# Patient Record
Sex: Male | Born: 1965 | Race: White | Hispanic: No | Smoking: Current every day smoker
Health system: Southern US, Community
[De-identification: ages and names within clinical notes are randomized; demographics above are authoritative.]

## PROBLEM LIST (undated history)

## (undated) DIAGNOSIS — I214 Non-ST elevation (NSTEMI) myocardial infarction: Secondary | ICD-10-CM

## (undated) DIAGNOSIS — Z720 Tobacco use: Secondary | ICD-10-CM

## (undated) DIAGNOSIS — I2510 Atherosclerotic heart disease of native coronary artery without angina pectoris: Secondary | ICD-10-CM

## (undated) DIAGNOSIS — K219 Gastro-esophageal reflux disease without esophagitis: Secondary | ICD-10-CM

## (undated) DIAGNOSIS — K859 Acute pancreatitis without necrosis or infection, unspecified: Secondary | ICD-10-CM

## (undated) DIAGNOSIS — Z8719 Personal history of other diseases of the digestive system: Secondary | ICD-10-CM

## (undated) DIAGNOSIS — F4310 Post-traumatic stress disorder, unspecified: Secondary | ICD-10-CM

## (undated) DIAGNOSIS — I209 Angina pectoris, unspecified: Secondary | ICD-10-CM

---

## 2006-08-04 ENCOUNTER — Emergency Department (HOSPITAL_COMMUNITY): Admission: EM | Admit: 2006-08-04 | Discharge: 2006-08-04 | Payer: Self-pay | Admitting: Emergency Medicine

## 2009-06-21 ENCOUNTER — Emergency Department (HOSPITAL_COMMUNITY): Admission: EM | Admit: 2009-06-21 | Discharge: 2009-06-21 | Payer: Self-pay | Admitting: Emergency Medicine

## 2009-10-30 ENCOUNTER — Emergency Department (HOSPITAL_COMMUNITY): Admission: EM | Admit: 2009-10-30 | Discharge: 2009-10-30 | Payer: Self-pay | Admitting: Emergency Medicine

## 2010-05-26 LAB — CBC
HCT: 49.3 % (ref 39.0–52.0)
Hemoglobin: 17.2 g/dL — ABNORMAL HIGH (ref 13.0–17.0)
MCV: 100.1 fL — ABNORMAL HIGH (ref 78.0–100.0)
RBC: 4.92 MIL/uL (ref 4.22–5.81)
RDW: 13.2 % (ref 11.5–15.5)
WBC: 7.3 10*3/uL (ref 4.0–10.5)

## 2010-05-26 LAB — COMPREHENSIVE METABOLIC PANEL
ALT: 29 U/L (ref 0–53)
Alkaline Phosphatase: 78 U/L (ref 39–117)
BUN: 4 mg/dL — ABNORMAL LOW (ref 6–23)
CO2: 26 mEq/L (ref 19–32)
Chloride: 107 mEq/L (ref 96–112)
GFR calc non Af Amer: >60 mL/min (ref >60)
Glucose, Bld: 104 mg/dL — ABNORMAL HIGH (ref 70–99)
Potassium: 3.8 mEq/L (ref 3.5–5.1)
Sodium: 141 mEq/L (ref 135–145)
Total Bilirubin: 0.7 mg/dL (ref 0.3–1.2)
Total Protein: 6 g/dL (ref 6.0–8.3)

## 2010-05-26 LAB — PROTIME-INR
INR: 0.89 (ref 0.00–1.49)
Prothrombin Time: 12.2 seconds (ref 11.6–15.2)

## 2010-05-31 LAB — POCT I-STAT, CHEM 8
BUN: <3 mg/dL — ABNORMAL LOW (ref 6–23)
Calcium, Ion: 1 mmol/L — ABNORMAL LOW (ref 1.12–1.32)
Chloride: 106 mEq/L (ref 96–112)
HCT: 58 % — ABNORMAL HIGH (ref 39.0–52.0)
Sodium: 141 mEq/L (ref 135–145)

## 2010-05-31 LAB — RAPID URINE DRUG SCREEN, HOSP PERFORMED
Amphetamines: NOT DETECTED
Opiates: NOT DETECTED
Tetrahydrocannabinol: POSITIVE — AB

## 2010-05-31 LAB — ETHANOL: Alcohol, Ethyl (B): 120 mg/dL — ABNORMAL HIGH (ref 0–10)

## 2012-12-10 ENCOUNTER — Emergency Department (HOSPITAL_COMMUNITY): Payer: Self-pay

## 2012-12-10 ENCOUNTER — Encounter (HOSPITAL_COMMUNITY): Payer: Self-pay | Admitting: Emergency Medicine

## 2012-12-10 ENCOUNTER — Emergency Department (HOSPITAL_COMMUNITY)
Admission: EM | Admit: 2012-12-10 | Discharge: 2012-12-11 | Disposition: A | Discharge status: Home / Self Care | Payer: Self-pay | Attending: Emergency Medicine | Admitting: Emergency Medicine

## 2012-12-10 DIAGNOSIS — K089 Disorder of teeth and supporting structures, unspecified: Secondary | ICD-10-CM | POA: Insufficient documentation

## 2012-12-10 DIAGNOSIS — K0889 Other specified disorders of teeth and supporting structures: Secondary | ICD-10-CM

## 2012-12-10 DIAGNOSIS — F17200 Nicotine dependence, unspecified, uncomplicated: Secondary | ICD-10-CM | POA: Insufficient documentation

## 2012-12-10 DIAGNOSIS — R21 Rash and other nonspecific skin eruption: Secondary | ICD-10-CM | POA: Insufficient documentation

## 2012-12-10 DIAGNOSIS — L0201 Cutaneous abscess of face: Secondary | ICD-10-CM | POA: Insufficient documentation

## 2012-12-10 DIAGNOSIS — R000 Tachycardia, unspecified: Secondary | ICD-10-CM | POA: Insufficient documentation

## 2012-12-10 DIAGNOSIS — L03211 Cellulitis of face: Secondary | ICD-10-CM | POA: Insufficient documentation

## 2012-12-10 DIAGNOSIS — R220 Localized swelling, mass and lump, head: Secondary | ICD-10-CM | POA: Insufficient documentation

## 2012-12-10 LAB — POCT I-STAT, CHEM 8
Chloride: 100 mEq/L (ref 96–112)
HCT: 55 % — ABNORMAL HIGH (ref 39.0–52.0)
Hemoglobin: 18.7 g/dL — ABNORMAL HIGH (ref 13.0–17.0)
Potassium: 3.8 mEq/L (ref 3.5–5.1)
Sodium: 138 mEq/L (ref 135–145)

## 2012-12-10 LAB — CBC WITH DIFFERENTIAL/PLATELET
Basophils Relative: 0 % (ref 0–1)
Eosinophils Absolute: 0.2 10*3/uL (ref 0.0–0.7)
Lymphs Abs: 1.6 10*3/uL (ref 0.7–4.0)
MCH: 33.7 pg (ref 26.0–34.0)
MCHC: 35 g/dL (ref 30.0–36.0)
Neutro Abs: 8.2 10*3/uL — ABNORMAL HIGH (ref 1.7–7.7)
Neutrophils Relative %: 71 % (ref 43–77)
Platelets: 236 10*3/uL (ref 150–400)
RBC: 4.92 MIL/uL (ref 4.22–5.81)

## 2012-12-10 MED ORDER — CLINDAMYCIN PHOSPHATE 600 MG/50ML IV SOLN
600.0000 mg | Freq: Once | INTRAVENOUS | Status: AC
  Administered 2012-12-10: 600 mg via INTRAVENOUS
  Filled 2012-12-10: qty 50

## 2012-12-10 MED ORDER — MORPHINE SULFATE 4 MG/ML IJ SOLN
4.0000 mg | Freq: Once | INTRAMUSCULAR | Status: AC
  Administered 2012-12-10: 4 mg via INTRAVENOUS
  Filled 2012-12-10: qty 1

## 2012-12-10 MED ORDER — IOHEXOL 300 MG/ML  SOLN
100.0000 mL | Freq: Once | INTRAMUSCULAR | Status: AC | PRN
  Administered 2012-12-10: 100 mL via INTRAVENOUS

## 2012-12-10 NOTE — ED Notes (Signed)
Pt has tooth on L upper side that is abcessed. Has an appt with dentist in the morning but face is grossly swollen on entire L side.

## 2012-12-10 NOTE — ED Provider Notes (Signed)
CSN: [id number]     Arrival date & time 12/10/12  1855 History   First MD Initiated Contact with Patient 12/10/12 2136     Chief Complaint  Patient presents with  . Facial Swelling  . Dental Pain   (Consider location/radiation/quality/duration/timing/severity/associated sxs/prior Treatment) HPI  47 year old male presents complaining of dental pain and facial swelling. Patient reports dental pain to his left upper central incisor ongoing for about a week. Pain is a sharp throbbing sensation, non radiating, worsening with chewing. He has been trying to take Advil initially with some relief. He is scheduled to followup with a dentist tomorrow. However within the past 24 hours he has had significant swelling to his left side of face extending towards his left eye. Patient currently states his dental pain is minimal, rated as 5/10, however his face is tight and mildly itchy.   Pt report he has tried home remedy of applying liquid advil onto Qtip and rub against affected tooth.  Initially it helped alleviate the pain but now it doesn't.  Patient otherwise denies fever, eye pain, ear pain, sore throat, neck pain. No other complaints. Patient is a smoker.  History reviewed. No pertinent past medical history. History reviewed. No pertinent past surgical history. No family history on file. History  Substance Use Topics  . Smoking status: Current Every Day Smoker -- 0.50 packs/day    Types: Cigarettes  . Smokeless tobacco: Not on file  . Alcohol Use: Yes     Comment: 3 beers/day    Review of Systems  Constitutional: Negative for fever.  HENT: Positive for dental problem. Negative for ear pain, trouble swallowing and neck pain.   Eyes: Negative for pain.  Respiratory: Negative for chest tightness and shortness of breath.   Skin: Positive for rash.  Neurological: Negative for headaches.    Allergies  Review of patient's allergies indicates no known allergies.  Home Medications  No current  outpatient prescriptions on file. BP 140/99  Pulse 113  Temp(Src) 99.2 F (37.3 C)  SpO2 100% Physical Exam  Nursing note and vitals reviewed. Constitutional: He appears well-developed and well-nourished. No distress.  HENT:  Patient with moderate left-sided facial swelling with significant left infraorbital edema, mildly tender to palpation.  On oral exam, patient has moderate dental decay with significant decay to left upper central incisor, tenderness to palpation but no obvious Adjacent abscess. No trismus. No tongue swelling or airway compromise. No evidence of peritonsillar abscess.  Neck: Normal range of motion. Neck supple. No JVD present.  Cardiovascular:  Mild tachycardia without murmurs, rubs, or gallops noted    ED Course  Procedures (including critical care time)  12:19 AM Pt with dental pain and now L facial swelling.  Maxillofacial CT show evidence of cellulitis without abscess.  No eye involvement, airway is intact.  Pt is scheduled to f/u with dentist tomorrow.  IV clindamycin given here.  Will discharge with pain medication and abx.  Pt to return in 48 hrs for skin recheck, return sooner if sxs worsen.  Otherwise pt stable for discharge.  Pt agrees with plan.  Care discussed with attending.    Labs Review Labs Reviewed  CBC WITH DIFFERENTIAL - Abnormal; Notable for the following:    WBC 11.5 (*)    Neutro Abs 8.2 (*)    Monocytes Absolute 1.4 (*)    All other components within normal limits  POCT I-STAT, CHEM 8 - Abnormal; Notable for the following:    Hemoglobin 18.7 (*)  HCT 55.0 (*)    All other components within normal limits   Imaging Review Ct Maxillofacial W/cm  12/10/2012   *RADIOLOGY REPORT*  Clinical Data: Facial swelling on the left  CT MAXILLOFACIAL WITH CONTRAST  Technique:  Multidetector CT imaging of the maxillofacial structures was performed with intravenous contrast. Multiplanar CT image reconstructions were also generated.  Contrast:  OMNIPAQUE IOHEXOL 300 MG/ML  SOLN  Comparison:  None available.  Findings: Multiple dental caries are noted.  There is diffuse soft tissue swelling throughout the left face with inflammatory fat stranding within the left masticator space, suggestive of infection/inflammation. Swelling extends into the left periorbital region.  No loculated collection to suggest abscess identified. The oral cavity is within normal limits without loculated fluid collection or mass lesion.  Palatine tonsils are within normal limits.  Parapharyngeal fat is preserved.  Airway is midline and patent.  No pathologically enlarged lymph nodes are identified within the neck.  No maxillofacial fracture identified.  Orbits are within normal limits.  Mild circumferential polypoid opacity is present within the left maxillary sinus.  Otherwise, paranasal sinuses are clear.  There is a small left mastoid effusion.  IMPRESSION: 1.  Dental caries with left facial swelling, suggestive of cellulitis.  No loculated rim enhancing fluid collections identified to suggest abscess formation. 2.  Mild left maxillary sinus disease with small left mastoid effusion.  This is likely reactive to the left facial inflammatory process.   Original Report Authenticated By: Rise Mu, M.D.    MDM   1. Diffuse cellulitis of face   2. Pain, dental    BP 150/105  Pulse 90  Temp(Src) 98 F (36.7 C) (Oral)  Resp 20  SpO2 98%  I have reviewed nursing notes and vital signs. I personally reviewed the imaging tests through PACS system  I reviewed available ER/hospitalization records thought the EMR     Fayrene Helper, PA-C 12/11/12 0025

## 2012-12-11 MED ORDER — CLINDAMYCIN HCL 150 MG PO CAPS: 150.0000 mg | ORAL_CAPSULE | Freq: Four times a day (QID) | ORAL | Status: DC

## 2012-12-11 MED ORDER — HYDROCODONE-ACETAMINOPHEN 5-325 MG PO TABS: 2.0000 | ORAL_TABLET | ORAL | Status: DC | PRN

## 2012-12-11 NOTE — ED Provider Notes (Signed)
Medical screening examination/treatment/procedure(s) were performed by non-physician practitioner and as supervising physician I was immediately available for consultation/collaboration.  Gerhard Munch, MD 12/11/12 [phone]

## 2013-01-15 ENCOUNTER — Emergency Department (HOSPITAL_COMMUNITY)
Admission: EM | Admit: 2013-01-15 | Discharge: 2013-01-15 | Disposition: A | Discharge status: Home / Self Care | Payer: Self-pay | Attending: Emergency Medicine | Admitting: Emergency Medicine

## 2013-01-15 ENCOUNTER — Encounter (HOSPITAL_COMMUNITY): Payer: Self-pay | Admitting: Emergency Medicine

## 2013-01-15 DIAGNOSIS — R197 Diarrhea, unspecified: Secondary | ICD-10-CM | POA: Insufficient documentation

## 2013-01-15 DIAGNOSIS — K644 Residual hemorrhoidal skin tags: Secondary | ICD-10-CM | POA: Insufficient documentation

## 2013-01-15 DIAGNOSIS — Z79899 Other long term (current) drug therapy: Secondary | ICD-10-CM | POA: Insufficient documentation

## 2013-01-15 DIAGNOSIS — F17200 Nicotine dependence, unspecified, uncomplicated: Secondary | ICD-10-CM | POA: Insufficient documentation

## 2013-01-15 DIAGNOSIS — R109 Unspecified abdominal pain: Secondary | ICD-10-CM | POA: Insufficient documentation

## 2013-01-15 DIAGNOSIS — R112 Nausea with vomiting, unspecified: Secondary | ICD-10-CM | POA: Insufficient documentation

## 2013-01-15 LAB — COMPREHENSIVE METABOLIC PANEL
Albumin: 4.5 g/dL (ref 3.5–5.2)
Alkaline Phosphatase: 120 U/L — ABNORMAL HIGH (ref 39–117)
BUN: 11 mg/dL (ref 6–23)
CO2: 20 mEq/L (ref 19–32)
Chloride: 99 mEq/L (ref 96–112)
Glucose, Bld: 121 mg/dL — ABNORMAL HIGH (ref 70–99)
Potassium: 4.8 mEq/L (ref 3.5–5.1)
Total Bilirubin: 0.4 mg/dL (ref 0.3–1.2)

## 2013-01-15 LAB — URINALYSIS, ROUTINE W REFLEX MICROSCOPIC
Ketones, ur: 15 mg/dL — AB
Nitrite: NEGATIVE
pH: 5.5 (ref 5.0–8.0)

## 2013-01-15 LAB — LIPASE, BLOOD: Lipase: 27 U/L (ref 11–59)

## 2013-01-15 LAB — CBC WITH DIFFERENTIAL/PLATELET
Basophils Relative: 1 % (ref 0–1)
Hemoglobin: 19.9 g/dL — ABNORMAL HIGH (ref 13.0–17.0)
Lymphocytes Relative: 11 % — ABNORMAL LOW (ref 12–46)
Lymphs Abs: 1.4 10*3/uL (ref 0.7–4.0)
Monocytes Relative: 7 % (ref 3–12)
Neutro Abs: 10.1 10*3/uL — ABNORMAL HIGH (ref 1.7–7.7)
Neutrophils Relative %: 81 % — ABNORMAL HIGH (ref 43–77)
RBC: 5.77 MIL/uL (ref 4.22–5.81)
WBC: 12.5 10*3/uL — ABNORMAL HIGH (ref 4.0–10.5)

## 2013-01-15 LAB — URINE MICROSCOPIC-ADD ON

## 2013-01-15 MED ORDER — ONDANSETRON HCL 4 MG/2ML IJ SOLN
4.0000 mg | INTRAMUSCULAR | Status: AC
  Administered 2013-01-15: 4 mg via INTRAVENOUS
  Filled 2013-01-15: qty 2

## 2013-01-15 MED ORDER — ONDANSETRON HCL 4 MG/2ML IJ SOLN
4.0000 mg | Freq: Once | INTRAMUSCULAR | Status: AC
  Administered 2013-01-15: 4 mg via INTRAVENOUS
  Filled 2013-01-15: qty 2

## 2013-01-15 MED ORDER — SODIUM CHLORIDE 0.9 % IV BOLUS (SEPSIS)
1000.0000 mL | Freq: Once | INTRAVENOUS | Status: AC
  Administered 2013-01-15: 1000 mL via INTRAVENOUS

## 2013-01-15 MED ORDER — ONDANSETRON 4 MG PO TBDP: 4.0000 mg | ORAL_TABLET | Freq: Three times a day (TID) | ORAL | Status: DC | PRN

## 2013-01-15 MED ORDER — DICYCLOMINE HCL 20 MG PO TABS: 20.0000 mg | ORAL_TABLET | Freq: Two times a day (BID) | ORAL | Status: DC

## 2013-01-15 MED ORDER — MORPHINE SULFATE 4 MG/ML IJ SOLN
4.0000 mg | Freq: Once | INTRAMUSCULAR | Status: AC
  Administered 2013-01-15: 4 mg via INTRAVENOUS
  Filled 2013-01-15: qty 1

## 2013-01-15 NOTE — ED Notes (Signed)
Pt reports woke up this morning, vomiting phlem, had a bowel movement which he thinks had blood in it, very watery. Abdominal pain 7/10. Reports drinks 2-3 beers ETOH daily x20 years. Smokes 1 pack/day.

## 2013-01-15 NOTE — ED Provider Notes (Signed)
Medical screening examination/treatment/procedure(s) were performed by non-physician practitioner and as supervising physician I was immediately available for consultation/collaboration.  EKG Interpretation   None         Junius Argyle, MD 01/15/13 1525

## 2013-01-15 NOTE — ED Notes (Signed)
Pt tolerated fluids without difficulty 

## 2013-01-15 NOTE — ED Notes (Signed)
Pt given urinal and made aware of need for UA instructed to call when able to void

## 2013-01-15 NOTE — Progress Notes (Signed)
[facility] provided pt with a list of primary care resources.  °

## 2013-01-15 NOTE — ED Provider Notes (Signed)
CSN: [id number]     Arrival date & time 01/15/13  1012 History   First MD Initiated Contact with Patient 01/15/13 1029     Chief Complaint  Patient presents with  . GI Bleeding  . Emesis   (Consider location/radiation/quality/duration/timing/severity/associated sxs/prior Treatment) The history is provided by the patient and medical records.   This is a 47 year old male with no significant past history, presenting to the ED for vomiting and possible GI bleed. Patient states he woke up this morning had one episode of nonbloody, nonbilious emesis. He then had a watery bowel movement which he thinks had some blood in it. He denies any gross blood.  Has had intermittent loose stools over the past 2 weeks but today was the first watery stool.  Pt was recently on clindamycin for dental abscess-- states loose stools started around time he started taking abx.  Has been off meds for approx 1 week but continues to have loose stools. No prior hx of GI bleed.  Not currently on any anticoagulants.  No recent travel.  States he still feels like he needs to have a bowel movement and had some lower abdominal cramping. Pt does admit to daily EtOH use for the past 20 years, approx 2-3 beers daily.  Pt is a daily smoker, approx 1 PPD.    History reviewed. No pertinent past medical history. History reviewed. No pertinent past surgical history. History reviewed. No pertinent family history. History  Substance Use Topics  . Smoking status: Current Every Day Smoker -- 1.00 packs/day for 25 years    Types: Cigarettes  . Smokeless tobacco: Not on file  . Alcohol Use: Yes     Comment: 3 beers/day    Review of Systems  Gastrointestinal: Positive for vomiting and diarrhea.  All other systems reviewed and are negative.    Allergies  Review of patient's allergies indicates no known allergies.  Home Medications   Current Outpatient Rx  Name  Route  Sig  Dispense  Refill  . Aspirin-Acetaminophen-Caffeine (GOODY  HEADACHE PO)   Oral   Take 1 application by mouth every 6 (six) hours as needed (pain).         . clindamycin (CLEOCIN) 150 MG capsule   Oral   Take 1 capsule (150 mg total) by mouth every 6 (six) hours.   28 capsule   0   . HYDROcodone-acetaminophen (NORCO/VICODIN) 5-325 MG per tablet   Oral   Take 2 tablets by mouth every 4 (four) hours as needed for pain.   16 tablet   0   . naproxen sodium (ANAPROX) 220 MG tablet   Oral   Take 220 mg by mouth 2 (two) times daily with a meal.          BP 145/113  Pulse 134  Temp(Src) 98.1 F (36.7 C) (Oral)  Resp 16  SpO2 95%  Physical Exam  Nursing note and vitals reviewed. Constitutional: He is oriented to person, place, and time. He appears well-developed and well-nourished. No distress.  HENT:  Head: Normocephalic and atraumatic.  Mouth/Throat: Uvula is midline, oropharynx is clear and moist and mucous membranes are normal. No oropharyngeal exudate, posterior oropharyngeal edema, posterior oropharyngeal erythema or tonsillar abscesses.  Mildly dry mucus membranes  Eyes: Conjunctivae and EOM are normal. Pupils are equal, round, and reactive to light.  Neck: Normal range of motion. Neck supple.  Cardiovascular: Normal rate, regular rhythm and normal heart sounds.   Pulmonary/Chest: Effort normal and breath sounds normal. No respiratory  distress. He has no wheezes.  Abdominal: Soft. Bowel sounds are normal. There is no tenderness. There is no guarding and no CVA tenderness.  Abdomen soft, non-distended, cramping sensation but no focal TTP  Genitourinary: Rectal exam shows external hemorrhoid. Rectal exam shows no internal hemorrhoid, no fissure, no mass and no tenderness. Guaiac negative stool.  2 non-bleeding, non-thrombosed external hemorrhoids; no gross blood on DRE; FOBT negative  Musculoskeletal: Normal range of motion.  Neurological: He is alert and oriented to person, place, and time.  Skin: Skin is warm and dry. He is not  diaphoretic.  Psychiatric: He has a normal mood and affect.    ED Course  Procedures (including critical care time)  Labs Review Labs Reviewed  CBC WITH DIFFERENTIAL - Abnormal; Notable for the following:    WBC 12.5 (*)    Hemoglobin 19.9 (*)    HCT 55.5 (*)    MCH 34.5 (*)    Neutrophils Relative % 81 (*)    Neutro Abs 10.1 (*)    Lymphocytes Relative 11 (*)    All other components within normal limits  COMPREHENSIVE METABOLIC PANEL - Abnormal; Notable for the following:    Sodium 133 (*)    Glucose, Bld 121 (*)    Alkaline Phosphatase 120 (*)    GFR calc non Af Amer 72 (*)    GFR calc Af Amer 84 (*)    All other components within normal limits  URINALYSIS, ROUTINE W REFLEX MICROSCOPIC - Abnormal; Notable for the following:    Color, Urine AMBER (*)    Specific Gravity, Urine 1.035 (*)    Bilirubin Urine SMALL (*)    Ketones, ur 15 (*)    Protein, ur 30 (*)    All other components within normal limits  URINE MICROSCOPIC-ADD ON - Abnormal; Notable for the following:    Casts HYALINE CASTS (*)    All other components within normal limits  LIPASE, BLOOD  OCCULT BLOOD X 1 CARD TO LAB, STOOL  OCCULT BLOOD, POC DEVICE   Imaging Review No results found.  EKG Interpretation   None       MDM   1. Diarrhea   2. Nausea and vomiting   3. Abdominal cramping    Pt had additional BM on arrival which nursing staff observed-- no gross blood.  On initial evaluation pt is overall well appearing, abdomen soft, non-distended, no focal TTP.  FOBT negative.  Other labs pending at this time.  Will give IVFB, pain and nausea meds and reassess.  12:58 PM Labs as above.  Pts abdominal cramping has resolved but still feels somewhat nauseated.  Will give additional meds and initiate PO challenge.  1:31 PM Nausea resolved.  Pt tolerating PO without recurrent vomiting.  Pt afebrile, non-toxic appearing, NAD, VS stable- ok for discharge.  Rx bentyl and zofran.  Has been unable to  provide stool sample for C. Diff screen and culture -- will send supplies for home completion if symptoms persist.  Discussed plan with pt, he agreed.  Strict return precautions advised should sx continue or worsen.  Discussed with Dr. Romeo Apple who agrees with assessment and plan of care.  Garlon Hatchet, PA-C 01/15/13 1439

## 2014-01-10 DIAGNOSIS — I214 Non-ST elevation (NSTEMI) myocardial infarction: Secondary | ICD-10-CM

## 2014-01-10 HISTORY — DX: Non-ST elevation (NSTEMI) myocardial infarction: I21.4

## 2014-01-17 ENCOUNTER — Inpatient Hospital Stay (HOSPITAL_COMMUNITY)
Admission: EM | Admit: 2014-01-17 | Discharge: 2014-01-19 | DRG: 247 | Disposition: A | Discharge status: Home / Self Care | Payer: Self-pay | Attending: Cardiology | Admitting: Cardiology

## 2014-01-17 DIAGNOSIS — I200 Unstable angina: Secondary | ICD-10-CM | POA: Diagnosis present

## 2014-01-17 DIAGNOSIS — Z79891 Long term (current) use of opiate analgesic: Secondary | ICD-10-CM

## 2014-01-17 DIAGNOSIS — F17210 Nicotine dependence, cigarettes, uncomplicated: Secondary | ICD-10-CM | POA: Diagnosis present

## 2014-01-17 DIAGNOSIS — I214 Non-ST elevation (NSTEMI) myocardial infarction: Secondary | ICD-10-CM

## 2014-01-17 DIAGNOSIS — I209 Angina pectoris, unspecified: Secondary | ICD-10-CM

## 2014-01-17 DIAGNOSIS — R079 Chest pain, unspecified: Secondary | ICD-10-CM

## 2014-01-17 DIAGNOSIS — R030 Elevated blood-pressure reading, without diagnosis of hypertension: Secondary | ICD-10-CM | POA: Diagnosis present

## 2014-01-17 DIAGNOSIS — Z720 Tobacco use: Secondary | ICD-10-CM

## 2014-01-17 DIAGNOSIS — I25110 Atherosclerotic heart disease of native coronary artery with unstable angina pectoris: Principal | ICD-10-CM | POA: Diagnosis present

## 2014-01-17 DIAGNOSIS — Z79899 Other long term (current) drug therapy: Secondary | ICD-10-CM

## 2014-01-17 DIAGNOSIS — Z7982 Long term (current) use of aspirin: Secondary | ICD-10-CM

## 2014-01-17 HISTORY — DX: Gastro-esophageal reflux disease without esophagitis: K21.9

## 2014-01-17 HISTORY — DX: Tobacco use: Z72.0

## 2014-01-17 HISTORY — DX: Atherosclerotic heart disease of native coronary artery without angina pectoris: I25.10

## 2014-01-17 HISTORY — DX: Non-ST elevation (NSTEMI) myocardial infarction: I21.4

## 2014-01-17 HISTORY — DX: Angina pectoris, unspecified: I20.9

## 2014-01-18 ENCOUNTER — Emergency Department (HOSPITAL_COMMUNITY): Payer: Self-pay

## 2014-01-18 ENCOUNTER — Encounter (HOSPITAL_COMMUNITY)
Admission: EM | Disposition: A | Discharge status: Home / Self Care | Payer: MEDICAID | Source: Home / Self Care | Attending: Cardiology

## 2014-01-18 ENCOUNTER — Encounter (HOSPITAL_COMMUNITY): Payer: Self-pay | Admitting: Emergency Medicine

## 2014-01-18 DIAGNOSIS — I2510 Atherosclerotic heart disease of native coronary artery without angina pectoris: Secondary | ICD-10-CM

## 2014-01-18 DIAGNOSIS — I214 Non-ST elevation (NSTEMI) myocardial infarction: Secondary | ICD-10-CM

## 2014-01-18 DIAGNOSIS — Z720 Tobacco use: Secondary | ICD-10-CM | POA: Diagnosis present

## 2014-01-18 DIAGNOSIS — R7989 Other specified abnormal findings of blood chemistry: Secondary | ICD-10-CM

## 2014-01-18 DIAGNOSIS — I200 Unstable angina: Secondary | ICD-10-CM

## 2014-01-18 HISTORY — PX: CORONARY ANGIOPLASTY WITH STENT PLACEMENT: SHX49

## 2014-01-18 HISTORY — PX: LEFT HEART CATHETERIZATION WITH CORONARY ANGIOGRAM: SHX5451

## 2014-01-18 LAB — COMPREHENSIVE METABOLIC PANEL
ALK PHOS: 113 U/L (ref 39–117)
ALT: 16 U/L (ref 0–53)
ANION GAP: 13 (ref 5–15)
AST: 24 U/L (ref 0–37)
Albumin: 3.3 g/dL — ABNORMAL LOW (ref 3.5–5.2)
BILIRUBIN TOTAL: 0.4 mg/dL (ref 0.3–1.2)
BUN: 8 mg/dL (ref 6–23)
CHLORIDE: 102 meq/L (ref 96–112)
CO2: 24 mEq/L (ref 19–32)
Calcium: 8.8 mg/dL (ref 8.4–10.5)
Creatinine, Ser: 0.97 mg/dL (ref 0.50–1.35)
GFR calc non Af Amer: >90 mL/min (ref >90)
GLUCOSE: 105 mg/dL — AB (ref 70–99)
POTASSIUM: 4.4 meq/L (ref 3.7–5.3)
Sodium: 139 mEq/L (ref 137–147)
Total Protein: 6.3 g/dL (ref 6.0–8.3)

## 2014-01-18 LAB — BASIC METABOLIC PANEL
Anion gap: 10 (ref 5–15)
BUN: 8 mg/dL (ref 6–23)
CALCIUM: 9.1 mg/dL (ref 8.4–10.5)
CO2: 26 mEq/L (ref 19–32)
CREATININE: 1.05 mg/dL (ref 0.50–1.35)
Chloride: 103 mEq/L (ref 96–112)
GFR calc Af Amer: >90 mL/min (ref >90)
GFR, EST NON AFRICAN AMERICAN: 83 mL/min — AB (ref >90)
Glucose, Bld: 105 mg/dL — ABNORMAL HIGH (ref 70–99)
Potassium: 4.8 mEq/L (ref 3.7–5.3)
Sodium: 139 mEq/L (ref 137–147)

## 2014-01-18 LAB — I-STAT TROPONIN, ED: Troponin i, poc: 0.12 ng/mL (ref 0.00–0.08)

## 2014-01-18 LAB — LIPID PANEL
Cholesterol: 154 mg/dL (ref 0–200)
HDL: 45 mg/dL (ref >39)
LDL Cholesterol: 94 mg/dL (ref 0–99)
Total CHOL/HDL Ratio: 3.4 RATIO
Triglycerides: 73 mg/dL (ref <150)
VLDL: 15 mg/dL (ref 0–40)

## 2014-01-18 LAB — HEPARIN LEVEL (UNFRACTIONATED)
Heparin Unfractionated: 0.24 IU/mL — ABNORMAL LOW (ref 0.30–0.70)
Heparin Unfractionated: 0.54 IU/mL (ref 0.30–0.70)

## 2014-01-18 LAB — CBC
HEMATOCRIT: 50.9 % (ref 39.0–52.0)
Hemoglobin: 17.2 g/dL — ABNORMAL HIGH (ref 13.0–17.0)
MCH: 33.6 pg (ref 26.0–34.0)
MCHC: 33.8 g/dL (ref 30.0–36.0)
MCV: 99.4 fL (ref 78.0–100.0)
Platelets: 218 10*3/uL (ref 150–400)
RBC: 5.12 MIL/uL (ref 4.22–5.81)
RDW: 12.5 % (ref 11.5–15.5)
WBC: 8.3 10*3/uL (ref 4.0–10.5)

## 2014-01-18 LAB — PROTIME-INR
INR: 0.98 (ref 0.00–1.49)
Prothrombin Time: 13.1 seconds (ref 11.6–15.2)

## 2014-01-18 LAB — TROPONIN I
TROPONIN I: 0.34 ng/mL — AB (ref <0.30)
Troponin I: <0.3 ng/mL (ref <0.30)
Troponin I: <0.3 ng/mL (ref <0.30)

## 2014-01-18 LAB — TSH: TSH: 1.85 u[IU]/mL (ref 0.350–4.500)

## 2014-01-18 SURGERY — LEFT HEART CATHETERIZATION WITH CORONARY ANGIOGRAM
Anesthesia: LOCAL

## 2014-01-18 MED ORDER — ASPIRIN 81 MG PO CHEW
324.0000 mg | CHEWABLE_TABLET | ORAL | Status: AC
  Administered 2014-01-18: 324 mg via ORAL
  Filled 2014-01-18: qty 4

## 2014-01-18 MED ORDER — SODIUM CHLORIDE 0.9 % IJ SOLN: 3.0000 mL | Freq: Two times a day (BID) | INTRAMUSCULAR | Status: DC

## 2014-01-18 MED ORDER — HEPARIN BOLUS VIA INFUSION
1000.0000 [IU] | Freq: Once | INTRAVENOUS | Status: AC
  Administered 2014-01-18: 1000 [IU] via INTRAVENOUS
  Filled 2014-01-18: qty 1000

## 2014-01-18 MED ORDER — SODIUM CHLORIDE 0.9 % IJ SOLN: 3.0000 mL | INTRAMUSCULAR | Status: DC | PRN

## 2014-01-18 MED ORDER — VERAPAMIL HCL 2.5 MG/ML IV SOLN
INTRAVENOUS | Status: AC
  Filled 2014-01-18: qty 2

## 2014-01-18 MED ORDER — TICAGRELOR 90 MG PO TABS
ORAL_TABLET | ORAL | Status: AC
  Filled 2014-01-18: qty 2

## 2014-01-18 MED ORDER — HEPARIN SODIUM (PORCINE) 1000 UNIT/ML IJ SOLN
INTRAMUSCULAR | Status: AC
  Filled 2014-01-18: qty 1

## 2014-01-18 MED ORDER — METOPROLOL TARTRATE 25 MG PO TABS
25.0000 mg | ORAL_TABLET | Freq: Two times a day (BID) | ORAL | Status: DC
  Administered 2014-01-18 – 2014-01-19 (×3): 25 mg via ORAL
  Filled 2014-01-18 (×5): qty 1

## 2014-01-18 MED ORDER — LIDOCAINE HCL (PF) 1 % IJ SOLN
INTRAMUSCULAR | Status: AC
  Filled 2014-01-18: qty 30

## 2014-01-18 MED ORDER — SODIUM CHLORIDE 0.9 % IV SOLN: 250.0000 mL | INTRAVENOUS | Status: DC | PRN

## 2014-01-18 MED ORDER — BIVALIRUDIN 250 MG IV SOLR
INTRAVENOUS | Status: AC
  Filled 2014-01-18: qty 250

## 2014-01-18 MED ORDER — HEPARIN BOLUS VIA INFUSION
3000.0000 [IU] | Freq: Once | INTRAVENOUS | Status: AC
  Administered 2014-01-18: 3000 [IU] via INTRAVENOUS
  Filled 2014-01-18: qty 3000

## 2014-01-18 MED ORDER — SODIUM CHLORIDE 0.9 % IV SOLN
INTRAVENOUS | Status: AC
Start: 2014-01-18 — End: 2014-01-18
  Administered 2014-01-18: 17:00:00 via INTRAVENOUS

## 2014-01-18 MED ORDER — FENTANYL CITRATE 0.05 MG/ML IJ SOLN
INTRAMUSCULAR | Status: AC
  Filled 2014-01-18: qty 2

## 2014-01-18 MED ORDER — NITROGLYCERIN 0.4 MG SL SUBL: 0.4000 mg | SUBLINGUAL_TABLET | SUBLINGUAL | Status: DC | PRN

## 2014-01-18 MED ORDER — NITROGLYCERIN 1 MG/10 ML FOR IR/CATH LAB
INTRA_ARTERIAL | Status: AC
  Filled 2014-01-18: qty 10

## 2014-01-18 MED ORDER — MIDAZOLAM HCL 2 MG/2ML IJ SOLN
INTRAMUSCULAR | Status: AC
  Filled 2014-01-18: qty 2

## 2014-01-18 MED ORDER — ONDANSETRON HCL 4 MG/2ML IJ SOLN
4.0000 mg | Freq: Four times a day (QID) | INTRAMUSCULAR | Status: DC | PRN
Start: 2014-01-18 — End: 2014-01-18

## 2014-01-18 MED ORDER — ASPIRIN 81 MG PO CHEW
81.0000 mg | CHEWABLE_TABLET | Freq: Every day | ORAL | Status: DC
  Administered 2014-01-19: 11:00:00 81 mg via ORAL
  Filled 2014-01-18 (×2): qty 1

## 2014-01-18 MED ORDER — ATORVASTATIN CALCIUM 80 MG PO TABS
80.0000 mg | ORAL_TABLET | Freq: Every day | ORAL | Status: DC
Start: 2014-01-18 — End: 2014-01-18
  Filled 2014-01-18: qty 1

## 2014-01-18 MED ORDER — ASPIRIN 81 MG PO CHEW
81.0000 mg | CHEWABLE_TABLET | ORAL | Status: DC
Start: 2014-01-19 — End: 2014-01-18

## 2014-01-18 MED ORDER — ACETAMINOPHEN 325 MG PO TABS: 650.0000 mg | ORAL_TABLET | ORAL | Status: DC | PRN

## 2014-01-18 MED ORDER — ACETAMINOPHEN 325 MG PO TABS
650.0000 mg | ORAL_TABLET | ORAL | Status: DC | PRN
Start: 2014-01-18 — End: 2014-01-19
  Administered 2014-01-18 – 2014-01-19 (×3): 650 mg via ORAL
  Filled 2014-01-18 (×3): qty 2

## 2014-01-18 MED ORDER — ONDANSETRON HCL 4 MG/2ML IJ SOLN: 4.0000 mg | Freq: Four times a day (QID) | INTRAMUSCULAR | Status: DC | PRN

## 2014-01-18 MED ORDER — SODIUM CHLORIDE 0.9 % IV SOLN: 1.0000 mL/kg/h | INTRAVENOUS | Status: DC

## 2014-01-18 MED ORDER — TICAGRELOR 90 MG PO TABS
90.0000 mg | ORAL_TABLET | Freq: Two times a day (BID) | ORAL | Status: DC
  Administered 2014-01-19: 05:00:00 90 mg via ORAL
  Filled 2014-01-18 (×3): qty 1

## 2014-01-18 MED ORDER — ASPIRIN 300 MG RE SUPP: 300.0000 mg | RECTAL | Status: AC

## 2014-01-18 MED ORDER — MORPHINE SULFATE 2 MG/ML IJ SOLN: 1.0000 mg | INTRAMUSCULAR | Status: DC | PRN

## 2014-01-18 MED ORDER — ATORVASTATIN CALCIUM 80 MG PO TABS: 80.0000 mg | ORAL_TABLET | Freq: Every day | ORAL | Status: DC

## 2014-01-18 MED ORDER — HEPARIN (PORCINE) IN NACL 2-0.9 UNIT/ML-% IJ SOLN
INTRAMUSCULAR | Status: AC
  Filled 2014-01-18: qty 1000

## 2014-01-18 MED ORDER — HEPARIN (PORCINE) IN NACL 100-0.45 UNIT/ML-% IJ SOLN
950.0000 [IU]/h | INTRAMUSCULAR | Status: DC
  Administered 2014-01-18: 800 [IU]/h via INTRAVENOUS
  Filled 2014-01-18 (×2): qty 250

## 2014-01-18 MED ORDER — ASPIRIN EC 81 MG PO TBEC: 81.0000 mg | DELAYED_RELEASE_TABLET | Freq: Every day | ORAL | Status: DC

## 2014-01-18 NOTE — Interval H&P Note (Signed)
Cath Lab Visit (complete for each Cath Lab visit)  Clinical Evaluation Leading to the Procedure:   ACS: Yes.    Non-ACS:    Anginal Classification: CCS IV  Anti-ischemic medical therapy: No Therapy  Non-Invasive Test Results: No non-invasive testing performed  Prior CABG: No previous CABG      History and Physical Interval Note:  01/18/2014 3:16 PM  Douglas [name]  has presented today for surgery, with the diagnosis of cp  The various methods of treatment have been discussed with the patient and family. After consideration of risks, benefits and other options for treatment, the patient has consented to  Procedure(s): LEFT HEART CATHETERIZATION WITH CORONARY ANGIOGRAM (N/A) as a surgical intervention .  The patient's history has been reviewed, patient examined, no change in status, stable for surgery.  I have reviewed the patient's chart and labs.  Questions were answered to the patient's satisfaction.     [name],[name]

## 2014-01-18 NOTE — CV Procedure (Signed)
Douglas [name] is a 48 y.o. male    [id number] LOCATION:  FACILITY: [facility]  PHYSICIAN: Quay Burow, M.D. [birth year]   DATE OF PROCEDURE:  01/18/2014  DATE OF DISCHARGE:     CARDIAC CATHETERIZATION     History obtained from chart review.[name] is a 48 year old gentleman with positive risk factors who has experienced several weeks of symptoms compatible with accelerated angina. He was seen by Dr. Wynonia Lawman in the emergency room last night. His point-of-care markers were positive though his troponins ended up being negative. There were no acute EKG changes. He was placed on IV heparin and nitroglycerin and presents now for diagnostic coronary arteriography to define his anatomy and rule out an ischemic etiology.   PROCEDURE DESCRIPTION:   The patient was brought to the second floor [facility] cath lab in the postabsorptive state. He was premedicated with Valium 5 mg by mouth, IV Versed and fentanyl. His right wristwas prepped and shaved in usual sterile fashion. Xylocaine 1% was used for local anesthesia. A 6 French sheath was inserted into the right radial artery using standard Seldinger technique. The patient received 4000 units  of heparin  intravenously.  A 5 [place] TIG catheter along with pigtail catheters were used for selective coronary angiography and left ventriculography respectively. Visipaque dye was used for the entirety of the case. Retrograde aortic, left ventricular and pullback pressures were recorded.    HEMODYNAMICS:    AO SYSTOLIC/AO DIASTOLIC: 503/88   LV SYSTOLIC/LV DIASTOLIC: 828/0  ANGIOGRAPHIC RESULTS:   1. Left main; normal  2. LAD; 50% segmental proximal at the takeoff of a moderate-sized diagonal branch. The diagonal branch had a 60% ostial stenosis followed by a 60% segmental proximal stenosis. 3. Left circumflex; nondominant and free of significant disease.  4. Right coronary artery; dominant with a 90% stenosis at the genue 5. Left  ventriculography; RAO left ventriculogram was performed using  25 mL of Visipaque dye at 12 mL/second. 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 We'll proceed with PCI and stenting using drug-eluting stent and Brilenta along with Angiomax.  Procedure description: The patient received Angiomax bolus with an ACT of 518. Total contrast administered to the patient was 150 mL. Using a 6 [place] JR4 guide catheter along with an 014/190 cm long pro-water guidewire and a 2 mm x 12 mm Euphora angioplasty balloon predilatation was performed. Following this a 3.5 mm x 18 mm long Xience drug-eluting stent was then deployed at 16 atm and postdilated with a 3.75 mm x 15 mm long noncompliant balloon at 16 atm (3.8 mm) resulting in reduction of a 90% stenosis in the dominant RCA at the January to 0% residual. 200 g of intra-arterial nitroglycerin was then administered and completion angiography was performed. The guidewire and catheter were removed, the sheath was removed and a TR band was placed on the right wrist to achieve patent hemostasis. Angina continue our distress for 4 hours. The patient will be discharged home in the morning.  Lorretta Harp MD, [facility] 01/18/2014 4:47 PM

## 2014-01-18 NOTE — Progress Notes (Signed)
ANTICOAGULATION CONSULT NOTE - Initial Consult  Pharmacy Consult for heparin Indication: USAP  No Known Allergies  Patient Measurements: Height: 5\' 10"  (177.8 cm) Weight: 145 lb (65.772 kg) IBW/kg (Calculated) : 73  Vital Signs: Temp: 98 F (36.7 C) (11/09 0008) Temp Source: Oral (11/09 0008) BP: 131/81 mmHg (11/09 0115) Pulse Rate: 77 (11/09 0115)  Labs:  Recent Labs  01/18/14 0011 01/18/14 0106  HGB 17.2*  --   HCT 50.9  --   PLT 218  --   CREATININE 1.05  --   TROPONINI  --  <0.30    Estimated Creatinine Clearance: 80.9 mL/min (by C-G formula based on Cr of 1.05).   Medical History: History reviewed. No pertinent past medical history.   Assessment: 48yo male c/o 3wk h/o increasing frequency and severity of CP radiating across chest and down both arms, leaving hands numb, being admitted for further evaluation of USAP, to begin heparin.  Goal of Therapy:  Heparin level 0.3-0.7 units/ml Monitor platelets by anticoagulation protocol: Yes   Plan:  Will give heparin 3000 units IV bolus x1 followed by gtt at 800 units/hr and monitor heparin levels and CBC.  Vernard GamblesVeronda Cristalle Rohm, PharmD, BCPS  01/18/2014,3:28 AM

## 2014-01-18 NOTE — Progress Notes (Signed)
ANTICOAGULATION CONSULT NOTE - Follow Up Consult  Pharmacy Consult for heparin Indication: USAP  No Known Allergies  Patient Measurements: Height: 5\' 10"  (177.8 cm) Weight: 145 lb (65.772 kg) IBW/kg (Calculated) : 73  Vital Signs: BP: 128/75 mmHg (11/09 1048) Pulse Rate: 79 (11/09 1048)  Labs:  Recent Labs  01/18/14 0011 01/18/14 0106 01/18/14 0609 01/18/14 1040  HGB 17.2*  --   --   --   HCT 50.9  --   --   --   PLT 218  --   --   --   HEPARINUNFRC  --   --   --  0.24*  CREATININE 1.05  --  0.97  --   TROPONINI  --  <0.30 0.34*  --     Estimated Creatinine Clearance: 87.6 mL/min (by C-G formula based on Cr of 0.97).   Medical History: Past Medical History  Diagnosis Date  . Tobacco abuse      Assessment: 48yo male c/o 3wk h/o increasing frequency and severity of CP radiating across chest and down both arms, leaving hands numb, admitted for further evaluation of USAP. Currently on Heparin infusion at 800 units/hr. HL this AM was sub-therapeutic at 0.24. RN reports no s/s of bleeding   Goal of Therapy:  Heparin level 0.3-0.7 units/ml Monitor platelets by anticoagulation protocol: Yes   Plan:  -Will give heparin 1000 units IV bolus x1, then increase infusion to 950 units/hr -F/u repeat 6 hr HL  -Monitor heparin levels and CBC.  Vinnie LevelBenjamin Braxten Memmer, PharmD.  Clinical Pharmacist Pager [phone]

## 2014-01-18 NOTE — ED Notes (Signed)
Pt arrives via EMS from work, states he has had 3 weeks, pain is intermittent and sharp. Has recently occurred daily. States he talked to his ex-father in law last night regarding pain, who urged him to call ambulance.  Hx acid reflux. Pain initially 8/10, now 3/10. 325mg  aspirin PTA, 1 SL nitro with EMS. NSR on EMS EKG.

## 2014-01-18 NOTE — Plan of Care (Signed)
Problem: Consults Goal: PCI Patient Education (See Patient Education module for education specifics.) Outcome: Completed/Met Date Met:  01/18/14

## 2014-01-18 NOTE — H&P (Addendum)
History and Physical   Admit date: 01/17/2014 Name:  Douglas [name] Medical record number: [id number] DOB/Age:  [birth year]  48 y.o. male  [facility]  Primary Physician:   Doesn't have one  Chief complaint/reason for admission: increasing chest pain  HPI:  This 48 year old male presenting to the emergency room with a 3 week history of increasing frequency and severity of chest pain. He began to notice midsternal chest discomfort that would radiate across his chest and then go down both arms described as pressure and heaviness leaving his hands numb. The discomfort would last for 15-30 minutes and then began to become more frequent but maybe last less in duration. He tried proton pump inhibitors but they did not make any difference in the pain and he noted the pain did feel different than previous indigestion that he had. He would develop sweating or shortness of breath with the discomfort. It was not really related to exertion or to food and was not pleuritic.  He works third shift and had a significant episode on the way to work and EMS was called after he got to work and transported him here. He was given nitroglycerin with relief. An initial point-of-care troponin was mildly positive although the serum troponin was negative. He is currently pain free. He is admitted now for treatment of unstable angina. His only risk factor is tobacco abuse. There is no history of hypertension or diabetes although his blood pressure was elevated in the emergency room. He denies palpitations or syncope. He has no PND, orthopnea or edema.  Past medical history:  History reviewed. No pertinent past medical history.   Past surgical history:  History reviewed. No pertinent past surgical history..  Allergies: has No Known Allergies.   Medications: Prior to Admission medications   Medication Sig Start Date End Date Taking? Authorizing Provider  omeprazole (PRILOSEC OTC)  20 MG tablet Take 20 mg by mouth daily.   Yes Historical Provider, MD  Aspirin-Salicylamide-Caffeine (BC HEADACHE POWDER PO) Take 1 packet by mouth daily as needed (for pain).    Historical Provider, MD  clindamycin (CLEOCIN) 150 MG capsule Take 450 mg by mouth every 6 (six) hours. Take 3 capsules by mouth every 6 hours.    Historical Provider, MD  dicyclomine (BENTYL) 20 MG tablet Take 1 tablet (20 mg total) by mouth 2 (two) times daily. 01/15/13   Garlon HatchetLisa M Sanders, PA-C  HYDROcodone-acetaminophen (NORCO/VICODIN) 5-325 MG per tablet Take 1-2 tablets by mouth every 6 (six) hours as needed for moderate pain.    Historical Provider, MD  ondansetron (ZOFRAN ODT) 4 MG disintegrating tablet Take 1 tablet (4 mg total) by mouth every 8 (eight) hours as needed for nausea. 01/15/13   Garlon HatchetLisa M Sanders, PA-C  Ranitidine HCl (ZANTAC PO) Take 1 tablet by mouth daily as needed (for heartburn).    Historical Provider, MD    Family History:  Family Status  Relation Status Death Age  . Father      muscular dystrophy  . Mother Deceased     dementia/lung cancer  . Sister Alive     Social History:   reports that he has been smoking Cigarettes.  He has a 25 pack-year smoking history. He has never used smokeless tobacco. He reports that he drinks alcohol. He reports that he does not use illicit drugs.   History   Social History Narrative   Divorced.  2 sons.  Works at a [employer] in the [employer].  Formerly waited tables.     Review of Systems:  Occasional headaches, no real shortness of breath. Other than as noted above, the remainder of the review of systems is normal  Physical Exam: BP 131/81 mmHg  Pulse 77  Temp(Src) 98 F (36.7 C) (Oral)  Resp 14  Ht 5\' 10"  (1.778 m)  Wt 65.772 kg (145 lb)  BMI 20.81 kg/m2  SpO2 98% General appearance: pleasant white male in no acute distress Head: Normocephalic, without obvious abnormality, atraumatic Eyes: conjunctivae/corneas clear. PERRL, EOM's intact.  Fundi not examined Neck: no adenopathy, no carotid bruit, no JVD and supple, symmetrical, trachea midline Lungs: clear to auscultation bilaterally Heart: regular rate and rhythm, S1, S2 normal, no murmur, click, rub or gallop Abdomen: soft, non-tender; bowel sounds normal; no masses,  no organomegaly Rectal: deferred Extremities: extremities normal, atraumatic, no cyanosis or edema Pulses: 2+ and symmetric Skin: Skin color, texture, turgor normal. No rashes or lesions Neurologic: Grossly normal  Labs: CBC  Recent Labs  01/18/14 0011  WBC 8.3  RBC 5.12  HGB 17.2*  HCT 50.9  PLT 218  MCV 99.4  MCH 33.6  MCHC 33.8  RDW 12.5   CMP   Recent Labs  01/18/14 0011  NA 139  K 4.8  CL 103  CO2 26  GLUCOSE 105*  BUN 8  CREATININE 1.05  CALCIUM 9.1  GFRNONAA 83*  GFRAA >90   Cardiac Panel (last 3 results)  Recent Labs  01/18/14 0106  TROPONINI <0.30   Thyroid    EKG: Hyperacute T waves in the anterior leads, otherwise normal  Radiology: Scattered granuloma but otherwise normal   IMPRESSIONS: 1. Unstable angina pectoris 2. Tobacco abuse 3. Elevated blood pressure without prior diagnosis of hypertension  PLAN: Intravenous heparin, serial troponins, begin beta blockers, begin lipid-lowering therapy. The history is strongly suggestive of unstable angina occurring.  Keep nothing by mouth for catheterization in the morning.  Cardiac catheterization was discussed with the patient fully including risks of myocardial infarction, death, stroke, bleeding, arrhythmia, dye allergy, renal insufficiency or bleeding.  The patient understands and is willing to proceed. Possibility of PCI also discussed with patient including stenting. .  Signed: [name]. Spencer Amulya Quintin, [name] MD [facility]  01/18/2014, 2:39 AM

## 2014-01-18 NOTE — Care Management Note (Addendum)
    Page 1 of 2   01/19/2014     10:46:54 AM CARE MANAGEMENT NOTE 01/19/2014  Patient:  Douglas [name],Douglas [name]   Account Number:  [id number]  Date Initiated:  01/18/2014  Documentation initiated by:  Donato SchultzHUTCHINSON,Clariza Sickman  Subjective/Objective Assessment:   CP     Action/Plan:   CM to follow for disposition needs   Anticipated DC Date:  01/19/2014   Anticipated DC Plan:  HOME/SELF CARE  In-house referral  Clinical Social Worker      DC Associate Professorlanning Services  CM consult  [facility]  Medication Assistance  [facility]      Choice offered to / List presented to:             Status of service:  Completed, signed off Medicare Important Message given?  NO (If response is "NO", the following Medicare IM given date fields will be blank) Date Medicare IM given:   Medicare IM given by:   Date Additional Medicare IM given:   Additional Medicare IM given by:    Discharge Disposition:  HOME/SELF CARE  Per UR Regulation:  Reviewed for med. necessity/level of care/duration of stay  If discussed at Long Length of Stay Meetings, dates discussed:    Comments:  Varina Hulon RN, BSN, MSHL, CCM  Nurse - Case Manager,  (Unit [phone])  [phone]  01/19/2014 Social:  From home.  Working at [employer] and states will have health insurance option in two months. Specialty Medication Review: ticagrelor (BRILINTA) tablet 90 mg  2 times daily Hx/o Self pay CM will provide patient with Brilinta patient assistance application on 01/19/2014. Instructed to complete application and return to MD office for final Md signature and MDs office can fax for patient. CM advised to notify MD if ever any issues getting medication and office can assist with samples to cover the need in the interim. CM provided Brilinta med 30 day free card with instructions. CM provided hard copy of $4.00 med list with instructions on use CM provided [facility].  Advised of Walk-in appt options and SW  Services to asssit with any further medication assistance programs and / or MCD application instructions if needed. CM provided MATCH Letter for initial medication assistance. Dispo Plan:  Home / Self care.

## 2014-01-18 NOTE — ED Notes (Signed)
Cards into see pt

## 2014-01-18 NOTE — ED Notes (Signed)
Nada BoozerLaura Ingold w/ cards informed of trop. She states she will be down to see

## 2014-01-18 NOTE — H&P (View-Only) (Signed)
 Patient Name: Douglas [name] Date of Encounter: 01/18/2014     Principal Problem:   NSTEMI (non-ST elevated myocardial infarction) Active Problems:   Tobacco abuse    Primary Cardiologist : Spencer Tilley   SUBJECTIVE  Mr. Zehring is a 48 year old male who is being evaluated this morning by Dr Tilley  for unstable angina that has been present intermittently for three weeks occuring two to three times daily.  He presented to the ED last night due to worsening symptoms of chest pressure located across his chest with radiation down bilateral arms that have progressed over past three days. Since patient arrived to ED last night he's had no chest pain, shortness of breath, nausea, headaches, dizziness, blurred vision. He has been resting comfortably and believes his lack of pain is due to the medications given to him by EMS and ED (nitro, aspirin, heparin).  CURRENT MEDS . [START ON 01/19/2014] aspirin  81 mg Oral Pre-Cath  . [START ON 01/19/2014] aspirin EC  81 mg Oral Daily  . atorvastatin  80 mg Oral q1800  . metoprolol tartrate  25 mg Oral BID  . sodium chloride  3 mL Intravenous Q12H    OBJECTIVE  Filed Vitals:   01/18/14 0545 01/18/14 0600 01/18/14 0615 01/18/14 0700  BP: 102/60 127/91 117/77 121/69  Pulse: 74 70 68   Temp:      TempSrc:      Resp: 11 8 9 14  Height:      Weight:      SpO2: 98% 98% 98% 97%   No intake or output data in the 24 hours ending 01/18/14 0801 Filed Weights   01/18/14 0006  Weight: 145 lb (65.772 kg)    PHYSICAL EXAM  General: Pleasant, NAD. Neuro: Alert and oriented X 3. Moves all extremities spontaneously. Psych: Normal affect. HEENT:  Normal  Neck: Supple without bruits or JVD. Lungs:  Resp regular and unlabored, CTA. Heart: RRR no s3, s4, or murmurs. Abdomen: Soft, non-tender, non-distended, BS + x 4.  Extremities: No clubbing, cyanosis or edema. DP/PT/Radials 2+ and equal bilaterally.  Accessory Clinical  Findings  CBC  Recent Labs  01/18/14 0011  WBC 8.3  HGB 17.2*  HCT 50.9  MCV 99.4  PLT 218   Basic Metabolic Panel  Recent Labs  01/18/14 0011 01/18/14 0609  NA 139 139  K 4.8 4.4  CL 103 102  CO2 26 24  GLUCOSE 105* 105*  BUN 8 8  CREATININE 1.05 0.97  CALCIUM 9.1 8.8   Liver Function Tests  Recent Labs  01/18/14 0609  AST 24  ALT 16  ALKPHOS 113  BILITOT 0.4  PROT 6.3  ALBUMIN 3.3*   Cardiac Enzymes  Recent Labs  01/18/14 0106 01/18/14 0609  TROPONINI <0.30 0.34*   Fasting Lipid Panel  Recent Labs  01/18/14 0609  CHOL 154  HDL 45  LDLCALC 94  TRIG 73  CHOLHDL 3.4   Thyroid Function Tests  Recent Labs  01/18/14 0609  TSH 1.850    TELE  NSR  ECG  Normal sinus rhythm, rate regular at 80, ? LAE - hyperacute T waves - no acute ST changes.  Radiology/Studies  Dg Chest 2 View  01/18/2014   CLINICAL DATA:  Chest pain for 3 weeks.  EXAM: CHEST  2 VIEW  COMPARISON:  None.  FINDINGS: A punctate calcified granuloma is seen in the right upper lobe. The lungs are otherwise clear. Heart size is normal. No pneumothorax or pleural   effusion. No focal bony abnormality.  IMPRESSION: No acute disease.   Electronically Signed   By: Drusilla Kannerhomas  Dalessio M.D.   On: 01/18/2014 00:37   ASSESSMENT AND PLAN  1. NSTEMI: Presented last night to ED for evaluation of substernal chest pressure located across chest with radiation down bilateral arms that has been intermittently present for three weeks. ECG without evidence of ST elevation, however, troponin levels are elevated and have increased since last night. No chest pain, shortness of breath, headaches, blurred vision, weakness overnight. Continue IV heparin, beta blockers, and statin as ordered. Proceed with plans for cardiac catheterization scheduled this morning.  2. Tobacco abuse: Discussed importance for smoking cessation and verbalized readiness to quit.   3. Elevated blood pressure: Current BP 121/69, HR  range 93-66.  No dizziness, headaches, blurred vision. Continue plan for beta blocker initiation and lipid lowering therapy.  Signed, Nicolasa Duckinghristopher Dyllen Menning NP   Spoke with Dr Donnie Ahoilley this am he will assume care of patient  Dr harding to perform cath for him today  Charlton HawsPeter Nishan

## 2014-01-18 NOTE — Progress Notes (Addendum)
Patient Name: Douglas [name] Date of Encounter: 01/18/2014     Principal Problem:   NSTEMI (non-ST elevated myocardial infarction) Active Problems:   Tobacco abuse    Primary Cardiologist : Viann FishSpencer Tilley   SUBJECTIVE  Mr. Douglas [name] is a 48 year old male who is being evaluated this morning by Dr Donnie Ahoilley  for unstable angina that has been present intermittently for three weeks occuring two to three times daily.  He presented to the ED last night due to worsening symptoms of chest pressure located across his chest with radiation down bilateral arms that have progressed over past three days. Since patient arrived to ED last night he's had no chest pain, shortness of breath, nausea, headaches, dizziness, blurred vision. He has been resting comfortably and believes his lack of pain is due to the medications given to him by EMS and ED (nitro, aspirin, heparin).  CURRENT MEDS . [START ON 01/19/2014] aspirin  81 mg Oral Pre-Cath  . [START ON 01/19/2014] aspirin EC  81 mg Oral Daily  . atorvastatin  80 mg Oral q1800  . metoprolol tartrate  25 mg Oral BID  . sodium chloride  3 mL Intravenous Q12H    OBJECTIVE  Filed Vitals:   01/18/14 0545 01/18/14 0600 01/18/14 0615 01/18/14 0700  BP: 102/60 127/91 117/77 121/69  Pulse: 74 70 68   Temp:      TempSrc:      Resp: 11 8 9 14   Height:      Weight:      SpO2: 98% 98% 98% 97%   No intake or output data in the 24 hours ending 01/18/14 0801 Filed Weights   01/18/14 0006  Weight: 145 lb (65.772 kg)    PHYSICAL EXAM  General: Pleasant, NAD. Neuro: Alert and oriented X 3. Moves all extremities spontaneously. Psych: Normal affect. HEENT:  Normal  Neck: Supple without bruits or JVD. Lungs:  Resp regular and unlabored, CTA. Heart: RRR no s3, s4, or murmurs. Abdomen: Soft, non-tender, non-distended, BS + x 4.  Extremities: No clubbing, cyanosis or edema. DP/PT/Radials 2+ and equal bilaterally.  Accessory Clinical  Findings  CBC  Recent Labs  01/18/14 0011  WBC 8.3  HGB 17.2*  HCT 50.9  MCV 99.4  PLT 218   Basic Metabolic Panel  Recent Labs  01/18/14 0011 01/18/14 0609  NA 139 139  K 4.8 4.4  CL 103 102  CO2 26 24  GLUCOSE 105* 105*  BUN 8 8  CREATININE 1.05 0.97  CALCIUM 9.1 8.8   Liver Function Tests  Recent Labs  01/18/14 0609  AST 24  ALT 16  ALKPHOS 113  BILITOT 0.4  PROT 6.3  ALBUMIN 3.3*   Cardiac Enzymes  Recent Labs  01/18/14 0106 01/18/14 0609  TROPONINI <0.30 0.34*   Fasting Lipid Panel  Recent Labs  01/18/14 0609  CHOL 154  HDL 45  LDLCALC 94  TRIG 73  CHOLHDL 3.4   Thyroid Function Tests  Recent Labs  01/18/14 0609  TSH 1.850    TELE  NSR  ECG  Normal sinus rhythm, rate regular at 80, ? LAE - hyperacute T waves - no acute ST changes.  Radiology/Studies  Dg Chest 2 View  01/18/2014   CLINICAL DATA:  Chest pain for 3 weeks.  EXAM: CHEST  2 VIEW  COMPARISON:  None.  FINDINGS: A punctate calcified granuloma is seen in the right upper lobe. The lungs are otherwise clear. Heart size is normal. No pneumothorax or pleural  effusion. No focal bony abnormality.  IMPRESSION: No acute disease.   Electronically Signed   By: Drusilla Kannerhomas  Dalessio M.D.   On: 01/18/2014 00:37   ASSESSMENT AND PLAN  1. NSTEMI: Presented last night to ED for evaluation of substernal chest pressure located across chest with radiation down bilateral arms that has been intermittently present for three weeks. ECG without evidence of ST elevation, however, troponin levels are elevated and have increased since last night. No chest pain, shortness of breath, headaches, blurred vision, weakness overnight. Continue IV heparin, beta blockers, and statin as ordered. Proceed with plans for cardiac catheterization scheduled this morning.  2. Tobacco abuse: Discussed importance for smoking cessation and verbalized readiness to quit.   3. Elevated blood pressure: Current BP 121/69, HR  range 93-66.  No dizziness, headaches, blurred vision. Continue plan for beta blocker initiation and lipid lowering therapy.  Signed, Nicolasa Duckinghristopher Berge NP   Spoke with Dr Donnie Ahoilley this am he will assume care of patient  Dr harding to perform cath for him today  Charlton HawsPeter Adenike Shidler

## 2014-01-18 NOTE — ED Notes (Signed)
Patient transported to X-ray 

## 2014-01-18 NOTE — ED Provider Notes (Signed)
CSN: [id number]     Arrival date & time 01/17/14  2356 History   First MD Initiated Contact with Patient 01/17/14 2358     Chief Complaint  Patient presents with  . Chest Pain     (Consider location/radiation/quality/duration/timing/severity/associated sxs/prior Treatment) HPI 48 year old male presents to the emergency department via EMS from work with complaint of chest pain.  Reports he has been having ongoing chest pain for the last 3-1/2 weeks.  Pain starts in the center of his chest, and radiates down both arms.  Pain is associated with diaphoresis, nausea, shortness of breath.  Symptoms were infrequent at first, but over the last several days it is increased to 3-4 episodes a day.  Patient thought symptoms may be due to reflux and has been taking over-the-counter Nexium without improvement in symptoms.  Symptoms are brought on by mild exertion, but occasionally he will wake from sleep with the pain.  Patient has not had a physical exam in many years.  He smokes a pack a day and has been since he was 18.  He reports family history of congestive heart failure.  Patient reports that he is fairly sedentary, only goes to work and comes home.  His exercise recently.  He does not know his cholesterol level.patient reports tonight at work he had another episode of pain which was slightly worse than usual.  He was told last night by his ex-father-in-law that he should get it checked out.  He reports after nitroglycerin he had near resolution of pain.  He is pain-free at this time.  He received a full dose of aspirin from EMS. History reviewed. No pertinent past medical history. History reviewed. No pertinent past surgical history. No family history on file. History  Substance Use Topics  . Smoking status: Current Every Day Smoker -- 1.00 packs/day for 25 years    Types: Cigarettes  . Smokeless tobacco: Not on file  . Alcohol Use: Yes     Comment: 3 beers/day    Review of Systems  See History  of Present Illness; otherwise all other systems are reviewed and negative   Allergies  Review of patient's allergies indicates no known allergies.  Home Medications   Prior to Admission medications   Medication Sig Start Date End Date Taking? Authorizing Provider  omeprazole (PRILOSEC OTC) 20 MG tablet Take 20 mg by mouth daily.   Yes Historical Provider, MD  Aspirin-Salicylamide-Caffeine (BC HEADACHE POWDER PO) Take 1 packet by mouth daily as needed (for pain).    Historical Provider, MD  clindamycin (CLEOCIN) 150 MG capsule Take 450 mg by mouth every 6 (six) hours. Take 3 capsules by mouth every 6 hours.    Historical Provider, MD  dicyclomine (BENTYL) 20 MG tablet Take 1 tablet (20 mg total) by mouth 2 (two) times daily. 01/15/13   Garlon HatchetLisa M Sanders, PA-C  HYDROcodone-acetaminophen (NORCO/VICODIN) 5-325 MG per tablet Take 1-2 tablets by mouth every 6 (six) hours as needed for moderate pain.    Historical Provider, MD  ondansetron (ZOFRAN ODT) 4 MG disintegrating tablet Take 1 tablet (4 mg total) by mouth every 8 (eight) hours as needed for nausea. 01/15/13   Garlon HatchetLisa M Sanders, PA-C  Ranitidine HCl (ZANTAC PO) Take 1 tablet by mouth daily as needed (for heartburn).    Historical Provider, MD   BP 131/81 mmHg  Pulse 77  Temp(Src) 98 F (36.7 C) (Oral)  Resp 14  Ht 5\' 10"  (1.778 m)  Wt 145 lb (65.772 kg)  BMI  20.81 kg/m2  SpO2 98% Physical Exam  Constitutional: He is oriented to person, place, and time. He appears well-developed and well-nourished.  HENT:  Head: Normocephalic and atraumatic.  Nose: Nose normal.  Mouth/Throat: Oropharynx is clear and moist.  Eyes: Conjunctivae and EOM are normal. Pupils are equal, round, and reactive to light.  Neck: Normal range of motion. Neck supple. No JVD present. No tracheal deviation present. No thyromegaly present.  Cardiovascular: Normal rate, regular rhythm, normal heart sounds and intact distal pulses.  Exam reveals no gallop and no friction rub.    No murmur heard. Pulmonary/Chest: Effort normal and breath sounds normal. No stridor. No respiratory distress. He has no wheezes. He has no rales. He exhibits no tenderness.  Abdominal: Soft. Bowel sounds are normal. He exhibits no distension and no mass. There is no tenderness. There is no rebound and no guarding.  Musculoskeletal: Normal range of motion. He exhibits no edema or tenderness.  Lymphadenopathy:    He has no cervical adenopathy.  Neurological: He is alert and oriented to person, place, and time. He displays normal reflexes. He exhibits normal muscle tone. Coordination normal.  Skin: Skin is warm and dry. No rash noted. No erythema. No pallor.  Psychiatric: He has a normal mood and affect. His behavior is normal. Judgment and thought content normal.  Nursing note and vitals reviewed.   ED Course  Procedures (including critical care time) Labs Review Labs Reviewed  CBC - Abnormal; Notable for the following:    Hemoglobin 17.2 (*)    All other components within normal limits  BASIC METABOLIC PANEL - Abnormal; Notable for the following:    Glucose, Bld 105 (*)    GFR calc non Af Amer 83 (*)    All other components within normal limits  I-STAT TROPOININ, ED - Abnormal; Notable for the following:    Troponin i, poc 0.12 (*)    All other components within normal limits  TROPONIN I    Imaging Review Dg Chest 2 View  01/18/2014   CLINICAL DATA:  Chest pain for 3 weeks.  EXAM: CHEST  2 VIEW  COMPARISON:  None.  FINDINGS: A punctate calcified granuloma is seen in the right upper lobe. The lungs are otherwise clear. Heart size is normal. No pneumothorax or pleural effusion. No focal bony abnormality.  IMPRESSION: No acute disease.   Electronically Signed   By: Drusilla Kannerhomas  Dalessio M.D.   On: 01/18/2014 00:37     EKG Interpretation   Date/Time:  Monday January 18 2014 00:11:33 EST Ventricular Rate:  80 PR Interval:  162 QRS Duration: 75 QT Interval:  396 QTC Calculation:  457 R Axis:   58 Text Interpretation:  Sinus rhythm hyperacute T waves.  No prior ekgs for  comparison Confirmed by Kalika Smay  MD, Hannia Matchett ([id number]) on 01/18/2014 12:13:57 AM      MDM   Final diagnoses:  Chest pain    48 year old malewith history of tobacco abuse, family history of heart disease who has not had a evaluation by Dr. In some time who has had 3 weeks of chest pain with radiation.  The story is somewhat concerning for unstable angina.  Point of care troponin is elevated.  Heart score is 4.  Will discuss with cardiology for admission.  2:42 AM Case d/w Dr Donnie Ahoilley.  Troponin I was negative.  He will see the patient in the ED.  Olivia Mackielga M Jamy Whyte, MD 01/18/14 [phone]

## 2014-01-19 LAB — CBC
HCT: 48.9 % (ref 39.0–52.0)
Hemoglobin: 16.7 g/dL (ref 13.0–17.0)
MCH: 33.1 pg (ref 26.0–34.0)
MCHC: 34.2 g/dL (ref 30.0–36.0)
MCV: 97 fL (ref 78.0–100.0)
Platelets: 180 10*3/uL (ref 150–400)
RBC: 5.04 MIL/uL (ref 4.22–5.81)
RDW: 12.4 % (ref 11.5–15.5)
WBC: 7.3 10*3/uL (ref 4.0–10.5)

## 2014-01-19 LAB — BASIC METABOLIC PANEL
Anion gap: 14 (ref 5–15)
BUN: 11 mg/dL (ref 6–23)
CALCIUM: 8.5 mg/dL (ref 8.4–10.5)
CO2: 21 mEq/L (ref 19–32)
Chloride: 102 mEq/L (ref 96–112)
Creatinine, Ser: 0.92 mg/dL (ref 0.50–1.35)
Glucose, Bld: 103 mg/dL — ABNORMAL HIGH (ref 70–99)
Potassium: 4.4 mEq/L (ref 3.7–5.3)
SODIUM: 137 meq/L (ref 137–147)

## 2014-01-19 LAB — POCT ACTIVATED CLOTTING TIME: Activated Clotting Time: 518 seconds

## 2014-01-19 MED ORDER — NITROGLYCERIN 0.4 MG SL SUBL: 0.4000 mg | SUBLINGUAL_TABLET | SUBLINGUAL | Status: DC | PRN

## 2014-01-19 MED ORDER — TICAGRELOR 90 MG PO TABS: 90.0000 mg | ORAL_TABLET | Freq: Two times a day (BID) | ORAL | Status: DC

## 2014-01-19 MED ORDER — METOPROLOL TARTRATE 25 MG PO TABS: 25.0000 mg | ORAL_TABLET | Freq: Two times a day (BID) | ORAL | Status: DC

## 2014-01-19 MED ORDER — ASPIRIN 81 MG PO CHEW: 81.0000 mg | CHEWABLE_TABLET | Freq: Every day | ORAL | Status: DC

## 2014-01-19 MED ORDER — ROSUVASTATIN CALCIUM 10 MG PO TABS: 10.0000 mg | ORAL_TABLET | Freq: Every day | ORAL | Status: DC

## 2014-01-19 MED FILL — Sodium Chloride IV Soln 0.9%: INTRAVENOUS | Qty: 50 | Status: AC

## 2014-01-19 NOTE — Discharge Summary (Signed)
Physician Discharge Summary  Patient ID: Douglas [name] MRN: [id number] DOB/AGE: August 12, 1965 48 y.o.  Admit date: 01/17/2014 Discharge date: 01/19/2014  Primary Discharge Diagnosis:  1. Unstable angina pectoris  Secondary Discharge Diagnosis: 2. Coronary artery disease with stenting of the right coronary artery and residual moderate disease involving the LAD and diagonal branch 3. Tobacco abuse stopped 4. Elevation of blood pressure without prior diagnosis of hypertension  Procedures:  Cardiac catheterization and stenting of the right coronary artery by Dr. Crista CurbBerry   Hospital Course: This 48 year old male presents to emergency room with a 3 week history of progressive chest discomfort suggestive of unstable angina pectoris. The pain was increasing in frequency and severity and he was admitted for further evaluation. He has a history of smoking. The patient was admitted to the hospital. He was placed on intravenous heparin, beta blocker and given a dose of atorvastatin. Cardiac catheterization was done through the radial approach the next day by Dr. Gery PrayBarry. The patient had a normal left main, 50% proximal LAD at the takeoff of a moderate-sized diagonal branch, diagonal branch had a 60% ostial stenosis followed by 60% proximal stenosis. Circumflex was free of significant disease, right coronary artery was dominant with a 90% stenosis in the mid to distal portion. Left ventricular function was normal. The patient had a stent placed with a 3.5 x 18 mm Xince drug-eluting stent with stenosis going to 0%. He was postdilated with a 3.75 mm balloon.he remained free of angina following the procedure was seen by rehabilitation the next morning. The importance of stopping cigarette smoking was discussed with him. He was given cards to use to get samples of BRILINTA and will be placed on Crestor. He will be given some samples at the office. He was discharged home in improved condition.  Discharge  Exam: Blood pressure 166/67, pulse 81, temperature 98.5 F (36.9 C), temperature source Oral, resp. rate 18, height 5\' 10"  (1.778 m), weight 66 kg (145 lb 8.1 oz), SpO2 97 %. Weight: 66 kg (145 lb 8.1 oz)   Lungs clear, no S3, radial catheterization site is clean and dry  Labs: CBC:   Lab Results  Component Value Date   WBC 7.3 01/19/2014   HGB 16.7 01/19/2014   HCT 48.9 01/19/2014   MCV 97.0 01/19/2014   PLT 180 01/19/2014    CMP:  Recent Labs Lab 01/18/14 0609 01/19/14 0321  NA 139 137  K 4.4 4.4  CL 102 102  CO2 24 21  BUN 8 11  CREATININE 0.97 0.92  CALCIUM 8.8 8.5  PROT 6.3  --   BILITOT 0.4  --   ALKPHOS 113  --   ALT 16  --   AST 24  --   GLUCOSE 105* 103*    Lipid Panel     Component Value Date/Time   CHOL 154 01/18/2014 0609   TRIG 73 01/18/2014 0609   HDL 45 01/18/2014 0609   CHOLHDL 3.4 01/18/2014 0609   VLDL 15 01/18/2014 0609   LDLCALC 94 01/18/2014 0609    Cardiac Enzymes:  Recent Labs  01/18/14 0106 01/18/14 0609 01/18/14 1215  TROPONINI <0.30 0.34* <0.30   Thyroid: Lab Results  Component Value Date   TSH 1.850 01/18/2014    Radiology: Punctate granulomas seen in right upper lung, otherwise normal.  EKG: EKG was normal on admission  Discharge Medications:   Medication List    STOP taking these medications        BC HEADACHE POWDER PO  clindamycin 150 MG capsule  Commonly known as:  CLEOCIN     dicyclomine 20 MG tablet  Commonly known as:  BENTYL     omeprazole 20 MG tablet  Commonly known as:  PRILOSEC OTC     ondansetron 4 MG disintegrating tablet  Commonly known as:  ZOFRAN ODT     ZANTAC PO      TAKE these medications        aspirin 81 MG chewable tablet  Chew 1 tablet (81 mg total) by mouth daily.     HYDROcodone-acetaminophen 5-325 MG per tablet  Commonly known as:  NORCO/VICODIN  Take 1-2 tablets by mouth every 6 (six) hours as needed for moderate pain.     metoprolol tartrate 25 MG tablet   Commonly known as:  LOPRESSOR  Take 1 tablet (25 mg total) by mouth 2 (two) times daily.     nitroGLYCERIN 0.4 MG SL tablet  Commonly known as:  NITROSTAT  Place 1 tablet (0.4 mg total) under the tongue every 5 (five) minutes x 3 doses as needed for chest pain.     rosuvastatin 10 MG tablet  Commonly known as:  CRESTOR  Take 1 tablet (10 mg total) by mouth daily.     ticagrelor 90 MG Tabs tablet  Commonly known as:  BRILINTA  Take 1 tablet (90 mg total) by mouth 2 (two) times daily.       Followup plans and appointments:  Follow-up with Dr. Donnie Ahoilley in one week. May return to work on Thursday  Time spent with patient to include physician time:  30 minutes   Signed: W. Ashley RoyaltySpencer Caniyah Murley, [name] MD [facility] 01/19/2014, 8:40 AM

## 2014-01-19 NOTE — Progress Notes (Signed)
CARDIAC REHAB PHASE I   PRE:  Rate/Rhythm: 77 SR    BP: sitting 115/79    SaO2:   MODE:  Ambulation: 800 ft   POST:  Rate/Rhythm: 99 SR    BP: sitting 166/77     SaO2:   Tolerated very well. No c/o. Ed completed. Wants to quit smoking and discussed methods, resources given. Unable to do CRPII due to lack of transportation and work. [id number]  Douglas [name], [name] Kristan CES, ACSM 01/19/2014 8:29 AM

## 2014-01-19 NOTE — Clinical Social Work Note (Signed)
Per MD patient is ready for discharge. Patient needed assistance with transportation home . Patient was provided with bus pass for discharge.  Douglas [name]  CSW-Intern

## 2014-02-18 ENCOUNTER — Encounter (HOSPITAL_COMMUNITY): Payer: Self-pay | Admitting: Cardiovascular Disease

## 2014-07-11 ENCOUNTER — Encounter (HOSPITAL_COMMUNITY): Payer: Self-pay | Admitting: Emergency Medicine

## 2014-07-11 ENCOUNTER — Emergency Department (HOSPITAL_COMMUNITY)
Admission: EM | Admit: 2014-07-11 | Discharge: 2014-07-11 | Disposition: A | Discharge status: Home / Self Care | Payer: Self-pay | Attending: Emergency Medicine | Admitting: Emergency Medicine

## 2014-07-11 DIAGNOSIS — I25119 Atherosclerotic heart disease of native coronary artery with unspecified angina pectoris: Secondary | ICD-10-CM | POA: Insufficient documentation

## 2014-07-11 DIAGNOSIS — R110 Nausea: Secondary | ICD-10-CM | POA: Insufficient documentation

## 2014-07-11 DIAGNOSIS — Z8719 Personal history of other diseases of the digestive system: Secondary | ICD-10-CM | POA: Insufficient documentation

## 2014-07-11 DIAGNOSIS — I252 Old myocardial infarction: Secondary | ICD-10-CM | POA: Insufficient documentation

## 2014-07-11 DIAGNOSIS — K029 Dental caries, unspecified: Secondary | ICD-10-CM | POA: Insufficient documentation

## 2014-07-11 DIAGNOSIS — Z79899 Other long term (current) drug therapy: Secondary | ICD-10-CM | POA: Insufficient documentation

## 2014-07-11 DIAGNOSIS — Z7982 Long term (current) use of aspirin: Secondary | ICD-10-CM | POA: Insufficient documentation

## 2014-07-11 DIAGNOSIS — Z720 Tobacco use: Secondary | ICD-10-CM | POA: Insufficient documentation

## 2014-07-11 MED ORDER — IBUPROFEN 800 MG PO TABS: 800.0000 mg | ORAL_TABLET | Freq: Three times a day (TID) | ORAL | Status: DC

## 2014-07-11 MED ORDER — PENICILLIN V POTASSIUM 500 MG PO TABS: 500.0000 mg | ORAL_TABLET | Freq: Three times a day (TID) | ORAL | Status: DC

## 2014-07-11 MED ORDER — IBUPROFEN 800 MG PO TABS
800.0000 mg | ORAL_TABLET | Freq: Once | ORAL | Status: AC
  Administered 2014-07-11: 800 mg via ORAL
  Filled 2014-07-11: qty 1

## 2014-07-11 NOTE — ED Provider Notes (Signed)
CSN: [id number]     Arrival date & time 07/11/14  1530 History  This chart was scribed for non-physician practitioner Fayrene Helper, PA, working with Doug Sou, MD, by Tanda Rockers, ED Scribe. This patient was seen in room WTR6/WTR6 and the patient's care was started at 3:58 PM.    Chief Complaint  Patient presents with  . Dental Pain   The history is provided by the patient. No language interpreter was used.     HPI Comments: Douglas [name] is a 49 y.o. male who presents to the Emergency Department complaining of severe, constant, left upper dental pain that began 1 day ago. He describes the pain as a sharp, shooting sensation. He denies an abnormal taste in his mouth. The pain is mildy alleviated with a cold substance. Pt also complains of left sided facial swelling. He reports that he is nauseous due to the severity of the pain. Denies fever, chills, or any other symptoms. Pt plans to follow up with dentist this week.   Dentist - [facility]   Past Medical History  Diagnosis Date  . Tobacco abuse   . Coronary artery disease   . Anginal pain   . GERD (gastroesophageal reflux disease)   . NSTEMI (non-ST elevated myocardial infarction) 01/2014   Past Surgical History  Procedure Laterality Date  . Coronary angioplasty with stent placement  01/18/2014    "1"  . Left heart catheterization with coronary angiogram N/A 01/18/2014    Procedure: LEFT HEART CATHETERIZATION WITH CORONARY ANGIOGRAM;  Surgeon: Runell Gess, MD;  Location: [facility] CATH LAB;  Service: Cardiovascular;  Laterality: N/A;   No family history on file. History  Substance Use Topics  . Smoking status: Current Every Day Smoker -- 1.00 packs/day for 31 years    Types: Cigarettes  . Smokeless tobacco: Never Used  . Alcohol Use: 12.6 oz/week    0 Standard drinks or equivalent, 21 Cans of beer per week     Comment: 01/18/2014 " ~3 beers/day"    Review of Systems  Constitutional: Negative for fever and chills.  HENT:  Positive for dental problem.   Gastrointestinal: Positive for nausea.      Allergies  Review of patient's allergies indicates no known allergies.  Home Medications   Prior to Admission medications   Medication Sig Start Date End Date Taking? Authorizing Provider  aspirin 81 MG chewable tablet Chew 1 tablet (81 mg total) by mouth daily. 01/19/14   Othella Boyer, MD  HYDROcodone-acetaminophen (NORCO/VICODIN) 5-325 MG per tablet Take 1-2 tablets by mouth every 6 (six) hours as needed for moderate pain.    Historical Provider, MD  metoprolol tartrate (LOPRESSOR) 25 MG tablet Take 1 tablet (25 mg total) by mouth 2 (two) times daily. 01/19/14   Othella Boyer, MD  nitroGLYCERIN (NITROSTAT) 0.4 MG SL tablet Place 1 tablet (0.4 mg total) under the tongue every 5 (five) minutes x 3 doses as needed for chest pain. 01/19/14   Othella Boyer, MD  rosuvastatin (CRESTOR) 10 MG tablet Take 1 tablet (10 mg total) by mouth daily. 01/19/14   Othella Boyer, MD  ticagrelor (BRILINTA) 90 MG TABS tablet Take 1 tablet (90 mg total) by mouth 2 (two) times daily. 01/19/14   Othella Boyer, MD   Triage Vitals: BP 129/82 mmHg  Pulse 104  Temp(Src) 98.3 F (36.8 C) (Oral)  Resp 16  SpO2 99%   Physical Exam  Constitutional: He is oriented to person, place, and time. He appears  well-developed and well-nourished. No distress.  HENT:  Head: Normocephalic and atraumatic.  Right Ear: External ear normal.  Left Ear: External ear normal.  Tenderness to tooth #12 with dental decay.  Mouth gingival with no obvious deformity.  No trismus.  Left sided facial swelling as compared to right   Eyes: Conjunctivae and EOM are normal.  Neck: Neck supple. No tracheal deviation present.  Cardiovascular: Normal rate.   Pulmonary/Chest: Effort normal. No respiratory distress.  Musculoskeletal: Normal range of motion.  Lymphadenopathy:    He has no cervical adenopathy.  Neurological: He is alert and oriented to  person, place, and time.  Skin: Skin is warm and dry.  Psychiatric: He has a normal mood and affect. His behavior is normal.  Nursing note and vitals reviewed.   ED Course  Procedures (including critical care time)  DIAGNOSTIC STUDIES: Oxygen Saturation is 99% on RA, normal by my interpretation.    COORDINATION OF CARE: 4:02 PM-Discussed treatment plan which includes Ibuprofen and Penicillin prescription with pt at bedside and pt agreed to plan. Advised pt to follow up with dentist as soon as possible.   Labs Review Labs Reviewed - No data to display  Imaging Review No results found.   EKG Interpretation None      MDM   Final diagnoses:  Pain due to dental caries   BP 129/82 mmHg  Pulse 104  Temp(Src) 98.3 F (36.8 C) (Oral)  Resp 16  SpO2 99%  I personally performed the services described in this documentation, which was scribed in my presence. The recorded information has been reviewed and is accurate.       Fayrene HelperBowie Kalisha Keadle, PA-C 07/11/14 1604  Donnetta HutchingBrian Cook, MD 07/12/14 [phone]

## 2014-07-11 NOTE — ED Notes (Signed)
Pt c/o L upper dental pain and abscess since Friday. Pt c/o worsening pain today 10/10. Pt has hx of dental abscess in other teeth. Pt sts "Iknows I need to get it pulled and I know who to call but I need antibiotics or something to help menow." Pt A&Ox4. Pt c/o mild swelling in his L cheek.

## 2014-07-11 NOTE — Discharge Instructions (Signed)

## 2015-03-05 ENCOUNTER — Encounter (HOSPITAL_COMMUNITY): Payer: Self-pay

## 2015-03-05 ENCOUNTER — Emergency Department (HOSPITAL_COMMUNITY): Payer: Self-pay

## 2015-03-05 ENCOUNTER — Inpatient Hospital Stay (HOSPITAL_COMMUNITY)
Admission: EM | Admit: 2015-03-05 | Discharge: 2015-03-10 | DRG: 440 | Disposition: A | Discharge status: Home / Self Care | Payer: Self-pay | Attending: Internal Medicine | Admitting: Internal Medicine

## 2015-03-05 DIAGNOSIS — I10 Essential (primary) hypertension: Secondary | ICD-10-CM | POA: Diagnosis present

## 2015-03-05 DIAGNOSIS — I252 Old myocardial infarction: Secondary | ICD-10-CM

## 2015-03-05 DIAGNOSIS — Z7982 Long term (current) use of aspirin: Secondary | ICD-10-CM

## 2015-03-05 DIAGNOSIS — K700 Alcoholic fatty liver: Secondary | ICD-10-CM | POA: Diagnosis present

## 2015-03-05 DIAGNOSIS — K760 Fatty (change of) liver, not elsewhere classified: Secondary | ICD-10-CM | POA: Diagnosis present

## 2015-03-05 DIAGNOSIS — IMO0002 Reserved for concepts with insufficient information to code with codable children: Secondary | ICD-10-CM

## 2015-03-05 DIAGNOSIS — K859 Acute pancreatitis without necrosis or infection, unspecified: Secondary | ICD-10-CM | POA: Diagnosis present

## 2015-03-05 DIAGNOSIS — F17210 Nicotine dependence, cigarettes, uncomplicated: Secondary | ICD-10-CM | POA: Diagnosis present

## 2015-03-05 DIAGNOSIS — R109 Unspecified abdominal pain: Secondary | ICD-10-CM | POA: Diagnosis present

## 2015-03-05 DIAGNOSIS — R229 Localized swelling, mass and lump, unspecified: Secondary | ICD-10-CM

## 2015-03-05 DIAGNOSIS — R1084 Generalized abdominal pain: Secondary | ICD-10-CM | POA: Insufficient documentation

## 2015-03-05 DIAGNOSIS — K8689 Other specified diseases of pancreas: Secondary | ICD-10-CM | POA: Diagnosis present

## 2015-03-05 DIAGNOSIS — Z720 Tobacco use: Secondary | ICD-10-CM | POA: Diagnosis present

## 2015-03-05 DIAGNOSIS — Z955 Presence of coronary angioplasty implant and graft: Secondary | ICD-10-CM

## 2015-03-05 DIAGNOSIS — K852 Alcohol induced acute pancreatitis without necrosis or infection: Principal | ICD-10-CM | POA: Diagnosis present

## 2015-03-05 DIAGNOSIS — Z79899 Other long term (current) drug therapy: Secondary | ICD-10-CM

## 2015-03-05 DIAGNOSIS — K860 Alcohol-induced chronic pancreatitis: Secondary | ICD-10-CM | POA: Diagnosis present

## 2015-03-05 DIAGNOSIS — F1010 Alcohol abuse, uncomplicated: Secondary | ICD-10-CM | POA: Diagnosis present

## 2015-03-05 DIAGNOSIS — I25119 Atherosclerotic heart disease of native coronary artery with unspecified angina pectoris: Secondary | ICD-10-CM | POA: Diagnosis present

## 2015-03-05 LAB — COMPREHENSIVE METABOLIC PANEL
ALBUMIN: 3.8 g/dL (ref 3.5–5.0)
ALT: 47 U/L (ref 17–63)
AST: 79 U/L — AB (ref 15–41)
Alkaline Phosphatase: 107 U/L (ref 38–126)
Anion gap: 10 (ref 5–15)
BILIRUBIN TOTAL: 0.3 mg/dL (ref 0.3–1.2)
BUN: 14 mg/dL (ref 6–20)
CALCIUM: 8.5 mg/dL — AB (ref 8.9–10.3)
CO2: 22 mmol/L (ref 22–32)
Chloride: 107 mmol/L (ref 101–111)
Creatinine, Ser: 1 mg/dL (ref 0.61–1.24)
GFR calc Af Amer: >60 mL/min (ref >60)
GFR calc non Af Amer: >60 mL/min (ref >60)
Glucose, Bld: 93 mg/dL (ref 65–99)
POTASSIUM: 4.1 mmol/L (ref 3.5–5.1)
Sodium: 139 mmol/L (ref 135–145)
TOTAL PROTEIN: 6.4 g/dL — AB (ref 6.5–8.1)

## 2015-03-05 LAB — URINALYSIS, ROUTINE W REFLEX MICROSCOPIC
Bilirubin Urine: NEGATIVE
Glucose, UA: NEGATIVE mg/dL
Hgb urine dipstick: NEGATIVE
Ketones, ur: NEGATIVE mg/dL
Leukocytes, UA: NEGATIVE
NITRITE: NEGATIVE
PH: 5.5 (ref 5.0–8.0)
Protein, ur: NEGATIVE mg/dL
SPECIFIC GRAVITY, URINE: 1.018 (ref 1.005–1.030)

## 2015-03-05 LAB — CBC
HEMATOCRIT: 47.9 % (ref 39.0–52.0)
HEMOGLOBIN: 16.6 g/dL (ref 13.0–17.0)
MCH: 33.5 pg (ref 26.0–34.0)
MCHC: 34.7 g/dL (ref 30.0–36.0)
MCV: 96.6 fL (ref 78.0–100.0)
Platelets: 247 10*3/uL (ref 150–400)
RBC: 4.96 MIL/uL (ref 4.22–5.81)
RDW: 13 % (ref 11.5–15.5)
WBC: 7.5 10*3/uL (ref 4.0–10.5)

## 2015-03-05 LAB — LIPASE, BLOOD: LIPASE: 52 U/L — AB (ref 11–51)

## 2015-03-05 MED ORDER — KCL IN DEXTROSE-NACL 20-5-0.45 MEQ/L-%-% IV SOLN
INTRAVENOUS | Status: DC
  Administered 2015-03-05 – 2015-03-07 (×4): via INTRAVENOUS
  Filled 2015-03-05 (×8): qty 1000

## 2015-03-05 MED ORDER — ASPIRIN 81 MG PO CHEW
81.0000 mg | CHEWABLE_TABLET | Freq: Every day | ORAL | Status: DC
  Administered 2015-03-06 – 2015-03-10 (×5): 81 mg via ORAL
  Filled 2015-03-05 (×5): qty 1

## 2015-03-05 MED ORDER — MORPHINE SULFATE (PF) 4 MG/ML IV SOLN
4.0000 mg | Freq: Once | INTRAVENOUS | Status: AC
  Administered 2015-03-05: 4 mg via INTRAVENOUS
  Filled 2015-03-05: qty 1

## 2015-03-05 MED ORDER — NITROGLYCERIN 0.4 MG SL SUBL: 0.4000 mg | SUBLINGUAL_TABLET | SUBLINGUAL | Status: DC | PRN

## 2015-03-05 MED ORDER — ONDANSETRON HCL 4 MG PO TABS
4.0000 mg | ORAL_TABLET | Freq: Four times a day (QID) | ORAL | Status: DC | PRN
  Administered 2015-03-09: 4 mg via ORAL
  Filled 2015-03-05: qty 1

## 2015-03-05 MED ORDER — ACETAMINOPHEN 325 MG PO TABS
650.0000 mg | ORAL_TABLET | Freq: Four times a day (QID) | ORAL | Status: DC | PRN
  Administered 2015-03-06 – 2015-03-08 (×2): 650 mg via ORAL
  Filled 2015-03-05 (×2): qty 2

## 2015-03-05 MED ORDER — MORPHINE SULFATE (PF) 2 MG/ML IV SOLN
1.0000 mg | INTRAVENOUS | Status: DC | PRN
  Administered 2015-03-05 – 2015-03-06 (×4): 1 mg via INTRAVENOUS
  Filled 2015-03-05 (×4): qty 1

## 2015-03-05 MED ORDER — HEPARIN SODIUM (PORCINE) 5000 UNIT/ML IJ SOLN
5000.0000 [IU] | Freq: Three times a day (TID) | INTRAMUSCULAR | Status: DC
  Administered 2015-03-05 – 2015-03-09 (×9): 5000 [IU] via SUBCUTANEOUS
  Filled 2015-03-05 (×14): qty 1

## 2015-03-05 MED ORDER — METOPROLOL TARTRATE 25 MG PO TABS
25.0000 mg | ORAL_TABLET | Freq: Two times a day (BID) | ORAL | Status: DC
  Administered 2015-03-05 – 2015-03-10 (×10): 25 mg via ORAL
  Filled 2015-03-05 (×11): qty 1

## 2015-03-05 MED ORDER — SODIUM CHLORIDE 0.9 % IV BOLUS (SEPSIS)
1000.0000 mL | Freq: Once | INTRAVENOUS | Status: AC
  Administered 2015-03-05: 1000 mL via INTRAVENOUS

## 2015-03-05 MED ORDER — IOHEXOL 300 MG/ML  SOLN
100.0000 mL | Freq: Once | INTRAMUSCULAR | Status: AC | PRN
  Administered 2015-03-05: 100 mL via INTRAVENOUS

## 2015-03-05 MED ORDER — OXYCODONE HCL 5 MG PO TABS
5.0000 mg | ORAL_TABLET | ORAL | Status: DC | PRN
  Administered 2015-03-05 – 2015-03-06 (×2): 5 mg via ORAL
  Filled 2015-03-05 (×2): qty 1

## 2015-03-05 MED ORDER — NICOTINE 21 MG/24HR TD PT24
21.0000 mg | MEDICATED_PATCH | Freq: Every day | TRANSDERMAL | Status: DC
  Administered 2015-03-05 – 2015-03-10 (×6): 21 mg via TRANSDERMAL
  Filled 2015-03-05 (×6): qty 1

## 2015-03-05 MED ORDER — ONDANSETRON HCL 4 MG/2ML IJ SOLN
4.0000 mg | Freq: Four times a day (QID) | INTRAMUSCULAR | Status: DC | PRN
  Filled 2015-03-05 (×2): qty 2

## 2015-03-05 MED ORDER — ACETAMINOPHEN 650 MG RE SUPP: 650.0000 mg | Freq: Four times a day (QID) | RECTAL | Status: DC | PRN

## 2015-03-05 MED ORDER — HYDRALAZINE HCL 20 MG/ML IJ SOLN
10.0000 mg | Freq: Four times a day (QID) | INTRAMUSCULAR | Status: DC | PRN
  Administered 2015-03-05: 10 mg via INTRAVENOUS
  Filled 2015-03-05: qty 1

## 2015-03-05 MED ORDER — IOHEXOL 300 MG/ML  SOLN
25.0000 mL | Freq: Once | INTRAMUSCULAR | Status: AC | PRN
  Administered 2015-03-05: 25 mL via ORAL

## 2015-03-05 NOTE — ED Notes (Signed)
Patient transported to CT 

## 2015-03-05 NOTE — ED Notes (Signed)
He just had b.m. In triage toilet which he allowed me to view.  His b.m. Consisted of several sm. Brown particles with some bright red blood observed on paper (only).

## 2015-03-05 NOTE — ED Notes (Signed)
Pt in CT.

## 2015-03-05 NOTE — ED Notes (Signed)
Pt is aware we need a urine sample. Pt was provided Urinal and asked to notify when he is able to void.

## 2015-03-05 NOTE — H&P (Signed)
[facility] History and Physical  Ardith Lewman AVW:[id number] DOB: 21-Feb-1966 DOA: 03/05/2015  Referring physician: Ranae Palms PCP: No PCP Per Patient   Chief Complaint: abdominal discomfort  HPI: Douglas [name] is a 49 y.o. male  With history of hypertension who presented to the hospital with complaints of abdominal discomfort. The pain has been present for the last month. But he reports that within the last week the pain has been persistent. Since onset has been gradually getting worse. He states that the pain was enough for him to come to the hospital for further evaluation. Nothing he is aware of makes it better or worse. He does report chills at night but denies any fevers. He denies any sick contacts. He feels like he is losing weight without trying but is not aware of how much weight.   Review of Systems:  Constitutional:  No weight loss, night sweats, Fevers, chills, fatigue.  HEENT:  No headaches, Difficulty swallowing,Tooth/dental problems,Sore throat,  No sneezing, itching, ear ache, nasal congestion, post nasal drip,  Cardio-vascular:  No chest pain, Orthopnea, PND, swelling in lower extremities, anasarca, dizziness, palpitations  GI:  No heartburn, indigestion, + abdominal pain, nausea, vomiting, diarrhea, + change in bowel habits, loss of appetite  Resp:  No shortness of breath with exertion or at rest. No excess mucus, no productive cough, No non-productive cough, No coughing up of blood.No change in color of mucus.No wheezing.No chest wall deformity  Skin:  no rash or lesions.  GU:  no dysuria, change in color of urine, no urgency or frequency. No flank pain.  Musculoskeletal:  No joint pain or swelling. No decreased range of motion. No back pain.  Psych:  No change in mood or affect. No depression or anxiety. No memory loss.   Past Medical History  Diagnosis Date  . Tobacco abuse   . Coronary artery disease   . Anginal pain (HCC)   . GERD  (gastroesophageal reflux disease)   . NSTEMI (non-ST elevated myocardial infarction) (HCC) 01/2014   Past Surgical History  Procedure Laterality Date  . Coronary angioplasty with stent placement  01/18/2014    "1"  . Left heart catheterization with coronary angiogram N/A 01/18/2014    Procedure: LEFT HEART CATHETERIZATION WITH CORONARY ANGIOGRAM;  Surgeon: Runell Gess, MD;  Location: [facility] CATH LAB;  Service: Cardiovascular;  Laterality: N/A;   Social History:  reports that he has been smoking Cigarettes.  He has a 31 pack-year smoking history. He has never used smokeless tobacco. He reports that he drinks about 12.6 oz of alcohol per week. He reports that he uses illicit drugs (Marijuana).  No Known Allergies  Family history  - negative per my discussion with patient.  Prior to Admission medications   Medication Sig Start Date End Date Taking? Authorizing Provider  aspirin 81 MG chewable tablet Chew 1 tablet (81 mg total) by mouth daily. 01/19/14  Yes Othella Boyer, MD  ibuprofen (ADVIL,MOTRIN) 200 MG tablet Take 600 mg by mouth every 6 (six) hours as needed for moderate pain.   Yes Historical Provider, MD  ibuprofen (ADVIL,MOTRIN) 800 MG tablet Take 1 tablet (800 mg total) by mouth 3 (three) times daily. Patient not taking: Reported on 03/05/2015 07/11/14   Fayrene Helper, PA-C  metoprolol tartrate (LOPRESSOR) 25 MG tablet Take 1 tablet (25 mg total) by mouth 2 (two) times daily. Patient not taking: Reported on 03/05/2015 01/19/14   Othella Boyer, MD  nitroGLYCERIN (NITROSTAT) 0.4 MG SL tablet Place 1 tablet (  0.4 mg total) under the tongue every 5 (five) minutes x 3 doses as needed for chest pain. Patient not taking: Reported on 03/05/2015 01/19/14   Othella Boyer, MD  penicillin v potassium (VEETID) 500 MG tablet Take 1 tablet (500 mg total) by mouth 3 (three) times daily. Patient not taking: Reported on 03/05/2015 07/11/14   Fayrene Helper, PA-C  rosuvastatin (CRESTOR) 10 MG tablet Take  1 tablet (10 mg total) by mouth daily. Patient not taking: Reported on 03/05/2015 01/19/14   Othella Boyer, MD  ticagrelor (BRILINTA) 90 MG TABS tablet Take 1 tablet (90 mg total) by mouth 2 (two) times daily. Patient not taking: Reported on 03/05/2015 01/19/14   Othella Boyer, MD   Physical Exam: Filed Vitals:   03/05/15 1045 03/05/15 1331  BP: 156/104 155/81  Pulse: 114 93  Temp: 98 F (36.7 C) 98.2 F (36.8 C)  TempSrc: Oral Oral  Resp: 20 19  SpO2: 100% 98%    Wt Readings from Last 3 Encounters:  01/19/14 66 kg (145 lb 8.1 oz)    General:  Appears calm and comfortable Eyes: PERRL, normal lids, irises & conjunctiva ENT: grossly normal hearing, lips & tongue Neck: no LAD, masses or thyromegaly Cardiovascular: RRR, no m/r/g. No LE edema. Telemetry: SR, no arrhythmias  Respiratory: CTA bilaterally, no w/r/r. Normal respiratory effort. Abdomen: discomfort with palpation over epigastric area, nd, no guarding, no rebound tenderness Skin: no rash or induration seen on limited exam Musculoskeletal: grossly normal tone BUE/BLE Psychiatric: grossly normal mood and affect, speech fluent and appropriate Neurologic: grossly non-focal.          Labs on Admission:  Basic Metabolic Panel:  Recent Labs Lab 03/05/15 1057  NA 139  K 4.1  CL 107  CO2 22  GLUCOSE 93  BUN 14  CREATININE 1.00  CALCIUM 8.5*   Liver Function Tests:  Recent Labs Lab 03/05/15 1057  AST 79*  ALT 47  ALKPHOS 107  BILITOT 0.3  PROT 6.4*  ALBUMIN 3.8    Recent Labs Lab 03/05/15 1057  LIPASE 52*   No results for input(s): AMMONIA in the last 168 hours. CBC:  Recent Labs Lab 03/05/15 1057  WBC 7.5  HGB 16.6  HCT 47.9  MCV 96.6  PLT 247   Cardiac Enzymes: No results for input(s): CKTOTAL, CKMB, CKMBINDEX, TROPONINI in the last 168 hours.  BNP (last 3 results) No results for input(s): BNP in the last 8760 hours.  ProBNP (last 3 results) No results for input(s): PROBNP in  the last 8760 hours.  CBG: No results for input(s): GLUCAP in the last 168 hours.  Radiological Exams on Admission: Ct Abdomen Pelvis W Contrast  03/05/2015  CLINICAL DATA:  History of right lower quadrant abdominal pain. EXAM: CT ABDOMEN AND PELVIS WITH CONTRAST TECHNIQUE: Multidetector CT imaging of the abdomen and pelvis was performed using the standard protocol following bolus administration of intravenous contrast. CONTRAST:  OMNIPAQUE IOHEXOL 300 MG/ML SOLN, 25mL OMNIPAQUE IOHEXOL 300 MG/ML SOLN COMPARISON:  None. FINDINGS: Lower chest:  No acute findings. Hepatobiliary: No masses or other significant abnormality. Pancreas: There is an ill-defined area of hypoattenuation within the head of the pancreas extending to the ampulla. No evidence of biliary or pancreatic ductal dilation. Mild adjacent fat stranding and tiny retroperitoneal lymph nodes are seen. The body and tail of the pancreas have normal appearance. The adjacent vascularity has normal appearance. Spleen: Within normal limits in size and appearance. Adrenals/Urinary Tract: No masses identified.  No evidence of hydronephrosis. Stomach/Bowel: No evidence of obstruction, inflammatory process, or abnormal fluid collections. The appendix is retrocecal and normal in appearance. Vascular/Lymphatic: No pathologically enlarged lymph nodes. No evidence of abdominal aortic aneurysm. Reproductive: No mass or other significant abnormality. Other: None. Musculoskeletal:  No suspicious bone lesions identified. IMPRESSION: Ill-defined area of hypoattenuation associated with the head of the pancreas. Differential diagnosis includes pancreatic malignancy, acute focal pancreatitis or duodenitis. Further evaluation with MRCP or endoscopic evaluation should be considered. Electronically Signed   By: Ted Mcalpineobrinka  Dimitrova M.D.   On: 03/05/2015 13:12     Assessment/Plan Active Problems:   Abdominal pain/Pancreatic mass - supportive therapy - Consulted  GI and will defer further imaging studies to them - npo if no procedure planned will plan on advancing diet - MIVF while npo  Code Status: full DVT Prophylaxis: heparin Disposition Plan: Med surg for evaluation by GI specialist  Time spent: > 40 minutes  Penny PiaVEGA, Ashir Kunz [facility] Pager [phone]

## 2015-03-05 NOTE — ED Notes (Signed)
He c/o rlq area abd. Pain (not in back or flank) for some months now which has been worse for past 1 1/2 weeks.  He also states he has recently been passing only small pellet-type stools with occasional blood in them.  He is in no distress.

## 2015-03-05 NOTE — ED Provider Notes (Signed)
CSN: [id number]     Arrival date & time 03/05/15  1024 History   First MD Initiated Contact with Patient 03/05/15 1119     Chief Complaint  Patient presents with  . Abdominal Pain     (Consider location/radiation/quality/duration/timing/severity/associated sxs/prior Treatment) HPI Patient presents with one month of episodic abdominal pain has become more consistent over the last week. Has had several episodes of vomiting since onset of symptoms. Currently denies any nausea. No fever or chills. Pain is now located mostly in the right lower quadrant. States pain is sharp and has the urge to defecate. Difficulty having bowel movements passing only small pellets. No previous similar abdominal pain. No previous abdominal surgeries. Denies flank pain, hematuria, dysuria or hesitancy. Past Medical History  Diagnosis Date  . Tobacco abuse   . Coronary artery disease   . Anginal pain (HCC)   . GERD (gastroesophageal reflux disease)   . NSTEMI (non-ST elevated myocardial infarction) (HCC) 01/2014   Past Surgical History  Procedure Laterality Date  . Coronary angioplasty with stent placement  01/18/2014    "1"  . Left heart catheterization with coronary angiogram N/A 01/18/2014    Procedure: LEFT HEART CATHETERIZATION WITH CORONARY ANGIOGRAM;  Surgeon: Runell GessJonathan J Berry, MD;  [facility];  Service: Cardiovascular;  Laterality: N/A;   No family history on file. Social History  Substance Use Topics  . Smoking status: Current Every Day Smoker -- 1.00 packs/day for 31 years    Types: Cigarettes  . Smokeless tobacco: Never Used  . Alcohol Use: 12.6 oz/week    0 Standard drinks or equivalent, 21 Cans of beer per week     Comment: 01/18/2014 " ~3 beers/day"    Review of Systems  Constitutional: Positive for appetite change. Negative for fever and chills.  Respiratory: Negative for shortness of breath.   Cardiovascular: Negative for chest pain.  Gastrointestinal: Positive for vomiting,  abdominal pain and constipation. Negative for nausea, diarrhea and abdominal distention.  Genitourinary: Negative for dysuria, frequency, hematuria, flank pain and difficulty urinating.  Musculoskeletal: Negative for back pain, neck pain and neck stiffness.  Skin: Negative for rash.  Neurological: Negative for dizziness, weakness, light-headedness, numbness and headaches.  All other systems reviewed and are negative.     Allergies  Review of patient's allergies indicates no known allergies.  Home Medications   Prior to Admission medications   Medication Sig Start Date End Date Taking? Authorizing Provider  aspirin 81 MG chewable tablet Chew 1 tablet (81 mg total) by mouth daily. 01/19/14  Yes Othella BoyerWilliam S Tilley, MD  ibuprofen (ADVIL,MOTRIN) 200 MG tablet Take 600 mg by mouth every 6 (six) hours as needed for moderate pain.   Yes Historical Provider, MD  ibuprofen (ADVIL,MOTRIN) 800 MG tablet Take 1 tablet (800 mg total) by mouth 3 (three) times daily. Patient not taking: Reported on 03/05/2015 07/11/14   Fayrene HelperBowie Tran, PA-C  metoprolol tartrate (LOPRESSOR) 25 MG tablet Take 1 tablet (25 mg total) by mouth 2 (two) times daily. Patient not taking: Reported on 03/05/2015 01/19/14   Othella BoyerWilliam S Tilley, MD  nitroGLYCERIN (NITROSTAT) 0.4 MG SL tablet Place 1 tablet (0.4 mg total) under the tongue every 5 (five) minutes x 3 doses as needed for chest pain. Patient not taking: Reported on 03/05/2015 01/19/14   Othella BoyerWilliam S Tilley, MD  penicillin v potassium (VEETID) 500 MG tablet Take 1 tablet (500 mg total) by mouth 3 (three) times daily. Patient not taking: Reported on 03/05/2015 07/11/14   Greta DoomBowie  Laveda Norman, PA-C  rosuvastatin (CRESTOR) 10 MG tablet Take 1 tablet (10 mg total) by mouth daily. Patient not taking: Reported on 03/05/2015 01/19/14   Othella Boyer, MD  ticagrelor (BRILINTA) 90 MG TABS tablet Take 1 tablet (90 mg total) by mouth 2 (two) times daily. Patient not taking: Reported on 03/05/2015  01/19/14   Othella Boyer, MD   BP 155/81 mmHg  Pulse 93  Temp(Src) 98.2 F (36.8 C) (Oral)  Resp 19  SpO2 98% Physical Exam  Constitutional: He is oriented to person, place, and time. He appears well-developed and well-nourished. No distress.  HENT:  Head: Normocephalic and atraumatic.  Mouth/Throat: Oropharynx is clear and moist. No oropharyngeal exudate.  Eyes: EOM are normal. Pupils are equal, round, and reactive to light.  Neck: Normal range of motion. Neck supple.  Cardiovascular: Normal rate and regular rhythm.   Pulmonary/Chest: Effort normal and breath sounds normal. No respiratory distress. He has no wheezes. He has no rales. He exhibits no tenderness.  Abdominal: Soft. Bowel sounds are normal. He exhibits no distension and no mass. There is tenderness (mild diffuse tenderness to palpation. Appears to be worse in the right lower quadrant.). There is no rebound and no guarding.  No rebound or guarding.  Musculoskeletal: Normal range of motion. He exhibits no edema or tenderness.  No CVA tenderness bilaterally. Distal pulses intact. No calf swelling or tenderness.  Neurological: He is alert and oriented to person, place, and time.  Skin: Skin is warm and dry. No rash noted. No erythema.  Psychiatric: He has a normal mood and affect. His behavior is normal.  Nursing note and vitals reviewed.   ED Course  Procedures (including critical care time) Labs Review Labs Reviewed  LIPASE, BLOOD - Abnormal; Notable for the following:    Lipase 52 (*)    All other components within normal limits  COMPREHENSIVE METABOLIC PANEL - Abnormal; Notable for the following:    Calcium 8.5 (*)    Total Protein 6.4 (*)    AST 79 (*)    All other components within normal limits  CBC  URINALYSIS, ROUTINE W REFLEX MICROSCOPIC (NOT AT [facility])    Imaging Review Ct Abdomen Pelvis W Contrast  03/05/2015  CLINICAL DATA:  History of right lower quadrant abdominal pain. EXAM: CT ABDOMEN AND  PELVIS WITH CONTRAST TECHNIQUE: Multidetector CT imaging of the abdomen and pelvis was performed using the standard protocol following bolus administration of intravenous contrast. CONTRAST:  OMNIPAQUE IOHEXOL 300 MG/ML SOLN, 25mL OMNIPAQUE IOHEXOL 300 MG/ML SOLN COMPARISON:  None. FINDINGS: Lower chest:  No acute findings. Hepatobiliary: No masses or other significant abnormality. Pancreas: There is an ill-defined area of hypoattenuation within the head of the pancreas extending to the ampulla. No evidence of biliary or pancreatic ductal dilation. Mild adjacent fat stranding and tiny retroperitoneal lymph nodes are seen. The body and tail of the pancreas have normal appearance. The adjacent vascularity has normal appearance. Spleen: Within normal limits in size and appearance. Adrenals/Urinary Tract: No masses identified. No evidence of hydronephrosis. Stomach/Bowel: No evidence of obstruction, inflammatory process, or abnormal fluid collections. The appendix is retrocecal and normal in appearance. Vascular/Lymphatic: No pathologically enlarged lymph nodes. No evidence of abdominal aortic aneurysm. Reproductive: No mass or other significant abnormality. Other: None. Musculoskeletal:  No suspicious bone lesions identified. IMPRESSION: Ill-defined area of hypoattenuation associated with the head of the pancreas. Differential diagnosis includes pancreatic malignancy, acute focal pancreatitis or duodenitis. Further evaluation with MRCP or endoscopic evaluation should  be considered. Electronically Signed   By: Ted Mcalpine M.D.   On: 03/05/2015 13:12   I have personally reviewed and evaluated these images and lab results as part of my medical decision-making.   EKG Interpretation None      MDM   Final diagnoses:  Generalized abdominal pain    Patient with ongoing abdominal pain for one month. Low suspicion for appendicitis or surgical source of the pain. Will get CT  abdomen/pelvis  Discussed with patient the results of the CT scan. [facility] will admit to a MedSurg bed for further workup. Patient states the morphine improved the pain but is a sense return. Will re-dose.  Loren Racer, MD 03/05/15 [phone]

## 2015-03-06 DIAGNOSIS — F17200 Nicotine dependence, unspecified, uncomplicated: Secondary | ICD-10-CM

## 2015-03-06 DIAGNOSIS — K859 Acute pancreatitis without necrosis or infection, unspecified: Secondary | ICD-10-CM

## 2015-03-06 DIAGNOSIS — R933 Abnormal findings on diagnostic imaging of other parts of digestive tract: Secondary | ICD-10-CM

## 2015-03-06 DIAGNOSIS — R1033 Periumbilical pain: Secondary | ICD-10-CM

## 2015-03-06 LAB — CBC
HEMATOCRIT: 48 % (ref 39.0–52.0)
HEMOGLOBIN: 15.9 g/dL (ref 13.0–17.0)
MCH: 32.4 pg (ref 26.0–34.0)
MCHC: 33.1 g/dL (ref 30.0–36.0)
MCV: 97.8 fL (ref 78.0–100.0)
Platelets: 241 10*3/uL (ref 150–400)
RBC: 4.91 MIL/uL (ref 4.22–5.81)
RDW: 13 % (ref 11.5–15.5)
WBC: 6.8 10*3/uL (ref 4.0–10.5)

## 2015-03-06 LAB — BASIC METABOLIC PANEL
ANION GAP: 7 (ref 5–15)
BUN: 6 mg/dL (ref 6–20)
CALCIUM: 8.6 mg/dL — AB (ref 8.9–10.3)
CHLORIDE: 104 mmol/L (ref 101–111)
CO2: 29 mmol/L (ref 22–32)
Creatinine, Ser: 0.91 mg/dL (ref 0.61–1.24)
GFR calc non Af Amer: >60 mL/min (ref >60)
GLUCOSE: 106 mg/dL — AB (ref 65–99)
POTASSIUM: 4.2 mmol/L (ref 3.5–5.1)
Sodium: 140 mmol/L (ref 135–145)

## 2015-03-06 MED ORDER — MORPHINE SULFATE (PF) 2 MG/ML IV SOLN
2.0000 mg | INTRAVENOUS | Status: DC | PRN
  Administered 2015-03-06 – 2015-03-08 (×11): 2 mg via INTRAVENOUS
  Filled 2015-03-06 (×11): qty 1

## 2015-03-06 NOTE — Consult Note (Signed)
Consultation  Referring Provider:   [facility]  Hospitalist Primary Care Physician:  No PCP Per Patient Primary Gastroenterologist:         unassigned  Reason for Consultation:     Abdominal pain, abnormal imaging findings on CT abdomen and pelvis            HPI:   Douglas [name] is a 49 y.o. male  admitted to hospital with complaints of abdominal pain progressively worse in the past 3-4 months, he was initially having intermittent pain that started about 3 or 4 months ago back in the past 2 weeks his continuous and increased in intensity. Denies any nausea vomiting or diarrhea . He drinks 3x40 ounce beer per day  and has increased his beer consumption in the past few weeks. He was also taking BC powder, Aleve or Motrin for pain control . Denies any melena or blood in stool. No family history of gallstones, pancreatic cancer or any GI cancer. He thinks he may have lost some weight in the past few months but unsure how much , he also feels his appetite is not as good as before .  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 Tobacco abuse   . Coronary artery disease   . Anginal pain (HCC)   . GERD (gastroesophageal reflux disease)   . NSTEMI (non-ST elevated myocardial infarction) (HCC) 01/2014    Past Surgical History  Procedure Laterality Date  . Coronary angioplasty with stent placement  01/18/2014    "1"  . Left heart catheterization with coronary angiogram N/A 01/18/2014    Procedure: LEFT HEART CATHETERIZATION WITH CORONARY ANGIOGRAM;  Surgeon: Runell GessJonathan J Berry, MD;  [facility];  Service: Cardiovascular;  Laterality: N/A;    No family history on file.   Social History  Substance Use Topics  . Smoking status: Current Every Day Smoker -- 1.00 packs/day for 31 years    Types: Cigarettes  . Smokeless tobacco: Never Used  . Alcohol Use: 12.6 oz/week    0 Standard drinks or equivalent, 21 Cans  of beer per week     Comment: 01/18/2014 " ~3 beers/day"    Prior to Admission medications   Medication Sig Start Date End Date Taking? Authorizing Provider  aspirin 81 MG chewable tablet Chew 1 tablet (81 mg total) by mouth daily. 01/19/14  Yes Othella BoyerWilliam S Tilley, MD  ibuprofen (ADVIL,MOTRIN) 200 MG tablet Take 600 mg by mouth every 6 (six) hours as needed for moderate pain.   Yes Historical Provider, MD  ibuprofen (ADVIL,MOTRIN) 800 MG tablet Take 1 tablet (800 mg total) by mouth 3 (three) times daily. Patient not taking: Reported on 03/05/2015 07/11/14   Fayrene HelperBowie Tran, PA-C  metoprolol tartrate (LOPRESSOR) 25 MG tablet Take 1 tablet (25 mg total) by mouth 2 (two) times daily. Patient not taking: Reported on 03/05/2015 01/19/14   Othella BoyerWilliam S Tilley, MD  nitroGLYCERIN (NITROSTAT) 0.4 MG SL tablet Place 1 tablet (0.4 mg total) under the tongue every 5 (five) minutes x 3 doses as needed for chest pain. Patient not taking: Reported on 03/05/2015 01/19/14   Othella BoyerWilliam S Tilley, MD  penicillin v potassium (VEETID) 500 MG tablet Take 1 tablet (500 mg total) by mouth 3 (three) times daily. Patient not taking: Reported on 03/05/2015 07/11/14   Fayrene HelperBowie Tran, PA-C  rosuvastatin (CRESTOR) 10 MG tablet Take 1 tablet (10 mg total) by  mouth daily. Patient not taking: Reported on 03/05/2015 01/19/14   Othella Boyer, MD  ticagrelor (BRILINTA) 90 MG TABS tablet Take 1 tablet (90 mg total) by mouth 2 (two) times daily. Patient not taking: Reported on 03/05/2015 01/19/14   Othella Boyer, MD    Current Facility-Administered Medications  Medication Dose Route Frequency Provider Last Rate Last Dose  . acetaminophen (TYLENOL) tablet 650 mg  650 mg Oral Q6H PRN Penny Pia, MD       Or  . acetaminophen (TYLENOL) suppository 650 mg  650 mg Rectal Q6H PRN Penny Pia, MD      . aspirin chewable tablet 81 mg  81 mg Oral Daily Penny Pia, MD   81 mg at 03/06/15 9562  . dextrose 5 % and 0.45 % NaCl with KCl 20 mEq/L  infusion   Intravenous Continuous Penny Pia, MD 100 mL/hr at 03/06/15 0510    . heparin injection 5,000 Units  5,000 Units Subcutaneous 3 times per day Penny Pia, MD   5,000 Units at 03/06/15 0510  . hydrALAZINE (APRESOLINE) injection 10 mg  10 mg Intravenous Q6H PRN Rolan Lipa, NP   10 mg at 03/05/15 2219  . metoprolol tartrate (LOPRESSOR) tablet 25 mg  25 mg Oral BID Penny Pia, MD   25 mg at 03/06/15 1308  . morphine 2 MG/ML injection 1 mg  1 mg Intravenous Q3H PRN Penny Pia, MD   1 mg at 03/06/15 0608  . nicotine (NICODERM CQ - dosed in mg/24 hours) patch 21 mg  21 mg Transdermal Daily Penny Pia, MD   21 mg at 03/06/15 0941  . nitroGLYCERIN (NITROSTAT) SL tablet 0.4 mg  0.4 mg Sublingual Q5 Min x 3 PRN Penny Pia, MD      . ondansetron (ZOFRAN) tablet 4 mg  4 mg Oral Q6H PRN Penny Pia, MD       Or  . ondansetron (ZOFRAN) injection 4 mg  4 mg Intravenous Q6H PRN Penny Pia, MD      . oxyCODONE (Oxy IR/ROXICODONE) immediate release tablet 5 mg  5 mg Oral Q4H PRN Penny Pia, MD   5 mg at 03/06/15 6578    Allergies as of 03/05/2015  . (No Known Allergies)     Review of Systems:    This is positive for those things mentioned in the HPI,  All other review of systems are negative.       Physical Exam:  Vital signs in last 24 hours: Temp:  [97.8 F (36.6 C)-98.3 F (36.8 C)] 97.8 F (36.6 C) (12/25 0535) Pulse Rate:  [81-93] 87 (12/25 0535) Resp:  [18-19] 18 (12/25 0535) BP: (147-185)/(81-99) 158/88 mmHg (12/25 0535) SpO2:  [98 %-100 %] 100 % (12/25 0535) Weight:  [151 lb 4.8 oz (68.629 kg)] 151 lb 4.8 oz (68.629 kg) (12/24 1308) Last BM Date: 03/05/15  General:  Well-developed, well-nourished and in no acute distress Eyes:  anicteric. ENT:   Mouth and posterior pharynx free of lesions.  Neck:   supple w/o thyromegaly or mass.  Lungs: Clear to auscultation bilaterally. Heart:  S1S2, no rubs, murmurs, gallops. Abdomen:  soft, mild diffuse  tenderness, no hepatosplenomegaly, hernia, or mass and BS+.  Lymph:  no cervical or supraclavicular adenopathy. Extremities:   no edema Skin   no rash. Neuro:  A&O x 3.  Psych:  appropriate mood and  Affect.   Data Reviewed:   LAB RESULTS:  Recent Labs  03/05/15 1057 03/06/15 0518  WBC 7.5  6.8  HGB 16.6 15.9  HCT 47.9 48.0  PLT 247 241   BMET  Recent Labs  03/05/15 1057 03/06/15 0518  NA 139 140  K 4.1 4.2  CL 107 104  CO2 22 29  GLUCOSE 93 106*  BUN 14 6  CREATININE 1.00 0.91  CALCIUM 8.5* 8.6*   LFT  Recent Labs  03/05/15 1057  PROT 6.4*  ALBUMIN 3.8  AST 79*  ALT 47  ALKPHOS 107  BILITOT 0.3   PT/INR No results for input(s): LABPROT, INR in the last 72 hours.  STUDIES: Ct Abdomen Pelvis W Contrast  03/05/2015  CLINICAL DATA:  History of right lower quadrant abdominal pain. EXAM: CT ABDOMEN AND PELVIS WITH CONTRAST TECHNIQUE: Multidetector CT imaging of the abdomen and pelvis was performed using the standard protocol following bolus administration of intravenous contrast. CONTRAST:  OMNIPAQUE IOHEXOL 300 MG/ML SOLN, 25mL OMNIPAQUE IOHEXOL 300 MG/ML SOLN COMPARISON:  None. FINDINGS: Lower chest:  No acute findings. Hepatobiliary: No masses or other significant abnormality. Pancreas: There is an ill-defined area of hypoattenuation within the head of the pancreas extending to the ampulla. No evidence of biliary or pancreatic ductal dilation. Mild adjacent fat stranding and tiny retroperitoneal lymph nodes are seen. The body and tail of the pancreas have normal appearance. The adjacent vascularity has normal appearance. Spleen: Within normal limits in size and appearance. Adrenals/Urinary Tract: No masses identified. No evidence of hydronephrosis. Stomach/Bowel: No evidence of obstruction, inflammatory process, or abnormal fluid collections. The appendix is retrocecal and normal in appearance. Vascular/Lymphatic: No pathologically enlarged lymph nodes. No  evidence of abdominal aortic aneurysm. Reproductive: No mass or other significant abnormality. Other: None. Musculoskeletal:  No suspicious bone lesions identified. IMPRESSION: Ill-defined area of hypoattenuation associated with the head of the pancreas. Differential diagnosis includes pancreatic malignancy, acute focal pancreatitis or duodenitis. Further evaluation with MRCP or endoscopic evaluation should be considered. Electronically Signed   By: Ted Mcalpine M.D.   On: 03/05/2015 13:12     PREVIOUS ENDOSCOPIES:            None    Impression / Plan:  49 year old male with chronic alcohol abuse admitted with periumbilical abdominal pain progressively worse in the past few weeks.  Reviewed labs and imaging, noted mild elevation in lipase and ill-defined area of hypoattenuation in the head of the pancreas. Likely etiology acute on chronic pancreatitis, but cannot exclude underlying pancreatic lesion/malignancy Follow-up MRCP to better define the pancreas Advised patient to abstain from alcohol Pain control when necessary  Continue IV fluids and clear liquids by mouth      Iona Beard , MD [phone] Mon-Fri 8a-5p [phone] after 5p, weekends, holidays  @  03/06/2015, 11:18 AM

## 2015-03-06 NOTE — Progress Notes (Signed)
[facility] PROGRESS NOTE  Minerva EndsJonathan Maisel ZOX:[id number] DOB: [birth year] DOA: 03/05/2015 PCP: No PCP Per Patient  Assessment/Plan: Active Problems:   Abdominal pain/Pancreatic mass - Will increase pain regimen as patient is currently complaining of discomfort. - Discussed case with gastroenterologist. If MRCP negative for mass we'll plan on discharging after patient tolerates by mouth intake. If MRCP positive for mass GI to be called back by nursing for further evaluation and recommendations.  Nicotine dependence - continue nicotine patch  Code Status: full Family Communication: No family at bedside. Discussed directly  Disposition Plan: pending results of work up   Consultants:  [facility]  Procedures:  none  Antibiotics:  None  HPI/Subjective: Pt has no new complaints  Objective: Filed Vitals:   03/06/15 0153 03/06/15 0535  BP: 160/90 158/88  Pulse:  87  Temp:  97.8 F (36.6 C)  Resp:  18    Intake/Output Summary (Last 24 hours) at 03/06/15 1128 Last data filed at 03/06/15 0510  Gross per 24 hour  Intake    720 ml  Output   1850 ml  Net  -1130 ml   Filed Weights   03/05/15 1308  Weight: 68.629 kg (151 lb 4.8 oz)    Exam:   General:  Pt in nad, alert and awake  Cardiovascular: rrr, no mrg  Respiratory: cta bl, no wheezes  Abdomen: soft, NT, ND  Musculoskeletal: no cyanosis or clubbing   Data Reviewed: Basic Metabolic Panel:  Recent Labs Lab 03/05/15 1057 03/06/15 0518  NA 139 140  K 4.1 4.2  CL 107 104  CO2 22 29  GLUCOSE 93 106*  BUN 14 6  CREATININE 1.00 0.91  CALCIUM 8.5* 8.6*   Liver Function Tests:  Recent Labs Lab 03/05/15 1057  AST 79*  ALT 47  ALKPHOS 107  BILITOT 0.3  PROT 6.4*  ALBUMIN 3.8    Recent Labs Lab 03/05/15 1057  LIPASE 52*   No results for input(s): AMMONIA in the last 168 hours. CBC:  Recent Labs Lab 03/05/15 1057 03/06/15 0518  WBC 7.5 6.8  HGB 16.6 15.9  HCT 47.9 48.0   MCV 96.6 97.8  PLT 247 241   Cardiac Enzymes: No results for input(s): CKTOTAL, CKMB, CKMBINDEX, TROPONINI in the last 168 hours. BNP (last 3 results) No results for input(s): BNP in the last 8760 hours.  ProBNP (last 3 results) No results for input(s): PROBNP in the last 8760 hours.  CBG: No results for input(s): GLUCAP in the last 168 hours.  No results found for this or any previous visit (from the past 240 hour(s)).   Studies: Ct Abdomen Pelvis W Contrast  03/05/2015  CLINICAL DATA:  History of right lower quadrant abdominal pain. EXAM: CT ABDOMEN AND PELVIS WITH CONTRAST TECHNIQUE: Multidetector CT imaging of the abdomen and pelvis was performed using the standard protocol following bolus administration of intravenous contrast. CONTRAST:  100mL OMNIPAQUE IOHEXOL 300 MG/ML SOLN, 25mL OMNIPAQUE IOHEXOL 300 MG/ML SOLN COMPARISON:  None. FINDINGS: Lower chest:  No acute findings. Hepatobiliary: No masses or other significant abnormality. Pancreas: There is an ill-defined area of hypoattenuation within the head of the pancreas extending to the ampulla. No evidence of biliary or pancreatic ductal dilation. Mild adjacent fat stranding and tiny retroperitoneal lymph nodes are seen. The body and tail of the pancreas have normal appearance. The adjacent vascularity has normal appearance. Spleen: Within normal limits in size and appearance. Adrenals/Urinary Tract: No masses identified. No evidence of hydronephrosis. Stomach/Bowel: No evidence of  obstruction, inflammatory process, or abnormal fluid collections. The appendix is retrocecal and normal in appearance. Vascular/Lymphatic: No pathologically enlarged lymph nodes. No evidence of abdominal aortic aneurysm. Reproductive: No mass or other significant abnormality. Other: None. Musculoskeletal:  No suspicious bone lesions identified. IMPRESSION: Ill-defined area of hypoattenuation associated with the head of the pancreas. Differential diagnosis  includes pancreatic malignancy, acute focal pancreatitis or duodenitis. Further evaluation with MRCP or endoscopic evaluation should be considered. Electronically Signed   By: Ted Mcalpine M.D.   On: 03/05/2015 13:12    Scheduled Meds: . aspirin  81 mg Oral Daily  . heparin  5,000 Units Subcutaneous 3 times per day  . metoprolol tartrate  25 mg Oral BID  . nicotine  21 mg Transdermal Daily   Continuous Infusions: . dextrose 5 % and 0.45 % NaCl with KCl 20 mEq/L 100 mL/hr at 03/06/15 0510     Time spent: > 35 minutes    Penny Pia  [facility] Pager [phone] If 7PM-7AM, please contact night-coverage at [email or web address], password [facility] 03/06/2015, 11:28 AM  LOS: 1 day

## 2015-03-07 ENCOUNTER — Inpatient Hospital Stay (HOSPITAL_COMMUNITY): Payer: Self-pay

## 2015-03-07 MED ORDER — GADOBENATE DIMEGLUMINE 529 MG/ML IV SOLN
15.0000 mL | Freq: Once | INTRAVENOUS | Status: AC | PRN
  Administered 2015-03-07: 13 mL via INTRAVENOUS

## 2015-03-07 NOTE — Care Management Note (Signed)
Case Management Note  Patient Details  Name: Douglas EndsJonathan [name] MRN: [id number] Date of Birth: Jan 05, 1966  Subjective/Objective: 49 y/o m admitted w/abd pain. ?Pancreatic mass. ERCP. From home.                  Action/Plan:d/c plan home.   Expected Discharge Date:                  Expected Discharge Plan:  Home/Self Care  In-House Referral:     Discharge planning Services  CM Consult  Post Acute Care Choice:    Choice offered to:     DME Arranged:    DME Agency:     HH Arranged:    HH Agency:     Status of Service:  In process, will continue to follow  Medicare Important Message Given:    Date Medicare IM Given:    Medicare IM give by:    Date Additional Medicare IM Given:    Additional Medicare Important Message give by:     If discussed at Long Length of Stay Meetings, dates discussed:    Additional Comments:  Lanier ClamMahabir, Basya Casavant, RN 03/07/2015, 3:37 PM

## 2015-03-07 NOTE — Progress Notes (Signed)
Gastroenterology Progress Note  Subjective:   Aggravated that MRI may not be done until tomorrow and asking about going home because he can't miss another day of work and doesn't want to lose his job, but yet is asking for IV pain mediation regularly even before dose is due.  Objective:  Vital signs in last 24 hours: Temp:  [97.1 F (36.2 C)-98.1 F (36.7 C)] 98.1 F (36.7 C) (12/26 0534) Pulse Rate:  [78-90] 90 (12/26 0534) Resp:  [18-19] 18 (12/26 0534) BP: (137-150)/(87-98) 137/87 mmHg (12/26 0534) SpO2:  [99 %-100 %] 100 % (12/26 0534) Last BM Date: 03/05/15 General:  Alert, Well-developed, in NAD Heart:  Regular rate and rhythm; no murmurs Pulm:  CTAB.  No W/R/R. Abdomen:  Soft, non-distended. Normal bowel sounds.  Mid and upper abdominal TTP. Extremities:  Without edema. Neurologic:  Alert and oriented x 4;  grossly normal neurologically. Psych:  Alert and cooperative. Normal mood and affect.  Intake/Output from previous day: 12/25 0701 - 12/26 0700 In: 1560 [P.O.:1560] Out: 2650 [Urine:2650]  Lab Results:  Recent Labs  03/05/15 1057 03/06/15 0518  WBC 7.5 6.8  HGB 16.6 15.9  HCT 47.9 48.0  PLT 247 241   BMET  Recent Labs  03/05/15 1057 03/06/15 0518  NA 139 140  K 4.1 4.2  CL 107 104  CO2 22 29  GLUCOSE 93 106*  BUN 14 6  CREATININE 1.00 0.91  CALCIUM 8.5* 8.6*   LFT  Recent Labs  03/05/15 1057  PROT 6.4*  ALBUMIN 3.8  AST 79*  ALT 47  ALKPHOS 107  BILITOT 0.3   Ct Abdomen Pelvis W Contrast  03/05/2015  CLINICAL DATA:  History of right lower quadrant abdominal pain. EXAM: CT ABDOMEN AND PELVIS WITH CONTRAST TECHNIQUE: Multidetector CT imaging of the abdomen and pelvis was performed using the standard protocol following bolus administration of intravenous contrast. CONTRAST:  100mL OMNIPAQUE IOHEXOL 300 MG/ML SOLN, 25mL OMNIPAQUE IOHEXOL 300 MG/ML SOLN COMPARISON:  None. FINDINGS: Lower chest:  No acute findings. Hepatobiliary:  No masses or other significant abnormality. Pancreas: There is an ill-defined area of hypoattenuation within the head of the pancreas extending to the ampulla. No evidence of biliary or pancreatic ductal dilation. Mild adjacent fat stranding and tiny retroperitoneal lymph nodes are seen. The body and tail of the pancreas have normal appearance. The adjacent vascularity has normal appearance. Spleen: Within normal limits in size and appearance. Adrenals/Urinary Tract: No masses identified. No evidence of hydronephrosis. Stomach/Bowel: No evidence of obstruction, inflammatory process, or abnormal fluid collections. The appendix is retrocecal and normal in appearance. Vascular/Lymphatic: No pathologically enlarged lymph nodes. No evidence of abdominal aortic aneurysm. Reproductive: No mass or other significant abnormality. Other: None. Musculoskeletal:  No suspicious bone lesions identified. IMPRESSION: Ill-defined area of hypoattenuation associated with the head of the pancreas. Differential diagnosis includes pancreatic malignancy, acute focal pancreatitis or duodenitis. Further evaluation with MRCP or endoscopic evaluation should be considered. Electronically Signed   By: Ted Mcalpineobrinka  Dimitrova M.D.   On: 03/05/2015 13:12   Assessment / Plan: *49 year old male with chronic alcohol abuse admitted with periumbilical abdominal pain progressively worse in the past few weeks.  Reviewed labs and imaging, noted mild elevation in lipase and ill-defined area of hypoattenuation in the head of the pancreas.  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  -Follow-up MRCP to better define the pancreas. -Advised patient to abstain from alcohol. -Pain control when necessary.  -Continue  IV fluids and clear liquids by mouth.   LOS: 2 days   ZEHR, JESSICA D.  03/07/2015, 9:13 AM  Pager number [phone]    Attending physician's note   I have taken an interval  history, reviewed the chart and examined the patient. I agree with the Advanced Practitioner's note, impression and recommendations.   Iona Beard, MD [phone] Mon-Fri 8a-5p [phone] after 5p, weekends, holidays

## 2015-03-07 NOTE — Progress Notes (Signed)
[facility] PROGRESS NOTE  Douglas EndsJonathan [name] ZOX:[id number] DOB: 05-Jul-1965 DOA: 03/05/2015 PCP: No PCP Per Patient  Assessment/Plan: Active Problems:   Abdominal pain/Pancreatic mass - Will increase pain regimen as patient is currently complaining of discomfort. - Discussed case with gastroenterologist. If MRCP negative for mass we'll plan on discharging after patient tolerates by mouth intake. If MRCP positive for mass GI to be called back by nursing for further evaluation and recommendations.   - Still awaiting MRCP results. Changed order to stat  Nicotine dependence - continue nicotine patch  Code Status: full Family Communication: No family at bedside. Discussed directly  Disposition Plan: pending results of work up   Consultants:  [employer] [facility]  Procedures:  none  Antibiotics:  None  HPI/Subjective: Pt has no new complaints. Still having abdominal discomfort but pain medication helping him.  Objective: Filed Vitals:   03/07/15 0534 03/07/15 1552  BP: 137/87 152/105  Pulse: 90 83  Temp: 98.1 F (36.7 C) 98.1 F (36.7 C)  Resp: 18 18    Intake/Output Summary (Last 24 hours) at 03/07/15 1646 Last data filed at 03/07/15 1241  Gross per 24 hour  Intake    960 ml  Output   3400 ml  Net  -2440 ml   Filed Weights   03/05/15 1308  Weight: 68.629 kg (151 lb 4.8 oz)    Exam:   General:  Pt in nad, alert and awake  Cardiovascular: rrr, no mrg  Respiratory: cta bl, no wheezes  Abdomen: soft, NT, ND  Musculoskeletal: no cyanosis or clubbing   Data Reviewed: Basic Metabolic Panel:  Recent Labs Lab 03/05/15 1057 03/06/15 0518  NA 139 140  K 4.1 4.2  CL 107 104  CO2 22 29  GLUCOSE 93 106*  BUN 14 6  CREATININE 1.00 0.91  CALCIUM 8.5* 8.6*   Liver Function Tests:  Recent Labs Lab 03/05/15 1057  AST 79*  ALT 47  ALKPHOS 107  BILITOT 0.3  PROT 6.4*  ALBUMIN 3.8    Recent Labs Lab 03/05/15 1057  LIPASE 52*   No results for  input(s): AMMONIA in the last 168 hours. CBC:  Recent Labs Lab 03/05/15 1057 03/06/15 0518  WBC 7.5 6.8  HGB 16.6 15.9  HCT 47.9 48.0  MCV 96.6 97.8  PLT 247 241   Cardiac Enzymes: No results for input(s): CKTOTAL, CKMB, CKMBINDEX, TROPONINI in the last 168 hours. BNP (last 3 results) No results for input(s): BNP in the last 8760 hours.  ProBNP (last 3 results) No results for input(s): PROBNP in the last 8760 hours.  CBG: No results for input(s): GLUCAP in the last 168 hours.  No results found for this or any previous visit (from the past 240 hour(s)).   Studies: No results found.  Scheduled Meds: . aspirin  81 mg Oral Daily  . heparin  5,000 Units Subcutaneous 3 times per day  . metoprolol tartrate  25 mg Oral BID  . nicotine  21 mg Transdermal Daily   Continuous Infusions: . dextrose 5 % and 0.45 % NaCl with KCl 20 mEq/L 100 mL/hr at 03/07/15 0953     Time spent: > 35 minutes    Penny PiaVEGA, Douglas [name]  [facility] Pager [phone] If 7PM-7AM, please contact night-coverage at [email or web address], password [email or web address] 03/07/2015, 4:46 PM  LOS: 2 days

## 2015-03-07 NOTE — Progress Notes (Signed)
Initial Nutrition Assessment  DOCUMENTATION CODES:   Not applicable  INTERVENTION:  -Diet Advancement per MD -Ensure Enlive po BID, each supplement provides 350 kcal and 20 grams of protein w/ advancement  NUTRITION DIAGNOSIS:   Inadequate oral intake related to poor appetite, nausea, vomiting as evidenced by per patient/family report.  GOAL:   Patient will meet greater than or equal to 90% of their needs  MONITOR:   PO intake, I & O's, Labs, Diet advancement  REASON FOR ASSESSMENT:   Malnutrition Screening Tool    ASSESSMENT:   Douglas [name] is a 49 yo male With history of hypertension who presented to the hospital with complaints of abdominal discomfort. The pain has been present for the last month. But he reports that within the last week the pain has been persistent. Since onset has been gradually getting worse. He states that the pain was enough for him to come to the hospital for further evaluation. Nothing he is aware of makes it better or worse. He does report chills at night but denies any fevers. He denies any sick contacts. He feels like he is losing weight without trying but is not aware of how much weight  Spoke with pt at bedside. Pt reports poor po for approximately 2-[redacted] wks along with constant nausea, some vomiting. Admits to vomiting bile in the mild of the night 2-3x/wk. Encouraged pt to try chamomile or ginger tea to help alleviate this. Pt admitted to having a familial hx of alcoholism and that he "likes to drink" and likes beer. Attending physician told him he would have to give up beer and fried foods, to his dismay. Encouraged pt to avoid fried foods, and eat smaller/more frequent meals.  Pt reports using milk of magnesia to alleviate his abd pain. He then went to 4x200mg  ibuprofen when his ab pain could not be alleviated by milk of magnesia.  RD told pt that a mass on the pancreas could interfere with the gallblader and cause bile to be vomitted. He also  reports a hx of acid-reflux which he alleviated with generic brand prilosec-OTC - another possible cause.  Nutrition-Focused physical exam completed. Findings are no fat depletion, no muscle depletion, and no edema.   Labs and medications reviewed.  Diet Order:  Diet clear liquid Room service appropriate?: Yes; Fluid consistency:: Thin  Skin:  Reviewed, no issues  Last BM:  03/05/2015  Height:   Ht Readings from Last 1 Encounters:  03/05/15 5\' 11"  (1.803 m)    Weight:   Wt Readings from Last 1 Encounters:  03/05/15 151 lb 4.8 oz (68.629 kg)    Ideal Body Weight:  78.18 kg  BMI:  Body mass index is 21.11 kg/(m^2).  Estimated Nutritional Needs:   Kcal:  1700-2050 calories  Protein:  60-70 grams  Fluid:  >/= 1.7L  EDUCATION NEEDS:   Education needs addressed  Douglas AnoWilliam M. Jarvis Knodel, MS, RD LDN After Hours/Weekend Pager [phone]

## 2015-03-08 ENCOUNTER — Encounter (HOSPITAL_COMMUNITY): Payer: Self-pay

## 2015-03-08 LAB — HEPATIC FUNCTION PANEL
ALT: 35 U/L (ref 17–63)
AST: 39 U/L (ref 15–41)
Albumin: 3.6 g/dL (ref 3.5–5.0)
Alkaline Phosphatase: 105 U/L (ref 38–126)
BILIRUBIN DIRECT: 0.2 mg/dL (ref 0.1–0.5)
BILIRUBIN TOTAL: 1 mg/dL (ref 0.3–1.2)
Indirect Bilirubin: 0.8 mg/dL (ref 0.3–0.9)
TOTAL PROTEIN: 6.4 g/dL — AB (ref 6.5–8.1)

## 2015-03-08 LAB — CBC
HCT: 49 % (ref 39.0–52.0)
HEMOGLOBIN: 16.5 g/dL (ref 13.0–17.0)
MCH: 33.3 pg (ref 26.0–34.0)
MCHC: 33.7 g/dL (ref 30.0–36.0)
MCV: 98.8 fL (ref 78.0–100.0)
Platelets: 223 10*3/uL (ref 150–400)
RBC: 4.96 MIL/uL (ref 4.22–5.81)
RDW: 12.7 % (ref 11.5–15.5)
WBC: 7.3 10*3/uL (ref 4.0–10.5)

## 2015-03-08 MED ORDER — HYDROCODONE-ACETAMINOPHEN 5-325 MG PO TABS
1.0000 | ORAL_TABLET | ORAL | Status: DC | PRN
  Administered 2015-03-08 – 2015-03-10 (×11): 2 via ORAL
  Filled 2015-03-08 (×11): qty 2

## 2015-03-08 NOTE — Progress Notes (Signed)
Progress Note   Subjective  Patient reports feeling improved since admission but continues to have intermittent pain requiring narcotic use. He reports he does have an appetite at this time and wants to eat. Currently tolerating clear liquids. He otherwise reports ongoing changes in his bowel habits, and rare blood in the stools which is a chronic issues. He endorses anywhere from 2-4 40oz beers per day, and ongoing tobacco use. He denies any withdrawal symptoms since admission. Last drink was Friday.    Objective   Vital signs in last 24 hours: Temp:  [97.6 F (36.4 C)-98.3 F (36.8 C)] 98.3 F (36.8 C) (12/27 0513) Pulse Rate:  [83-92] 92 (12/27 0513) Resp:  [18] 18 (12/27 0513) BP: (123-153)/(75-105) 123/75 mmHg (12/27 0513) SpO2:  [99 %-100 %] 100 % (12/27 0513) Weight:  [151 lb (68.493 kg)] 151 lb (68.493 kg) (12/26 1317) Last BM Date: 03/05/15 General:    white male in NAD Heart:  Regular rate and rhythm; no murmurs Lungs: Respirations even and unlabored, lungs CTA bilaterally Abdomen:  Soft, mild periumbilical TTP without rebound or guarding and nondistended. Normal bowel sounds. Extremities:  Without edema. Neurologic:  Alert and oriented,  grossly normal neurologically. Psych:  Cooperative. Normal mood and affect.  Intake/Output from previous day: 12/26 0701 - 12/27 0700 In: 2780 [P.O.:480; I.V.:2300] Out: 1450 [Urine:1450] Intake/Output this shift: Total I/O In: 480 [P.O.:480] Out: -   Lab Results:  Recent Labs  03/05/15 1057 03/06/15 0518  WBC 7.5 6.8  HGB 16.6 15.9  HCT 47.9 48.0  PLT 247 241   BMET  Recent Labs  03/05/15 1057 03/06/15 0518  NA 139 140  K 4.1 4.2  CL 107 104  CO2 22 29  GLUCOSE 93 106*  BUN 14 6  CREATININE 1.00 0.91  CALCIUM 8.5* 8.6*   LFT  Recent Labs  03/05/15 1057  PROT 6.4*  ALBUMIN 3.8  AST 79*  ALT 47  ALKPHOS 107  BILITOT 0.3   PT/INR No results for input(s): LABPROT, INR in the last 72  hours.  Studies/Results: Mr 3d Recon At Scanner  03/08/2015  CLINICAL DATA:  49 year old male with history of right lower quadrant abdominal pain for the past several years. Nausea. Possible lesion in the head of the pancreas noted on recent CT examination. EXAM: MRI ABDOMEN WITHOUT AND WITH CONTRAST (INCLUDING MRCP) TECHNIQUE: Multiplanar multisequence MR imaging of the abdomen was performed both before and after the administration of intravenous contrast. Heavily T2-weighted images of the biliary and pancreatic ducts were obtained, and three-dimensional MRCP images were rendered by post processing. CONTRAST:  13mL MULTIHANCE GADOBENATE DIMEGLUMINE 529 MG/ML IV SOLN COMPARISON:  No priors.  CT the abdomen and pelvis 03/05/2015. FINDINGS: Lower chest:  Unremarkable. Hepatobiliary: Mild diffuse loss of signal intensity throughout the hepatic parenchyma on out of phase dual echo images, compatible with mild hepatic steatosis. No suspicious cystic or solid hepatic lesions are noted. MRCP images demonstrate very mild intra and extrahepatic biliary ductal dilatation, with the common bile duct measuring up to 10 mm in the porta hepatis. The distal common bile duct is remarkable for a tapered focal narrowing shortly before the ampulla. No filling defect is noted within the distal duct to suggest presence of a retained stone. No definite stones within the gallbladder. Fundus of the gallbladder is folded upon itself creating a "pseudolesion" which appears to be within segment 4B of the liver, but is actually part of the gallbladder. Pancreas: In the head  of the pancreas lateral to the distal common bile duct between the distal duct and the adjacent second and third portions of the duodenum there is some increased T2 signal intensity, best appreciated on image 29 of series 10. This is rather amorphous, and corresponds with generalized decreased T1 signal intensity and decreased enhancement on post gadolinium images.  Increased T2 signal intensity is noted throughout the surrounding soft tissues adjacent to the head of the pancreas and the adjacent duodenum, suggesting inflammatory changes. 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 Notably, MRCP images demonstrate divisum anatomy (type 1). Main pancreatic duct is non dilated. 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 Spleen: Unremarkable. Adrenals/Urinary Tract: Bilateral adrenal glands and bilateral kidneys are normal in appearance. No hydroureteronephrosis in the visualized abdomen. Stomach/Bowel: Visualized portions are unremarkable. Vascular/Lymphatic: No aneurysm identified in the visualized abdominal vasculature. No lymphadenopathy noted in the visualized portions of the abdomen. Other: Inflammatory changes adjacent to the head of the pancreas and the duodenum. No larger volume of ascites. No pneumoperitoneum Musculoskeletal: No aggressive osseous lesions are noted in the visualized portions of the skeleton. IMPRESSION: 1. 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 The possibility of an inflammatory stricture in this region should be considered given the mild proximal intra and extrahepatic biliary ductal dilatation. No ductal stones are identified. The possibility of an underlying mass is not entirely excluded, and attention on follow-up imaging should be considered after resolution of the patient's acute pancreatitis to exclude underlying neoplasm. Specifically, repeat MRI of the abdomen with and without MRCP should be considered in 3 months to ensure normalization of these findings.  Alternatively, this area could be directly visualized with endoscopic ultrasound if ERCP is performed. 2. Type 1 pancreatic divisum anatomy. 3. Mild hepatic steatosis. Electronically Signed   By: Trudie Reed M.D.   On: 03/08/2015 08:28   Mr Roe Coombs W/wo Cm/mrcp  03/08/2015  CLINICAL DATA:  49 year old male with history of right lower quadrant abdominal pain for the past several years. Nausea. Possible lesion in the head of the pancreas noted on recent CT examination. EXAM: MRI ABDOMEN WITHOUT AND WITH CONTRAST (INCLUDING MRCP) TECHNIQUE: Multiplanar multisequence MR imaging of the abdomen was performed both before and after the administration of intravenous contrast. Heavily T2-weighted images of the biliary and pancreatic ducts were obtained, and three-dimensional MRCP images were rendered by post processing. CONTRAST:  13mL MULTIHANCE GADOBENATE DIMEGLUMINE 529 MG/ML IV SOLN COMPARISON:  No priors.  CT the abdomen and pelvis 03/05/2015. FINDINGS: Lower chest:  Unremarkable. Hepatobiliary: Mild diffuse loss of signal intensity throughout the hepatic parenchyma on out of phase dual echo images, compatible with mild hepatic steatosis. No suspicious cystic or solid hepatic lesions are noted. MRCP images demonstrate very mild intra and extrahepatic biliary ductal dilatation, with the common bile duct measuring up to 10 mm in the porta hepatis. The distal common bile duct is remarkable for a tapered focal narrowing shortly before the ampulla. No filling defect is noted within the distal duct to suggest presence of a retained stone. No definite stones within the gallbladder. Fundus of the gallbladder is folded upon itself creating a "pseudolesion" which appears to be within  segment 4B of the liver, but is actually part of the gallbladder. Pancreas: In the head of the pancreas lateral to the distal common bile duct between the distal duct and the adjacent second and third portions of the duodenum there is some  increased T2 signal intensity, best appreciated on image 29 of series 10. This is rather amorphous, and corresponds with generalized decreased T1 signal intensity and decreased enhancement on post gadolinium images. Increased T2 signal intensity is noted throughout the surrounding soft tissues adjacent to the head of the pancreas and the adjacent duodenum, suggesting inflammatory changes. 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 Notably, MRCP images demonstrate divisum anatomy (type 1). Main pancreatic duct is non dilated. 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 Spleen: Unremarkable. Adrenals/Urinary Tract: Bilateral adrenal glands and bilateral kidneys are normal in appearance. No hydroureteronephrosis in the visualized abdomen. Stomach/Bowel: Visualized portions are unremarkable. Vascular/Lymphatic: No aneurysm identified in the visualized abdominal vasculature. No lymphadenopathy noted in the visualized portions of the abdomen. Other: Inflammatory changes adjacent to the head of the pancreas and the duodenum. No larger volume of ascites. No pneumoperitoneum Musculoskeletal: No aggressive osseous lesions are noted in the visualized portions of the skeleton. IMPRESSION: 1. 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 The possibility of an inflammatory stricture in this region should be considered given the mild proximal intra and extrahepatic biliary ductal dilatation. No ductal stones are identified. The possibility of an underlying mass is not entirely excluded, and attention on follow-up imaging should be considered after  resolution of the patient's acute pancreatitis to exclude underlying neoplasm. Specifically, repeat MRI of the abdomen with and without MRCP should be considered in 3 months to ensure normalization of these findings. Alternatively, this area could be directly visualized with endoscopic ultrasound if ERCP is performed. 2. Type 1 pancreatic divisum anatomy. 3. Mild hepatic steatosis. Electronically Signed   By: Trudie Reed M.D.   On: 03/08/2015 08:28       Assessment / Plan:   49 y/o male with a history of alcohol and tobacco abuse admitted with abdominal pain. CT scan showed changes concerning for possible focal pancreatitis vs. Pancreatic mass lesion. MRI / MRCP obtained overnight with report as above. Radiology is favoring "groove pancreatitis", without clear mass lesion, although given the inflammation a mass cannot be definitively ruled out, but it seems less likely at this time. He has some mass affect on the CBD with some narrowing however prior LFTs were normal without evidence of obstruction. No evidence of GB stones or retained stones in the GB.   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 He does appear to be improved following restriction of diet and pain medications, now wanting to eat with an appetite. We will obtain LFTs today to ensure no obstruction given the MRCP findings. If no obstructive pattern, okay to eat a low fat diet as tolerated. We will also try to switch him to oral pain medications today. Moving forward, I do recommend an EUS for him once the acute inflammation has resolved to ensure no evidence of a mass lesion, and will discuss  this with our EUS endoscopist, and this can be done as an outpatient once he is improved (pending no evidence of biliary obstruction while hospitalized)   Otherwise we discussed tobacco and alcohol cessation with him at length today, and he understands and seems motivated to quit both. He  has fatty liver noted on imaging but no evidence of cirrhosis. He should be on a CIWA protocol until he has gotten through detox while admitted. He otherwise endorses some changes in his bowels and "floating stools", we will check stool pancreatic elastase to rule out exocrine deficiency. With his scant blood in the stools noted occasionally and bowel habit changes, he also warrants a colonoscopy but can do this as an outpatient.   He agreed with the plan, we will see LFTs today and how he tolerates a diet.   Ileene Patrick, MD [facility] Pager [phone]     Active Problems:   Abdominal pain   Pancreatic mass   pancreatitis   alcohol abuse   tobacco abuse   nonspecific abdominal imaging abnormalities   bowel habit changes   fatty liver   LOS: 3 days   Reeves Forth Monterio Bob  03/08/2015, 9:21 AM

## 2015-03-08 NOTE — Progress Notes (Signed)
[facility] PROGRESS NOTE  Douglas EndsJonathan Eve [name] DOB: [birth year] DOA: 03/05/2015 PCP: No PCP Per Patient Brief narrative: Patient is a 49 year old the presented with abdominal discomfort. He reported recently drinking a lot of alcohol and subsequently developing discomfort. Imaging studies would report pancreatitis/duodenitis versus pancreatic mass. GI consulted and assisting with further workup and evaluation.  Assessment/Plan: Active Problems:   Abdominal pain/Pancreatic mass - Improved with bowel rest patient requesting advancement in diet. Diet has been advanced and tolerating diet currently - GI on board and currently planning further workup please refer to her note for details.   Nicotine dependence - continue nicotine patch  Code Status: full Family Communication: No family at bedside. Discussed directly  Disposition Plan: pending results of work up by specialist   Consultants:  [facility]  Procedures:  none  Antibiotics:  None  HPI/Subjective: Pt has no new complaints. Still having some abdominal discomfort but tolerating diet  Objective: Filed Vitals:   03/08/15 0513 03/08/15 1351  BP: 123/75 138/98  Pulse: 92 97  Temp: 98.3 F (36.8 C) 98.3 F (36.8 C)  Resp: 18 20    Intake/Output Summary (Last 24 hours) at 03/08/15 1400 Last data filed at 03/08/15 0800  Gross per 24 hour  Intake   3020 ml  Output    700 ml  Net   2320 ml   Filed Weights   03/05/15 1308  Weight: 68.629 kg (151 lb 4.8 oz)    Exam:   General:  Pt in nad, alert and awake  Cardiovascular: rrr, no mrg  Respiratory: cta bl, no wheezes  Abdomen: soft, NT, ND  Musculoskeletal: no cyanosis or clubbing   Data Reviewed: Basic Metabolic Panel:  Recent Labs Lab 03/05/15 1057 03/06/15 0518  NA 139 140  K 4.1 4.2  CL 107 104  CO2 22 29  GLUCOSE 93 106*  BUN 14 6  CREATININE 1.00 0.91  CALCIUM 8.5* 8.6*   Liver Function Tests:  Recent Labs Lab  03/05/15 1057 03/08/15 1010  AST 79* 39  ALT 47 35  ALKPHOS 107 105  BILITOT 0.3 1.0  PROT 6.4* 6.4*  ALBUMIN 3.8 3.6    Recent Labs Lab 03/05/15 1057  LIPASE 52*   No results for input(s): AMMONIA in the last 168 hours. CBC:  Recent Labs Lab 03/05/15 1057 03/06/15 0518 03/08/15 1010  WBC 7.5 6.8 7.3  HGB 16.6 15.9 16.5  HCT 47.9 48.0 49.0  MCV 96.6 97.8 98.8  PLT 247 241 223   Cardiac Enzymes: No results for input(s): CKTOTAL, CKMB, CKMBINDEX, TROPONINI in the last 168 hours. BNP (last 3 results) No results for input(s): BNP in the last 8760 hours.  ProBNP (last 3 results) No results for input(s): PROBNP in the last 8760 hours.  CBG: No results for input(s): GLUCAP in the last 168 hours.  No results found for this or any previous visit (from the past 240 hour(s)).   Studies: Mr 3d Recon At Scanner  03/08/2015  CLINICAL DATA:  49 year old male with history of right lower quadrant abdominal pain for the past several years. Nausea. Possible lesion in the head of the pancreas noted on recent CT examination. EXAM: MRI ABDOMEN WITHOUT AND WITH CONTRAST (INCLUDING MRCP) TECHNIQUE: Multiplanar multisequence MR imaging of the abdomen was performed both before and after the administration of intravenous contrast. Heavily T2-weighted images of the biliary and pancreatic ducts were obtained, and three-dimensional MRCP images were rendered by post processing. CONTRAST:  13mL MULTIHANCE GADOBENATE DIMEGLUMINE 529 MG/ML  IV SOLN COMPARISON:  No priors.  CT the abdomen and pelvis 03/05/2015. FINDINGS: Lower chest:  Unremarkable. Hepatobiliary: Mild diffuse loss of signal intensity throughout the hepatic parenchyma on out of phase dual echo images, compatible with mild hepatic steatosis. No suspicious cystic or solid hepatic lesions are noted. MRCP images demonstrate very mild intra and extrahepatic biliary ductal dilatation, with the common bile duct measuring up to 10 mm in the porta  hepatis. The distal common bile duct is remarkable for a tapered focal narrowing shortly before the ampulla. No filling defect is noted within the distal duct to suggest presence of a retained stone. No definite stones within the gallbladder. Fundus of the gallbladder is folded upon itself creating a "pseudolesion" which appears to be within segment 4B of the liver, but is actually part of the gallbladder. Pancreas: In the head of the pancreas lateral to the distal common bile duct between the distal duct and the adjacent second and third portions of the duodenum there is some increased T2 signal intensity, best appreciated on image 29 of series 10. This is rather amorphous, and corresponds with generalized decreased T1 signal intensity and decreased enhancement on post gadolinium images. Increased T2 signal intensity is noted throughout the surrounding soft tissues adjacent to the head of the pancreas and the adjacent duodenum, suggesting inflammatory changes. 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 Notably, MRCP images demonstrate divisum anatomy (type 1). Main pancreatic duct is non dilated. 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 Spleen: Unremarkable. Adrenals/Urinary Tract: Bilateral adrenal glands and bilateral kidneys are normal in appearance. No hydroureteronephrosis in the visualized abdomen. Stomach/Bowel: Visualized portions are unremarkable. Vascular/Lymphatic: No aneurysm identified in the visualized abdominal vasculature. No lymphadenopathy noted in the visualized portions of the abdomen. Other: Inflammatory changes adjacent to the head of the pancreas and the duodenum. No larger volume of ascites. No pneumoperitoneum Musculoskeletal: No aggressive osseous lesions are  noted in the visualized portions of the skeleton. IMPRESSION: 1. 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 The possibility of an inflammatory stricture in this region should be considered given the mild proximal intra and extrahepatic biliary ductal dilatation. No ductal stones are identified. The possibility of an underlying mass is not entirely excluded, and attention on follow-up imaging should be considered after resolution of the patient's acute pancreatitis to exclude underlying neoplasm. Specifically, repeat MRI of the abdomen with and without MRCP should be considered in 3 months to ensure normalization of these findings. Alternatively, this area could be directly visualized with endoscopic ultrasound if ERCP is performed. 2. Type 1 pancreatic divisum anatomy. 3. Mild hepatic steatosis. Electronically Signed   By: Trudie Reed M.D.   On: 03/08/2015 08:28   Mr Roe Coombs W/wo Cm/mrcp  03/08/2015  CLINICAL DATA:  49 year old male with history of right lower quadrant abdominal pain for the past several years. Nausea. Possible lesion in the head of the pancreas noted on recent CT examination. EXAM: MRI ABDOMEN WITHOUT AND WITH CONTRAST (INCLUDING MRCP) TECHNIQUE: Multiplanar multisequence MR imaging of the abdomen was performed both before and after the administration of intravenous contrast. Heavily T2-weighted images of the biliary and pancreatic ducts were obtained, and  three-dimensional MRCP images were rendered by post processing. CONTRAST:  13mL MULTIHANCE GADOBENATE DIMEGLUMINE 529 MG/ML IV SOLN COMPARISON:  No priors.  CT the abdomen and pelvis 03/05/2015. FINDINGS: Lower chest:  Unremarkable. Hepatobiliary: Mild diffuse loss of signal intensity throughout the hepatic parenchyma on out of phase dual echo images, compatible with mild hepatic  steatosis. No suspicious cystic or solid hepatic lesions are noted. MRCP images demonstrate very mild intra and extrahepatic biliary ductal dilatation, with the common bile duct measuring up to 10 mm in the porta hepatis. The distal common bile duct is remarkable for a tapered focal narrowing shortly before the ampulla. No filling defect is noted within the distal duct to suggest presence of a retained stone. No definite stones within the gallbladder. Fundus of the gallbladder is folded upon itself creating a "pseudolesion" which appears to be within segment 4B of the liver, but is actually part of the gallbladder. Pancreas: In the head of the pancreas lateral to the distal common bile duct between the distal duct and the adjacent second and third portions of the duodenum there is some increased T2 signal intensity, best appreciated on image 29 of series 10. This is rather amorphous, and corresponds with generalized decreased T1 signal intensity and decreased enhancement on post gadolinium images. Increased T2 signal intensity is noted throughout the surrounding soft tissues adjacent to the head of the pancreas and the adjacent duodenum, suggesting inflammatory changes. 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 Notably, MRCP images demonstrate divisum anatomy (type 1). Main pancreatic duct is non dilated. 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 Spleen: Unremarkable. Adrenals/Urinary Tract: Bilateral adrenal glands and bilateral kidneys are normal in appearance. No hydroureteronephrosis in the visualized abdomen. Stomach/Bowel: Visualized portions are unremarkable. Vascular/Lymphatic: No aneurysm identified in the visualized abdominal vasculature. No lymphadenopathy noted in the visualized  portions of the abdomen. Other: Inflammatory changes adjacent to the head of the pancreas and the duodenum. No larger volume of ascites. No pneumoperitoneum Musculoskeletal: No aggressive osseous lesions are noted in the visualized portions of the skeleton. IMPRESSION: 1. 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 The possibility of an inflammatory stricture in this region should be considered given the mild proximal intra and extrahepatic biliary ductal dilatation. No ductal stones are identified. The possibility of an underlying mass is not entirely excluded, and attention on follow-up imaging should be considered after resolution of the patient's acute pancreatitis to exclude underlying neoplasm. Specifically, repeat MRI of the abdomen with and without MRCP should be considered in 3 months to ensure normalization of these findings. Alternatively, this area could be directly visualized with endoscopic ultrasound if ERCP is performed. 2. Type 1 pancreatic divisum anatomy. 3. Mild hepatic steatosis. Electronically Signed   By: Trudie Reed M.D.   On: 03/08/2015 08:28    Scheduled Meds: . aspirin  81 mg Oral Daily  . heparin  5,000 Units Subcutaneous 3 times per day  . metoprolol tartrate  25 mg Oral BID  . nicotine  21 mg Transdermal Daily   Continuous Infusions:     Time spent: > 35 minutes    Penny Pia  [facility] Pager [phone] If 7PM-7AM,  please contact night-coverage at [email or web address], password [facility] 03/08/2015, 2:00 PM  LOS: 3 days

## 2015-03-09 ENCOUNTER — Other Ambulatory Visit: Payer: Self-pay

## 2015-03-09 DIAGNOSIS — K869 Disease of pancreas, unspecified: Secondary | ICD-10-CM

## 2015-03-09 DIAGNOSIS — K858 Other acute pancreatitis without necrosis or infection: Secondary | ICD-10-CM

## 2015-03-09 DIAGNOSIS — R194 Change in bowel habit: Secondary | ICD-10-CM

## 2015-03-09 DIAGNOSIS — R1084 Generalized abdominal pain: Secondary | ICD-10-CM | POA: Insufficient documentation

## 2015-03-09 DIAGNOSIS — F1010 Alcohol abuse, uncomplicated: Secondary | ICD-10-CM | POA: Diagnosis present

## 2015-03-09 DIAGNOSIS — R1013 Epigastric pain: Secondary | ICD-10-CM

## 2015-03-09 DIAGNOSIS — K760 Fatty (change of) liver, not elsewhere classified: Secondary | ICD-10-CM | POA: Diagnosis present

## 2015-03-09 DIAGNOSIS — R935 Abnormal findings on diagnostic imaging of other abdominal regions, including retroperitoneum: Secondary | ICD-10-CM

## 2015-03-09 DIAGNOSIS — K859 Acute pancreatitis without necrosis or infection, unspecified: Secondary | ICD-10-CM | POA: Diagnosis present

## 2015-03-09 MED ORDER — ENOXAPARIN SODIUM 40 MG/0.4ML ~~LOC~~ SOLN
40.0000 mg | SUBCUTANEOUS | Status: DC
Start: 2015-03-09 — End: 2015-03-10
  Administered 2015-03-09: 40 mg via SUBCUTANEOUS
  Filled 2015-03-09 (×2): qty 0.4

## 2015-03-09 MED ORDER — FAMOTIDINE 20 MG PO TABS
20.0000 mg | ORAL_TABLET | Freq: Two times a day (BID) | ORAL | Status: DC | PRN
  Administered 2015-03-09: 20 mg via ORAL
  Filled 2015-03-09 (×2): qty 1

## 2015-03-09 NOTE — Progress Notes (Signed)
Progress Note   Subjective  Patient reports feeling improved today. He tolerated salad for lunch and dinner yesterday. He reported some mild abdominal discomfort with eating but well controlled with oral pain medications. He has not had IV pain medication since yesterday morning. No vomiting. He is eating breakfast upon exam this morning.    Objective   Vital signs in last 24 hours: Temp:  [97.9 F (36.6 C)-98.3 F (36.8 C)] 98.2 F (36.8 C) (12/28 0537) Pulse Rate:  [88-97] 93 (12/28 0537) Resp:  [18-20] 18 (12/28 0537) BP: (113-142)/(85-101) 142/101 mmHg (12/28 0537) SpO2:  [97 %-100 %] 100 % (12/28 0537) Last BM Date: 03/05/15 General:    white male in NAD Heart:  Regular rate and rhythm; no murmurs Lungs: Respirations even and unlabored, lungs CTA bilaterally Abdomen:  Soft, nontender and nondistended. Normal bowel sounds. Extremities:  Without edema. Neurologic:  Alert and oriented,  grossly normal neurologically. Psych:  Cooperative. Normal mood and affect.  Intake/Output from previous day: 12/27 0701 - 12/28 0700 In: 480 [P.O.:480] Out: -  Intake/Output this shift:    Lab Results:  Recent Labs  03/08/15 1010  WBC 7.3  HGB 16.5  HCT 49.0  PLT 223   BMET No results for input(s): NA, K, CL, CO2, GLUCOSE, BUN, CREATININE, CALCIUM in the last 72 hours. LFT  Recent Labs  03/08/15 1010  PROT 6.4*  ALBUMIN 3.6  AST 39  ALT 35  ALKPHOS 105  BILITOT 1.0  BILIDIR 0.2  IBILI 0.8   PT/INR No results for input(s): LABPROT, INR in the last 72 hours.  Studies/Results: Mr 3d Recon At Scanner  03/08/2015  CLINICAL DATA:  49 year old male with history of right lower quadrant abdominal pain for the past several years. Nausea. Possible lesion in the head of the pancreas noted on recent CT examination. EXAM: MRI ABDOMEN WITHOUT AND WITH CONTRAST (INCLUDING MRCP) TECHNIQUE: Multiplanar multisequence MR imaging of the abdomen was performed both before and after  the administration of intravenous contrast. Heavily T2-weighted images of the biliary and pancreatic ducts were obtained, and three-dimensional MRCP images were rendered by post processing. CONTRAST:  13mL MULTIHANCE GADOBENATE DIMEGLUMINE 529 MG/ML IV SOLN COMPARISON:  No priors.  CT the abdomen and pelvis 03/05/2015. FINDINGS: Lower chest:  Unremarkable. Hepatobiliary: Mild diffuse loss of signal intensity throughout the hepatic parenchyma on out of phase dual echo images, compatible with mild hepatic steatosis. No suspicious cystic or solid hepatic lesions are noted. MRCP images demonstrate very mild intra and extrahepatic biliary ductal dilatation, with the common bile duct measuring up to 10 mm in the porta hepatis. The distal common bile duct is remarkable for a tapered focal narrowing shortly before the ampulla. No filling defect is noted within the distal duct to suggest presence of a retained stone. No definite stones within the gallbladder. Fundus of the gallbladder is folded upon itself creating a "pseudolesion" which appears to be within segment 4B of the liver, but is actually part of the gallbladder. Pancreas: In the head of the pancreas lateral to the distal common bile duct between the distal duct and the adjacent second and third portions of the duodenum there is some increased T2 signal intensity, best appreciated on image 29 of series 10. This is rather amorphous, and corresponds with generalized decreased T1 signal intensity and decreased enhancement on post gadolinium images. Increased T2 signal intensity is noted throughout the surrounding soft tissues adjacent to the head of the pancreas and the adjacent  duodenum, suggesting inflammatory changes. 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 Notably, MRCP images demonstrate divisum anatomy (type 1). Main pancreatic duct is non dilated. 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 Spleen: Unremarkable. Adrenals/Urinary Tract: Bilateral adrenal glands and bilateral kidneys are normal in appearance. No hydroureteronephrosis in the visualized abdomen. Stomach/Bowel: Visualized portions are unremarkable. Vascular/Lymphatic: No aneurysm identified in the visualized abdominal vasculature. No lymphadenopathy noted in the visualized portions of the abdomen. Other: Inflammatory changes adjacent to the head of the pancreas and the duodenum. No larger volume of ascites. No pneumoperitoneum Musculoskeletal: No aggressive osseous lesions are noted in the visualized portions of the skeleton. IMPRESSION: 1. 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 The possibility of an inflammatory stricture in this region should be considered given the mild proximal intra and extrahepatic biliary ductal dilatation. No ductal stones are identified. The possibility of an underlying mass is not entirely excluded, and attention on follow-up imaging should be considered after resolution of the patient's acute pancreatitis to exclude underlying neoplasm. Specifically, repeat MRI of the abdomen with and without MRCP should be considered in 3 months to ensure normalization of these findings. Alternatively, this area could be directly visualized with endoscopic ultrasound if ERCP is performed. 2. Type 1 pancreatic divisum anatomy. 3. Mild hepatic steatosis. Electronically Signed   By: Trudie Reed M.D.   On: 03/08/2015 08:28   Mr Douglas [name] W/wo Cm/mrcp  03/08/2015  CLINICAL DATA:  49 year old male with history of right lower quadrant abdominal pain for the past several years. Nausea. Possible lesion in the  head of the pancreas noted on recent CT examination. EXAM: MRI ABDOMEN WITHOUT AND WITH CONTRAST (INCLUDING MRCP) TECHNIQUE: Multiplanar multisequence MR imaging of the abdomen was performed both before and after the administration of intravenous contrast. Heavily T2-weighted images of the biliary and pancreatic ducts were obtained, and three-dimensional MRCP images were rendered by post processing. CONTRAST:  13mL MULTIHANCE GADOBENATE DIMEGLUMINE 529 MG/ML IV SOLN COMPARISON:  No priors.  CT the abdomen and pelvis 03/05/2015. FINDINGS: Lower chest:  Unremarkable. Hepatobiliary: Mild diffuse loss of signal intensity throughout the hepatic parenchyma on out of phase dual echo images, compatible with mild hepatic steatosis. No suspicious cystic or solid hepatic lesions are noted. MRCP images demonstrate very mild intra and extrahepatic biliary ductal dilatation, with the common bile duct measuring up to 10 mm in the porta hepatis. The distal common bile duct is remarkable for a tapered focal narrowing shortly before the ampulla. No filling defect is noted within the distal duct to suggest presence of a retained stone. No definite stones within the gallbladder. Fundus of the gallbladder is folded upon itself creating a "pseudolesion" which appears to be within segment 4B of the liver, but is actually part of the gallbladder. Pancreas: In the head of the pancreas lateral to the distal common bile duct between the distal duct and the adjacent second and third portions of the duodenum there is some increased T2 signal intensity, best appreciated on image 29 of series 10. This is rather amorphous, and corresponds with generalized decreased T1 signal intensity and decreased enhancement on post gadolinium images. Increased T2 signal intensity is  noted throughout the surrounding soft tissues adjacent to the head of the pancreas and the adjacent duodenum, suggesting inflammatory changes. 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 Notably, MRCP images demonstrate divisum anatomy (type 1). Main pancreatic duct is non dilated. 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 Spleen: Unremarkable. Adrenals/Urinary Tract: Bilateral adrenal glands and bilateral kidneys are normal in appearance. No hydroureteronephrosis in the visualized abdomen. Stomach/Bowel: Visualized portions are unremarkable. Vascular/Lymphatic: No aneurysm identified in the visualized abdominal vasculature. No lymphadenopathy noted in the visualized portions of the abdomen. Other: Inflammatory changes adjacent to the head of the pancreas and the duodenum. No larger volume of ascites. No pneumoperitoneum Musculoskeletal: No aggressive osseous lesions are noted in the visualized portions of the skeleton. IMPRESSION: 1. 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 The possibility of an inflammatory stricture in this region should be considered given the mild proximal intra and extrahepatic biliary ductal dilatation. No ductal stones are identified. The possibility of an underlying mass is not entirely excluded, and attention on follow-up imaging should be considered after resolution of the patient's acute pancreatitis to exclude underlying neoplasm. Specifically, repeat MRI of the abdomen with and without MRCP should be considered in 3 months to ensure normalization of these findings. Alternatively, this area could be directly visualized with endoscopic ultrasound if ERCP is performed. 2. Type 1 pancreatic divisum anatomy. 3. Mild hepatic steatosis. Electronically Signed   By: Trudie Reed M.D.   On: 03/08/2015 08:28       Assessment / Plan:   49 y/o male with a history of alcohol and tobacco abuse admitted with abdominal pain. CT scan showed changes concerning for possible focal pancreatitis vs. Pancreatic mass lesion. MRI / MRCP obtained with report as above. Radiology is favoring "groove pancreatitis", without clear mass lesion, although given the inflammation a mass cannot be definitively ruled out, but it seems less likely at this time. He has some mass affect on the CBD with some narrowing however LFTs are normal without evidence of obstruction. No evidence of GB stones or retained stones in the GB.   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 He does appear to be improving and is now tolerating a low fat diet on oral pain medications.  At this point, if he continues to tolerate a low fat diet this morning and pain controlled with oral pain medications, I think he can be discharged later today.   Moving forward, I recommend an EUS for him once the acute inflammation has resolved to ensure no evidence of a mass lesion, and will discuss this with our EUS endoscopist, and this can be done as an outpatient.   Otherwise I have discussed the importance of tobacco and alcohol cessation with him at length, and he understands and seems motivated to quit both. He has fatty liver noted on imaging but no evidence of cirrhosis. He should be on a CIWA protocol until he has gotten through detox while admitted. He otherwise endorses some changes in his bowels and "floating stools", we will check stool pancreatic elastase to rule out exocrine deficiency. With his  scant blood in the stools noted occasionally and bowel habit changes, he also warrants a colonoscopy but can do this as an outpatient. We will schedule an outpatient follow up with him for this.    Douglas PatrickSteven Rylin Saez, MD [facility] Pager  [phone]    Active Problems:  Abdominal pain  pancreatitis  alcohol abuse  tobacco abuse  nonspecific abdominal imaging abnormalities  bowel habit changes  fatty liver       LOS: 4 days   Douglas [name]  03/09/2015, 7:57 AM

## 2015-03-09 NOTE — Progress Notes (Signed)
Progress Note   [name] NWG:[id number] DOB: 1966/02/23 DOA: 03/05/2015 PCP: No PCP Per Patient   Brief Narrative:   [name] is an 49 y.o. male the PMH of hypertension who was admitted 03/05/15 with chief complaint of abdominal pain. Imaging studies done on admission showed focal pancreatitis versus duodenitis.  Assessment/Plan:   Principal Problem:   Pancreatitis versus pancreatic mass, acute with abdominal pain - CT done on admission showed possible focal pancreatitis versus pancreatic mass lesion. 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 - Counseled on alcohol avoidance. - Given recurrent symptoms with advancement of diet, will downgrade diet back to clear liquids. - Will need EUS as an outpatient when acute inflammation resolved.  Active Problems:   Tobacco abuse - Counseled.    Alcohol abuse/fatty liver - Counseled. Continue alcohol detox per CIWA protocol.    DVT Prophylaxis - Lovenox ordered.   Family Communication/Anticipated D/C date and plan/Code Status   Family Communication: No family at the bedside. Disposition Plan: Home when pain improved and diet advanced.  Lives with a roommate. Anticipated D/C date:   2-3 days. Code Status: Full code.   IV Access:    Peripheral IV   Procedures and diagnostic studies:   Ct Abdomen Pelvis W Contrast  03/05/2015  CLINICAL DATA:  History of right lower quadrant abdominal pain. EXAM: CT ABDOMEN AND PELVIS WITH CONTRAST TECHNIQUE: Multidetector CT imaging of the abdomen and pelvis was performed using the standard protocol following bolus administration of intravenous contrast. CONTRAST:  100mL OMNIPAQUE IOHEXOL 300 MG/ML SOLN, 25mL OMNIPAQUE IOHEXOL 300 MG/ML SOLN COMPARISON:  None. FINDINGS: Lower chest:  No acute findings. Hepatobiliary: No masses or other significant abnormality. Pancreas: There is an ill-defined area of  hypoattenuation within the head of the pancreas extending to the ampulla. No evidence of biliary or pancreatic ductal dilation. Mild adjacent fat stranding and tiny retroperitoneal lymph nodes are seen. The body and tail of the pancreas have normal appearance. The adjacent vascularity has normal appearance. Spleen: Within normal limits in size and appearance. Adrenals/Urinary Tract: No masses identified. No evidence of hydronephrosis. Stomach/Bowel: No evidence of obstruction, inflammatory process, or abnormal fluid collections. The appendix is retrocecal and normal in appearance. Vascular/Lymphatic: No pathologically enlarged lymph nodes. No evidence of abdominal aortic aneurysm. Reproductive: No mass or other significant abnormality. Other: None. Musculoskeletal:  No suspicious bone lesions identified. IMPRESSION: Ill-defined area of hypoattenuation associated with the head of the pancreas. Differential diagnosis includes pancreatic malignancy, acute focal pancreatitis or duodenitis. Further evaluation with MRCP or endoscopic evaluation should be considered. Electronically Signed   By: Ted Mcalpineobrinka  Dimitrova M.D.   On: 03/05/2015 13:12     Medical Consultants:    Gastroenterology  Anti-Infectives:   Anti-infectives    None      Subjective:   Minerva EndsJonathan Hubbard vomited up his omelet today, and has sharp abdominal pain, currently level 8/10 on the right lower abdomen.  Objective:    Filed Vitals:   03/08/15 1351 03/08/15 2100 03/09/15 0537 03/09/15 1411  BP: 138/98 113/85 142/101 116/78  Pulse: 97 88 93 85  Temp: 98.3 F (36.8 C) 97.9 F (36.6 C) 98.2 F (36.8 C) 98.4 F (36.9 C)  TempSrc: Axillary Oral Oral Oral  Resp: 20 18 18 16   Height:      Weight:      SpO2: 98% 97% 100% 97%   No intake or output data in the 24  hours ending 03/09/15 1543 Filed Weights   03/05/15 1308  Weight: 68.629 kg (151 lb 4.8 oz)    Exam: Gen:  NAD Cardiovascular:  RRR, No M/R/G Respiratory:   Lungs CTAB Gastrointestinal:  Abdomen tender on the right side, + BS Extremities:  No C/E/C   Data Reviewed:    Labs: Basic Metabolic Panel:  Recent Labs Lab 03/05/15 1057 03/06/15 0518  NA 139 140  K 4.1 4.2  CL 107 104  CO2 22 29  GLUCOSE 93 106*  BUN 14 6  CREATININE 1.00 0.91  CALCIUM 8.5* 8.6*   GFR Estimated Creatinine Clearance: 96.3 mL/min (by C-G formula based on Cr of 0.91). Liver Function Tests:  Recent Labs Lab 03/05/15 1057 03/08/15 1010  AST 79* 39  ALT 47 35  ALKPHOS 107 105  BILITOT 0.3 1.0  PROT 6.4* 6.4*  ALBUMIN 3.8 3.6    Recent Labs Lab 03/05/15 1057  LIPASE 52*   CBC:  Recent Labs Lab 03/05/15 1057 03/06/15 0518 03/08/15 1010  WBC 7.5 6.8 7.3  HGB 16.6 15.9 16.5  HCT 47.9 48.0 49.0  MCV 96.6 97.8 98.8  PLT 247 241 223   Microbiology No results found for this or any previous visit (from the past 240 hour(s)).   Medications:   . aspirin  81 mg Oral Daily  . enoxaparin (LOVENOX) injection  40 mg Subcutaneous Q24H  . metoprolol tartrate  25 mg Oral BID  . nicotine  21 mg Transdermal Daily   Continuous Infusions:   Time spent: 25 minutes.   LOS: 4 days   RAMA,CHRISTINA  [facility] Pager [phone]. If unable to reach me by pager, please call my cell phone at [phone].  *Please refer to [email or web address], password [email or web address] to get updated schedule on who will round on this patient, as hospitalists switch teams weekly. If 7PM-7AM, please contact night-coverage at [email or web address], password [email or web address] for any overnight needs.  03/09/2015, 3:43 PM

## 2015-03-10 DIAGNOSIS — F1010 Alcohol abuse, uncomplicated: Secondary | ICD-10-CM

## 2015-03-10 DIAGNOSIS — Z720 Tobacco use: Secondary | ICD-10-CM

## 2015-03-10 MED ORDER — HYDROCODONE-ACETAMINOPHEN 5-325 MG PO TABS: 1.0000 | ORAL_TABLET | ORAL | Status: DC | PRN

## 2015-03-10 MED ORDER — INFLUENZA VAC SPLIT QUAD 0.5 ML IM SUSY: 0.5000 mL | PREFILLED_SYRINGE | INTRAMUSCULAR | Status: DC

## 2015-03-10 NOTE — Progress Notes (Signed)
    03/10/2015    Re: Douglas EndsJonathan [name]    The above named individual was in the hospital from 03/05/2015-03/10/2015 under my care.  He is medically clear to return to work.     Dr. Trula Orehristina Helio Lack  [facility]

## 2015-03-10 NOTE — Progress Notes (Signed)
Patient ID: Douglas [name], male   DOB: 1965-07-04, 49 y.o.   MRN: [id number]    Progress Note   Subjective  Feeling better- hungry, wants to eat and go home- thinks egg yesterday just didn't agree with him. Says Vicodin is working for pain control   Objective   Vital signs in last 24 hours: Temp:  [97.5 F (36.4 C)-98.4 F (36.9 C)] 97.5 F (36.4 C) (12/29 0542) Pulse Rate:  [81-91] 91 (12/29 0542) Resp:  [16-17] 17 (12/29 0542) BP: (107-116)/(77-82) 115/82 mmHg (12/29 0542) SpO2:  [97 %-100 %] 100 % (12/29 0542) Last BM Date: 03/09/15 General:    White male in NAD Heart:  Regular rate and rhythm; no murmurs Lungs: Respirations even and unlabored, lungs CTA bilaterally Abdomen:  Soft, nontender and nondistended. Normal bowel sounds. Extremities:  Without edema. Neurologic:  Alert and oriented,  grossly normal neurologically. Psych:  Cooperative. Normal mood and affect.  Intake/Output from previous day: 12/28 0701 - 12/29 0700 In: 480 [P.O.:480] Out: -  Intake/Output this shift: Total I/O In: 240 [P.O.:240] Out: -   Lab Results:  Recent Labs  03/08/15 1010  WBC 7.3  HGB 16.5  HCT 49.0  PLT 223   BMET No results for input(s): NA, K, CL, CO2, GLUCOSE, BUN, CREATININE, CALCIUM in the last 72 hours. LFT  Recent Labs  03/08/15 1010  PROT 6.4*  ALBUMIN 3.6  AST 39  ALT 35  ALKPHOS 105  BILITOT 1.0  BILIDIR 0.2  IBILI 0.8   PT/INR No results for input(s): LABPROT, INR in the last 72 hours.  Studies/Results: No results found.     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 Diet was changed back to clear yesterday after increased pain post breakfast He is hungry , and feels he can manage  Pain with Vicodin Will increase to 40 gm fat diet, and if tolerates can be discharged He needs complete abstinence for ETOH, and continued low fat diet He will need office follow up -our office will arrange- as will need  EUS to r/o underlying mass, and will need Colonoscopy eventually because of loose stools  Will follow up on fecal elastase- add pancreatic enzyme supplement  if abnormal     Principal Problem:   Pancreatitis, acute Active Problems:   Tobacco abuse   Abdominal pain   Pancreatic mass   Alcohol abuse   Fatty liver   Pancreatitis   Generalized abdominal pain     LOS: 5 days   Douglas [name]  03/10/2015, 10:12 AM

## 2015-03-10 NOTE — Progress Notes (Signed)
Nutrition Follow-up  DOCUMENTATION CODES:   Not applicable  INTERVENTION:  - Continue CLD and advance diet as medically feasible - RD will continue to monitor for needs  NUTRITION DIAGNOSIS:   Inadequate oral intake related to poor appetite, nausea, vomiting as evidenced by per patient/family report. -ongoing  GOAL:   Patient will meet greater than or equal to 90% of their needs -unmet for pt on CLD  MONITOR:   PO intake, Diet advancement, Weight trends, Labs, I & O's  ASSESSMENT:   Douglas [name] is a 49 yo male With history of hypertension who presented to the hospital with complaints of abdominal discomfort. The pain has been present for the last month. But he reports that within the last week the pain has been persistent. Since onset has been gradually getting worse. He states that the pain was enough for him to come to the hospital for further evaluation. Nothing he is aware of makes it better or worse. He does report chills at night but denies any fevers. He denies any sick contacts. He feels like he is losing weight without trying but is not aware of how much weight  12/29 Diet changes as follows:  12/24 @ 1444: NPO 12/24 @ 1751: CLD 12/27 @ 0923: Heart Healthy 12/28 @ 1256: CLD  Chart review indicates pt ate 100% of breakfast yesterday (12/28) with no other intakes documented. Visualized CLD tray on bedside table with ~50% completion. Pt reports good tolerance of this this AM with no associated issues. He states that yesterday he had an egg white omelet for breakfast and experienced abdominal pain and nausea so diet was changed back to CLD.   Pt expresses frustration with ongoing admission and wanting to go home. He has a good understanding of need to limit fat in his diet as well as drinking adequate fluids and avoiding alcohol. Pt denies any questions or concerns regarding nutrition at this time. Unable to meet needs on CLD. Medications reviewed. Labs reviewed; Ca: 8.6  mg/dL.   12/26 - Spoke with pt at bedside.  - Pt reports poor po for approximately 2-[redacted] wks along with constant nausea, some vomiting.  - Admits to vomiting bile in the mild of the night 2-3x/wk.  - Encouraged pt to try chamomile or ginger tea to help alleviate this.  - Pt admitted to having a familial hx of alcoholism and that he "likes to drink" and likes beer.  - Attending physician told him he would have to give up beer and fried foods. - Encouraged pt to avoid fried foods, and eat smaller/more frequent meals. - He also reports a hx of acid-reflux which he alleviated with generic brand Prilosec-OTC  - Nutrition-Focused physical exam completed. Findings are no fat depletion, no muscle depletion, and no edema.    Diet Order:  Diet clear liquid Room service appropriate?: Yes; Fluid consistency:: Thin  Skin:  Reviewed, no issues  Last BM:  12/28  Height:   Ht Readings from Last 1 Encounters:  03/05/15 5\' 11"  (1.803 m)    Weight:   Wt Readings from Last 1 Encounters:  03/05/15 151 lb 4.8 oz (68.629 kg)    Ideal Body Weight:  78.18 kg  BMI:  Body mass index is 21.11 kg/(m^2).  Estimated Nutritional Needs:   Kcal:  1700-2050 calories  Protein:  60-70 grams  Fluid:  >/= 1.7L  EDUCATION NEEDS:   No education needs identified at this time     Trenton GammonJessica Aleecia Tapia, RD, LDN Inpatient Clinical Dietitian  Pager # [phone] After hours/weekend pager # [phone]  

## 2015-03-10 NOTE — Discharge Instructions (Signed)

## 2015-03-10 NOTE — Progress Notes (Signed)
Douglas EndsJonathan [name] to be D/C'd Home per MD order.  Discussed prescriptions and follow up appointments with the patient. Prescriptions given to patient, medication list explained in detail. Pt verbalized understanding.    Medication List    STOP taking these medications        ibuprofen 200 MG tablet  Commonly known as:  ADVIL,MOTRIN     ibuprofen 800 MG tablet  Commonly known as:  ADVIL,MOTRIN     penicillin v potassium 500 MG tablet  Commonly known as:  VEETID     ticagrelor 90 MG Tabs tablet  Commonly known as:  BRILINTA      TAKE these medications        aspirin 81 MG chewable tablet  Chew 1 tablet (81 mg total) by mouth daily.     HYDROcodone-acetaminophen 5-325 MG tablet  Commonly known as:  NORCO/VICODIN  Take 1-2 tablets by mouth every 4 (four) hours as needed for moderate pain.     metoprolol tartrate 25 MG tablet  Commonly known as:  LOPRESSOR  Take 1 tablet (25 mg total) by mouth 2 (two) times daily.     nitroGLYCERIN 0.4 MG SL tablet  Commonly known as:  NITROSTAT  Place 1 tablet (0.4 mg total) under the tongue every 5 (five) minutes x 3 doses as needed for chest pain.     rosuvastatin 10 MG tablet  Commonly known as:  CRESTOR  Take 1 tablet (10 mg total) by mouth daily.        Filed Vitals:   03/09/15 2128 03/10/15 0542  BP: 107/77 115/82  Pulse: 81 91  Temp: 97.9 F (36.6 C) 97.5 F (36.4 C)  Resp: 17 17    Skin clean, dry and intact without evidence of skin break down, no evidence of skin tears noted. IV catheter discontinued intact. Site without signs and symptoms of complications. Dressing and pressure applied. Pt denies pain at this time. No complaints noted.  An After Visit Summary was printed and given to the patient. Patient escorted via WC, and D/C home via private auto.  [name], [name] 03/10/2015 2:37 PM

## 2015-03-10 NOTE — Discharge Summary (Cosign Needed)
Physician Discharge Summary  Douglas [name] ZOX:[id number] DOB: 1966-01-28 DOA: 03/05/2015  PCP: No PCP Per Patient  Admit date: 03/05/2015 Discharge date: 03/10/2015   Recommendations for Outpatient Follow-Up:   1. F/U with Dr. Adela Lank, office will call with appointment time.   Discharge Diagnosis:   Principal Problem:    Pancreatitis, acute Active Problems:    Tobacco abuse    Abdominal pain    Pancreatic mass    Alcohol abuse    Fatty liver    Pancreatitis    Generalized abdominal pain   Discharge disposition:  Home.   Discharge Condition: Improved.  Diet recommendation: Low sodium, heart healthy.    History of Present Illness:   Douglas [name] is an 49 y.o. male the PMH of hypertension who was admitted 03/05/15 with chief complaint of abdominal pain. Imaging studies done on admission showed focal pancreatitis versus duodenitis.  Hospital Course by Problem:   Principal Problem:  Pancreatitis versus pancreatic mass, acute with abdominal pain - CT done on admission showed possible focal pancreatitis versus pancreatic mass lesion. 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 - Counseled on alcohol avoidance. - Will need EUS as an outpatient when acute inflammation resolved. - Diet advanced successfully prior to D/C.  Active Problems:  Tobacco abuse - Counseled.   Alcohol abuse/fatty liver - Counseled. Detoxed per CIWA protocol.    Medical Consultants:    Gastroenterology: Napoleon Form, MD   Discharge Exam:   Filed Vitals:   03/09/15 2128 03/10/15 0542  BP: 107/77 115/82  Pulse: 81 91  Temp: 97.9 F (36.6 C) 97.5 F (36.4 C)  Resp: 17 17   Filed Vitals:   03/09/15 0537 03/09/15 1411 03/09/15 2128 03/10/15 0542  BP: 142/101 116/78 107/77 115/82  Pulse: 93 85 81 91  Temp: 98.2 F (36.8 C) 98.4 F (36.9 C) 97.9 F (36.6 C) 97.5 F (36.4 C)    TempSrc: Oral Oral Oral Oral  Resp: Height:      Weight:      SpO2: 100% 97% 98% 100%    Gen:  NAD Cardiovascular:  RRR, No M/R/G Respiratory: Lungs CTAB Gastrointestinal: Abdomen soft, NT/ND with normal active bowel sounds. Extremities: No C/E/C   The results of significant diagnostics from this hospitalization (including imaging, microbiology, ancillary and laboratory) are listed below for reference.     Procedures and Diagnostic Studies:   Ct Abdomen Pelvis W Contrast  03/05/2015  CLINICAL DATA:  History of right lower quadrant abdominal pain. EXAM: CT ABDOMEN AND PELVIS WITH CONTRAST TECHNIQUE: Multidetector CT imaging of the abdomen and pelvis was performed using the standard protocol following bolus administration of intravenous contrast. CONTRAST:  OMNIPAQUE IOHEXOL 300 MG/ML SOLN, 25mL OMNIPAQUE IOHEXOL 300 MG/ML SOLN COMPARISON:  None. FINDINGS: Lower chest:  No acute findings. Hepatobiliary: No masses or other significant abnormality. Pancreas: There is an ill-defined area of hypoattenuation within the head of the pancreas extending to the ampulla. No evidence of biliary or pancreatic ductal dilation. Mild adjacent fat stranding and tiny retroperitoneal lymph nodes are seen. The body and tail of the pancreas have normal appearance. The adjacent vascularity has normal appearance. Spleen: Within normal limits in size and appearance. Adrenals/Urinary Tract: No masses identified. No evidence of hydronephrosis. Stomach/Bowel: No evidence of obstruction, inflammatory process, or abnormal fluid collections. The appendix is retrocecal and normal in appearance. Vascular/Lymphatic: No pathologically enlarged lymph nodes. No evidence of  abdominal aortic aneurysm. Reproductive: No mass or other significant abnormality. Other: None. Musculoskeletal:  No suspicious bone lesions identified. IMPRESSION: Ill-defined area of hypoattenuation associated with the head of the pancreas.  Differential diagnosis includes pancreatic malignancy, acute focal pancreatitis or duodenitis. Further evaluation with MRCP or endoscopic evaluation should be considered. Electronically Signed   By: Ted Mcalpineobrinka  Dimitrova M.D.   On: 03/05/2015 13:12     Labs:   Basic Metabolic Panel:  Recent Labs Lab 03/05/15 1057 03/06/15 0518  NA 139 140  K 4.1 4.2  CL 107 104  CO2 22 29  GLUCOSE 93 106*  BUN 14 6  CREATININE 1.00 0.91  CALCIUM 8.5* 8.6*   GFR Estimated Creatinine Clearance: 96.3 mL/min (by C-G formula based on Cr of 0.91). Liver Function Tests:  Recent Labs Lab 03/05/15 1057 03/08/15 1010  AST 79* 39  ALT 47 35  ALKPHOS 107 105  BILITOT 0.3 1.0  PROT 6.4* 6.4*  ALBUMIN 3.8 3.6    Recent Labs Lab 03/05/15 1057  LIPASE 52*   CBC:  Recent Labs Lab 03/05/15 1057 03/06/15 0518 03/08/15 1010  WBC 7.5 6.8 7.3  HGB 16.6 15.9 16.5  HCT 47.9 48.0 49.0  MCV 96.6 97.8 98.8  PLT 247 241 223     Discharge Instructions:   Discharge Instructions    Call MD for:  persistant nausea and vomiting    Complete by:  As directed      Call MD for:  severe uncontrolled pain    Complete by:  As directed      Call MD for:  temperature >100.4    Complete by:  As directed      Diet - low sodium heart healthy    Complete by:  As directed      Increase activity slowly    Complete by:  As directed             Medication List    STOP taking these medications        ibuprofen 200 MG tablet  Commonly known as:  ADVIL,MOTRIN     ibuprofen 800 MG tablet  Commonly known as:  ADVIL,MOTRIN     penicillin v potassium 500 MG tablet  Commonly known as:  VEETID     ticagrelor 90 MG Tabs tablet  Commonly known as:  BRILINTA      TAKE these medications        aspirin 81 MG chewable tablet  Chew 1 tablet (81 mg total) by mouth daily.     HYDROcodone-acetaminophen 5-325 MG tablet  Commonly known as:  NORCO/VICODIN  Take 1-2 tablets by mouth every 4 (four) hours as  needed for moderate pain.     metoprolol tartrate 25 MG tablet  Commonly known as:  LOPRESSOR  Take 1 tablet (25 mg total) by mouth 2 (two) times daily.     nitroGLYCERIN 0.4 MG SL tablet  Commonly known as:  NITROSTAT  Place 1 tablet (0.4 mg total) under the tongue every 5 (five) minutes x 3 doses as needed for chest pain.     rosuvastatin 10 MG tablet  Commonly known as:  CRESTOR  Take 1 tablet (10 mg total) by mouth daily.           Follow-up Information    Go to [facility].   Specialty:  Gastroenterology   Why:  Office will call with an appt time.   Contact information:   [address]  [place] [address] [phone]        Time coordinating discharge: 35 minutes.  Signed:  RAMA,CHRISTINA  Pager [phone] [facility] 03/10/2015, 2:06 PM

## 2015-03-11 LAB — PANCREATIC ELASTASE, FECAL: Pancreatic Elastase-1, Stool: >500 ug Elast./g (ref >200)

## 2015-03-15 ENCOUNTER — Telehealth: Payer: Self-pay

## 2015-03-15 NOTE — Telephone Encounter (Signed)
-----   Message from Napoleon FormKavitha Nandigam V, MD sent at 03/09/2015 11:10 AM EST ----- Abram SanderHi Steve,  I can follow up. Marisue IvanLiz, if I have no appt ok to follow with APP and please schedule him for colonoscopy Thanks VN  ----- Message -----    From: Ruffin FrederickSteven Paul Armbruster, MD    Sent: 03/09/2015   8:08 AM      To: Daphine Deutscheregina N Miller, RN, Napoleon FormKavitha Nandigam V, MD  Hi Carney BernVeena / Regina,  This patient is likely going to be discharged today or tomorrow. His MRCP shows likely focal / groove pancreatitis which is getting better. He will need follow up in the clinic in a couple weeks with one of us. Veena I think you saw him first if you want to see him, otherwise I am happy to, and Amy knows him as well. He will need an outpatient EUS in 1-2 months or so, I have spoken with Jesusita OkaDan about it, we can coordinate at his follow up appointment. He also needs a colonoscopy for some chronic bowel changes and rectal bleeding.   Brett CanalesSteve

## 2015-03-15 NOTE — Telephone Encounter (Signed)
Patient is scheduled for 03/28/15 at 2:00 pm. This information was included on his discharge instructions.

## 2015-03-15 NOTE — Telephone Encounter (Signed)
Patient is aware of the follow up appointment.

## 2015-03-15 NOTE — Telephone Encounter (Signed)
-----   Message from Napoleon FormKavitha Nandigam V, MD sent at 03/08/2015 11:00 AM EST ----- Can you please schedule him for repeat MRCP in 3 months and f/u in OV after that Thanks VN

## 2015-03-17 ENCOUNTER — Telehealth: Payer: Self-pay | Admitting: Gastroenterology

## 2015-03-17 NOTE — Telephone Encounter (Signed)
Informed pt that we would not prescribe him hydrocodone. He has an appt with Amy on 03/28/2015 and he may be reevaluated then. I advised him if the pain is too much to bear to go to the Emergency Room.

## 2015-03-18 NOTE — Discharge Summary (Signed)
Physician Discharge Summary  Douglas [name] ZOX:[id number] DOB: [birth year] DOA: 03/05/2015  PCP: No PCP Per Patient  Admit date: 03/05/2015 Discharge date: 03/10/2015   Recommendations for Outpatient Follow-Up:   1. Recommend close outpatient F/U with GI.   Discharge Diagnosis:   Principal Problem:    Pancreatitis, acute Active Problems:    Tobacco abuse    Abdominal pain    Pancreatic mass    Alcohol abuse    Fatty liver    Pancreatitis    Generalized abdominal pain   Discharge disposition:  Home.   Discharge Condition: Improved.  Diet recommendation: Low sodium, heart healthy.    History of Present Illness:   Douglas [name] is an 50 y.o. male the PMH of hypertension who was admitted 03/05/15 with chief complaint of abdominal pain. Imaging studies done on admission showed focal pancreatitis versus duodenitis.  Hospital Course by Problem:   Principal Problem:  Pancreatitis versus pancreatic mass, acute with abdominal pain - CT done on admission showed possible focal pancreatitis versus pancreatic mass lesion. 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 - Counseled on alcohol avoidance. - Diet advanced prior to D/C. - Will need EUS as an outpatient when acute inflammation resolved.  Active Problems:  Tobacco abuse - Counseled.   Alcohol abuse/fatty liver - Counseled. Detoxed with Ativan per [facility] protocol.    Medical Consultants:    None.   Discharge Exam:   Filed Vitals:   03/09/15 2128 03/10/15 0542  BP: 107/77 115/82  Pulse: 81 91  Temp: 97.9 F (36.6 C) 97.5 F (36.4 C)  Resp: 17 17   Filed Vitals:   03/09/15 0537 03/09/15 1411 03/09/15 2128 03/10/15 0542  BP: 142/101 116/78 107/77 115/82  Pulse: 93 85 81 91  Temp: 98.2 F (36.8 C) 98.4 F (36.9 C) 97.9 F (36.6 C) 97.5 F (36.4 C)  TempSrc: Oral Oral Oral Oral  Resp: 18 16 17 17   Height:       Weight:      SpO2: 100% 97% 98% 100%    Gen:  NAD Cardiovascular:  RRR, No M/R/G Respiratory: Lungs CTAB Gastrointestinal: Abdomen soft, NT/ND with normal active bowel sounds. Extremities: No C/E/C   The results of significant diagnostics from this hospitalization (including imaging, microbiology, ancillary and laboratory) are listed below for reference.     Procedures and Diagnostic Studies:   Ct Abdomen Pelvis W Contrast  03/05/2015  CLINICAL DATA:  History of right lower quadrant abdominal pain. EXAM: CT ABDOMEN AND PELVIS WITH CONTRAST TECHNIQUE: Multidetector CT imaging of the abdomen and pelvis was performed using the standard protocol following bolus administration of intravenous contrast. CONTRAST:  OMNIPAQUE IOHEXOL 300 MG/ML SOLN, 25mL OMNIPAQUE IOHEXOL 300 MG/ML SOLN COMPARISON:  None. FINDINGS: Lower chest:  No acute findings. Hepatobiliary: No masses or other significant abnormality. Pancreas: There is an ill-defined area of hypoattenuation within the head of the pancreas extending to the ampulla. No evidence of biliary or pancreatic ductal dilation. Mild adjacent fat stranding and tiny retroperitoneal lymph nodes are seen. The body and tail of the pancreas have normal appearance. The adjacent vascularity has normal appearance. Spleen: Within normal limits in size and appearance. Adrenals/Urinary Tract: No masses identified. No evidence of hydronephrosis. Stomach/Bowel: No evidence of obstruction, inflammatory process, or abnormal fluid collections. The appendix is retrocecal and normal in appearance. Vascular/Lymphatic: No pathologically enlarged lymph nodes. No evidence of abdominal aortic aneurysm. Reproductive: No mass or other  significant abnormality. Other: None. Musculoskeletal:  No suspicious bone lesions identified. IMPRESSION: Ill-defined area of hypoattenuation associated with the head of the pancreas. Differential diagnosis includes pancreatic malignancy, acute  focal pancreatitis or duodenitis. Further evaluation with MRCP or endoscopic evaluation should be considered. Electronically Signed   By: Ted Mcalpineobrinka  Dimitrova M.D.   On: 03/05/2015 13:12     Labs:   Basic Metabolic Panel: No results for input(s): NA, K, CL, CO2, GLUCOSE, BUN, CREATININE, CALCIUM, MG, PHOS in the last 168 hours. GFR Estimated Creatinine Clearance: 96.3 mL/min (by C-G formula based on Cr of 0.91). Liver Function Tests: No results for input(s): AST, ALT, ALKPHOS, BILITOT, PROT, ALBUMIN in the last 168 hours. No results for input(s): LIPASE, AMYLASE in the last 168 hours. No results for input(s): AMMONIA in the last 168 hours. Coagulation profile No results for input(s): INR, PROTIME in the last 168 hours.  CBC: No results for input(s): WBC, NEUTROABS, HGB, HCT, MCV, PLT in the last 168 hours. Cardiac Enzymes: No results for input(s): CKTOTAL, CKMB, CKMBINDEX, TROPONINI in the last 168 hours. BNP: Invalid input(s): POCBNP CBG: No results for input(s): GLUCAP in the last 168 hours. D-Dimer No results for input(s): DDIMER in the last 72 hours. Hgb A1c No results for input(s): HGBA1C in the last 72 hours. Lipid Profile No results for input(s): CHOL, HDL, LDLCALC, TRIG, CHOLHDL, LDLDIRECT in the last 72 hours. Thyroid function studies No results for input(s): TSH, T4TOTAL, T3FREE, THYROIDAB in the last 72 hours.  Invalid input(s): FREET3 Anemia work up No results for input(s): VITAMINB12, FOLATE, FERRITIN, TIBC, IRON, RETICCTPCT in the last 72 hours. Microbiology No results found for this or any previous visit (from the past 240 hour(s)).   Discharge Instructions:   Discharge Instructions    Call MD for:  persistant nausea and vomiting    Complete by:  As directed      Call MD for:  severe uncontrolled pain    Complete by:  As directed      Call MD for:  temperature >100.4    Complete by:  As directed      Diet - low sodium heart healthy    Complete by:  As  directed      Increase activity slowly    Complete by:  As directed             Medication List    STOP taking these medications        ibuprofen 200 MG tablet  Commonly known as:  ADVIL,MOTRIN     ibuprofen 800 MG tablet  Commonly known as:  ADVIL,MOTRIN     penicillin v potassium 500 MG tablet  Commonly known as:  VEETID     ticagrelor 90 MG Tabs tablet  Commonly known as:  BRILINTA      TAKE these medications        aspirin 81 MG chewable tablet  Chew 1 tablet (81 mg total) by mouth daily.     HYDROcodone-acetaminophen 5-325 MG tablet  Commonly known as:  NORCO/VICODIN  Take 1-2 tablets by mouth every 4 (four) hours as needed for moderate pain.     metoprolol tartrate 25 MG tablet  Commonly known as:  LOPRESSOR  Take 1 tablet (25 mg total) by mouth 2 (two) times daily.     nitroGLYCERIN 0.4 MG SL tablet  Commonly known as:  NITROSTAT  Place 1 tablet (0.4 mg total) under the tongue every 5 (five) minutes x 3 doses as needed for  chest pain.     rosuvastatin 10 MG tablet  Commonly known as:  CRESTOR  Take 1 tablet (10 mg total) by mouth daily.           Follow-up Information    Go to [facility], [facility].   Specialty:  Gastroenterology   Why:  Office will call with an appt time.   Contact information:   [address] [place] [address] [phone]        Time coordinating discharge: 35 minutes.  Signed:  Shyniece Scripter  Pager [phone] [facility] 03/18/2015, 2:29 PM

## 2015-03-22 ENCOUNTER — Emergency Department (HOSPITAL_COMMUNITY): Payer: BLUE CROSS/BLUE SHIELD

## 2015-03-22 ENCOUNTER — Emergency Department (HOSPITAL_COMMUNITY)
Admission: EM | Admit: 2015-03-22 | Discharge: 2015-03-22 | Disposition: A | Discharge status: Home / Self Care | Payer: BLUE CROSS/BLUE SHIELD | Attending: Emergency Medicine | Admitting: Emergency Medicine

## 2015-03-22 ENCOUNTER — Encounter (HOSPITAL_COMMUNITY): Payer: Self-pay | Admitting: *Deleted

## 2015-03-22 DIAGNOSIS — R1010 Upper abdominal pain, unspecified: Secondary | ICD-10-CM

## 2015-03-22 DIAGNOSIS — Z9861 Coronary angioplasty status: Secondary | ICD-10-CM | POA: Insufficient documentation

## 2015-03-22 DIAGNOSIS — R1013 Epigastric pain: Secondary | ICD-10-CM | POA: Diagnosis present

## 2015-03-22 DIAGNOSIS — I25119 Atherosclerotic heart disease of native coronary artery with unspecified angina pectoris: Secondary | ICD-10-CM | POA: Insufficient documentation

## 2015-03-22 DIAGNOSIS — Z79899 Other long term (current) drug therapy: Secondary | ICD-10-CM | POA: Insufficient documentation

## 2015-03-22 DIAGNOSIS — F17210 Nicotine dependence, cigarettes, uncomplicated: Secondary | ICD-10-CM | POA: Diagnosis not present

## 2015-03-22 DIAGNOSIS — Z9889 Other specified postprocedural states: Secondary | ICD-10-CM | POA: Diagnosis not present

## 2015-03-22 DIAGNOSIS — Z7982 Long term (current) use of aspirin: Secondary | ICD-10-CM | POA: Insufficient documentation

## 2015-03-22 DIAGNOSIS — K2970 Gastritis, unspecified, without bleeding: Secondary | ICD-10-CM | POA: Diagnosis not present

## 2015-03-22 DIAGNOSIS — I252 Old myocardial infarction: Secondary | ICD-10-CM | POA: Insufficient documentation

## 2015-03-22 LAB — BASIC METABOLIC PANEL
ANION GAP: 10 (ref 5–15)
BUN: 17 mg/dL (ref 6–20)
CHLORIDE: 106 mmol/L (ref 101–111)
CO2: 26 mmol/L (ref 22–32)
Calcium: 8.8 mg/dL — ABNORMAL LOW (ref 8.9–10.3)
Creatinine, Ser: 1.14 mg/dL (ref 0.61–1.24)
GFR calc Af Amer: >60 mL/min (ref >60)
GLUCOSE: 105 mg/dL — AB (ref 65–99)
POTASSIUM: 4 mmol/L (ref 3.5–5.1)
Sodium: 142 mmol/L (ref 135–145)

## 2015-03-22 LAB — RAPID URINE DRUG SCREEN, HOSP PERFORMED
Amphetamines: NOT DETECTED
BENZODIAZEPINES: NOT DETECTED
Barbiturates: NOT DETECTED
COCAINE: NOT DETECTED
OPIATES: NOT DETECTED
Tetrahydrocannabinol: NOT DETECTED

## 2015-03-22 LAB — CBC
HEMATOCRIT: 46.8 % (ref 39.0–52.0)
HEMOGLOBIN: 15.7 g/dL (ref 13.0–17.0)
MCH: 33.3 pg (ref 26.0–34.0)
MCHC: 33.5 g/dL (ref 30.0–36.0)
MCV: 99.2 fL (ref 78.0–100.0)
Platelets: 326 10*3/uL (ref 150–400)
RBC: 4.72 MIL/uL (ref 4.22–5.81)
RDW: 13 % (ref 11.5–15.5)
WBC: 9.6 10*3/uL (ref 4.0–10.5)

## 2015-03-22 LAB — URINALYSIS, ROUTINE W REFLEX MICROSCOPIC
Bilirubin Urine: NEGATIVE
Glucose, UA: NEGATIVE mg/dL
Hgb urine dipstick: NEGATIVE
KETONES UR: 15 mg/dL — AB
LEUKOCYTES UA: NEGATIVE
NITRITE: NEGATIVE
PH: 6 (ref 5.0–8.0)
PROTEIN: NEGATIVE mg/dL
Specific Gravity, Urine: 1.02 (ref 1.005–1.030)

## 2015-03-22 LAB — I-STAT TROPONIN, ED: Troponin i, poc: 0 ng/mL (ref 0.00–0.08)

## 2015-03-22 LAB — LIPASE, BLOOD: LIPASE: 39 U/L (ref 11–51)

## 2015-03-22 LAB — ETHANOL: Alcohol, Ethyl (B): <5 mg/dL (ref <5)

## 2015-03-22 MED ORDER — METOPROLOL TARTRATE 25 MG PO TABS: 25.0000 mg | ORAL_TABLET | Freq: Two times a day (BID) | ORAL | Status: DC

## 2015-03-22 MED ORDER — METOPROLOL TARTRATE 25 MG PO TABS
25.0000 mg | ORAL_TABLET | Freq: Once | ORAL | Status: AC
  Administered 2015-03-22: 25 mg via ORAL
  Filled 2015-03-22: qty 1

## 2015-03-22 MED ORDER — PANTOPRAZOLE SODIUM 40 MG IV SOLR
40.0000 mg | Freq: Once | INTRAVENOUS | Status: AC
  Administered 2015-03-22: 40 mg via INTRAVENOUS
  Filled 2015-03-22: qty 40

## 2015-03-22 MED ORDER — LORAZEPAM 2 MG/ML IJ SOLN
1.0000 mg | Freq: Once | INTRAMUSCULAR | Status: AC
  Administered 2015-03-22: 1 mg via INTRAVENOUS
  Filled 2015-03-22: qty 1

## 2015-03-22 MED ORDER — SODIUM CHLORIDE 0.9 % IV BOLUS (SEPSIS)
1000.0000 mL | Freq: Once | INTRAVENOUS | Status: AC
  Administered 2015-03-22: 1000 mL via INTRAVENOUS

## 2015-03-22 MED ORDER — HYDROMORPHONE HCL 1 MG/ML IJ SOLN
1.0000 mg | Freq: Once | INTRAMUSCULAR | Status: AC
  Administered 2015-03-22: 1 mg via INTRAVENOUS
  Filled 2015-03-22: qty 1

## 2015-03-22 MED ORDER — ONDANSETRON HCL 4 MG/2ML IJ SOLN
4.0000 mg | Freq: Once | INTRAMUSCULAR | Status: AC
  Administered 2015-03-22: 4 mg via INTRAVENOUS
  Filled 2015-03-22: qty 2

## 2015-03-22 MED ORDER — PANTOPRAZOLE SODIUM 40 MG PO TBEC: 40.0000 mg | DELAYED_RELEASE_TABLET | Freq: Every day | ORAL | Status: DC

## 2015-03-22 NOTE — ED Notes (Signed)
Pt tolerated ginger ale and ice chips

## 2015-03-22 NOTE — Discharge Instructions (Signed)
It was our pleasure to provide your ER care today - we hope that you feel better.  Your blood pressure is high today.  Minimize salt intake, take your metoprolol as prescribed, and follow up with primary care doctor in coming week for recheck.  Take protonix (acid blocker medication).  You may also try maalox and/or pepcid as need for symptom relief.  Follow up with your gi doctor as planned.  Return to ER if worse, new symptoms, fevers, persistent vomiting, other concern.  You were given pain medication in the ER - no driving for the next 4 hours.      Abdominal Pain, Adult Many things can cause abdominal pain. Usually, abdominal pain is not caused by a disease and will improve without treatment. It can often be observed and treated at home. Your health care provider will do a physical exam and possibly order blood tests and X-rays to help determine the seriousness of your pain. However, in many cases, more time must pass before a clear cause of the pain can be found. Before that point, your health care provider may not know if you need more testing or further treatment. HOME CARE INSTRUCTIONS Monitor your abdominal pain for any changes. The following actions may help to alleviate any discomfort you are experiencing:  Only take over-the-counter or prescription medicines as directed by your health care provider.  Do not take laxatives unless directed to do so by your health care provider.  Try a clear liquid diet (broth, tea, or water) as directed by your health care provider. Slowly move to a bland diet as tolerated. SEEK MEDICAL CARE IF:  You have unexplained abdominal pain.  You have abdominal pain associated with nausea or diarrhea.  You have pain when you urinate or have a bowel movement.  You experience abdominal pain that wakes you in the night.  You have abdominal pain that is worsened or improved by eating food.  You have abdominal pain that is worsened with eating  fatty foods.  You have a fever. SEEK IMMEDIATE MEDICAL CARE IF:  Your pain does not go away within 2 hours.  You keep throwing up (vomiting).  Your pain is felt only in portions of the abdomen, such as the right side or the left lower portion of the abdomen.  You pass bloody or black tarry stools. MAKE SURE YOU:  Understand these instructions.  Will watch your condition.  Will get help right away if you are not doing well or get worse.   This information is not intended to replace advice given to you by your health care provider. Make sure you discuss any questions you have with your health care provider.   Document Released: 12/06/2004 Document Revised: 11/17/2014 Document Reviewed: 11/05/2012 Elsevier Interactive Patient Education 2016 [employer] Inc[employer]    Gastritis, Adult Gastritis is soreness and swelling (inflammation) of the lining of the stomach. Gastritis can develop as a sudden onset (acute) or long-term (chronic) condition. If gastritis is not treated, it can lead to stomach bleeding and ulcers. CAUSES  Gastritis occurs when the stomach lining is weak or damaged. Digestive juices from the stomach then inflame the weakened stomach lining. The stomach lining may be weak or damaged due to viral or bacterial infections. One common bacterial infection is the Helicobacter pylori infection. Gastritis can also result from excessive alcohol consumption, taking certain medicines, or having too much acid in the stomach.  SYMPTOMS  In some cases, there are no symptoms. When symptoms are present,  they may include:  Pain or a burning sensation in the upper abdomen.  Nausea.  Vomiting.  An uncomfortable feeling of fullness after eating. DIAGNOSIS  Your caregiver may suspect you have gastritis based on your symptoms and a physical exam. To determine the cause of your gastritis, your caregiver may perform the following:  Blood or stool tests to check for the H pylori  bacterium.  Gastroscopy. A thin, flexible tube (endoscope) is passed down the esophagus and into the stomach. The endoscope has a light and camera on the end. Your caregiver uses the endoscope to view the inside of the stomach.  Taking a tissue sample (biopsy) from the stomach to examine under a microscope. TREATMENT  Depending on the cause of your gastritis, medicines may be prescribed. If you have a bacterial infection, such as an H pylori infection, antibiotics may be given. If your gastritis is caused by too much acid in the stomach, H2 blockers or antacids may be given. Your caregiver may recommend that you stop taking aspirin, ibuprofen, or other nonsteroidal anti-inflammatory drugs (NSAIDs). HOME CARE INSTRUCTIONS  Only take over-the-counter or prescription medicines as directed by your caregiver.  If you were given antibiotic medicines, take them as directed. Finish them even if you start to feel better.  Drink enough fluids to keep your urine clear or pale yellow.  Avoid foods and drinks that make your symptoms worse, such as:  Caffeine or alcoholic drinks.  Chocolate.  Peppermint or mint flavorings.  Garlic and onions.  Spicy foods.  Citrus fruits, such as oranges, lemons, or limes.  Tomato-based foods such as sauce, chili, salsa, and pizza.  Fried and fatty foods.  Eat small, frequent meals instead of large meals. SEEK IMMEDIATE MEDICAL CARE IF:   You have black or dark red stools.  You vomit blood or material that looks like coffee grounds.  You are unable to keep fluids down.  Your abdominal pain gets worse.  You have a fever.  You do not feel better after 1 week.  You have any other questions or concerns. MAKE SURE YOU:  Understand these instructions.  Will watch your condition.  Will get help right away if you are not doing well or get worse.   This information is not intended to replace advice given to you by your health care provider. Make  sure you discuss any questions you have with your health care provider.   Document Released: 02/20/2001 Document Revised: 08/28/2011 Document Reviewed: 04/11/2011 Elsevier Interactive Patient Education 2016 [employer].    Hypertension Hypertension, commonly called high blood pressure, is when the force of blood pumping through your arteries is too strong. Your arteries are the blood vessels that carry blood from your heart throughout your body. A blood pressure reading consists of a higher number over a lower number, such as 110/72. The higher number (systolic) is the pressure inside your arteries when your heart pumps. The lower number (diastolic) is the pressure inside your arteries when your heart relaxes. Ideally you want your blood pressure below 120/80. Hypertension forces your heart to work harder to pump blood. Your arteries may become narrow or stiff. Having untreated or uncontrolled hypertension can cause heart attack, stroke, kidney disease, and other problems. RISK FACTORS Some risk factors for high blood pressure are controllable. Others are not.  Risk factors you cannot control include:   Race. You may be at higher risk if you are African American.  Age. Risk increases with age.  Gender. Men are  at higher risk than women before age 53 years. After age 18, women are at higher risk than men. Risk factors you can control include:  Not getting enough exercise or physical activity.  Being overweight.  Getting too much fat, sugar, calories, or salt in your diet.  Drinking too much alcohol. SIGNS AND SYMPTOMS Hypertension does not usually cause signs or symptoms. Extremely high blood pressure (hypertensive crisis) may cause headache, anxiety, shortness of breath, and nosebleed. DIAGNOSIS To check if you have hypertension, your health care provider will measure your blood pressure while you are seated, with your arm held at the level of your heart. It should be measured at  least twice using the same arm. Certain conditions can cause a difference in blood pressure between your right and left arms. A blood pressure reading that is higher than normal on one occasion does not mean that you need treatment. If it is not clear whether you have high blood pressure, you may be asked to return on a different day to have your blood pressure checked again. Or, you may be asked to monitor your blood pressure at home for 1 or more weeks. TREATMENT Treating high blood pressure includes making lifestyle changes and possibly taking medicine. Living a healthy lifestyle can help lower high blood pressure. You may need to change some of your habits. Lifestyle changes may include:  Following the DASH diet. This diet is high in fruits, vegetables, and whole grains. It is low in salt, red meat, and added sugars.  Keep your sodium intake below 2,300 mg per day.  Getting at least 30-45 minutes of aerobic exercise at least 4 times per week.  Losing weight if necessary.  Not smoking.  Limiting alcoholic beverages.  Learning ways to reduce stress. Your health care provider may prescribe medicine if lifestyle changes are not enough to get your blood pressure under control, and if one of the following is true:  You are 71-65 years of age and your systolic blood pressure is above 140.  You are 78 years of age or older, and your systolic blood pressure is above 150.  Your diastolic blood pressure is above 90.  You have diabetes, and your systolic blood pressure is over 140 or your diastolic blood pressure is over 90.  You have kidney disease and your blood pressure is above 140/90.  You have heart disease and your blood pressure is above 140/90. Your personal target blood pressure may vary depending on your medical conditions, your age, and other factors. HOME CARE INSTRUCTIONS  Have your blood pressure rechecked as directed by your health care provider.   Take medicines only as  directed by your health care provider. Follow the directions carefully. Blood pressure medicines must be taken as prescribed. The medicine does not work as well when you skip doses. Skipping doses also puts you at risk for problems.  Do not smoke.   Monitor your blood pressure at home as directed by your health care provider. SEEK MEDICAL CARE IF:   You think you are having a reaction to medicines taken.  You have recurrent headaches or feel dizzy.  You have swelling in your ankles.  You have trouble with your vision. SEEK IMMEDIATE MEDICAL CARE IF:  You develop a severe headache or confusion.  You have unusual weakness, numbness, or feel faint.  You have severe chest or abdominal pain.  You vomit repeatedly.  You have trouble breathing. MAKE SURE YOU:   Understand these instructions.  Will  watch your condition.  Will get help right away if you are not doing well or get worse.   This information is not intended to replace advice given to you by your health care provider. Make sure you discuss any questions you have with your health care provider.   Document Released: 02/26/2005 Document Revised: 07/13/2014 Document Reviewed: 12/19/2012 Elsevier Interactive Patient Education [employer]2016 [employer] Inc.

## 2015-03-22 NOTE — ED Provider Notes (Signed)
CSN: [id number]     Arrival date & time 03/22/15  7829 History   First MD Initiated Contact with Patient 03/22/15 1147     Chief Complaint  Patient presents with  . Abdominal Pain     (Consider location/radiation/quality/duration/timing/severity/associated sxs/prior Treatment) Patient is a 50 y.o. male presenting with abdominal pain. The history is provided by the patient.  Abdominal Pain Associated symptoms: no chest pain, no chills, no cough, no diarrhea, no dysuria, no fever, no shortness of breath, no sore throat and no vomiting   Patient c/o epigastric pain for the past several days. Constant. Dull. Radiates up to chest. At rest. No relation to activity or exertion. Not related to eating. States pain mod-severe. ?similar to prior pancreatitis pain. Denies prior abd surgery. No hx gallstones. Normal appetite, intermittent nausea in past. No diarrhea. Had normal bm today. No abd distension. No fever or chills. Denies cough or uri c/o. No trauma/fall/injury.     Past Medical History  Diagnosis Date  . Tobacco abuse   . Coronary artery disease   . Anginal pain (HCC)   . GERD (gastroesophageal reflux disease)   . NSTEMI (non-ST elevated myocardial infarction) (HCC) 01/2014   Past Surgical History  Procedure Laterality Date  . Coronary angioplasty with stent placement  01/18/2014    "1"  . Left heart catheterization with coronary angiogram N/A 01/18/2014    Procedure: LEFT HEART CATHETERIZATION WITH CORONARY ANGIOGRAM;  Surgeon: Runell Gess, MD;  Location: [facility] CATH LAB;  Service: Cardiovascular;  Laterality: N/A;   History reviewed. No pertinent family history. Social History  Substance Use Topics  . Smoking status: Current Every Day Smoker -- 1.00 packs/day for 31 years    Types: Cigarettes  . Smokeless tobacco: Never Used  . Alcohol Use: 12.6 oz/week    0 Standard drinks or equivalent, 21 Cans of beer per week     Comment: 01/18/2014 " ~3 beers/day"    Review of Systems   Constitutional: Negative for fever and chills.  HENT: Negative for sore throat.   Eyes: Negative for redness.  Respiratory: Negative for cough and shortness of breath.   Cardiovascular: Negative for chest pain.  Gastrointestinal: Positive for abdominal pain. Negative for vomiting and diarrhea.  Genitourinary: Negative for dysuria and flank pain.  Musculoskeletal: Negative for back pain and neck pain.  Skin: Negative for rash.  Neurological: Negative for headaches.  Hematological: Does not bruise/bleed easily.  Psychiatric/Behavioral: Negative for confusion.      Allergies  Review of patient's allergies indicates no known allergies.  Home Medications   Prior to Admission medications   Medication Sig Start Date End Date Taking? Authorizing Provider  aspirin EC 81 MG tablet Take 81 mg by mouth daily.   Yes Historical Provider, MD  HYDROcodone-acetaminophen (NORCO/VICODIN) 5-325 MG tablet Take 1-2 tablets by mouth every 4 (four) hours as needed for moderate pain. 03/10/15  Yes Maryruth Bun Rama, MD  ibuprofen (ADVIL,MOTRIN) 200 MG tablet Take 600 mg by mouth every 4 (four) hours as needed for fever, headache, mild pain, moderate pain or cramping.   Yes Historical Provider, MD  naproxen sodium (ANAPROX) 220 MG tablet Take 440 mg by mouth once.   Yes Historical Provider, MD  aspirin 81 MG chewable tablet Chew 1 tablet (81 mg total) by mouth daily. Patient not taking: Reported on 03/22/2015 01/19/14   Othella Boyer, MD  metoprolol tartrate (LOPRESSOR) 25 MG tablet Take 1 tablet (25 mg total) by mouth 2 (two) times daily.  Patient not taking: Reported on 03/05/2015 01/19/14   Othella Boyer, MD  nitroGLYCERIN (NITROSTAT) 0.4 MG SL tablet Place 1 tablet (0.4 mg total) under the tongue every 5 (five) minutes x 3 doses as needed for chest pain. Patient not taking: Reported on 03/22/2015 01/19/14   Othella Boyer, MD  rosuvastatin (CRESTOR) 10 MG tablet Take 1 tablet (10 mg total) by mouth  daily. Patient not taking: Reported on 03/05/2015 01/19/14   Othella Boyer, MD   BP 180/110 mmHg  Pulse 99  Temp(Src) 97.6 F (36.4 C)  Resp 16  Ht 5\' 11"  (1.803 m)  Wt 68.493 kg  BMI 21.07 kg/m2  SpO2 95% Physical Exam  Constitutional: He is oriented to person, place, and time. He appears well-developed and well-nourished. No distress.  HENT:  Mouth/Throat: Oropharynx is clear and moist.  Eyes: Conjunctivae are normal. No scleral icterus.  Neck: Neck supple. No tracheal deviation present.  Cardiovascular: Normal rate, regular rhythm, normal heart sounds and intact distal pulses.  Exam reveals no gallop and no friction rub.   No murmur heard. Pulmonary/Chest: Effort normal and breath sounds normal. No accessory muscle usage. No respiratory distress.  Abdominal: Soft. Bowel sounds are normal. He exhibits no distension and no mass. There is tenderness. There is no rebound and no guarding.  Mild epigastric tenderness, no rebound or guarding.   Genitourinary:  No cva tenderness  Musculoskeletal: Normal range of motion. He exhibits no edema or tenderness.  Neurological: He is alert and oriented to person, place, and time.  Skin: Skin is warm and dry. No rash noted.  Psychiatric: He has a normal mood and affect.  Nursing note and vitals reviewed.   ED Course  Procedures (including critical care time) Labs Review  Results for orders placed or performed during the hospital encounter of 03/22/15  Basic metabolic panel  Result Value Ref Range   Sodium 142 135 - 145 mmol/L   Potassium 4.0 3.5 - 5.1 mmol/L   Chloride 106 101 - 111 mmol/L   CO2 26 22 - 32 mmol/L   Glucose, Bld 105 (H) 65 - 99 mg/dL   BUN 17 6 - 20 mg/dL   Creatinine, Ser 1.61 0.61 - 1.24 mg/dL   Calcium 8.8 (L) 8.9 - 10.3 mg/dL   GFR calc non Af Amer >60 >60 mL/min   GFR calc Af Amer >60 >60 mL/min   Anion gap 10 5 - 15  CBC  Result Value Ref Range   WBC 9.6 4.0 - 10.5 K/uL   RBC 4.72 4.22 - 5.81 MIL/uL    Hemoglobin 15.7 13.0 - 17.0 g/dL   HCT 09.6 04.5 - 40.9 %   MCV 99.2 78.0 - 100.0 fL   MCH 33.3 26.0 - 34.0 pg   MCHC 33.5 30.0 - 36.0 g/dL   RDW 81.1 91.4 - 78.2 %   Platelets 326 150 - 400 K/uL  Lipase, blood  Result Value Ref Range   Lipase 39 11 - 51 U/L  Urinalysis, Routine w reflex microscopic (not at [facility])  Result Value Ref Range   Color, Urine YELLOW YELLOW   APPearance CLEAR CLEAR   Specific Gravity, Urine 1.020 1.005 - 1.030   pH 6.0 5.0 - 8.0   Glucose, UA NEGATIVE NEGATIVE mg/dL   Hgb urine dipstick NEGATIVE NEGATIVE   Bilirubin Urine NEGATIVE NEGATIVE   Ketones, ur 15 (A) NEGATIVE mg/dL   Protein, ur NEGATIVE NEGATIVE mg/dL   Nitrite NEGATIVE NEGATIVE   Leukocytes, UA  NEGATIVE NEGATIVE  I-stat troponin, ED [facility])  Result Value Ref Range   Troponin i, poc 0.00 0.00 - 0.08 ng/mL   Comment 3           Dg Chest 2 View  03/22/2015  CLINICAL DATA:  Left side chest pain for few days with worsening EXAM: CHEST  2 VIEW COMPARISON:  01/18/2014 FINDINGS: Cardiomediastinal silhouette is stable. No acute infiltrate or pleural effusion. No pulmonary edema. Bony thorax is unremarkable. IMPRESSION: No active cardiopulmonary disease. Electronically Signed   By: Natasha MeadLiviu  Pop M.D.   On: 03/22/2015 10:49    I have personally reviewed and evaluated these images and lab results as part of my medical decision-making.   EKG Interpretation   Date/Time:  Tuesday March 22 2015 10:05:07 EST Ventricular Rate:  99 PR Interval:  153 QRS Duration: 88 QT Interval:  358 QTC Calculation: 459 R Axis:   62 Text Interpretation:  Sinus rhythm No significant change since last  tracing Confirmed by Yarima Penman  MD, Caryn BeeKEVIN ([id number]) on 03/22/2015 11:57:29 AM      MDM   Iv ns bolus. Dilaudid 1 mg iv. zofran iv. protonix iv.  Reviewed nursing notes and prior charts for additional history.   On review prior notes/imaging - no convincing evidence acute/severe pancreatitis then.  Hx etoh  abuse, ?gastritis/duodenitis.    Lipase normal.  Afeb.   After symptoms present x days, constant, trop neg - does not appear c/w acs or cardiac cp.   Pt indicates does have f/u arranged w gi for 1 week from now, relating to prior abn imaging studies.  Pt indicates out of his bp med.  Metoprolol po.  Po fluids.  No nv. abd exam benign.   Recheck bp 164/100.  Pt currently appears stable for d/c.       Cathren LaineKevin Macaiah Mangal, MD 03/22/15 1351

## 2015-03-22 NOTE — ED Notes (Signed)
Pt reports hx of pancreatitis, was admitted 12/24, has not drank ETOH since then. Reports chronic abd pain, but pain increase this morning. Pain 10/10.

## 2015-03-24 ENCOUNTER — Emergency Department (HOSPITAL_COMMUNITY)
Admission: EM | Admit: 2015-03-24 | Discharge: 2015-03-25 | Disposition: A | Discharge status: Home / Self Care | Payer: BLUE CROSS/BLUE SHIELD | Attending: Emergency Medicine | Admitting: Emergency Medicine

## 2015-03-24 ENCOUNTER — Encounter (HOSPITAL_COMMUNITY): Payer: Self-pay | Admitting: Emergency Medicine

## 2015-03-24 ENCOUNTER — Emergency Department (HOSPITAL_COMMUNITY): Payer: BLUE CROSS/BLUE SHIELD

## 2015-03-24 DIAGNOSIS — S29001A Unspecified injury of muscle and tendon of front wall of thorax, initial encounter: Secondary | ICD-10-CM | POA: Diagnosis present

## 2015-03-24 DIAGNOSIS — F17210 Nicotine dependence, cigarettes, uncomplicated: Secondary | ICD-10-CM | POA: Diagnosis not present

## 2015-03-24 DIAGNOSIS — Y998 Other external cause status: Secondary | ICD-10-CM | POA: Insufficient documentation

## 2015-03-24 DIAGNOSIS — Y9289 Other specified places as the place of occurrence of the external cause: Secondary | ICD-10-CM | POA: Diagnosis not present

## 2015-03-24 DIAGNOSIS — S2232XA Fracture of one rib, left side, initial encounter for closed fracture: Secondary | ICD-10-CM | POA: Diagnosis not present

## 2015-03-24 DIAGNOSIS — Z8719 Personal history of other diseases of the digestive system: Secondary | ICD-10-CM | POA: Diagnosis not present

## 2015-03-24 DIAGNOSIS — I252 Old myocardial infarction: Secondary | ICD-10-CM | POA: Insufficient documentation

## 2015-03-24 DIAGNOSIS — Y9389 Activity, other specified: Secondary | ICD-10-CM | POA: Diagnosis not present

## 2015-03-24 DIAGNOSIS — Z9889 Other specified postprocedural states: Secondary | ICD-10-CM | POA: Insufficient documentation

## 2015-03-24 DIAGNOSIS — W000XXA Fall on same level due to ice and snow, initial encounter: Secondary | ICD-10-CM | POA: Insufficient documentation

## 2015-03-24 DIAGNOSIS — I25119 Atherosclerotic heart disease of native coronary artery with unspecified angina pectoris: Secondary | ICD-10-CM | POA: Diagnosis not present

## 2015-03-24 DIAGNOSIS — Z9861 Coronary angioplasty status: Secondary | ICD-10-CM | POA: Diagnosis not present

## 2015-03-24 MED ORDER — OXYCODONE-ACETAMINOPHEN 5-325 MG PO TABS
1.0000 | ORAL_TABLET | Freq: Once | ORAL | Status: AC
  Administered 2015-03-24: 1 via ORAL
  Filled 2015-03-24: qty 1

## 2015-03-24 NOTE — ED Notes (Signed)
Patient reports slipping on ice two days ago, denies LOC, c/o left sided rib pain, increased pain with deep inhalation. Rates pain 10/10.

## 2015-03-25 MED ORDER — IBUPROFEN 800 MG PO TABS: 800.0000 mg | ORAL_TABLET | Freq: Three times a day (TID) | ORAL | Status: DC

## 2015-03-25 MED ORDER — OXYCODONE-ACETAMINOPHEN 5-325 MG PO TABS: 1.0000 | ORAL_TABLET | ORAL | Status: DC | PRN

## 2015-03-25 NOTE — ED Provider Notes (Signed)
CSN: [id number]     Arrival date & time 03/24/15  2239 History   First MD Initiated Contact with Patient 03/25/15 0015     Chief Complaint  Patient presents with  . Rib Injury     (Consider location/radiation/quality/duration/timing/severity/associated sxs/prior Treatment) HPI   Minerva EndsJonathan Crite is a 50 y.o. male, with a history of one pack per day smoking, presenting to the ED with left rib pain following a fall 3 days ago. Patient states that he slipped on some ice and landed on his left chest. Patient currently rates his pain at 10 out of 10 due to movement, sharp in nature, nonradiating. Patient denies shortness of breath, LOC, hemoptysis, head trauma, other chest pain, nausea or vomiting, or any other pain or complaints. Patient also denies any other injuries.  Past Medical History  Diagnosis Date  . Tobacco abuse   . Coronary artery disease   . Anginal pain (HCC)   . GERD (gastroesophageal reflux disease)   . NSTEMI (non-ST elevated myocardial infarction) (HCC) 01/2014   Past Surgical History  Procedure Laterality Date  . Coronary angioplasty with stent placement  01/18/2014    "1"  . Left heart catheterization with coronary angiogram N/A 01/18/2014    Procedure: LEFT HEART CATHETERIZATION WITH CORONARY ANGIOGRAM;  Surgeon: Runell GessJonathan J Berry, MD;  [facility];  Service: Cardiovascular;  Laterality: N/A;   No family history on file. Social History  Substance Use Topics  . Smoking status: Current Every Day Smoker -- 1.00 packs/day for 31 years    Types: Cigarettes  . Smokeless tobacco: Never Used  . Alcohol Use: 12.6 oz/week    0 Standard drinks or equivalent, 21 Cans of beer per week     Comment: 01/18/2014 " ~3 beers/day"    Review of Systems  Constitutional: Negative for diaphoresis.  Respiratory: Negative for shortness of breath.   Musculoskeletal: Negative for back pain and neck pain.       Left rib pain.  All other systems reviewed and are  negative.     Allergies  Review of patient's allergies indicates no known allergies.  Home Medications   Prior to Admission medications   Medication Sig Start Date End Date Taking? Authorizing Provider  ibuprofen (ADVIL,MOTRIN) 200 MG tablet Take 600 mg by mouth every 4 (four) hours as needed for fever, headache, mild pain, moderate pain or cramping.   Yes Historical Provider, MD  nitroGLYCERIN (NITROSTAT) 0.4 MG SL tablet Place 1 tablet (0.4 mg total) under the tongue every 5 (five) minutes x 3 doses as needed for chest pain. 01/19/14  Yes Othella BoyerWilliam S Tilley, MD  ibuprofen (ADVIL,MOTRIN) 800 MG tablet Take 1 tablet (800 mg total) by mouth 3 (three) times daily. 03/25/15   Shawn C Joy, PA-C  oxyCODONE-acetaminophen (PERCOCET/ROXICET) 5-325 MG tablet Take 1-2 tablets by mouth every 4 (four) hours as needed for severe pain. 03/25/15   Shawn C Joy, PA-C   BP 141/97 mmHg  Pulse 100  Temp(Src) 98.5 F (36.9 C) (Oral)  Resp 18  SpO2 99% Physical Exam  Constitutional: He is oriented to person, place, and time. He appears well-developed and well-nourished. No distress.  HENT:  Head: Normocephalic and atraumatic.  Eyes: Conjunctivae are normal. Pupils are equal, round, and reactive to light.  Cardiovascular: Normal rate, regular rhythm and normal heart sounds.   Pulmonary/Chest: Effort normal and breath sounds normal. No respiratory distress.  Tenderness to the left lateral ribs around ribs 7 through 9. No crepitus, subcutaneous emphysema,  or flail segments. No other tenderness found.  Abdominal: Soft. Bowel sounds are normal.  Musculoskeletal: He exhibits no edema or tenderness.  Full ROM in all extremities and spine. No paraspinal tenderness.   Neurological: He is alert and oriented to person, place, and time.  No sensory deficits. Strength 5 out of 5 in all extremities.  Skin: Skin is warm and dry. He is not diaphoretic.  Nursing note and vitals reviewed.   ED Course  Procedures  (including critical care time) Labs Review Labs Reviewed - No data to display  Imaging Review Dg Ribs Unilateral W/chest Left  03/24/2015  CLINICAL DATA:  50 year old male with fall and left rib pain. EXAM: LEFT RIBS AND CHEST - 3+ VIEW COMPARISON:  Chest radiograph dated 03/22/2015 FINDINGS: There is nondisplaced fracture of the lateral left 8th rib. No pneumothorax. The lungs are clear. A stable 4 mm right upper lobe calcific nodule, likely a granuloma. The soft tissues appear unremarkable. IMPRESSION: Nondisplaced fracture of the lateral aspect of the left eighth rib. No pneumothorax. Electronically Signed   By: Elgie Collard M.D.   On: 03/24/2015 23:46   I have personally reviewed and evaluated these images and lab results as part of my medical decision-making.   EKG Interpretation None      MDM   Final diagnoses:  Left rib fracture, closed, initial encounter    Hoover Grewe presents with left rib pain following a fall 3 days ago.  This patient's presentation is consistent with the x-ray findings of a single nondisplaced rib fracture. This patient demonstrates that he is able to take a deep breath, maintain SpO2 of 99% on room air, and use incentive spirometry here in the ED. Patient was told about the x-ray results. Patient was also counseled on incentive spirometry and its importance in preventing pneumonia. Patient was also informed that he would need to follow up with a PCP within the next week for chronic management, to include pain medication. Patient shows no signs of any other injuries. The patient was given instructions for home care as well as return precautions. Patient voices understanding of these instructions, accepts the plan, and is comfortable with discharge.  Filed Vitals:   03/24/15 2302  BP: 141/97  Pulse: 100  Temp: 98.5 F (36.9 C)  TempSrc: Oral  Resp: 18  SpO2: 99%     Anselm Pancoast, PA-C 03/25/15 0100  Dione Booze, MD 03/25/15 [phone]

## 2015-03-25 NOTE — Discharge Instructions (Signed)
You have been seen today for rib pain. Your x-ray shows a single rib fracture with no lung issues. Follow up with PCP as needed for chronic management, to include pain medication. Return to ED should symptoms worsen.   Emergency Department Resource Guide 1) Find a Doctor and Pay Out of Pocket Although you won't have to find out who is covered by your insurance plan, it is a good idea to ask around and get recommendations. You will then need to call the office and see if the doctor you have chosen will accept you as a new patient and what types of options they offer for patients who are self-pay. Some doctors offer discounts or will set up payment plans for their patients who do not have insurance, but you will need to ask so you aren't surprised when you get to your appointment.  2) Contact Your Local Health Department Not all health departments have doctors that can see patients for sick visits, but many do, so it is worth a call to see if yours does. If you don't know where your local health department is, you can check in your phone book. The CDC also has a tool to help you locate your state's health department, and many state websites also have listings of all of their local health departments.  3) Find a Walk-in Clinic If your illness is not likely to be very severe or complicated, you may want to try a walk in clinic. These are popping up all over the country in pharmacies, drugstores, and shopping centers. They're usually staffed by nurse practitioners or physician assistants that have been trained to treat common illnesses and complaints. They're usually fairly quick and inexpensive. However, if you have serious medical issues or chronic medical problems, these are probably not your best option.  No Primary Care Doctor: - Call Health Connect at  [phone] - they can help you locate a primary care doctor that  accepts your insurance, provides certain services, etc. - Physician Referral Service-  [phone]  Chronic Pain Problems: Organization         Address  Phone   Notes  [facility]  [phone]) [phone] Patients need to be referred by their primary care doctor.   Medication Assistance: Organization         Address  Phone   Notes  [facility] Medication [facility] [address] [place], [place] [address] [phone] --Must be a resident of [facility] [place] -- Must have NO insurance coverage whatsoever (no Medicaid/ Medicare, etc.) -- The pt. MUST have a primary care doctor that directs their care regularly and follows them in the community   MedAssist  [phone]   [employer]  [phone]    Agencies that provide inexpensive medical care: Organization         Address  Phone   Notes  [facility]  [phone]   [facility]    [phone]   [facility] [address] [place], [place] [address] [phone]) [phone]   [facility] [address], [place] [phone]) [phone]   [facility]anned Parenthood    [phone]   [facility]    [phone]) [phone]   [facility]  [address], [place] Phone:  [phone], Fax:  [phone]) [phone] Hours of Operation:  9 am - 6 pm, M-F.  Also accepts Medicaid/Medicare and self-pay.  [facility] for [place] [address], [address], [place] Phone: [phone], Fax: [phone]. Hours of Operation:  8:30 am - 5:30 pm, M-F.  Also accepts Medicaid and self-pay.  [facility] [place] [address] [place] Phone: [phone]   [place], [place], [place] [phone], Ext. [phone] Mondays & Thursdays: 7-9 AM.  First 15 patients are seen on a first come, first serve basis.    [place] Providers:  Organization         Address  Phone   Notes  [facility] [address],  [place] [phone] Also accepts self-pay patients.  [facility] [id number] [place], [place]  [phone]   [place], [address], [place] [phone]   [facility] [address] [place] [phone]   Lucianne Lei [address], [place]   [phone] Only accepts [place] Access [place] patients after they have their name applied to their card.   Self-Pay (no insurance) in [facility]:  Organization         Address  Phone   Notes  Sickle Cell Patients, [facility] [place] [phone]   [facility] [place] [phone]   [facility] [place]  [place], [place], [place] [phone]   [facility]/Dr. Osei-Bonsu  [address] [place] [address] [place] or [address], [place] [phone] Phone number for both [place] and [place] locations is the same.  Urgent Medical and [facility] [address], [place] [phone]   [facility] [address] [place] or [address] [phone]   [facility] [address], [place] [phone], phone; [phone], fax Sees patients 1st and 3rd Saturday of every month.  Must not qualify for public or private insurance (i.e. Medicaid, Medicare, [place] Health Choice, Veterans' Benefits)  Household income should be no more than 200% of the poverty level The clinic cannot treat you if you are pregnant or think you are pregnant  Sexually transmitted diseases are not treated at the clinic.    Dental Care: Organization         Address  Phone  Notes  [facility] of [facility] [place] [phone] Accepts children up to age 81 who are enrolled in  [place] or [place]; pregnant women with a Medicaid card; and children who have applied for Medicaid or  Health Choice, but were declined, whose parents can pay a reduced fee at time of service.  [facility] of [facility]  [address], [place] [phone] Accepts children up to age 44 who are enrolled in [place] or [place]; pregnant women with a Medicaid card; and children who have applied for Medicaid or  Health Choice, but were declined, whose parents can pay a reduced fee at time of service.  [facility]  [place] [phone] Patients are seen by appointment only. Walk-ins are not accepted. [place] will see patients 52 years of age and older. Monday - Tuesday (8am-5pm) Most Wednesdays (8:30-5pm) $30 per  visit, cash only  [facility] Adult [employer] PROGRAM  [address], [address] [phone] Patients are seen by appointment only. Walk-ins are not accepted. [place] will see patients 48 years of age and older. One Wednesday Evening (Monthly: Volunteer Based).  $30 per visit, cash only  [place]  [phone] for adults; Children under age 62, call Graduate Pediatric Dentistry at [phone]. Children aged 41-14, please call [phone] to request a pediatric application.  Dental services are provided in all areas of dental care including fillings, crowns and bridges, complete and partial dentures, implants, gum treatment, root canals, and extractions. Preventive care is also provided. Treatment is provided to both adults and children. Patients are selected via a lottery and there is often a waiting list.   [facility] [address] [place]  [phone] [email or web address]   [facility] [address] [place], [place] [phone], Ext. [phone] Second and Fourth Thursday of each month, opens at 6:30  AM; Clinic ends at 9 AM.  Patients are seen on a first-come first-served basis, and a limited number are seen during each clinic.   [facility]  [address] Hillard Danker [place], [place] [phone]   Eligibility Requirements You must have lived in [place], [place], or [place] counties for at least the last three months.   You cannot be eligible for state or federal sponsored [employer], including [employer], [place], or [employer].   You generally cannot be eligible for healthcare insurance through your employer.    How to apply: Eligibility screenings are held every Tuesday and Wednesday afternoon from 1:00 pm until 4:00 pm. You do not need an appointment for the interview!  [facility] [address], [place], [place]   [place]  [place] Department  [place]  [phone]    Behavioral Health Resources in the Community: Intensive Outpatient Programs Organization         Address  Phone  Notes  Ecru Dyer. [address] [place], [place] [phone]   [facility] Outpatient [address] [place], [place]   ADS: Alcohol & Drug Svcs [address] [place], [place]   [place] [address]  [place], [place] or [phone]   Substance Abuse Resources Organization         Address  Phone  Notes  Alcohol and Drug Services  [phone]   [place]  [phone]   The [place]   Chinita Pester  [phone]   Residential & Outpatient Substance Abuse Program  [phone]   Psychological Services Organization         Address  Phone  Notes  [facility] [place]  [place]  [phone]   [place] [address] [place] or  [phone]    Mobile Crisis Teams Organization         Address  Phone  Notes  [facility], Mobile Crisis Care Unit  [phone]   [facility]  [address] [place], [place]   [place] Levels [address], [place] [phone]    Self-Help/Support Groups Organization         Address  Phone  Notes  [facility] of [place] - variety of support groups  [place] Call for more information  Narcotics Anonymous (NA), [facility]aring [facility] [address], [employer] [place]  2 meetings at this location   Special educational needs teacher         Address  Phone  Notes  [facility] Residential Treatment [place],    [place]  [phone]   [facility]  [address] [place] [id number], [place], [place]   [place], [place] [phone] Admissions: 8am-3pm M-F  Incentives Substance [place] [address]    [place], [place] [phone]   [facility] [address] [place], [place], [place]   [facility] [address],  [place], [place]   [facility] - Intensive Outpatient [address] Krist[address], [place], [place]   [facility] ([place] [address].,  [place], [place] [phone] or [phone]   [facility]idential Treatment Services (RTS) [address] [place], [place] Accepts Medicaid  [facility] [place] [address],  [place] [phone] Substance Abuse/Addiction Treatment   [facility] Organization         Address  Phone  Notes  [facility]  [phone]   Domenic Schwab, PhD [address] Arlis Porta [place], [place]   [phone] or [phone]   [place] and [place], [place] [phone]   [facility] [address],  [place], [place] [phone] Insurance/Medicaid/sponsorship through [facility] and Families [address], [address] [place]                                    [place], [place] [phone] [place] [address] [place], [place] [phone]    Dr. Adele Schilder  [phone]   [facility] Dept. 1) [address], [place] 2) [place] 3)  [place] [address], [place] [phone]  [phone]   [place] [phone] or [phone] (After Hours)

## 2015-03-28 ENCOUNTER — Encounter: Payer: Self-pay | Admitting: Physician Assistant

## 2015-03-28 ENCOUNTER — Ambulatory Visit (INDEPENDENT_AMBULATORY_CARE_PROVIDER_SITE_OTHER): Payer: BLUE CROSS/BLUE SHIELD | Admitting: Physician Assistant

## 2015-03-28 ENCOUNTER — Other Ambulatory Visit (INDEPENDENT_AMBULATORY_CARE_PROVIDER_SITE_OTHER): Payer: BLUE CROSS/BLUE SHIELD

## 2015-03-28 VITALS — BP 102/64 | HR 100 | Ht 70.0 in | Wt 149.2 lb

## 2015-03-28 DIAGNOSIS — K858 Other acute pancreatitis without necrosis or infection: Secondary | ICD-10-CM

## 2015-03-28 LAB — LIPASE: LIPASE: 49 U/L (ref 11.0–59.0)

## 2015-03-28 LAB — COMPREHENSIVE METABOLIC PANEL
ALBUMIN: 3.8 g/dL (ref 3.5–5.2)
ALK PHOS: 149 U/L — AB (ref 39–117)
ALT: 27 U/L (ref 0–53)
AST: 25 U/L (ref 0–37)
BUN: 8 mg/dL (ref 6–23)
CO2: 26 mEq/L (ref 19–32)
CREATININE: 1 mg/dL (ref 0.40–1.50)
Calcium: 8.7 mg/dL (ref 8.4–10.5)
Chloride: 102 mEq/L (ref 96–112)
GFR: 84.69 mL/min (ref >60.00)
Glucose, Bld: 90 mg/dL (ref 70–99)
Potassium: 3.9 mEq/L (ref 3.5–5.1)
SODIUM: 139 meq/L (ref 135–145)
TOTAL PROTEIN: 6.4 g/dL (ref 6.0–8.3)
Total Bilirubin: 0.3 mg/dL (ref 0.2–1.2)

## 2015-03-28 MED ORDER — PANTOPRAZOLE SODIUM 40 MG PO TBEC: DELAYED_RELEASE_TABLET | ORAL | Status: DC

## 2015-03-28 MED ORDER — HYDROCODONE-ACETAMINOPHEN 5-325 MG PO TABS: ORAL_TABLET | ORAL | Status: DC

## 2015-03-28 NOTE — Progress Notes (Addendum)
Patient ID: Douglas [name], male   DOB: [birth year], 50 y.o.   MRN: [id number]   Subjective:    Patient ID: Douglas [name], male    DOB: 1965-04-30, 50 y.o.   MRN: [id number]  HPI  Douglas [name]  Is a 50 year old white male new to GI when seen in consultation during recent hospitalization with acute pancreatitis. He seen today in post hospital follow-up. He does not have a PCP. Patient was diagnosed with "groove" pancreatitis  With MRI/MRCP  on 03/05/2015 showing inflammatory changes resulting in mass effect upon the distal common bile duct which is markedly narrowed shortly before the ampulla could not rule out possibility of an underlying mass also noted type I pancreas divisum anatomy. At the time of admission patient had been having upper abdominal pain and nausea for several weeks. He was drinking alcohol on a regular basis up to the time of admission. It was felt that his pancreatitis was alcohol-induced. He was also complaining of diarrhea. Pancreatic fecal elastase was checked and this was normal. We discussed possible colonoscopy as an outpatient. At the time of discharge plan was for him to have repeat MRI MRCP in about 3 months and also to be scheduled for EUS with Dr. Christella Hartigan. We discussed colonoscopy at some point once he was over the acute pancreatitis. He is seen back today in follow-up stating that he is been having ongoing abdominal pain since discharge from the hospital. He says he lost his 1 prescription for Vicodin on the bus. He has also been back to the emergency room at least once for abdominal pain and on the 12th was seen in the ER after falling on the ice and sustained a nondisplaced rib fracture on the left.  He states he has lost about 10-12 pounds from the time of hospitalization. He's had some nausea and intermittent vomiting and is not sure why he should eat because most solid food is making his pain consistently worse. He is also been taking 800 mg ibuprofen over the past few  days for the rib fracture.  Patient did return to work on December 29 and brought a release with him today his employer.  Review of Systems Pertinent positive and negative review of systems were noted in the above HPI section.  All other review of systems was otherwise negative.  Outpatient Encounter Prescriptions as of 03/28/2015  Medication Sig  . HYDROcodone-acetaminophen (NORCO/VICODIN) 5-325 MG tablet Take 1 tab every 6-8 hours as needed for pain  . ibuprofen (ADVIL,MOTRIN) 800 MG tablet Take 1 tablet (800 mg total) by mouth 3 (three) times daily.  . pantoprazole (PROTONIX) 40 MG tablet Take 1 tab by mouth every morning.  . [DISCONTINUED] ibuprofen (ADVIL,MOTRIN) 200 MG tablet Take 600 mg by mouth every 4 (four) hours as needed for fever, headache, mild pain, moderate pain or cramping.  . [DISCONTINUED] nitroGLYCERIN (NITROSTAT) 0.4 MG SL tablet Place 1 tablet (0.4 mg total) under the tongue every 5 (five) minutes x 3 doses as needed for chest pain.  . [DISCONTINUED] oxyCODONE-acetaminophen (PERCOCET/ROXICET) 5-325 MG tablet Take 1-2 tablets by mouth every 4 (four) hours as needed for severe pain.   No facility-administered encounter medications on file as of 03/28/2015.   No Known Allergies Patient Active Problem List   Diagnosis Date Noted  . Pancreatitis, acute 03/09/2015  . Alcohol abuse 03/09/2015  . Fatty liver 03/09/2015  . Pancreatitis   . Generalized abdominal pain   . Abdominal pain 03/05/2015  . Pancreatic mass 03/05/2015  . Unstable  angina pectoris (HCC) 01/18/2014  . Unstable angina (HCC) 01/18/2014  . NSTEMI (non-ST elevated myocardial infarction) (HCC) 01/18/2014  . Tobacco abuse    Social History   Social History  . Marital Status: Divorced    Spouse Name: N/A  . Number of Children: N/A  . Years of Education: N/A   Occupational History  . Not on file.   Social History Main Topics  . Smoking status: Current Every Day Smoker -- 1.00 packs/day for 31 years      Types: Cigarettes  . Smokeless tobacco: Never Used  . Alcohol Use: 12.6 oz/week    0 Standard drinks or equivalent, 21 Cans of beer per week     Comment: 01/18/2014 " ~3 beers/day"  . Drug Use: Yes    Special: Marijuana     Comment: "smoked weed occasionally when I was younger"  . Sexual Activity: Not Currently   Other Topics Concern  . Not on file   Social History Narrative   Divorced.  2 sons.  Works at a [employer] in the [employer] department.  Formerly waited tables.    Douglas [name]'s family history is not on file.      Objective:    Filed Vitals:   03/28/15 1422  BP: 102/64  Pulse: 100    Physical Exam   Well-developed thin white male in no acute distress, question EtOH odor on breath blood pressure 102/64 pulse 100 height 5 foot 10 weight 149. HEENT ;nontraumatic, cephalic EOMI PERRLA sclera anicteric, Cardiovascular; regular rate and rhythm with S1-S2 no murmur rub or gallop, Pulmonary; clear bilaterally , Abdomen; soft bowel sounds are present tender across the upper abdomen no guarding or rebound no palpable mass or hepatosplenomegaly bowel sounds present, Rectal ;exam not done, Ext;no clubbing cyanosis or edema skin warm and dry, Neuropsych; mood and affect appropriate     Assessment & Plan:   #1 50 yo male with subacute pancreatitis -"groove" pancreatitis by MRI- with ongoing abdominal pain, nausea, and weight loss Process felt inflammatory but cannot R/O underlying mass #2 ? Ongoing ETOH abuse #3 diarrhea -resolved #4Acute left rib fracture #5 Hx CAD  Plan; CBC,lipase , cmet, ETOH level today Low fat full liquid diet over next couple weeks until pain significantly improves Vicodin 5/325 mg po q 6 hours prn pain #40/0 Protonix 40 mg po QAM   MRI/MRCP scheduled already for 05/31/2014  Will get him scheduled for EUS with Dr Christella HartiganJacobs- discussed today with pt Follow up Dr Adela LankArmbruster 3-4 weeks Will do paper work for release to go back to work for pt as he has no  PCP   Hold on Colonoscopy for now.  Will route to Dr Christella HartiganJacobs to review and schedule EUS   Amy Oswald HillockS Esterwood PA-C 03/28/2015   Cc: No ref. provider found

## 2015-03-28 NOTE — Patient Instructions (Signed)
Please go to the basement level to have your labs drawn.  We have given you a low fat diet and a full liquid diet brochure. We have given you a signed prescription for Vicodin. We sent a prescription for Protonix ( Pantoprazole sodium ) 40 mg to [facility] [address], [place]. Follow up with Dr. Adela LankArmbruster as needed.

## 2015-03-29 ENCOUNTER — Telehealth: Payer: Self-pay | Admitting: Physician Assistant

## 2015-03-29 ENCOUNTER — Telehealth: Payer: Self-pay

## 2015-03-29 LAB — ETHANOL: Alcohol, Ethyl (B): 76 mg/dL — ABNORMAL HIGH (ref 0–10)

## 2015-03-29 NOTE — Telephone Encounter (Signed)
Called the patient and advised I faxed the form he needed Amy Esterwood PA to fill out at 12:45 PM.

## 2015-03-29 NOTE — Telephone Encounter (Signed)
I agree with upper EUS, +MAC sedation, would give him another 2-3 weeks to ensure the acute inflammation has resolved.         Braelynn Benning,    He needs upper EUS, MAC sedation, for recent pancreatitis, early to [facility] February. Thanks   See Mike Gip office note.

## 2015-03-30 NOTE — Progress Notes (Signed)
Agree with assessment and plan. If he is scheduled for an EUS with Dr. Christella Hartigan I don't think he would warrant MRCP as well, and the MRCP can be cancelled. Otherwise repeat labs show he continues to drink alcohol and likely contributing to his ongoing symptoms. He needs to completely abstain from alcohol.

## 2015-03-31 ENCOUNTER — Other Ambulatory Visit: Payer: Self-pay

## 2015-03-31 DIAGNOSIS — K859 Acute pancreatitis, unspecified: Secondary | ICD-10-CM

## 2015-03-31 NOTE — Telephone Encounter (Signed)
EUS scheduled, pt instructed and medications reviewed.  Patient instructions mailed to home.  Patient to call with any questions or concerns.  

## 2015-04-06 ENCOUNTER — Inpatient Hospital Stay (HOSPITAL_COMMUNITY)
Admission: EM | Admit: 2015-04-06 | Discharge: 2015-04-10 | DRG: 440 | Disposition: A | Discharge status: Home / Self Care | Payer: BLUE CROSS/BLUE SHIELD | Attending: Internal Medicine | Admitting: Internal Medicine

## 2015-04-06 ENCOUNTER — Emergency Department (HOSPITAL_COMMUNITY): Payer: BLUE CROSS/BLUE SHIELD

## 2015-04-06 ENCOUNTER — Encounter (HOSPITAL_COMMUNITY): Payer: Self-pay

## 2015-04-06 DIAGNOSIS — K858 Other acute pancreatitis without necrosis or infection: Secondary | ICD-10-CM

## 2015-04-06 DIAGNOSIS — K859 Acute pancreatitis without necrosis or infection, unspecified: Principal | ICD-10-CM | POA: Diagnosis present

## 2015-04-06 DIAGNOSIS — R100 Acute abdomen: Secondary | ICD-10-CM | POA: Diagnosis not present

## 2015-04-06 DIAGNOSIS — Z8719 Personal history of other diseases of the digestive system: Secondary | ICD-10-CM | POA: Diagnosis present

## 2015-04-06 DIAGNOSIS — Z720 Tobacco use: Secondary | ICD-10-CM | POA: Diagnosis not present

## 2015-04-06 DIAGNOSIS — Z9119 Patient's noncompliance with other medical treatment and regimen: Secondary | ICD-10-CM

## 2015-04-06 DIAGNOSIS — D7589 Other specified diseases of blood and blood-forming organs: Secondary | ICD-10-CM | POA: Diagnosis present

## 2015-04-06 DIAGNOSIS — K8500 Idiopathic acute pancreatitis without necrosis or infection: Secondary | ICD-10-CM

## 2015-04-06 DIAGNOSIS — K860 Alcohol-induced chronic pancreatitis: Secondary | ICD-10-CM | POA: Diagnosis not present

## 2015-04-06 DIAGNOSIS — K649 Unspecified hemorrhoids: Secondary | ICD-10-CM | POA: Diagnosis present

## 2015-04-06 DIAGNOSIS — F17200 Nicotine dependence, unspecified, uncomplicated: Secondary | ICD-10-CM | POA: Insufficient documentation

## 2015-04-06 DIAGNOSIS — I252 Old myocardial infarction: Secondary | ICD-10-CM | POA: Diagnosis not present

## 2015-04-06 DIAGNOSIS — K852 Alcohol induced acute pancreatitis without necrosis or infection: Secondary | ICD-10-CM | POA: Diagnosis not present

## 2015-04-06 DIAGNOSIS — F17210 Nicotine dependence, cigarettes, uncomplicated: Secondary | ICD-10-CM | POA: Diagnosis present

## 2015-04-06 DIAGNOSIS — Z955 Presence of coronary angioplasty implant and graft: Secondary | ICD-10-CM | POA: Diagnosis not present

## 2015-04-06 DIAGNOSIS — K625 Hemorrhage of anus and rectum: Secondary | ICD-10-CM | POA: Diagnosis not present

## 2015-04-06 DIAGNOSIS — R109 Unspecified abdominal pain: Secondary | ICD-10-CM | POA: Diagnosis present

## 2015-04-06 DIAGNOSIS — R1013 Epigastric pain: Secondary | ICD-10-CM | POA: Diagnosis not present

## 2015-04-06 DIAGNOSIS — I10 Essential (primary) hypertension: Secondary | ICD-10-CM | POA: Diagnosis present

## 2015-04-06 DIAGNOSIS — F1010 Alcohol abuse, uncomplicated: Secondary | ICD-10-CM | POA: Diagnosis not present

## 2015-04-06 DIAGNOSIS — I25119 Atherosclerotic heart disease of native coronary artery with unspecified angina pectoris: Secondary | ICD-10-CM | POA: Diagnosis present

## 2015-04-06 DIAGNOSIS — K869 Disease of pancreas, unspecified: Secondary | ICD-10-CM | POA: Diagnosis not present

## 2015-04-06 HISTORY — DX: Acute pancreatitis without necrosis or infection, unspecified: K85.90

## 2015-04-06 HISTORY — DX: Post-traumatic stress disorder, unspecified: F43.10

## 2015-04-06 HISTORY — DX: Personal history of other diseases of the digestive system: Z87.19

## 2015-04-06 LAB — CBC WITH DIFFERENTIAL/PLATELET
BASOS ABS: 0.1 10*3/uL (ref 0.0–0.1)
BASOS PCT: 1 %
EOS PCT: 2 %
Eosinophils Absolute: 0.2 10*3/uL (ref 0.0–0.7)
HCT: 46.2 % (ref 39.0–52.0)
Hemoglobin: 15.4 g/dL (ref 13.0–17.0)
LYMPHS PCT: 26 %
Lymphs Abs: 1.8 10*3/uL (ref 0.7–4.0)
MCH: 33.5 pg (ref 26.0–34.0)
MCHC: 33.3 g/dL (ref 30.0–36.0)
MCV: 100.4 fL — AB (ref 78.0–100.0)
MONO ABS: 0.8 10*3/uL (ref 0.1–1.0)
MONOS PCT: 11 %
Neutro Abs: 4.1 10*3/uL (ref 1.7–7.7)
Neutrophils Relative %: 60 %
PLATELETS: 271 10*3/uL (ref 150–400)
RBC: 4.6 MIL/uL (ref 4.22–5.81)
RDW: 13.7 % (ref 11.5–15.5)
WBC: 6.8 10*3/uL (ref 4.0–10.5)

## 2015-04-06 LAB — COMPREHENSIVE METABOLIC PANEL
ALBUMIN: 3.8 g/dL (ref 3.5–5.0)
ALK PHOS: 123 U/L (ref 38–126)
ALT: 21 U/L (ref 17–63)
AST: 30 U/L (ref 15–41)
Anion gap: 9 (ref 5–15)
BILIRUBIN TOTAL: 0.4 mg/dL (ref 0.3–1.2)
BUN: 8 mg/dL (ref 6–20)
CALCIUM: 8.8 mg/dL — AB (ref 8.9–10.3)
CO2: 26 mmol/L (ref 22–32)
Chloride: 108 mmol/L (ref 101–111)
Creatinine, Ser: 1.04 mg/dL (ref 0.61–1.24)
GFR calc Af Amer: >60 mL/min (ref >60)
GFR calc non Af Amer: >60 mL/min (ref >60)
GLUCOSE: 109 mg/dL — AB (ref 65–99)
Potassium: 4.1 mmol/L (ref 3.5–5.1)
SODIUM: 143 mmol/L (ref 135–145)
TOTAL PROTEIN: 6.4 g/dL — AB (ref 6.5–8.1)

## 2015-04-06 LAB — I-STAT TROPONIN, ED: Troponin i, poc: 0.01 ng/mL (ref 0.00–0.08)

## 2015-04-06 LAB — LIPASE, BLOOD: Lipase: 1159 U/L — ABNORMAL HIGH (ref 11–51)

## 2015-04-06 MED ORDER — HYDROMORPHONE HCL 1 MG/ML IJ SOLN: 1.0000 mg | INTRAMUSCULAR | Status: DC | PRN

## 2015-04-06 MED ORDER — ACETAMINOPHEN 650 MG RE SUPP: 650.0000 mg | Freq: Four times a day (QID) | RECTAL | Status: DC | PRN

## 2015-04-06 MED ORDER — NICOTINE 21 MG/24HR TD PT24
21.0000 mg | MEDICATED_PATCH | Freq: Every day | TRANSDERMAL | Status: DC
  Administered 2015-04-06 – 2015-04-10 (×5): 21 mg via TRANSDERMAL
  Filled 2015-04-06 (×5): qty 1

## 2015-04-06 MED ORDER — SODIUM CHLORIDE 0.9 % IV SOLN: INTRAVENOUS | Status: DC

## 2015-04-06 MED ORDER — HYDROMORPHONE HCL 1 MG/ML IJ SOLN
1.0000 mg | Freq: Once | INTRAMUSCULAR | Status: AC
  Administered 2015-04-06: 1 mg via INTRAVENOUS
  Filled 2015-04-06: qty 1

## 2015-04-06 MED ORDER — LACTATED RINGERS IV SOLN
INTRAVENOUS | Status: DC
  Administered 2015-04-06: 13:00:00 via INTRAVENOUS
  Administered 2015-04-06 – 2015-04-07 (×3): 1000 mL via INTRAVENOUS
  Administered 2015-04-07 – 2015-04-08 (×3): via INTRAVENOUS
  Administered 2015-04-08: 1000 mL via INTRAVENOUS
  Administered 2015-04-09 – 2015-04-10 (×3): via INTRAVENOUS

## 2015-04-06 MED ORDER — ONDANSETRON HCL 4 MG/2ML IJ SOLN
4.0000 mg | Freq: Four times a day (QID) | INTRAMUSCULAR | Status: DC | PRN
  Administered 2015-04-06 – 2015-04-08 (×4): 4 mg via INTRAVENOUS
  Filled 2015-04-06 (×4): qty 2

## 2015-04-06 MED ORDER — HYDROMORPHONE HCL 1 MG/ML IJ SOLN
1.0000 mg | INTRAMUSCULAR | Status: DC | PRN
  Administered 2015-04-06 – 2015-04-10 (×28): 1 mg via INTRAVENOUS
  Filled 2015-04-06 (×28): qty 1

## 2015-04-06 MED ORDER — ONDANSETRON HCL 4 MG/2ML IJ SOLN: 4.0000 mg | Freq: Three times a day (TID) | INTRAMUSCULAR | Status: DC | PRN

## 2015-04-06 MED ORDER — ONDANSETRON HCL 4 MG/2ML IJ SOLN
4.0000 mg | Freq: Once | INTRAMUSCULAR | Status: AC
  Administered 2015-04-06: 4 mg via INTRAVENOUS
  Filled 2015-04-06: qty 2

## 2015-04-06 MED ORDER — ACETAMINOPHEN 325 MG PO TABS
650.0000 mg | ORAL_TABLET | Freq: Four times a day (QID) | ORAL | Status: DC | PRN
  Administered 2015-04-07 – 2015-04-08 (×4): 650 mg via ORAL
  Filled 2015-04-06 (×4): qty 2

## 2015-04-06 MED ORDER — ONDANSETRON HCL 4 MG PO TABS: 4.0000 mg | ORAL_TABLET | Freq: Four times a day (QID) | ORAL | Status: DC | PRN

## 2015-04-06 MED ORDER — PANTOPRAZOLE SODIUM 40 MG PO TBEC
40.0000 mg | DELAYED_RELEASE_TABLET | Freq: Every day | ORAL | Status: DC
  Administered 2015-04-06: 40 mg via ORAL
  Filled 2015-04-06 (×2): qty 1

## 2015-04-06 MED ORDER — OXYCODONE HCL 5 MG PO TABS
5.0000 mg | ORAL_TABLET | ORAL | Status: DC | PRN
  Administered 2015-04-09: 5 mg via ORAL
  Filled 2015-04-06: qty 1

## 2015-04-06 MED ORDER — HYDROCORTISONE ACETATE 25 MG RE SUPP
25.0000 mg | Freq: Two times a day (BID) | RECTAL | Status: DC
Start: 2015-04-06 — End: 2015-04-10
  Filled 2015-04-06 (×10): qty 1

## 2015-04-06 NOTE — ED Provider Notes (Signed)
CSN: [id number]     Arrival date & time 04/06/15  4098 History   First MD Initiated Contact with Patient 04/06/15 [id number]     Chief Complaint  Patient presents with  . Chest Pain    rib pain  . Abdominal Pain  . Back Pain      HPI Patient states he was at work and thinks he twisted wrong causing increased pain in the rib area where he has a fractured rib. Patient also reports abdominal pain that radiates all around into the back. Patient states that he has ran out of Vicodin and Ibuprofen is working for his pain. Patient states he is constipated and has been straining.  Past Medical History  Diagnosis Date  . Tobacco abuse   . Coronary artery disease   . Anginal pain (HCC)   . GERD (gastroesophageal reflux disease)   . NSTEMI (non-ST elevated myocardial infarction) (HCC) 01/2014  . Pancreatitis    Past Surgical History  Procedure Laterality Date  . Coronary angioplasty with stent placement  01/18/2014    "1"  . Left heart catheterization with coronary angiogram N/A 01/18/2014    Procedure: LEFT HEART CATHETERIZATION WITH CORONARY ANGIOGRAM;  Surgeon: Runell Gess, MD;  Location: [facility] CATH LAB;  Service: Cardiovascular;  Laterality: N/A;   History reviewed. No pertinent family history. Social History  Substance Use Topics  . Smoking status: Current Every Day Smoker -- 1.00 packs/day for 31 years    Types: Cigarettes  . Smokeless tobacco: Never Used  . Alcohol Use: No    Review of Systems  Gastrointestinal: Positive for abdominal pain. Negative for vomiting, diarrhea and abdominal distention.  All other systems reviewed and are negative.     Allergies  Ibuprofen and Shellfish allergy  Home Medications   Prior to Admission medications   Medication Sig Start Date End Date Taking? Authorizing Provider  ibuprofen (ADVIL,MOTRIN) 800 MG tablet Take 1 tablet (800 mg total) by mouth 3 (three) times daily. 03/25/15  Yes Shawn C Joy, PA-C  omeprazole (PRILOSEC OTC) 20 MG tablet  Take 20 mg by mouth daily.   Yes Historical Provider, MD  HYDROcodone-acetaminophen (NORCO/VICODIN) 5-325 MG tablet Take 1 tab every 6-8 hours as needed for pain 03/28/15   Amy S Esterwood, PA-C  pantoprazole (PROTONIX) 40 MG tablet Take 1 tab by mouth every morning. 03/28/15   Amy S Esterwood, PA-C   BP 157/95 mmHg  Pulse 95  Resp 15  SpO2 95% Physical Exam  Constitutional: He is oriented to person, place, and time. He appears well-developed and well-nourished. No distress.  HENT:  Head: Normocephalic and atraumatic.  Eyes: Pupils are equal, round, and reactive to light.  Neck: Normal range of motion.  Cardiovascular: Normal rate and intact distal pulses.   Pulmonary/Chest: No respiratory distress.  Abdominal: Normal appearance. He exhibits no distension. There is tenderness.    Musculoskeletal: Normal range of motion.  Neurological: He is alert and oriented to person, place, and time. No cranial nerve deficit.  Skin: Skin is warm and dry. No rash noted.  Psychiatric: He has a normal mood and affect. His behavior is normal.  Nursing note and vitals reviewed.   ED Course  Procedures (including critical care time) Labs Review Labs Reviewed  LIPASE, BLOOD - Abnormal; Notable for the following:    Lipase 1159 (*)    All other components within normal limits  COMPREHENSIVE METABOLIC PANEL - Abnormal; Notable for the following:    Glucose, Bld 109 (*)  Calcium 8.8 (*)    Total Protein 6.4 (*)    All other components within normal limits  CBC WITH DIFFERENTIAL/PLATELET - Abnormal; Notable for the following:    MCV 100.4 (*)    All other components within normal limits  I-STAT TROPOININ, ED    Imaging Review Dg Abd Acute W/chest  04/06/2015  CLINICAL DATA:  Rib pain, twisted run at work, generalized abdominal pain radiating around to back, constipation, history coronary artery disease, smoking, GERD, fatty liver EXAM: DG ABDOMEN ACUTE W/ 1V CHEST COMPARISON:  Chest radiograph  03/24/2015 FINDINGS: Normal heart size, mediastinal contours, and pulmonary vascularity. Emphysematous and bronchitic changes question COPD. Calcified granuloma RIGHT upper lobe. No definite pulmonary infiltrate, pleural effusion, or pneumothorax. Bowel gas pattern normal. No bowel dilatation, bowel wall thickening or free intraperitoneal air. Osseous structures normal. No urinary tract calcification. IMPRESSION: COPD changes without acute infiltrate. Electronically Signed   By: Ulyses Southward M.D.   On: 04/06/2015 09:52   I have personally reviewed and evaluated these images and lab results as part of my medical decision-making.   EKG Interpretation   Date/Time:  Wednesday April 06 2015 10:10:19 EST Ventricular Rate:  89 PR Interval:  161 QRS Duration: 85 QT Interval:  379 QTC Calculation: 461 R Axis:   52 Text Interpretation:  Sinus rhythm Probable left atrial enlargement No  significant change since last tracing Confirmed by Karolyne Timmons  MD, Dorell Gatlin  ([id number]) on 04/06/2015 10:26:44 AM      MDM   Final diagnoses:  Other acute pancreatitis        Nelva Nay, MD 04/06/15 1136

## 2015-04-06 NOTE — H&P (Addendum)
History and Physical:    Douglas [name]   BJY:[id number] DOB: Nov 27, 1965 DOA: 04/06/2015  Referring MD/provider: Dr. Radford Pax PCP: No PCP Per Patient   Chief Complaint: Abdominal pain  History of Present Illness:   Douglas [name] is an 50 y.o. male with a PMH of CAD, tobacco abuse, pancreatitis with recent hospitalization 03/05/15-03/10/15 where a CT was performed which showed focal pancreatitis versus pancreatic mass lesion. He was seen by GI during that visit and recommended to have an outpatient EUS. The patient followed up with GI on 03/28/15 where he reportedly complained of ongoing abdominal pain and a 10-12 pound weight loss from the time of his hospitalization. An EUS was scheduled 04/21/15 with Dr. Christella Hartigan. He still does not have a PCP because he doesn't have the $90 co-pay needed to schedule the appointment.  He presents today with abdominal pain that began yesterday afternoon.  He tells me "ibuprofen doesn't touch it".  The pain radiates to his back and is sharp.  Pain is currently a 7/10, but was "11/10" before receiving pain medications in the ER.  He has had some associated nausea and vomiting, the last episode was yesterday.  He has had some loose stools, but feels like he has to "force himself to poop", and that he has had some hemorrhoidal bleeding.  Denies alcohol use, but states he did have "a couple of drinks" on 03/13/15. Upon initial evaluation in the ED, the patient was found to have significant elevation of his lipase.  ROS:   Review of Systems  Constitutional: Positive for weight loss and malaise/fatigue. Negative for fever, chills and diaphoresis.  HENT: Negative.   Eyes: Negative.   Respiratory: Positive for shortness of breath. Negative for cough, hemoptysis, sputum production and wheezing.        Associated with rib fracture, recent fall on the ice.  Cardiovascular: Negative for chest pain, palpitations, claudication and leg swelling.  Gastrointestinal: Positive  for heartburn, nausea, vomiting, abdominal pain, diarrhea, constipation and blood in stool. Negative for melena.  Genitourinary: Negative.   Musculoskeletal: Positive for falls.  Skin: Negative for itching and rash.  Neurological: Negative for dizziness, seizures, loss of consciousness, weakness and headaches.  Endo/Heme/Allergies: Bruises/bleeds easily.  Psychiatric/Behavioral: Positive for substance abuse. Negative for depression. The patient is not nervous/anxious.      Past Medical History:   Past Medical History  Diagnosis Date  . Tobacco abuse   . Coronary artery disease   . Anginal pain (HCC)   . GERD (gastroesophageal reflux disease)   . NSTEMI (non-ST elevated myocardial infarction) (HCC) 01/2014  . Pancreatitis   . PTSD (post-traumatic stress disorder)     Past Surgical History:   Past Surgical History  Procedure Laterality Date  . Coronary angioplasty with stent placement  01/18/2014    "1"  . Left heart catheterization with coronary angiogram N/A 01/18/2014    Procedure: LEFT HEART CATHETERIZATION WITH CORONARY ANGIOGRAM;  Surgeon: Runell Gess, MD;  Location: [facility] CATH LAB;  Service: Cardiovascular;  Laterality: N/A;    Social History:   Social History   Social History  . Marital Status: Divorced    Spouse Name: N/A  . Number of Children: 2  . Years of Education: N/A   Occupational History  . [employer]    Social History Main Topics  . Smoking status: Current Every Day Smoker -- 1.00 packs/day for 31 years    Types: Cigarettes  . Smokeless tobacco: Never Used  . Alcohol Use: No  Comment: Last use 03/13/15  . Drug Use: No  . Sexual Activity: Not Currently   Other Topics Concern  . Not on file   Social History Narrative   Divorced.  2 sons.  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    Family history:   Family History  Problem Relation Age of Onset  . Cancer Mother     Lung cancer  . Coronary artery disease Mother    . Heart failure Maternal Grandmother     Allergies   Ibuprofen and Shellfish allergy  Current Medications:   Prior to Admission medications   Medication Sig Start Date End Date Taking? Authorizing Provider  ibuprofen (ADVIL,MOTRIN) 800 MG tablet Take 1 tablet (800 mg total) by mouth 3 (three) times daily. 03/25/15  Yes Shawn C Joy, PA-C  omeprazole (PRILOSEC OTC) 20 MG tablet Take 20 mg by mouth daily.   Yes Historical Provider, MD  HYDROcodone-acetaminophen (NORCO/VICODIN) 5-325 MG tablet Take 1 tab every 6-8 hours as needed for pain 03/28/15   Amy S Esterwood, PA-C  pantoprazole (PROTONIX) 40 MG tablet Take 1 tab by mouth every morning. 03/28/15   Sammuel Cooper, PA-C    Physical Exam:   Filed Vitals:   04/06/15 1030 04/06/15 1100 04/06/15 1114 04/06/15 1130  BP: 166/102 157/95 157/95 157/96  Pulse: 87 99 95 97  TempSrc:      Resp: SpO2: 98% 94% 95% 96%     Physical Exam: Blood pressure 157/96, pulse 97, resp. rate 17, SpO2 96 %. Gen: No acute distress. Head: Normocephalic, atraumatic. Eyes: PERRL, EOMI, sclerae nonicteric but injected bilaterally with conjunctival erythema. Mouth: Oropharynx clear, fair dentition, dry mucous membranes. Neck: Supple, no thyromegaly, no lymphadenopathy, no jugular venous distention. Chest: Lungs CTAB. CV: Heart sounds are clear.   Abdomen: Soft, nontender, nondistended with normal active bowel sounds. Extremities: Extremities are without C/E/C. Skin: Warm and dry. Neuro: Alert and oriented times 3; cranial nerves II through XII grossly intact. Psych: Mood and affect normal.   Data Review:    Labs: Basic Metabolic Panel:  Recent Labs Lab 04/06/15 0931  NA 143  K 4.1  CL 108  CO2 26  GLUCOSE 109*  BUN 8  CREATININE 1.04  CALCIUM 8.8*   Liver Function Tests:  Recent Labs Lab 04/06/15 0931  AST 30  ALT 21  ALKPHOS 123  BILITOT 0.4  PROT 6.4*  ALBUMIN 3.8    Recent Labs Lab 04/06/15 0931  LIPASE  1159*   CBC:  Recent Labs Lab 04/06/15 0931  WBC 6.8  NEUTROABS 4.1  HGB 15.4  HCT 46.2  MCV 100.4*  PLT 271    Radiographic Studies: Dg Abd Acute W/chest  04/06/2015  CLINICAL DATA:  Rib pain, twisted run at work, generalized abdominal pain radiating around to back, constipation, history coronary artery disease, smoking, GERD, fatty liver EXAM: DG ABDOMEN ACUTE W/ 1V CHEST COMPARISON:  Chest radiograph 03/24/2015 FINDINGS: Normal heart size, mediastinal contours, and pulmonary vascularity. Emphysematous and bronchitic changes question COPD. Calcified granuloma RIGHT upper lobe. No definite pulmonary infiltrate, pleural effusion, or pneumothorax. Bowel gas pattern normal. No bowel dilatation, bowel wall thickening or free intraperitoneal air. Osseous structures normal. No urinary tract calcification. IMPRESSION: COPD changes without acute infiltrate. Electronically Signed   By: Ulyses Southward M.D.   On: 04/06/2015 09:52   *I have personally reviewed the images above*  EKG: Independently reviewed. NSR at 89 bpm, no ischemic  changes.   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 mass. Lipase is: 1159.  LFTs are: WNL.  - Source of pancreatitis felt to be from: alcohol versus mass. - Disease severity is: Mild (no organ failure/local complications). - Monitor BUN, hemoglobin, creatinine (elevations show hypovolemia). - Monitor WBC and fluid volume shifts (indicate inflammation). - Monitor creatinine, LFTs, and oxygenation status (indicates organ damage). - Monitor calcium (low calcium indicative of fat necrosis/saponification---end organ damage & hypovolemia). Calcium 8.8. - Consider CT if no improvement in 48-72 hours. Will need EUS to rule out occult malignancy or microlithiasis. - IVF with Lactated Ringers which decreases SIRS and organ  failure risk by up to 80% compared to NS. - Monitor urine output.  Aim for > 0.5to 1 mL/kg/hour. - Bowel rest until pain improved.  Advance to low fat when able to tolerate. - Monitor for intraabdominal compartment syndrome if aggressive IVF needed. - Monitor for complications: Glucose intolerance (check CBGs q am), DM, thrombosis of the portal vein, splenic aneurysm.  Active Problems:  Rectal bleeding secondary to hemorrhoids - Anusol HC BID. Hemoglobin stable.  DVT prophylaxis - SCDs ordered.  Code Status / Family Communication / Disposition Plan:   Code Status: Full. Family Communication: No family at the bedside. Disposition Plan: Home when stable, likely 3 days, lives with a roommate.  Attestation regarding necessity of inpatient status:   The appropriate admission status for this patient is INPATIENT. Inpatient status is judged to be reasonable and necessary in order to provide the required intensity of service to ensure the patient's safety. The patient's presenting symptoms, physical exam findings, and initial radiographic and laboratory data in the context of their chronic comorbidities is felt to place them at high risk for further clinical deterioration. Furthermore, it is not anticipated that the patient will be medically stable for discharge from the hospital within 2 midnights of admission. The following factors support the admission status of inpatient.   -The patient's presenting symptoms include abdominal pain, nausea/vomiting. - The worrisome physical exam findings include Mild tachycardia, dry mucous membranes. - The initial radiographic and laboratory data are worrisome because of elevated lipase. - The chronic co-morbidities include CAD, prior pancreatitis. - Patient requires inpatient status due to high intensity of service, high risk for further deterioration and high frequency of surveillance required. - I certify that at the point of admission it is my clinical  judgment that the patient will require inpatient hospital care spanning beyond 2 midnights from the point of admission.   Time spent: 1 hour.  RAMA,CHRISTINA [facility] Pager [phone] Cell: [phone]   If 7PM-7AM, please contact night-coverage [email or web address] Password [email or web address] 04/06/2015, 12:01 PM

## 2015-04-06 NOTE — ED Notes (Signed)
Patient transported to X-ray 

## 2015-04-06 NOTE — Consult Note (Signed)
Referring Provider: No ref. provider found Primary Care Physician:  No PCP Per Patient Primary Gastroenterologist:  Dr. Adela Lank  Reason for Consultation:  Pancreatitis  HPI: Douglas [name] is a 50 y.o. male new to GI when seen in consultation during recent hospitalization with acute pancreatitis.  Patient was diagnosed with "groove" pancreatitis With MRI/MRCPon 03/05/2015 showing inflammatory changes resulting in mass effect upon the distal common bile duct which is markedly narrowed shortly before the ampulla could not rule out possibility of an underlying mass also noted type I pancreas divisum anatomy. At the time of admission patient had been having upper abdominal pain and nausea for several weeks. He was drinking alcohol on a regular basis up to the time of admission. It was felt that his pancreatitis was alcohol-induced. He was also complaining of diarrhea. Pancreatic fecal elastase was checked and this was normal. We discussed possible colonoscopy as an outpatient.  At the time of discharge plan was for him to have repeat MRI MRCP in about 3 months and also to be scheduled for EUS with Dr. Christella Hartigan. We discussed colonoscopy at some point once he was over the acute pancreatitis.  He was seen back in our office for follow-up on 1/16 stating that he was having ongoing abdominal pain since discharge from the hospital. He said he lost his 1 prescription for Vicodin on the bus. He had also been back to the emergency room at least once for abdominal pain and on the 12th was seen in the ER after falling on the ice and sustained a nondisplaced rib fracture on the left.  He stated that he had lost about 10-12 pounds from the time of hospitalization. He had some nausea and intermittent vomiting and is not sure what he should eat because most solid food is making his pain consistently worse. He is also been taking 800 mg ibuprofen over the past few days for the rib fracture. Patient did return to  work on December 29.   Was admitted again to [facility] hospital today with acute pancreatitis with lipase of 1159.  Says that pain is 7/10 but appears quite comfortable currently.  He admits to having two 12 ounce beers on Saturday and then started having pain on Monday.     Past Medical History  Diagnosis Date  . Tobacco abuse   . Coronary artery disease   . Anginal pain (HCC)   . GERD (gastroesophageal reflux disease)   . NSTEMI (non-ST elevated myocardial infarction) (HCC) 01/2014  . Pancreatitis   . PTSD (post-traumatic stress disorder)     Past Surgical History  Procedure Laterality Date  . Coronary angioplasty with stent placement  01/18/2014    "1"  . Left heart catheterization with coronary angiogram N/A 01/18/2014    Procedure: LEFT HEART CATHETERIZATION WITH CORONARY ANGIOGRAM;  Surgeon: Runell Gess, MD;  Location: [facility] CATH LAB;  Service: Cardiovascular;  Laterality: N/A;    Prior to Admission medications   Medication Sig Start Date End Date Taking? Authorizing Provider  ibuprofen (ADVIL,MOTRIN) 800 MG tablet Take 1 tablet (800 mg total) by mouth 3 (three) times daily. 03/25/15  Yes Shawn C Joy, PA-C  omeprazole (PRILOSEC OTC) 20 MG tablet Take 20 mg by mouth daily.   Yes Historical Provider, MD  HYDROcodone-acetaminophen (NORCO/VICODIN) 5-325 MG tablet Take 1 tab every 6-8 hours as needed for pain 03/28/15   Amy S Esterwood, PA-C  pantoprazole (PROTONIX) 40 MG tablet Take 1 tab by mouth every morning. 03/28/15   Amy  S Esterwood, PA-C    Current Facility-Administered Medications  Medication Dose Route Frequency Provider Last Rate Last Dose  . acetaminophen (TYLENOL) tablet 650 mg  650 mg Oral Q6H PRN Maryruth Bun Rama, MD       Or  . acetaminophen (TYLENOL) suppository 650 mg  650 mg Rectal Q6H PRN Maryruth Bun Rama, MD      . hydrocortisone (ANUSOL-HC) suppository 25 mg  25 mg Rectal BID Christina P Rama, MD      . HYDROmorphone (DILAUDID) injection 1 mg  1 mg Intravenous Q3H  PRN Maryruth Bun Rama, MD   1 mg at 04/06/15 1259  . lactated ringers infusion   Intravenous Continuous Maryruth Bun Rama, MD 125 mL/hr at 04/06/15 1300    . ondansetron (ZOFRAN) tablet 4 mg  4 mg Oral Q6H PRN Christina P Rama, MD       Or  . ondansetron (ZOFRAN) injection 4 mg  4 mg Intravenous Q6H PRN Christina P Rama, MD      . oxyCODONE (Oxy IR/ROXICODONE) immediate release tablet 5 mg  5 mg Oral Q4H PRN Christina P Rama, MD      . pantoprazole (PROTONIX) EC tablet 40 mg  40 mg Oral Daily Maryruth Bun Rama, MD        Allergies as of 04/06/2015 - Review Complete 04/06/2015  Allergen Reaction Noted  . Ibuprofen Other (See Comments) 04/06/2015  . Shellfish allergy Nausea And Vomiting 04/06/2015    Family History  Problem Relation Age of Onset  . Cancer Mother     Lung cancer  . Coronary artery disease Mother   . Heart failure Maternal Grandmother     Social History   Social History  . Marital Status: Divorced    Spouse Name: N/A  . Number of Children: 2  . Years of Education: N/A   Occupational History  . [employer]    Social History Main Topics  . Smoking status: Current Every Day Smoker -- 1.00 packs/day for 31 years    Types: Cigarettes  . Smokeless tobacco: Never Used  . Alcohol Use: No     Comment: Last use 03/13/15  . Drug Use: No  . Sexual Activity: Not Currently   Other Topics Concern  . Not on file   Social History Narrative   Divorced.  2 sons.  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    Review of Systems: Ten point ROS is O/W negative except as mentioned in HPI.  Physical Exam: Vital signs in last 24 hours: Temp:  [98.5 F (36.9 C)] 98.5 F (36.9 C) (01/25 1230) Pulse Rate:  [85-106] 85 (01/25 1230) Resp:  [13-22] 16 (01/25 1230) BP: (157-185)/(95-111) 185/111 mmHg (01/25 1230) SpO2:  [94 %-100 %] 98 % (01/25 1230) Weight:  [143 lb 11.8 oz (65.2 kg)] 143 lb 11.8 oz (65.2 kg) (01/25 1230) Last BM Date: 04/06/15 General:   Alert, Well-developed, well-nourished, pleasant and cooperative in NAD Head:  Normocephalic and atraumatic. Eyes:  Sclera clear, no icterus.   Conjunctiva pink. Ears:  Normal auditory acuity. Mouth:  No deformity or lesions.   Lungs:  Some inspiratory wheezing noted. Heart:  Regular rate and rhythm; no murmurs, clicks, rubs, or gallops. Abdomen:  Soft, non-distended.  BS present.  Mild epigastric TTP.  Rectal:  Deferred  Msk:  Symmetrical without gross deformities. Pulses:  Normal pulses noted. Extremities:  Without clubbing or edema. Neurologic:  Alert and  oriented x4;  grossly normal neurologically.  Skin:  Intact without significant lesions or rashes. Psych:  Alert and cooperative. Normal mood and affect.  Lab Results:  Recent Labs  04/06/15 0931  WBC 6.8  HGB 15.4  HCT 46.2  PLT 271   BMET  Recent Labs  04/06/15 0931  NA 143  K 4.1  CL 108  CO2 26  GLUCOSE 109*  BUN 8  CREATININE 1.04  CALCIUM 8.8*   LFT  Recent Labs  04/06/15 0931  PROT 6.4*  ALBUMIN 3.8  AST 30  ALT 21  ALKPHOS 123  BILITOT 0.4    Studies/Results: Dg Abd Acute W/chest  04/06/2015  CLINICAL DATA:  Rib pain, twisted run at work, generalized abdominal pain radiating around to back, constipation, history coronary artery disease, smoking, GERD, fatty liver EXAM: DG ABDOMEN ACUTE W/ 1V CHEST COMPARISON:  Chest radiograph 03/24/2015 FINDINGS: Normal heart size, mediastinal contours, and pulmonary vascularity. Emphysematous and bronchitic changes question COPD. Calcified granuloma RIGHT upper lobe. No definite pulmonary infiltrate, pleural effusion, or pneumothorax. Bowel gas pattern normal. No bowel dilatation, bowel wall thickening or free intraperitoneal air. Osseous structures normal. No urinary tract calcification. IMPRESSION: COPD changes without acute infiltrate. Electronically Signed   By: Ulyses Southward M.D.   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  Drank ETOH on 1/21.  PLAN: -Plan for now is supportive care for acute pancreatitis with IVF's (is on LR 125 mL/hour), NPO status (ok for ice chips), anti-emetics, and pain control. -He is scheduled for EUS on 2/9, but this will likely need to be post-poned since ideally he needs to be well recovered from acute pancreatitis first. -Needs to completely abstain from ETOH to avoid future episodes between now and then, and altogether.  ZEHR, JESSICA D.  04/06/2015, 2:27 PM  Pager number [phone]     Attending physician's note   I have taken a history, examined the patient and reviewed the chart. I agree with the Advanced Practitioner's note, impression and recommendations. Pt readmitted with acute on chronic pancreatitis. Pancreas divisum noted on MRCP in 02/2015. Prior episode of presumed groove pancreatitis in Dec 2016. EUS scheduled for 2/9 and repeat MRI/MRCP schedule for 3/13. EUS will not be helpful to exclude an underlying pancreatic mass in the setting of acute pancreatitis so will need to wait until pancreatitis has fully resolved. Advised to completely avoid all alcohol. He is interested in support mechanisms to help to avoid alcohol use. Standard mgmt for pancreatitis: NPO except ice chips, pain control, emesis control, IV hydration and trend CMP, CBC, lipase.   Claudette Head, MD [facility] [phone] Mon-Fri 8a-5p [phone] after 5p, weekends, holidays

## 2015-04-06 NOTE — ED Notes (Signed)
Pt can go to floor at 12:20

## 2015-04-06 NOTE — ED Notes (Signed)
Patient states he was at work and thinks he twisted wrong causing increased pain in the rib area where he has a fractured rib. Patient also reports abdominal pain that radiates all around into the back. Patient states that he has ran out of Vicodin and Ibuprofen is working for his pain. Patient states he is constipated and has been straining. Patient reports that he has hemorrhoids and has some bleeding when he strains.

## 2015-04-07 ENCOUNTER — Telehealth: Payer: Self-pay

## 2015-04-07 DIAGNOSIS — I10 Essential (primary) hypertension: Secondary | ICD-10-CM

## 2015-04-07 DIAGNOSIS — K852 Alcohol induced acute pancreatitis without necrosis or infection: Secondary | ICD-10-CM

## 2015-04-07 DIAGNOSIS — Z720 Tobacco use: Secondary | ICD-10-CM

## 2015-04-07 DIAGNOSIS — F1010 Alcohol abuse, uncomplicated: Secondary | ICD-10-CM

## 2015-04-07 LAB — CBC
HCT: 44.1 % (ref 39.0–52.0)
Hemoglobin: 14.1 g/dL (ref 13.0–17.0)
MCH: 32.4 pg (ref 26.0–34.0)
MCHC: 32 g/dL (ref 30.0–36.0)
MCV: 101.4 fL — AB (ref 78.0–100.0)
PLATELETS: 227 10*3/uL (ref 150–400)
RBC: 4.35 MIL/uL (ref 4.22–5.81)
RDW: 13.5 % (ref 11.5–15.5)
WBC: 7.3 10*3/uL (ref 4.0–10.5)

## 2015-04-07 LAB — BASIC METABOLIC PANEL
Anion gap: 7 (ref 5–15)
BUN: 8 mg/dL (ref 6–20)
CO2: 28 mmol/L (ref 22–32)
CREATININE: 0.92 mg/dL (ref 0.61–1.24)
Calcium: 8.6 mg/dL — ABNORMAL LOW (ref 8.9–10.3)
Chloride: 104 mmol/L (ref 101–111)
GFR calc Af Amer: >60 mL/min (ref >60)
GLUCOSE: 86 mg/dL (ref 65–99)
Potassium: 4.2 mmol/L (ref 3.5–5.1)
SODIUM: 139 mmol/L (ref 135–145)

## 2015-04-07 LAB — LIPASE, BLOOD: Lipase: 1388 U/L — ABNORMAL HIGH (ref 11–51)

## 2015-04-07 LAB — RAPID URINE DRUG SCREEN, HOSP PERFORMED
AMPHETAMINES: NOT DETECTED
BARBITURATES: NOT DETECTED
Benzodiazepines: NOT DETECTED
Cocaine: NOT DETECTED
Opiates: POSITIVE — AB
TETRAHYDROCANNABINOL: NOT DETECTED

## 2015-04-07 LAB — GLUCOSE, CAPILLARY: GLUCOSE-CAPILLARY: 92 mg/dL (ref 65–99)

## 2015-04-07 MED ORDER — HYDRALAZINE HCL 20 MG/ML IJ SOLN
10.0000 mg | Freq: Once | INTRAMUSCULAR | Status: AC
  Administered 2015-04-07: 10 mg via INTRAVENOUS
  Filled 2015-04-07: qty 1

## 2015-04-07 MED ORDER — METOPROLOL TARTRATE 1 MG/ML IV SOLN
2.5000 mg | Freq: Three times a day (TID) | INTRAVENOUS | Status: DC
  Administered 2015-04-07 – 2015-04-10 (×9): 2.5 mg via INTRAVENOUS
  Filled 2015-04-07 (×9): qty 5

## 2015-04-07 MED ORDER — PANTOPRAZOLE SODIUM 40 MG IV SOLR
40.0000 mg | INTRAVENOUS | Status: DC
  Administered 2015-04-07 – 2015-04-10 (×4): 40 mg via INTRAVENOUS
  Filled 2015-04-07 (×5): qty 40

## 2015-04-07 NOTE — Telephone Encounter (Signed)
Pt has been rescheduled to 05/12/15 830 am Doug Sou notified and instructions mailed to the home

## 2015-04-07 NOTE — Progress Notes (Signed)
Patient with elevated BP 188/95. Paged NP and order received for hydralazine x1.

## 2015-04-07 NOTE — Progress Notes (Signed)
PROGRESS NOTE  Nima Kemppainen RUE:[id number] DOB: 1966-02-01 DOA: 04/06/2015 PCP: No PCP Per Patient  HPI/Recap of past 24 hours:  C/o ab pain, vomited x1, denies hematemesis.   Assessment/Plan: Principal Problem:   Pancreatitis, acute Active Problems:   Abdominal pain   Acute pancreatitis   Rectal bleeding   Hypocalcemia   Acute on chronic pancreatitis:  likely related to alcohol. Lipid panel pending. No gallstone per prior CT and MRI ab.  MRI from 03/07/15 showed pancreatitis with ? mass. Lipase is: 1159. LFTs are: WNL. Monitor calcium/glucose level. GI input appreciated, recommends conservative management wit npo/ivf/suuportive care, need to reschedule EUS when acute pancreatitis resolves.  Pancreatic mass?  Divisum? Groove pancreatitis? need to reschedule EUS 2/9 when acute pancreatitis resolves,  Repeat MRI/MRCP on 3/13, GI following  Rectal bleed due to hemorrhoids: Anusol, h/h stable, avoid constipation.  CAD s/p stent 01/2014, patient has not been compliant with meds and has not followed with cardiology. Advised patient to follow up with cardiology closely.  HTN; has not been on meds consistently, was recently on betablocker, on iv lopressor and prn hydralazine for bp control for now due to npo status.  Cigarette smoking: cessation education provided, no nicotine patch  Alcohol use: abstinence education provided, on withdrawal symptom for now.  Macrocytosis: likely related to alcohol, check tsh/b12/folate.   Code Status: full  Family Communication: patient   Disposition Plan: home when able to tolerate diet   Consultants:  GI  Procedures: none Antibiotics:  none   Objective: BP 165/91 mmHg  Pulse 81  Temp(Src) 98 F (36.7 C) (Oral)  Resp 16  Ht  (1.803 m)  Wt 65.2 kg (143 lb 11.8 oz)  BMI 20.06 kg/m2  SpO2 97%  Intake/Output Summary (Last 24 hours) at 04/07/15 0959 Last data filed at 04/07/15 1478  Gross per 24 hour  Intake    2161 ml  Output    880 ml  Net   1281 ml   Filed Weights   04/06/15 1230  Weight: 65.2 kg (143 lb 11.8 oz)    Exam:   General:  NAD  Cardiovascular: RRR  Respiratory: CTABL  Abdomen: mild tender, no guarding , no rebound, Soft/NT, positive BS  Musculoskeletal: No Edema  Neuro: aaox3  Data Reviewed: Basic Metabolic Panel:  Recent Labs Lab 04/06/15 0931 04/07/15 0413  NA 143 139  K 4.1 4.2  CL 108 104  CO2 26 28  GLUCOSE 109* 86  BUN 8 8  CREATININE 1.04 0.92  CALCIUM 8.8* 8.6*   Liver Function Tests:  Recent Labs Lab 04/06/15 0931  AST 30  ALT 21  ALKPHOS 123  BILITOT 0.4  PROT 6.4*  ALBUMIN 3.8    Recent Labs Lab 04/06/15 0931  LIPASE 1159*   No results for input(s): AMMONIA in the last 168 hours. CBC:  Recent Labs Lab 04/06/15 0931 04/07/15 0413  WBC 6.8 7.3  NEUTROABS 4.1  --   HGB 15.4 14.1  HCT 46.2 44.1  MCV 100.4* 101.4*  PLT 271 227   Cardiac Enzymes:   No results for input(s): CKTOTAL, CKMB, CKMBINDEX, TROPONINI in the last 168 hours. BNP (last 3 results) No results for input(s): BNP in the last 8760 hours.  ProBNP (last 3 results) No results for input(s): PROBNP in the last 8760 hours.  CBG:  Recent Labs Lab 04/07/15 0740  GLUCAP 92    No results found for this or any previous visit (from the past 240 hour(s)).   Studies:  No results found.  Scheduled Meds: . hydrocortisone  25 mg Rectal BID  . nicotine  21 mg Transdermal Daily  . pantoprazole  40 mg Oral Daily    Continuous Infusions: . lactated ringers 1,000 mL (04/07/15 0459)     Time spent:  Shilo Pauwels MD, PhD  [facility] Pager [phone]. If 7PM-7AM, please contact night-coverage at [email or web address], password [facility] 04/07/2015, 9:59 AM  LOS: 1 day

## 2015-04-07 NOTE — Telephone Encounter (Signed)
-----   Message from Rachael Fee, MD sent at 04/06/2015  8:02 PM EST ----- Really should postpone his EUS for at least a month after a bout of acute pancreatitis.  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  He is scheduled for EUS on 2/9 I believe.  Wanted to make you aware and see if you would like to post-pone the procedure.  He continues to drink ETOH.  Thank you,  Jess

## 2015-04-07 NOTE — Progress Notes (Signed)
     [facility] Progress Note  Subjective:  Lipase actually increased today to 1388.  He just received pain meds and says that they are working well; he is sitting comfortably in bed working on laptop.  Complains of headache so is waiting for some tylenol.  Objective:  Vital signs in last 24 hours: Temp:  [98 F (36.7 C)-98.5 F (36.9 C)] 98 F (36.7 C) (01/26 0451) Pulse Rate:  [78-99] 81 (01/26 0625) Resp:  [13-22] 16 (01/26 0451) BP: (157-188)/(91-111) 165/91 mmHg (01/26 0625) SpO2:  [94 %-98 %] 97 % (01/26 0451) Weight:  [143 lb 11.8 oz (65.2 kg)] 143 lb 11.8 oz (65.2 kg) (01/25 1230) Last BM Date: 04/06/15 ("little pieces per pt") General:  Alert, Well-developed, in NAD Heart:  Regular rate and rhythm; no murmurs Pulm:  CTAB.  No W/R/R. Abdomen:  Soft, non-distended.  BS present.  Minimal epigastric TTP.   Extremities:  Without edema. Neurologic:  Alert and oriented x 4;  grossly normal neurologically. Psych:  Alert and cooperative. Normal mood and affect.  Intake/Output from previous day: 01/25 0701 - 01/26 0700 In: 2161 [P.O.:160; I.V.:2001] Out: 880 [Urine:880]  Lab Results:  Recent Labs  04/06/15 0931 04/07/15 0413  WBC 6.8 7.3  HGB 15.4 14.1  HCT 46.2 44.1  PLT 271 227   BMET  Recent Labs  04/06/15 0931 04/07/15 0413  NA 143 139  K 4.1 4.2  CL 108 104  CO2 26 28  GLUCOSE 109* 86  BUN 8 8  CREATININE 1.04 0.92  CALCIUM 8.8* 8.6*   LFT  Recent Labs  04/06/15 0931  PROT 6.4*  ALBUMIN 3.8  AST 30  ALT 21  ALKPHOS 123  BILITOT 0.4   Dg Abd Acute W/chest  04/06/2015  CLINICAL DATA:  Rib pain, twisted run at work, generalized abdominal pain radiating around to back, constipation, history coronary artery disease, smoking, GERD, fatty liver EXAM: DG ABDOMEN ACUTE W/ 1V CHEST COMPARISON:  Chest radiograph 03/24/2015 FINDINGS: Normal heart size, mediastinal contours, and pulmonary vascularity. Emphysematous and bronchitic changes  question COPD. Calcified granuloma RIGHT upper lobe. No definite pulmonary infiltrate, pleural effusion, or pneumothorax. Bowel gas pattern normal. No bowel dilatation, bowel wall thickening or free intraperitoneal air. Osseous structures normal. No urinary tract calcification. IMPRESSION: COPD changes without acute infiltrate. Electronically Signed   By: Ulyses Southward M.D.   On: 04/06/2015 09:52    Assessment / Plan: -Acute on chronic pancreatitis secondary to etoh use, "groove" pancreatitis at last admission. Drank ETOH on 1/21.  -Plan for now is standard supportive care for acute pancreatitis with IVF's (is on LR 125 mL/hour), NPO status (ok for ice chips), anti-emetics, and pain control. -We have rescheduled his EUS for March 2 per Dr. Christella Hartigan. -Needs to completely abstain from ETOH to avoid future episodes between now and then, and altogether.   LOS: 1 day   ZEHR, JESSICA D.  04/07/2015, 9:29 AM  Pager number [phone]    Attending physician's note   I have taken an interval history, reviewed the chart and examined the patient. I agree with the Advanced Practitioner's note, impression and recommendations.   Claudette Head, MD [facility] [phone] Mon-Fri 8a-5p [phone] after 5p, weekends, holidays

## 2015-04-08 DIAGNOSIS — F17200 Nicotine dependence, unspecified, uncomplicated: Secondary | ICD-10-CM | POA: Insufficient documentation

## 2015-04-08 DIAGNOSIS — K859 Acute pancreatitis without necrosis or infection, unspecified: Principal | ICD-10-CM

## 2015-04-08 LAB — COMPREHENSIVE METABOLIC PANEL
ALBUMIN: 3.2 g/dL — AB (ref 3.5–5.0)
ALK PHOS: 106 U/L (ref 38–126)
ALT: 18 U/L (ref 17–63)
AST: 27 U/L (ref 15–41)
Anion gap: 11 (ref 5–15)
BILIRUBIN TOTAL: 1.1 mg/dL (ref 0.3–1.2)
BUN: 10 mg/dL (ref 6–20)
CALCIUM: 8.3 mg/dL — AB (ref 8.9–10.3)
CO2: 24 mmol/L (ref 22–32)
CREATININE: 0.79 mg/dL (ref 0.61–1.24)
Chloride: 101 mmol/L (ref 101–111)
GFR calc Af Amer: >60 mL/min (ref >60)
GLUCOSE: 68 mg/dL (ref 65–99)
Potassium: 3.8 mmol/L (ref 3.5–5.1)
Sodium: 136 mmol/L (ref 135–145)
TOTAL PROTEIN: 5.7 g/dL — AB (ref 6.5–8.1)

## 2015-04-08 LAB — LIPID PANEL
Cholesterol: 121 mg/dL (ref 0–200)
HDL: 35 mg/dL — AB (ref >40)
LDL CALC: 71 mg/dL (ref 0–99)
Total CHOL/HDL Ratio: 3.5 RATIO
Triglycerides: 75 mg/dL (ref <150)
VLDL: 15 mg/dL (ref 0–40)

## 2015-04-08 LAB — CBC
HCT: 43.9 % (ref 39.0–52.0)
HEMOGLOBIN: 14.6 g/dL (ref 13.0–17.0)
MCH: 33.3 pg (ref 26.0–34.0)
MCHC: 33.3 g/dL (ref 30.0–36.0)
MCV: 100 fL (ref 78.0–100.0)
PLATELETS: 251 10*3/uL (ref 150–400)
RBC: 4.39 MIL/uL (ref 4.22–5.81)
RDW: 13.1 % (ref 11.5–15.5)
WBC: 10 10*3/uL (ref 4.0–10.5)

## 2015-04-08 LAB — GLUCOSE, CAPILLARY
GLUCOSE-CAPILLARY: 59 mg/dL — AB (ref 65–99)
GLUCOSE-CAPILLARY: 90 mg/dL (ref 65–99)

## 2015-04-08 LAB — VITAMIN B12: VITAMIN B 12: 293 pg/mL (ref 180–914)

## 2015-04-08 LAB — TSH: TSH: 1.907 u[IU]/mL (ref 0.350–4.500)

## 2015-04-08 LAB — LIPASE, BLOOD: LIPASE: 997 U/L — AB (ref 11–51)

## 2015-04-08 MED ORDER — DEXTROSE 50 % IV SOLN
INTRAVENOUS | Status: AC
  Administered 2015-04-08: 50 mL via INTRAVENOUS
  Filled 2015-04-08: qty 50

## 2015-04-08 MED ORDER — DEXTROSE 50 % IV SOLN
1.0000 | Freq: Once | INTRAVENOUS | Status: AC
  Administered 2015-04-08: 50 mL via INTRAVENOUS

## 2015-04-08 NOTE — Progress Notes (Signed)
Hypoglycemic Event  CBG: 59 Treatment: D50 IV 50 mL  Symptoms: None  Follow-up CBG: [id number] CBG Result:90  Possible Reasons for Event: Unknown  Comments/MD notified:NP ordered D50    Douglas [name]

## 2015-04-08 NOTE — Plan of Care (Signed)
Problem: Pain Managment: Goal: General experience of comfort will improve Outcome: Progressing Patient reporting pain decreasing after prn pain medications given. Pain in r abdomen.

## 2015-04-08 NOTE — Progress Notes (Signed)
PROGRESS NOTE  Douglas [name] ZOX:[id number] DOB: 03-Dec-1965 DOA: 04/06/2015 PCP: No PCP Per Patient  HPI/Recap of past 24 hours:  Feeling better, less pain, has clears today  Assessment/Plan: Principal Problem:   Pancreatitis, acute Active Problems:   Abdominal pain   Acute pancreatitis   Rectal bleeding   Hypocalcemia   Acute on chronic pancreatitis:  likely related to alcohol. Lipid panel pending. No gallstone per prior CT and MRI ab.  MRI from 03/07/15 showed pancreatitis with ? mass. Lipase is: 1159. LFTs are: WNL. Monitor calcium/glucose level. GI input appreciated, recommends conservative management wit npo/ivf/suuportive care, need to reschedule EUS when acute pancreatitis resolves. Better, diet advancement per GI.  Pancreatic mass?  Divisum? Groove pancreatitis? EUS rescheduled for 3/2 per GI when acute pancreatitis resolves,  Repeat MRI/MRCP on 3/13, GI following  Rectal bleed due to hemorrhoids: Anusol, h/h stable, avoid constipation.  CAD s/p stent 01/2014, patient has not been compliant with meds and has not followed with cardiology. Advised patient to follow up with cardiology closely.  HTN; has not been on meds consistently, was recently on betablocker, on iv lopressor and prn hydralazine for bp control for now due to npo status.  Cigarette smoking: cessation education provided, no nicotine patch  Alcohol use: abstinence education provided, on withdrawal symptom for now.  Macrocytosis: likely related to alcohol, tsh/b12/folate wnl.   Code Status: full  Family Communication: patient   Disposition Plan: home when able to tolerate diet   Consultants:  GI  Procedures: none Antibiotics:  none   Objective: BP 159/103 mmHg  Pulse 90  Temp(Src) 98.6 F (37 C) (Oral)  Resp 18  Ht  (1.803 m)  Wt 65.2 kg (143 lb 11.8 oz)  BMI 20.06 kg/m2  SpO2 99%  Intake/Output Summary (Last 24 hours) at 04/08/15 1810 Last data filed at 04/08/15  1410  Gross per 24 hour  Intake   3175 ml  Output   1390 ml  Net   1785 ml   Filed Weights   04/06/15 1230  Weight: 65.2 kg (143 lb 11.8 oz)    Exam:   General:  NAD  Cardiovascular: RRR  Respiratory: CTABL  Abdomen: mild tender, no guarding , no rebound, Soft/NT, positive BS  Musculoskeletal: No Edema  Neuro: aaox3  Data Reviewed: Basic Metabolic Panel:  Recent Labs Lab 04/06/15 0931 04/07/15 0413 04/08/15 0350  NA 143 139 136  K 4.1 4.2 3.8  CL 108 104 101  CO2 GLUCOSE 109* 86 68  BUN CREATININE 1.04 0.92 0.79  CALCIUM 8.8* 8.6* 8.3*   Liver Function Tests:  Recent Labs Lab 04/06/15 0931 04/08/15 0350  AST 30 27  ALT 21 18  ALKPHOS 123 106  BILITOT 0.4 1.1  PROT 6.4* 5.7*  ALBUMIN 3.8 3.2*    Recent Labs Lab 04/06/15 0931 04/07/15 0413 04/08/15 0350  LIPASE 1159* 1388* 997*   No results for input(s): AMMONIA in the last 168 hours. CBC:  Recent Labs Lab 04/06/15 0931 04/07/15 0413 04/08/15 0933  WBC 6.8 7.3 10.0  NEUTROABS 4.1  --   --   HGB 15.4 14.1 14.6  HCT 46.2 44.1 43.9  MCV 100.4* 101.4* 100.0  PLT 271 227 251   Cardiac Enzymes:   No results for input(s): CKTOTAL, CKMB, CKMBINDEX, TROPONINI in the last 168 hours. BNP (last 3 results) No results for input(s): BNP in the last 8760 hours.  ProBNP (last 3 results) No results for  input(s): PROBNP in the last 8760 hours.  CBG:  Recent Labs Lab 04/07/15 0740 04/08/15 0456 04/08/15 0554  GLUCAP 92 59* 90    No results found for this or any previous visit (from the past 240 hour(s)).   Studies: No results found.  Scheduled Meds: . hydrocortisone  25 mg Rectal BID  . metoprolol  2.5 mg Intravenous 3 times per day  . nicotine  21 mg Transdermal Daily  . pantoprazole (PROTONIX) IV  40 mg Intravenous Q24H    Continuous Infusions: . lactated ringers 125 mL/hr at 04/08/15 1134     Time spent:  Chele Cornell MD, PhD  [facility] Pager [phone]. If 7PM-7AM, please contact night-coverage at [email or web address], password [facility] 04/08/2015, 6:10 PM  LOS: 2 days

## 2015-04-08 NOTE — Progress Notes (Signed)
     [facility] Progress Note  Subjective:  Says that pain is improving.  Working on Animator and appears comfortable.  Had glycemic event last night with CBG of 59.  Lipase 997 this AM.  Objective:  Vital signs in last 24 hours: Temp:  [97.7 F (36.5 C)-98.3 F (36.8 C)] 98.3 F (36.8 C) (01/27 0455) Pulse Rate:  [73-97] 97 (01/27 0455) Resp:  [18] 18 (01/27 0455) BP: (143-171)/(78-105) 143/78 mmHg (01/27 0455) SpO2:  [94 %-98 %] 97 % (01/27 0455) Last BM Date: 04/06/15 General:  Alert, Well-developed, in NAD Heart:  Regular rate and rhythm; no murmurs Pulm:  CTAB.  No W/R/R. Abdomen:  Soft, non-distended. Normal bowel sounds.  Mild epigastric TTP.  Extremities:  Without edema. Neurologic:  Alert and oriented x 4;  grossly normal neurologically. Psych:  Alert and cooperative. Normal mood and affect.  Intake/Output from previous day: 01/26 0701 - 01/27 0700 In: 3175 [P.O.:130; I.V.:3045] Out: 1090 [Urine:1090]  Lab Results:  Recent Labs  04/06/15 0931 04/07/15 0413  WBC 6.8 7.3  HGB 15.4 14.1  HCT 46.2 44.1  PLT 271 227   BMET  Recent Labs  04/06/15 0931 04/07/15 0413 04/08/15 0350  NA 143 139 136  K 4.1 4.2 3.8  CL 108 104 101  CO2 GLUCOSE 109* 86 68  BUN CREATININE 1.04 0.92 0.79  CALCIUM 8.8* 8.6* 8.3*   LFT  Recent Labs  04/08/15 0350  PROT 5.7*  ALBUMIN 3.2*  AST 27  ALT 18  ALKPHOS 106  BILITOT 1.1   Dg Abd Acute W/chest  04/06/2015  CLINICAL DATA:  Rib pain, twisted run at work, generalized abdominal pain radiating around to back, constipation, history coronary artery disease, smoking, GERD, fatty liver EXAM: DG ABDOMEN ACUTE W/ 1V CHEST COMPARISON:  Chest radiograph 03/24/2015 FINDINGS: Normal heart size, mediastinal contours, and pulmonary vascularity. Emphysematous and bronchitic changes question COPD. Calcified granuloma RIGHT upper lobe. No definite pulmonary infiltrate, pleural effusion, or  pneumothorax. Bowel gas pattern normal. No bowel dilatation, bowel wall thickening or free intraperitoneal air. Osseous structures normal. No urinary tract calcification. IMPRESSION: COPD changes without acute infiltrate. Electronically Signed   By: Ulyses Southward M.D.   On: 04/06/2015 09:52    Assessment / Plan: *Acute on chronic pancreatitis secondary to etoh use, "groove" pancreatitis at last admission. Drank ETOH on 1/21.  -Plan for now is standard supportive care for acute pancreatitis with IVF's (is on LR 125 mL/hour), anti-emetics, and pain control. -We have rescheduled his EUS for March 2 per Dr. Christella Hartigan. -Needs to completely abstain from ETOH to avoid future episodes between now and then, and altogether. -Will give clear liquids today.    LOS: 2 days   ZEHR, JESSICA D.  04/08/2015, 8:46 AM  Pager number [phone] \   Attending physician's note   I have taken an interval history, reviewed the chart and examined the patient. I agree with the Advanced Practitioner's note, impression and recommendations. Abd pain is improving and lipase has decreased. Trial of clear liquids today. Avoid all etoh. Consider advancing to low fat, full liquids after 1700 today if clears are well tolerated.   Claudette Head, MD [facility] [phone] Mon-Fri 8a-5p [phone] after 5p, weekends, holidays

## 2015-04-08 NOTE — Progress Notes (Signed)
Paged NP Craige Cotta about low CBG.

## 2015-04-09 LAB — COMPREHENSIVE METABOLIC PANEL
ALBUMIN: 3.1 g/dL — AB (ref 3.5–5.0)
ALK PHOS: 90 U/L (ref 38–126)
ALT: 15 U/L — ABNORMAL LOW (ref 17–63)
ANION GAP: 8 (ref 5–15)
AST: 23 U/L (ref 15–41)
BUN: 5 mg/dL — ABNORMAL LOW (ref 6–20)
CHLORIDE: 103 mmol/L (ref 101–111)
CO2: 27 mmol/L (ref 22–32)
Calcium: 8.5 mg/dL — ABNORMAL LOW (ref 8.9–10.3)
Creatinine, Ser: 0.78 mg/dL (ref 0.61–1.24)
GFR calc Af Amer: >60 mL/min (ref >60)
GFR calc non Af Amer: >60 mL/min (ref >60)
GLUCOSE: 97 mg/dL (ref 65–99)
POTASSIUM: 3.5 mmol/L (ref 3.5–5.1)
SODIUM: 138 mmol/L (ref 135–145)
Total Bilirubin: 0.6 mg/dL (ref 0.3–1.2)
Total Protein: 5.6 g/dL — ABNORMAL LOW (ref 6.5–8.1)

## 2015-04-09 LAB — GLUCOSE, CAPILLARY: GLUCOSE-CAPILLARY: 91 mg/dL (ref 65–99)

## 2015-04-09 LAB — LIPASE, BLOOD: Lipase: 143 U/L — ABNORMAL HIGH (ref 11–51)

## 2015-04-09 MED ORDER — ASPIRIN EC 81 MG PO TBEC: 81.0000 mg | DELAYED_RELEASE_TABLET | Freq: Every day | ORAL | Status: DC

## 2015-04-09 MED ORDER — HYDROCODONE-ACETAMINOPHEN 5-325 MG PO TABS: ORAL_TABLET | ORAL | Status: DC

## 2015-04-09 MED ORDER — METOPROLOL TARTRATE 25 MG PO TABS: 12.5000 mg | ORAL_TABLET | Freq: Two times a day (BID) | ORAL | Status: DC

## 2015-04-09 NOTE — Progress Notes (Signed)
     [facility] Progress Note  Subjective:  Feeling better.  Pain 6-7 at worst.  Tolerating clear liquids.  Hopeful for D/C tomorrow.  Objective:  Vital signs in last 24 hours: Temp:  [98.4 F (36.9 C)-98.6 F (37 C)] 98.4 F (36.9 C) (01/28 0524) Pulse Rate:  [81-90] 90 (01/28 0524) Resp:  [18] 18 (01/28 0524) BP: (126-161)/(83-103) 126/83 mmHg (01/28 0524) SpO2:  [97 %-99 %] 99 % (01/28 0524) Last BM Date: 04/09/15 General:  Alert, Well-developed,  in NAD Heart:  Regular rate and rhythm; no murmurs Pulm:  CTAB.  No W/R/R. Abdomen:  Soft, non-distended. Normal bowel sounds.  Minimal epigastric TTP. Extremities:  Without edema. Neurologic:  Alert and oriented x 4;  grossly normal neurologically. Psych:  Alert and cooperative. Normal mood and affect.  Intake/Output from previous day: 01/27 0701 - 01/28 0700 In: 1722 [P.O.:1722] Out: 3350 [Urine:3350] Intake/Output this shift: Total I/O In: 3568.8 [I.V.:3568.8] Out: 500 [Urine:500]  Lab Results:  Recent Labs  04/07/15 0413 04/08/15 0933  WBC 7.3 10.0  HGB 14.1 14.6  HCT 44.1 43.9  PLT 227 251   BMET  Recent Labs  04/07/15 0413 04/08/15 0350 04/09/15 0415  NA 139 136 138  K 4.2 3.8 3.5  CL 104 101 103  CO2 GLUCOSE 86 68 97  BUN 8 10 5*  CREATININE 0.92 0.79 0.78  CALCIUM 8.6* 8.3* 8.5*   LFT  Recent Labs  04/09/15 0415  PROT 5.6*  ALBUMIN 3.1*  AST 23  ALT 15*  ALKPHOS 90  BILITOT 0.6   Assessment / Plan: *Acute on chronic pancreatitis secondary to etoh use, "groove" pancreatitis at last admission. Drank ETOH on 1/21.  -Will decrease IVF's to 75 mL/hour.  Will advance to low-fat full liquids for lunch and can advance to low-fat soft diet for dinner if tolerated.  Need to start transitioning to PO pain meds as well with anticipation of discharge maybe tomorrow AM, which we will defer to hospitalists. -We have rescheduled his EUS for March 2 per Dr. Christella Hartigan. -Needs to  completely abstain from ETOH to avoid future episodes between now and then, and altogether.   LOS: 3 days   ZEHR, JESSICA D.  04/09/2015, 11:31 AM  Pager number [phone]    Attending physician's note   I have taken an interval history, reviewed the chart and examined the patient. I agree with the Advanced Practitioner's note, impression and recommendations. Pain has decreased. Lipase decreased to 143. Acute on chronic pancreatitis is rapidly improving. Transitioning to PO pain medications. Advance diet to low fat full liquids. Possible discharge home tomorrow. GI follow up with Dr. Christella Hartigan for EUS on 3/2.  Claudette Head, MD [facility] [phone] Mon-Fri 8a-5p [phone] after 5p, weekends, holidays

## 2015-04-09 NOTE — Progress Notes (Signed)
PROGRESS NOTE  Douglas [name] WJX:[id number] DOB: 27-Jan-1966 DOA: 04/06/2015 PCP: No PCP Per Patient  HPI/Recap of past 24 hours:  Feeling better, less pain, no vomiting, reported diarrhea x1 , want to advance diet  Assessment/Plan: Principal Problem:   Pancreatitis, acute Active Problems:   Abdominal pain   Acute pancreatitis   Rectal bleeding   Hypocalcemia   Smoker   Acute on chronic pancreatitis:  likely related to alcohol. Lipid panel pending. No gallstone per prior CT and MRI ab.  MRI from 03/07/15 showed pancreatitis with ? mass. Lipase is: 1159. LFTs are: WNL. Monitor calcium/glucose level. GI input appreciated, recommends conservative management wit npo/ivf/suuportive care, need to reschedule EUS when acute pancreatitis resolves. Better, diet advancement per GI.  Pancreatic mass?  Divisum? Groove pancreatitis? EUS rescheduled for 3/2 per GI when acute pancreatitis resolves,  Repeat MRI/MRCP on 3/13, GI following  Rectal bleed due to hemorrhoids: Anusol, h/h stable, avoid constipation.  CAD s/p stent 01/2014, patient has not been compliant with meds and has not followed with cardiology. Advised patient to follow up with cardiology closely.  HTN; has not been on meds consistently, was recently on betablocker, on iv lopressor and prn hydralazine for bp control for now due to npo status.  Cigarette smoking: cessation education provided, no nicotine patch  Alcohol use: abstinence education provided, on withdrawal symptom for now.  Macrocytosis: likely related to alcohol, tsh/b12/folate wnl.   Code Status: full  Family Communication: patient   Disposition Plan: home when able to tolerate diet   Consultants:  GI  Procedures: none Antibiotics:  none   Objective: BP 126/83 mmHg  Pulse 90  Temp(Src) 98.4 F (36.9 C) (Oral)  Resp 18  Ht  (1.803 m)  Wt 65.2 kg (143 lb 11.8 oz)  BMI 20.06 kg/m2  SpO2 99%  Intake/Output Summary (Last 24  hours) at 04/09/15 1225 Last data filed at 04/09/15 1205  Gross per 24 hour  Intake 5551.17 ml  Output   4000 ml  Net 1551.17 ml   Filed Weights   04/06/15 1230  Weight: 65.2 kg (143 lb 11.8 oz)    Exam:   General:  NAD  Cardiovascular: RRR  Respiratory: CTABL  Abdomen: mild tender, no guarding , no rebound, Soft/NT, positive BS  Musculoskeletal: No Edema  Neuro: aaox3  Data Reviewed: Basic Metabolic Panel:  Recent Labs Lab 04/06/15 0931 04/07/15 0413 04/08/15 0350 04/09/15 0415  NA 143 139 136 138  K 4.1 4.2 3.8 3.5  CL 108 104 101 103  CO2 GLUCOSE 109* 86 68 97  BUN 5*  CREATININE 1.04 0.92 0.79 0.78  CALCIUM 8.8* 8.6* 8.3* 8.5*   Liver Function Tests:  Recent Labs Lab 04/06/15 0931 04/08/15 0350 04/09/15 0415  AST ALT 21 18 15*  ALKPHOS 123 106 90  BILITOT 0.4 1.1 0.6  PROT 6.4* 5.7* 5.6*  ALBUMIN 3.8 3.2* 3.1*    Recent Labs Lab 04/06/15 0931 04/07/15 0413 04/08/15 0350 04/09/15 0415  LIPASE 1159* 1388* 997* 143*   No results for input(s): AMMONIA in the last 168 hours. CBC:  Recent Labs Lab 04/06/15 0931 04/07/15 0413 04/08/15 0933  WBC 6.8 7.3 10.0  NEUTROABS 4.1  --   --   HGB 15.4 14.1 14.6  HCT 46.2 44.1 43.9  MCV 100.4* 101.4* 100.0  PLT 271 227 251   Cardiac Enzymes:   No results for input(s): CKTOTAL, CKMB, CKMBINDEX, TROPONINI  in the last 168 hours. BNP (last 3 results) No results for input(s): BNP in the last 8760 hours.  ProBNP (last 3 results) No results for input(s): PROBNP in the last 8760 hours.  CBG:  Recent Labs Lab 04/07/15 0740 04/08/15 0456 04/08/15 0554 04/09/15 0756  GLUCAP 92 59* 90 91    No results found for this or any previous visit (from the past 240 hour(s)).   Studies: No results found.  Scheduled Meds: . hydrocortisone  25 mg Rectal BID  . metoprolol  2.5 mg Intravenous 3 times per day  . nicotine  21 mg Transdermal Daily  . pantoprazole  (PROTONIX) IV  40 mg Intravenous Q24H    Continuous Infusions: . lactated ringers 125 mL/hr at 04/09/15 1205     Time spent: FMLA form filled out for the patient.  Douglas Sindelar MD, PhD  [facility] Pager [phone]. If 7PM-7AM, please contact night-coverage at [email or web address], password [facility] 04/09/2015, 12:25 PM  LOS: 3 days

## 2015-04-10 LAB — COMPREHENSIVE METABOLIC PANEL
ALT: 17 U/L (ref 17–63)
ANION GAP: 6 (ref 5–15)
AST: 23 U/L (ref 15–41)
Albumin: 2.9 g/dL — ABNORMAL LOW (ref 3.5–5.0)
Alkaline Phosphatase: 87 U/L (ref 38–126)
BUN: <5 mg/dL — ABNORMAL LOW (ref 6–20)
CHLORIDE: 105 mmol/L (ref 101–111)
CO2: 27 mmol/L (ref 22–32)
Calcium: 8.3 mg/dL — ABNORMAL LOW (ref 8.9–10.3)
Creatinine, Ser: 0.7 mg/dL (ref 0.61–1.24)
Glucose, Bld: 108 mg/dL — ABNORMAL HIGH (ref 65–99)
POTASSIUM: 3.6 mmol/L (ref 3.5–5.1)
SODIUM: 138 mmol/L (ref 135–145)
Total Bilirubin: 0.7 mg/dL (ref 0.3–1.2)
Total Protein: 5.3 g/dL — ABNORMAL LOW (ref 6.5–8.1)

## 2015-04-10 LAB — LIPASE, BLOOD: LIPASE: 75 U/L — AB (ref 11–51)

## 2015-04-10 LAB — GLUCOSE, CAPILLARY: GLUCOSE-CAPILLARY: 148 mg/dL — AB (ref 65–99)

## 2015-04-10 MED ORDER — PANTOPRAZOLE SODIUM 40 MG PO TBEC: 40.0000 mg | DELAYED_RELEASE_TABLET | Freq: Every day | ORAL | Status: DC

## 2015-04-10 NOTE — Discharge Summary (Signed)
Discharge Summary  Douglas [name]:[id number] DOB: 1965-10-16  PCP: No PCP Per Patient  Admit date: 04/06/2015 Discharge date: 04/10/2015  Time spent: <23mins  Recommendations for Outpatient Follow-up:  1. F/u with [facility] on 3/2 2. F/u with cardiology  3. Patient to establish care with PMD  Discharge Diagnoses:  Active Hospital Problems   Diagnosis Date Noted  . Pancreatitis, acute 03/09/2015  . Smoker   . Acute pancreatitis 04/06/2015  . Rectal bleeding 04/06/2015  . Hypocalcemia 04/06/2015  . Abdominal pain 03/05/2015    Resolved Hospital Problems   Diagnosis Date Noted Date Resolved  . Pancreatitis  04/06/2015    Discharge Condition: stable  Diet recommendation: heart healthy/low fat/ no alcohol  Filed Weights   04/06/15 1230  Weight: 65.2 kg (143 lb 11.8 oz)    History of present illness:  Douglas [name] is an 50 y.o. male with a PMH of CAD, tobacco abuse, pancreatitis with recent hospitalization 03/05/15-03/10/15 where a CT was performed which showed focal pancreatitis versus pancreatic mass lesion. He was seen by GI during that visit and recommended to have an outpatient EUS. The patient followed up with GI on 03/28/15 where he reportedly complained of ongoing abdominal pain and a 10-12 pound weight loss from the time of his hospitalization. An EUS was scheduled 04/21/15 with Dr. Christella Hartigan. He still does not have a PCP because he doesn't have the $90 co-pay needed to schedule the appointment. He presents today with abdominal pain that began yesterday afternoon. He tells me "ibuprofen doesn't touch it". The pain radiates to his back and is sharp. Pain is currently a 7/10, but was "11/10" before receiving pain medications in the ER. He has had some associated nausea and vomiting, the last episode was yesterday. He has had some loose stools, but feels like he has to "force himself to poop", and that he has had some hemorrhoidal bleeding. Denies alcohol use, but  states he did have "a couple of drinks" on 03/13/15. Upon initial evaluation in the ED, the patient was found to have significant elevation of his lipase.  Hospital Course:  Principal Problem:   Pancreatitis, acute Active Problems:   Abdominal pain   Acute pancreatitis   Rectal bleeding   Hypocalcemia   Smoker  Acute on chronic pancreatitis:  likely related to alcohol. Lipid panel pending. No gallstone per prior CT and MRI ab.  MRI from 03/07/15 showed pancreatitis with ? mass. Lipase is: 1159. LFTs are: WNL. Monitor calcium/glucose level. GI input appreciated, recommends conservative management wit npo/ivf/suuportive care, need to reschedule EUS when acute pancreatitis resolves.  diet advancement per GI. Better, patient request to be discharged home on 1/29, he is aware of close GI follow up 10 tabs of vicodin prescribed at discharge.  Pancreatic mass? Divisum? Groove pancreatitis? EUS rescheduled for 3/2 per GI when acute pancreatitis resolves, Repeat MRI/MRCP on 3/13, GI following  Rectal bleed due to hemorrhoids: Anusol, h/h stable, avoid constipation.  CAD s/p stent 01/2014, patient has not been compliant with meds and has not followed with cardiology. Advised patient to follow up with cardiology closely.  HTN; has not been on meds consistently, was recently on betablocker, on iv lopressor and prn hydralazine for bp control for now due to npo status. Resume lopressor once able to take po meds. Discharged on lopressor.  Cigarette smoking: cessation education provided, no nicotine patch  Alcohol use: abstinence education provided, on withdrawal symptom for now.  Macrocytosis: likely related to alcohol, tsh/b12/folate wnl.   Code  Status: full  Family Communication: patient   Disposition Plan: home when able to tolerate diet   Consultants:  GI  Procedures: none Antibiotics:  none Procedures:  *  Consultations:  *  Discharge Exam: BP 178/97 mmHg   Pulse 80  Temp(Src) 98.4 F (36.9 C) (Oral)  Resp 16  Ht  (1.803 m)  Wt 65.2 kg (143 lb 11.8 oz)  BMI 20.06 kg/m2  SpO2 99%  General: * Cardiovascular: * Respiratory: *  Discharge Instructions You were cared for by a hospitalist during your hospital stay. If you have any questions about your discharge medications or the care you received while you were in the hospital after you are discharged, you can call the unit and asked to speak with the hospitalist on call if the hospitalist that took care of you is not available. Once you are discharged, your primary care physician will handle any further medical issues. Please note that NO REFILLS for any discharge medications will be authorized once you are discharged, as it is imperative that you return to your primary care physician (or establish a relationship with a primary care physician if you do not have one) for your aftercare needs so that they can reassess your need for medications and monitor your lab values.  Discharge Instructions    Diet - low sodium heart healthy    Complete by:  As directed   Low fat, no alcohol.     Increase activity slowly    Complete by:  As directed             Medication List    STOP taking these medications        ibuprofen 800 MG tablet  Commonly known as:  ADVIL,MOTRIN     omeprazole 20 MG tablet  Commonly known as:  PRILOSEC OTC      TAKE these medications        aspirin EC 81 MG tablet  Take 1 tablet (81 mg total) by mouth daily.     HYDROcodone-acetaminophen 5-325 MG tablet  Commonly known as:  NORCO/VICODIN  Take 1 tab every 6-8 hours as needed for pain     metoprolol tartrate 25 MG tablet  Commonly known as:  LOPRESSOR  Take 0.5 tablets (12.5 mg total) by mouth 2 (two) times daily.     pantoprazole 40 MG tablet  Commonly known as:  PROTONIX  Take 1 tab by mouth every morning.       Allergies  Allergen Reactions  . Ibuprofen Other (See Comments)    Upset stomach  .  Shellfish Allergy Nausea And Vomiting       Follow-up Information    Follow up with Rachael Fee, MD On 05/12/2015.   Specialty:  Gastroenterology   Why:  pancreatitis   Contact information:   [address] [place] [address] [phone]       Follow up with Nanetta Batty, MD In 1 month.   Specialties:  Cardiology, Radiology   Why:  coranary artery disease s/p stent   Contact information:   [address] [place] [address] [phone]        The results of significant diagnostics from this hospitalization (including imaging, microbiology, ancillary and laboratory) are listed below for reference.    Significant Diagnostic Studies: Dg Chest 2 View  03/22/2015  CLINICAL DATA:  Left side chest pain for few days with worsening EXAM: CHEST  2 VIEW COMPARISON:  01/18/2014 FINDINGS: Cardiomediastinal silhouette is  stable. No acute infiltrate or pleural effusion. No pulmonary edema. Bony thorax is unremarkable. IMPRESSION: No active cardiopulmonary disease. Electronically Signed   By: Natasha Mead M.D.   On: 03/22/2015 10:49   Dg Ribs Unilateral W/chest Left  03/24/2015  CLINICAL DATA:  50 year old male with fall and left rib pain. EXAM: LEFT RIBS AND CHEST - 3+ VIEW COMPARISON:  Chest radiograph dated 03/22/2015 FINDINGS: There is nondisplaced fracture of the lateral left 8th rib. No pneumothorax. The lungs are clear. A stable 4 mm right upper lobe calcific nodule, likely a granuloma. The soft tissues appear unremarkable. IMPRESSION: Nondisplaced fracture of the lateral aspect of the left eighth rib. No pneumothorax. Electronically Signed   By: Elgie Collard M.D.   On: 03/24/2015 23:46   Dg Abd Acute W/chest  04/06/2015  CLINICAL DATA:  Rib pain, twisted run at work, generalized abdominal pain radiating around to back, constipation, history coronary artery disease, smoking, GERD, fatty liver EXAM: DG ABDOMEN ACUTE W/ 1V CHEST COMPARISON:  Chest radiograph  03/24/2015 FINDINGS: Normal heart size, mediastinal contours, and pulmonary vascularity. Emphysematous and bronchitic changes question COPD. Calcified granuloma RIGHT upper lobe. No definite pulmonary infiltrate, pleural effusion, or pneumothorax. Bowel gas pattern normal. No bowel dilatation, bowel wall thickening or free intraperitoneal air. Osseous structures normal. No urinary tract calcification. IMPRESSION: COPD changes without acute infiltrate. Electronically Signed   By: Ulyses Southward M.D.   On: 04/06/2015 09:52    Microbiology: No results found for this or any previous visit (from the past 240 hour(s)).   Labs: Basic Metabolic Panel:  Recent Labs Lab 04/06/15 0931 04/07/15 0413 04/08/15 0350 04/09/15 0415 04/10/15 0413  NA 143 139 136 138 138  K 4.1 4.2 3.8 3.5 3.6  CL 108 104 101 103 105  CO2 GLUCOSE 109* 86 68 97 108*  BUN 5* <5*  CREATININE 1.04 0.92 0.79 0.78 0.70  CALCIUM 8.8* 8.6* 8.3* 8.5* 8.3*   Liver Function Tests:  Recent Labs Lab 04/06/15 0931 04/08/15 0350 04/09/15 0415 04/10/15 0413  AST ALT 21 18 15* 17  ALKPHOS 123 106 90 87  BILITOT 0.4 1.1 0.6 0.7  PROT 6.4* 5.7* 5.6* 5.3*  ALBUMIN 3.8 3.2* 3.1* 2.9*    Recent Labs Lab 04/06/15 0931 04/07/15 0413 04/08/15 0350 04/09/15 0415 04/10/15 0413  LIPASE 1159* 1388* 997* 143* 75*   No results for input(s): AMMONIA in the last 168 hours. CBC:  Recent Labs Lab 04/06/15 0931 04/07/15 0413 04/08/15 0933  WBC 6.8 7.3 10.0  NEUTROABS 4.1  --   --   HGB 15.4 14.1 14.6  HCT 46.2 44.1 43.9  MCV 100.4* 101.4* 100.0  PLT 271 227 251   Cardiac Enzymes: No results for input(s): CKTOTAL, CKMB, CKMBINDEX, TROPONINI in the last 168 hours. BNP: BNP (last 3 results) No results for input(s): BNP in the last 8760 hours.  ProBNP (last 3 results) No results for input(s): PROBNP in the last 8760 hours.  CBG:  Recent Labs Lab 04/07/15 0740 04/08/15 0456  04/08/15 0554 04/09/15 0756 04/10/15 0802  GLUCAP 92 59* 90 91 148*       Signed:  Zana Biancardi MD, PhD  [facility] 04/10/2015, 12:32 PM

## 2015-04-10 NOTE — Progress Notes (Addendum)
Discharge instructions given to pt with a low fat diet instructions. Pt ate pudding,sherbet,cream potato soup, states his stomach is not hurting. Requesting to go home, Dr. Roda Shutters notified. Prescription given to pt. Pt refused wheelchair, stated he wanted to walk.

## 2015-04-10 NOTE — Progress Notes (Signed)
    Progress Note   Subjective  Tolerate full liquids but didn't tolerate soft diet however it did not appear to be a low fat diet. Required IV pain meds.   Objective  Vital signs in last 24 hours: Temp:  [97.6 F (36.4 C)-98.4 F (36.9 C)] 98.4 F (36.9 C) (01/29 0553) Pulse Rate:  [80-95] 80 (01/29 0553) Resp:  [16-18] 16 (01/29 0553) BP: (176-178)/(97-102) 178/97 mmHg (01/29 0553) SpO2:  [98 %-100 %] 99 % (01/29 0553) Last BM Date: 04/09/15  General: Alert, well-developed, in NAD Heart:  Regular rate and rhythm; no murmurs Chest: Clear to ascultation bilaterally Abdomen:  Soft, minimal epigastric tenderness and nondistended. Normal bowel sounds, without guarding, and without rebound.   Extremities:  Without edema. Neurologic:  Alert and  oriented x4; grossly normal neurologically. Psych:  Alert and cooperative. Normal mood and affect.  Intake/Output from previous day: 01/28 0701 - 01/29 0700 In: 4773.8 [P.O.:480; I.V.:4293.8] Out: 4725 [Urine:4725] Intake/Output this shift: Total I/O In: -  Out: 500 [Urine:500]  Lab Results:  Recent Labs  04/08/15 0933  WBC 10.0  HGB 14.6  HCT 43.9  PLT 251   BMET  Recent Labs  04/08/15 0350 04/09/15 0415 04/10/15 0413  NA 136 138 138  K 3.8 3.5 3.6  CL 101 103 105  CO2 GLUCOSE 68 97 108*  BUN 10 5* <5*  CREATININE 0.79 0.78 0.70  CALCIUM 8.3* 8.5* 8.3*   LFT  Recent Labs  04/10/15 0413  PROT 5.3*  ALBUMIN 2.9*  AST 23  ALT 17  ALKPHOS 87  BILITOT 0.7     Assessment & Plan   Acute on chronic pancreatitis secondary to etoh use, "groove" pancreatitis at last admission. Drank ETOH on 1/21. Tolerated low-fat full liquids for lunch yesterday but not soft diet for dinner.Resume low fat full liquid diet. OK for discharge later today or tomorrow from GI standpoint on low fat full liquids if his pain is controlled with PO medication. Needs to stay on a low fat diet as outpatient. -EUS scheduled for  March 2 per Dr. Christella Hartigan. -Needs to completely abstain from ETOH.  Principal Problem:   Pancreatitis, acute Active Problems:   Abdominal pain   Acute pancreatitis   Rectal bleeding   Hypocalcemia   Smoker   LOS: 4 days   Leanord Thibeau T. Russella Dar MD 04/10/2015, 9:36 AM [phone] 8a-5p weekdays [phone] after 5p, weekends, holidays

## 2015-04-11 LAB — FOLATE RBC
FOLATE, RBC: 1055 ng/mL (ref >498)
Folate, Hemolysate: 475.7 ng/mL
HEMATOCRIT: 45.1 % (ref 37.5–51.0)

## 2015-04-16 ENCOUNTER — Observation Stay (HOSPITAL_COMMUNITY): Payer: BLUE CROSS/BLUE SHIELD

## 2015-04-16 ENCOUNTER — Emergency Department (HOSPITAL_COMMUNITY): Payer: BLUE CROSS/BLUE SHIELD

## 2015-04-16 ENCOUNTER — Inpatient Hospital Stay (HOSPITAL_COMMUNITY)
Admission: EM | Admit: 2015-04-16 | Discharge: 2015-04-19 | DRG: 313 | Disposition: A | Discharge status: Home / Self Care | Payer: BLUE CROSS/BLUE SHIELD | Attending: Internal Medicine | Admitting: Internal Medicine

## 2015-04-16 ENCOUNTER — Encounter (HOSPITAL_COMMUNITY): Payer: Self-pay

## 2015-04-16 DIAGNOSIS — R079 Chest pain, unspecified: Secondary | ICD-10-CM | POA: Diagnosis not present

## 2015-04-16 DIAGNOSIS — K700 Alcoholic fatty liver: Secondary | ICD-10-CM | POA: Diagnosis present

## 2015-04-16 DIAGNOSIS — K852 Alcohol induced acute pancreatitis without necrosis or infection: Secondary | ICD-10-CM

## 2015-04-16 DIAGNOSIS — R1011 Right upper quadrant pain: Secondary | ICD-10-CM

## 2015-04-16 DIAGNOSIS — Z79899 Other long term (current) drug therapy: Secondary | ICD-10-CM

## 2015-04-16 DIAGNOSIS — K869 Disease of pancreas, unspecified: Secondary | ICD-10-CM | POA: Diagnosis present

## 2015-04-16 DIAGNOSIS — Z9119 Patient's noncompliance with other medical treatment and regimen: Secondary | ICD-10-CM

## 2015-04-16 DIAGNOSIS — F17210 Nicotine dependence, cigarettes, uncomplicated: Secondary | ICD-10-CM | POA: Diagnosis present

## 2015-04-16 DIAGNOSIS — K760 Fatty (change of) liver, not elsewhere classified: Secondary | ICD-10-CM | POA: Diagnosis not present

## 2015-04-16 DIAGNOSIS — Z720 Tobacco use: Secondary | ICD-10-CM | POA: Diagnosis not present

## 2015-04-16 DIAGNOSIS — K8689 Other specified diseases of pancreas: Secondary | ICD-10-CM | POA: Diagnosis present

## 2015-04-16 DIAGNOSIS — R109 Unspecified abdominal pain: Secondary | ICD-10-CM

## 2015-04-16 DIAGNOSIS — I252 Old myocardial infarction: Secondary | ICD-10-CM

## 2015-04-16 DIAGNOSIS — F1010 Alcohol abuse, uncomplicated: Secondary | ICD-10-CM | POA: Diagnosis present

## 2015-04-16 DIAGNOSIS — Q453 Other congenital malformations of pancreas and pancreatic duct: Secondary | ICD-10-CM

## 2015-04-16 DIAGNOSIS — K219 Gastro-esophageal reflux disease without esophagitis: Secondary | ICD-10-CM | POA: Diagnosis present

## 2015-04-16 DIAGNOSIS — Z955 Presence of coronary angioplasty implant and graft: Secondary | ICD-10-CM

## 2015-04-16 DIAGNOSIS — I25119 Atherosclerotic heart disease of native coronary artery with unspecified angina pectoris: Secondary | ICD-10-CM | POA: Diagnosis present

## 2015-04-16 LAB — CBC
HCT: 47.3 % (ref 39.0–52.0)
HEMATOCRIT: 46.2 % (ref 39.0–52.0)
HEMOGLOBIN: 15.6 g/dL (ref 13.0–17.0)
Hemoglobin: 16.2 g/dL (ref 13.0–17.0)
MCH: 33.7 pg (ref 26.0–34.0)
MCH: 33.3 pg (ref 26.0–34.0)
MCHC: 34.2 g/dL (ref 30.0–36.0)
MCHC: 33.8 g/dL (ref 30.0–36.0)
MCV: 98.3 fL (ref 78.0–100.0)
MCV: 98.5 fL (ref 78.0–100.0)
PLATELETS: 319 10*3/uL (ref 150–400)
Platelets: 294 10*3/uL (ref 150–400)
RBC: 4.81 MIL/uL (ref 4.22–5.81)
RBC: 4.69 MIL/uL (ref 4.22–5.81)
RDW: 13.1 % (ref 11.5–15.5)
RDW: 13.2 % (ref 11.5–15.5)
WBC: 8 10*3/uL (ref 4.0–10.5)
WBC: 7.8 10*3/uL (ref 4.0–10.5)

## 2015-04-16 LAB — POC OCCULT BLOOD, ED: Fecal Occult Bld: NEGATIVE

## 2015-04-16 LAB — LIPASE, BLOOD: LIPASE: 59 U/L — AB (ref 11–51)

## 2015-04-16 LAB — HEPATIC FUNCTION PANEL
ALK PHOS: 98 U/L (ref 38–126)
ALT: 22 U/L (ref 17–63)
AST: 29 U/L (ref 15–41)
Albumin: 3.1 g/dL — ABNORMAL LOW (ref 3.5–5.0)
BILIRUBIN DIRECT: 0.2 mg/dL (ref 0.1–0.5)
BILIRUBIN INDIRECT: 0.3 mg/dL (ref 0.3–0.9)
Total Bilirubin: 0.5 mg/dL (ref 0.3–1.2)
Total Protein: 5.7 g/dL — ABNORMAL LOW (ref 6.5–8.1)

## 2015-04-16 LAB — BASIC METABOLIC PANEL
ANION GAP: 11 (ref 5–15)
BUN: 7 mg/dL (ref 6–20)
CALCIUM: 8.6 mg/dL — AB (ref 8.9–10.3)
CO2: 24 mmol/L (ref 22–32)
CREATININE: 1.02 mg/dL (ref 0.61–1.24)
Chloride: 107 mmol/L (ref 101–111)
Glucose, Bld: 107 mg/dL — ABNORMAL HIGH (ref 65–99)
Potassium: 4.1 mmol/L (ref 3.5–5.1)
SODIUM: 142 mmol/L (ref 135–145)

## 2015-04-16 LAB — CREATININE, SERUM: Creatinine, Ser: 0.96 mg/dL (ref 0.61–1.24)

## 2015-04-16 LAB — TROPONIN I
Troponin I: <0.03 ng/mL (ref <0.031)
Troponin I: <0.03 ng/mL (ref <0.031)
Troponin I: <0.03 ng/mL (ref <0.031)

## 2015-04-16 LAB — I-STAT TROPONIN, ED: TROPONIN I, POC: 0.01 ng/mL (ref 0.00–0.08)

## 2015-04-16 MED ORDER — ASPIRIN 300 MG RE SUPP: 300.0000 mg | RECTAL | Status: AC

## 2015-04-16 MED ORDER — LORAZEPAM 1 MG PO TABS
1.0000 mg | ORAL_TABLET | Freq: Four times a day (QID) | ORAL | Status: AC | PRN
  Administered 2015-04-17: 1 mg via ORAL
  Filled 2015-04-16: qty 1

## 2015-04-16 MED ORDER — ADULT MULTIVITAMIN W/MINERALS CH
1.0000 | ORAL_TABLET | Freq: Every day | ORAL | Status: DC
  Administered 2015-04-16 – 2015-04-19 (×4): 1 via ORAL
  Filled 2015-04-16 (×4): qty 1

## 2015-04-16 MED ORDER — ONDANSETRON HCL 4 MG/2ML IJ SOLN: 4.0000 mg | Freq: Four times a day (QID) | INTRAMUSCULAR | Status: DC | PRN

## 2015-04-16 MED ORDER — ENOXAPARIN SODIUM 40 MG/0.4ML ~~LOC~~ SOLN
40.0000 mg | SUBCUTANEOUS | Status: DC
  Administered 2015-04-16 – 2015-04-18 (×3): 40 mg via SUBCUTANEOUS
  Filled 2015-04-16 (×3): qty 0.4

## 2015-04-16 MED ORDER — METOPROLOL TARTRATE 12.5 MG HALF TABLET: 12.5000 mg | ORAL_TABLET | Freq: Two times a day (BID) | ORAL | Status: DC

## 2015-04-16 MED ORDER — TICAGRELOR 90 MG PO TABS: 90.0000 mg | ORAL_TABLET | Freq: Two times a day (BID) | ORAL | Status: DC

## 2015-04-16 MED ORDER — THIAMINE HCL 100 MG/ML IJ SOLN: 100.0000 mg | Freq: Every day | INTRAMUSCULAR | Status: DC

## 2015-04-16 MED ORDER — VITAMIN B-1 100 MG PO TABS
100.0000 mg | ORAL_TABLET | Freq: Every day | ORAL | Status: DC
  Administered 2015-04-16 – 2015-04-19 (×4): 100 mg via ORAL
  Filled 2015-04-16 (×4): qty 1

## 2015-04-16 MED ORDER — FOLIC ACID 1 MG PO TABS
1.0000 mg | ORAL_TABLET | Freq: Every day | ORAL | Status: DC
  Administered 2015-04-16 – 2015-04-19 (×4): 1 mg via ORAL
  Filled 2015-04-16 (×4): qty 1

## 2015-04-16 MED ORDER — SODIUM CHLORIDE 0.9 % IV BOLUS (SEPSIS)
1000.0000 mL | Freq: Once | INTRAVENOUS | Status: AC
  Administered 2015-04-16: 1000 mL via INTRAVENOUS

## 2015-04-16 MED ORDER — SODIUM CHLORIDE 0.9 % IV SOLN
INTRAVENOUS | Status: AC
  Administered 2015-04-16: 16:00:00 via INTRAVENOUS

## 2015-04-16 MED ORDER — MORPHINE SULFATE (PF) 2 MG/ML IV SOLN
2.0000 mg | INTRAVENOUS | Status: DC | PRN
  Administered 2015-04-16 – 2015-04-19 (×20): 2 mg via INTRAVENOUS
  Filled 2015-04-16 (×21): qty 1

## 2015-04-16 MED ORDER — PANTOPRAZOLE SODIUM 40 MG PO TBEC
40.0000 mg | DELAYED_RELEASE_TABLET | Freq: Every day | ORAL | Status: DC
  Administered 2015-04-16 – 2015-04-19 (×4): 40 mg via ORAL
  Filled 2015-04-16 (×4): qty 1

## 2015-04-16 MED ORDER — ASPIRIN 81 MG PO CHEW: 324.0000 mg | CHEWABLE_TABLET | ORAL | Status: AC

## 2015-04-16 MED ORDER — ASPIRIN EC 81 MG PO TBEC
81.0000 mg | DELAYED_RELEASE_TABLET | Freq: Every day | ORAL | Status: DC
  Administered 2015-04-17 – 2015-04-19 (×3): 81 mg via ORAL
  Filled 2015-04-16 (×3): qty 1

## 2015-04-16 MED ORDER — TICAGRELOR 90 MG PO TABS
90.0000 mg | ORAL_TABLET | Freq: Two times a day (BID) | ORAL | Status: DC
  Administered 2015-04-16 – 2015-04-19 (×7): 90 mg via ORAL
  Filled 2015-04-16 (×8): qty 1

## 2015-04-16 MED ORDER — NICOTINE 14 MG/24HR TD PT24
14.0000 mg | MEDICATED_PATCH | Freq: Every day | TRANSDERMAL | Status: DC
  Administered 2015-04-16 – 2015-04-19 (×4): 14 mg via TRANSDERMAL
  Filled 2015-04-16 (×4): qty 1

## 2015-04-16 MED ORDER — NITROGLYCERIN 0.4 MG SL SUBL: 0.4000 mg | SUBLINGUAL_TABLET | SUBLINGUAL | Status: DC | PRN

## 2015-04-16 MED ORDER — ACETAMINOPHEN 325 MG PO TABS
650.0000 mg | ORAL_TABLET | ORAL | Status: DC | PRN
  Administered 2015-04-16 – 2015-04-19 (×3): 650 mg via ORAL
  Filled 2015-04-16 (×3): qty 2

## 2015-04-16 MED ORDER — LABETALOL HCL 5 MG/ML IV SOLN
10.0000 mg | INTRAVENOUS | Status: DC | PRN
  Administered 2015-04-16: 10 mg via INTRAVENOUS
  Filled 2015-04-16: qty 4

## 2015-04-16 MED ORDER — METOPROLOL TARTRATE 12.5 MG HALF TABLET
12.5000 mg | ORAL_TABLET | Freq: Two times a day (BID) | ORAL | Status: DC
  Administered 2015-04-16 – 2015-04-19 (×7): 12.5 mg via ORAL
  Filled 2015-04-16 (×8): qty 1

## 2015-04-16 MED ORDER — LORAZEPAM 2 MG/ML IJ SOLN
1.0000 mg | Freq: Four times a day (QID) | INTRAMUSCULAR | Status: AC | PRN
  Administered 2015-04-18 – 2015-04-19 (×6): 1 mg via INTRAVENOUS
  Filled 2015-04-16 (×6): qty 1

## 2015-04-16 MED ORDER — METOCLOPRAMIDE HCL 5 MG/ML IJ SOLN
10.0000 mg | Freq: Once | INTRAMUSCULAR | Status: AC
  Administered 2015-04-16: 10 mg via INTRAMUSCULAR
  Filled 2015-04-16: qty 2

## 2015-04-16 MED ORDER — HYDROMORPHONE HCL 1 MG/ML IJ SOLN
1.0000 mg | Freq: Once | INTRAMUSCULAR | Status: AC
  Administered 2015-04-16: 1 mg via INTRAVENOUS
  Filled 2015-04-16: qty 1

## 2015-04-16 MED ORDER — PNEUMOCOCCAL VAC POLYVALENT 25 MCG/0.5ML IJ INJ
0.5000 mL | INJECTION | INTRAMUSCULAR | Status: AC
  Administered 2015-04-17: 0.5 mL via INTRAMUSCULAR
  Filled 2015-04-16: qty 0.5

## 2015-04-16 NOTE — H&P (Addendum)
[facility] History and Physical  Douglas [name] ZOX:[id number] DOB: [birth year] DOA: 04/16/2015   PCP: No PCP Per Patient    Chief Complaint: chest pain and vomting  HPI: Douglas [name] is a 50 y.o. male of alcohol and tobacco abuse, and STEMI status post stent in November 2015, recent acute pancreatitis secondary to alcohol use who returns with a complaint of left-sided chest pain. Patient states that after being discharged from the hospital last Monday, overall he was doing relatively well. He states that he was given a prescription for 10 tablets of Percocet and he used all of these tablets within 24 hours. He began to have intermittent abdominal pain and nausea and therefore did cut back on his solid food and changed to liquid diet which helped this to resolve. Starting yesterday he began having left-sided chest pain and then last night noticed numbness and tingling in his left arm associated with the pain. He does not feel that there is an exertional component to his pain. Did not complain of any diaphoresis or palpitations. Subsequent to the pain he had 2 episodes of vomiting last night. He decided not to come to the hospital however, after he woke up this morning he had more vomiting and more pain. He claims that he has only had 2 beers during the entire duration that he's been home from the hospital. On exam he is tender in the right upper quadrant. No history of cholecystectomy. He is unable to relate the pain to eating fatty food. No complaint of fever chills or diarrhea.    General: The patient denies anorexia, fever, weight loss Cardiac: Denies syncope, palpitations, pedal edema  Respiratory: Denies cough, shortness of breath, wheezing GI: Denies severe indigestion/heartburn, +abdominal pain, nausea, vomiting, diarrhea and constipation GU: Denies hematuria, incontinence, dysuria  Musculoskeletal: Denies arthritis  Skin: Denies suspicious skin lesions Neurologic: Denies  focal weakness or change in vision Psychiatry: Denies depression or anxiety. Hematologic: no easy bruising or bleeding  All other systems reviewed and found to be negative.  Past Medical History  Diagnosis Date  . Tobacco abuse   . Coronary artery disease   . Anginal pain (HCC)   . GERD (gastroesophageal reflux disease)   . NSTEMI (non-ST elevated myocardial infarction) (HCC) 01/2014  . Pancreatitis   . PTSD (post-traumatic stress disorder)     Past Surgical History  Procedure Laterality Date  . Coronary angioplasty with stent placement  01/18/2014    "1"  . Left heart catheterization with coronary angiogram N/A 01/18/2014    Procedure: LEFT HEART CATHETERIZATION WITH CORONARY ANGIOGRAM;  Surgeon: Runell Gess, MD;  Location: [facility] CATH LAB;  Service: Cardiovascular;  Laterality: N/A;    Social History: He is smoking 1 pack per day. As mentioned above, he states he is nearly quit drinking alcohol. Lives at home alone. 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 Ibuprofen Other (See Comments)    Upset stomach  . Shellfish Allergy Nausea And Vomiting    Family history:   Family History  Problem Relation Age of Onset  . Cancer Mother     Lung cancer  . Coronary artery disease Mother   . Heart failure Maternal Grandmother       Prior to Admission medications   Medication Sig Start Date End Date Taking? Authorizing Provider  aspirin EC 81 MG tablet Take 1 tablet (81 mg total) by mouth daily. 04/09/15  Yes Albertine Grates, MD  Aspirin-Salicylamide-Caffeine (BC HEADACHE POWDER PO) Take 1 packet by mouth daily as needed (pain).   Yes Historical Provider, MD  HYDROcodone-acetaminophen (NORCO/VICODIN) 5-325 MG tablet Take 1 tab every 6-8 hours as needed for pain 04/09/15  Yes Albertine Grates, MD  metoprolol tartrate (LOPRESSOR) 25 MG tablet Take 0.5 tablets (12.5 mg total) by mouth 2 (two) times daily. 04/09/15  Yes Albertine Grates, MD  pantoprazole (PROTONIX)  40 MG tablet Take 1 tab by mouth every morning. 03/28/15  Yes Amy Oswald Hillock, PA-C     Physical Exam: Filed Vitals:   04/16/15 0659 04/16/15 0845 04/16/15 0930 04/16/15 1000  BP: 151/91 154/93 160/99 158/105  Pulse: 90 99 85 93  Temp: 98.1 F (36.7 C)     TempSrc: Oral     Resp: 16     Height:      Weight:      SpO2: 100% 97% 96% 97%     General: Awake alert oriented 3, no acute distress HEENT: Normocephalic and Atraumatic, Mucous membranes pink                PERRLA; EOM intact; No scleral icterus,                 Nares: Patent, Oropharynx: Clear, Fair Dentition                 Neck: FROM, no cervical lymphadenopathy, thyromegaly, carotid bruit or JVD;  Breasts: deferred CHEST WALL: No tenderness  CHEST: Normal respiration, clear to auscultation bilaterally  HEART: Regular rate and rhythm; no murmurs rubs or gallops  BACK: No kyphosis or scoliosis; no CVA tenderness  GI: Positive Bowel Sounds, soft, tender in right upper quadrant; no masses, no organomegaly Rectal Exam: deferred MSK: No cyanosis, clubbing, or edema Genitalia: not examined  SKIN:  no rash or ulceration  CNS: Alert and Oriented x 4, Nonfocal exam, CN 2-12 intact  Labs on Admission:  Basic Metabolic Panel:  Recent Labs Lab 04/10/15 0413 04/16/15 0705  NA 138 142  K 3.6 4.1  CL 105 107  CO2 27 24  GLUCOSE 108* 107*  BUN <5* 7  CREATININE 0.70 1.02  CALCIUM 8.3* 8.6*   Liver Function Tests:  Recent Labs Lab 04/10/15 0413 04/16/15 0705  AST 23 29  ALT 17 22  ALKPHOS 87 98  BILITOT 0.7 0.5  PROT 5.3* 5.7*  ALBUMIN 2.9* 3.1*    Recent Labs Lab 04/10/15 0413 04/16/15 0705  LIPASE 75* 59*   No results for input(s): AMMONIA in the last 168 hours. CBC:  Recent Labs Lab 04/16/15 0705  WBC 8.0  HGB 16.2  HCT 47.3  MCV 98.3  PLT 319   Cardiac Enzymes: No results for input(s): CKTOTAL, CKMB, CKMBINDEX, TROPONINI in the last 168 hours.  BNP (last 3 results) No results for  input(s): BNP in the last 8760 hours.  ProBNP (last 3 results) No results for input(s): PROBNP in the last 8760 hours.  CBG:  Recent Labs Lab 04/10/15 0802  GLUCAP 148*    Radiological Exams on Admission: Dg Chest 2 View  04/16/2015  CLINICAL DATA:  Acute left-sided chest pain. EXAM: CHEST  2 VIEW COMPARISON:  April 06, 2015. FINDINGS: The heart size and mediastinal contours are within normal limits. No pneumothorax or pleural effusion is noted. Stable calcified granuloma is noted in right upper lobe. No acute pulmonary disease noted. The visualized skeletal structures are unremarkable. IMPRESSION: No active cardiopulmonary disease. Electronically Signed   By: Roque Lias  Montez Hageman, M.D.   On: 04/16/2015 07:58    EKG: Independently reviewed. Normal sinus rhythm  Assessment/Plan Principal Problem:   Chest pain with underlying coronary artery disease -Post stenting of RCA in November, 2015 who was also noted to have moderate disease involving the LAD and diagonal branch -He has not followed up with a cardiologist since the stent was placed - I am wondering that that he may be drinking more than he admits and the resultant vomiting may be causing his chest discomfort, however, to the fact that he does have a stent and is not on anticoagulation, we should further evaluate this with serial troponin and possibly with a stress test or cardiac cath. - I will, therefore consult cardiology- Addendum: Dr Donnie Aho has asked [place] to call back if troponin are positive of if chest pain does not resolve.  -I have resumed Brillinta in case this is unstable angina (can be stopped if this has been ruled out)- he states that he was only able to take it for 2 months as he was not able to afford it  Active Problems: Vomiting with right upper quadrant pain - multiple admissions recently for acute pancreatitis- lipase still elevated but mild - Upon reviewing prior imaging, it is noted that there was a suspicion of  distal common bile duct narrowing on an MRCP in December of last year. He also had type I pancreatic divisum. - Prior plan was to repeat an MRCP in 3 months- He may need an ERCP and therefore, we will ask for a GI eval -Clear liquids only for now-monitor for vomiting- when necessary Zofran and low-dose morphine ordered -? whether his recurrent admissions are because he is a narcotic seeker from his behavior- of note, he received 40 Vicodin on 1/16 from GI -  follow symptoms-would not discharge him with a Narcotics prescription    Tobacco abuse -Will order nicotine patch    Fatty liver -Likely related to alcohol abuse   Alcohol abuse -CIWA scale ordered (despite the fact that he states he is not drinking daily)    Consulted:   Code Status:  Full code Family Communication:   DVT Prophylaxis:Lovenox  Time spent:50 min  Kery Batzel, MD [facility]  If 7PM-7AM, please contact night-coverage [email or web address] 04/16/2015, 10:58 AM

## 2015-04-16 NOTE — ED Notes (Addendum)
Per EMS, pt began having CP yesterday afternoon around 1600. Pt has recent hx of pancreatitis and has not been taking his brilinta. Pt had stent placed in 2015. This morning the pt began having left sided chest pain with left arm numbness with nausea. EMS gave 3 nitro and  of zofran with 324 ASA. CP now 7/10 after nitro admin. 12 lead unremarkable. Pt ambulatory from stretcher to bed. Pt alert and oriented x 4.

## 2015-04-16 NOTE — Consult Note (Signed)
Cross cover [facility] Reason for Consult: abdominal pain ?stricture in PD. Referring Physician: THP  Douglas [name] is an 50 y.o. male.  HPI: Douglas [name] is a 50 year old white male with multiple medical problems listed below including alcoholic pancreatitis, presented to the emergency room today with complaints of diffuse abdominal pain nausea and chest discomfort. Patient has a long-standing history of alcohol and tobacco abuse he claims he quit drinking a week ago. He's been smoking a pack a day since age of 3. In addition to the abdominal pain he's had some left-sided chest pain. He was recently discharged from the hospital last Monday when he was given Percocet to take on a when necessary basis he consumed a temperature that he was given within 24 hours and has been without narcotics since then. He gives a history of following a liquid diet for the last couple of days to help with the symptoms but as his symptoms worsen over the last 24 hours. Decided to come to the emergency room. He's had some numbness and tingling in his left arm associated with a left-sided chest pain he denies shortness of breath or dyspnea on exertion. There is no history of diaphoresis or palpitations. He claims he had severe nausea and vomited twice last night but denies any hematemesis or coffee-ground emesis he has a history of occasional rectal bleeding that he feels is from his hemorrhoids. He denies a history of a tiny aphasia or dysphasia there is no history of melena. He is auricular by our GI office by Lynnda Shields PA-C. On an MRCP done today he was found to have improvement in the pancreatic edema and reduction in the distal stricture of the common bile duct consistent consistent with improvement in his pancreatitis.  However there is a persistent rounded signal abnormality inferior to the ampulla with within the pancreatic head concerning for pancreatic adenocarcinoma and an endoscopic evaluation was recommended.  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  Past Medical History  Diagnosis Date  . Tobacco abuse   . Coronary artery disease   . Anginal pain ([place])   . GERD (gastroesophageal reflux disease)   . NSTEMI (non-ST elevated myocardial infarction) ([place]) 01/2014  . Pancreatitis   . PTSD (post-traumatic stress disorder)    Past Surgical History  Procedure Laterality Date  . Coronary angioplasty with stent placement  01/18/2014    "1"  . Left heart catheterization with coronary angiogram N/A 01/18/2014    Procedure: LEFT HEART CATHETERIZATION WITH CORONARY ANGIOGRAM;  Surgeon: Lorretta Harp, MD;  Location: [facility] CATH LAB;  Service: Cardiovascular;  Laterality: N/A;   Family History  Problem Relation Age of Onset  . Cancer Mother     Lung cancer  . Coronary artery disease Mother   . Heart failure Maternal Grandmother    Social History:  reports that he has been smoking Cigarettes.  He has a 31 pack-year smoking history. He has never used smokeless tobacco. He reports that he does not drink alcohol or use illicit drugs.  Allergies:  Allergies  Allergen Reactions  . Ibuprofen Other (See Comments)    Upset stomach  . Shellfish Allergy Nausea And Vomiting   Medications: I have reviewed the patient's current medications.  Results for orders placed or performed during the hospital encounter of 04/16/15 (from the past 48 hour(s))  Basic metabolic panel     Status: Abnormal   Collection Time: 04/16/15  7:05 AM  Result Value Ref Range   Sodium 142 135 - 145  mmol/L   Potassium 4.1 3.5 - 5.1 mmol/L   Chloride 107 101 - 111 mmol/L   CO2 24 22 - 32 mmol/L   Glucose, Bld 107 (H) 65 - 99 mg/dL   BUN 7 6 - 20 mg/dL   Creatinine, Ser 1.02 0.61 - 1.24 mg/dL   Calcium 8.6 (L) 8.9 - 10.3 mg/dL   GFR calc non Af Amer >60 >60 mL/min   GFR calc Af Amer >60 >60 mL/min    Comment: (NOTE) The eGFR has been calculated using the CKD EPI equation. This calculation has not been validated in all clinical  situations. eGFR's persistently <60 mL/min signify possible Chronic Kidney Disease.    Anion gap 11 5 - 15  CBC     Status: None   Collection Time: 04/16/15  7:05 AM  Result Value Ref Range   WBC 8.0 4.0 - 10.5 K/uL   RBC 4.81 4.22 - 5.81 MIL/uL   Hemoglobin 16.2 13.0 - 17.0 g/dL   HCT 47.3 39.0 - 52.0 %   MCV 98.3 78.0 - 100.0 fL   MCH 33.7 26.0 - 34.0 pg   MCHC 34.2 30.0 - 36.0 g/dL   RDW 13.1 11.5 - 15.5 %   Platelets 319 150 - 400 K/uL  Lipase, blood     Status: Abnormal   Collection Time: 04/16/15  7:05 AM  Result Value Ref Range   Lipase 59 (H) 11 - 51 U/L  Hepatic function panel     Status: Abnormal   Collection Time: 04/16/15  7:05 AM  Result Value Ref Range   Total Protein 5.7 (L) 6.5 - 8.1 g/dL   Albumin 3.1 (L) 3.5 - 5.0 g/dL   AST 29 15 - 41 U/L   ALT 22 17 - 63 U/L   Alkaline Phosphatase 98 38 - 126 U/L   Total Bilirubin 0.5 0.3 - 1.2 mg/dL   Bilirubin, Direct 0.2 0.1 - 0.5 mg/dL   Indirect Bilirubin 0.3 0.3 - 0.9 mg/dL  I-stat troponin, ED (not at [facility])     Status: None   Collection Time: 04/16/15  7:08 AM  Result Value Ref Range   Troponin i, poc 0.01 0.00 - 0.08 ng/mL   Comment 3            Comment: Due to the release kinetics of cTnI, a negative result within the first hours of the onset of symptoms does not rule out myocardial infarction with certainty. If myocardial infarction is still suspected, repeat the test at appropriate intervals.   POC occult blood, ED Provider will collect     Status: None   Collection Time: 04/16/15  8:19 AM  Result Value Ref Range   Fecal Occult Bld NEGATIVE NEGATIVE  CBC     Status: None   Collection Time: 04/16/15 10:23 AM  Result Value Ref Range   WBC 7.8 4.0 - 10.5 K/uL   RBC 4.69 4.22 - 5.81 MIL/uL   Hemoglobin 15.6 13.0 - 17.0 g/dL   HCT 46.2 39.0 - 52.0 %   MCV 98.5 78.0 - 100.0 fL   MCH 33.3 26.0 - 34.0 pg   MCHC 33.8 30.0 - 36.0 g/dL   RDW 13.2 11.5 - 15.5 %   Platelets 294 150 - 400 K/uL   Dg  Chest 2 View  04/16/2015  CLINICAL DATA:  Acute left-sided chest pain. EXAM: CHEST  2 VIEW COMPARISON:  April 06, 2015. FINDINGS: The heart size and mediastinal contours are within normal limits. No  pneumothorax or pleural effusion is noted. Stable calcified granuloma is noted in right upper lobe. No acute pulmonary disease noted. The visualized skeletal structures are unremarkable. IMPRESSION: No active cardiopulmonary disease. Electronically Signed   By: Marijo Conception, M.D.   On: 04/16/2015 07:58   Review of Systems  Constitutional: Negative.   HENT: Negative.   Eyes: Negative.   Respiratory: Negative.   Cardiovascular: Positive for chest pain. Negative for palpitations, orthopnea, claudication and leg swelling.  Gastrointestinal: Positive for heartburn, nausea, vomiting and abdominal pain. Negative for diarrhea, constipation, blood in stool and melena.  Skin: Negative.   Neurological: Negative.   Endo/Heme/Allergies: Negative.   Psychiatric/Behavioral: Positive for substance abuse. Negative for depression and hallucinations. The patient is nervous/anxious. The patient does not have insomnia.    Blood pressure 158/105, pulse 93, temperature 98.1 F (36.7 C), temperature source Oral, resp. rate 16, height '5\' 10"'  (1.778 m), weight 64.864 kg (143 lb), SpO2 97 %. Physical Exam  Constitutional: He is oriented to person, place, and time. He appears well-developed and well-nourished.  HENT:  Head: Normocephalic and atraumatic.  Eyes: Conjunctivae and EOM are normal. Pupils are equal, round, and reactive to light.  Neck: Normal range of motion. Neck supple.  Cardiovascular: Normal rate and regular rhythm.   Respiratory: Breath sounds normal.  GI: Soft. He exhibits distension. He exhibits no mass. There is tenderness. There is no rebound.  Musculoskeletal: Normal range of motion.  Neurological: He is alert and oriented to person, place, and time.  Skin: Skin is warm and dry.  Psychiatric: He  has a normal mood and affect. His behavior is normal. Judgment and thought content normal.   Assessment/Plan: 1) Alcoholic pancreatitis-recurrent abdominal pain-complicated with some MRCP finding concerning for pancreatic adenocarcinoma. Endoscopic ultrasound will be planned early next week. 2) Alcohol abuse.  3) History of NSTEMI/CAD/unstable angina.  4) History of narcotic abuse.  5) PSTD. 6) tobacco abuse 32-pack-year history of smoking. Josanne Boerema 04/16/2015, 11:59 AM

## 2015-04-16 NOTE — ED Provider Notes (Signed)
CSN: [id number]     Arrival date & time 04/16/15  5621 History   First MD Initiated Contact with Patient 04/16/15 [phone]     Chief Complaint  Patient presents with  . Chest Pain  . Abdominal Pain     (Consider location/radiation/quality/duration/timing/severity/associated sxs/prior Treatment) HPI Complains of anterior chest pain nonradiating onset 2 days ago, waxes and wanes, feels like "heart pain he's had in the past" that led him to get a coronary stent in 2015. He's been noncompliant with all cardiology follow-up since having gotten his stent. He also complains of epigastric pain radiating to the back which feels like pancreatitis she's had in the past onset 2 or 3 days ago. He admits to drinking alcohol 3 days ago. He had multiple episodes of vomiting last night and also had bowel movement mixed with blood this morning.Marland Kitchen He was treated by EMS with aspirin and 3 sublingual nitroglycerin with complete relief of chest discomfort. Abdominal pain is worse since drinking alcohol., Constant. Associated symptoms presently include nausea No other associated symptoms Past Medical History  Diagnosis Date  . Tobacco abuse   . Coronary artery disease   . Anginal pain (HCC)   . GERD (gastroesophageal reflux disease)   . NSTEMI (non-ST elevated myocardial infarction) (HCC) 01/2014  . Pancreatitis   . PTSD (post-traumatic stress disorder)    Past Surgical History  Procedure Laterality Date  . Coronary angioplasty with stent placement  01/18/2014    "1"  . Left heart catheterization with coronary angiogram N/A 01/18/2014    Procedure: LEFT HEART CATHETERIZATION WITH CORONARY ANGIOGRAM;  Surgeon: Runell Gess, MD;  Location: [facility] CATH LAB;  Service: Cardiovascular;  Laterality: N/A;   Family History  Problem Relation Age of Onset  . Cancer Mother     Lung cancer  . Coronary artery disease Mother   . Heart failure Maternal Grandmother    Social History  Substance Use Topics  . Smoking status:  Current Every Day Smoker -- 1.00 packs/day for 31 years    Types: Cigarettes  . Smokeless tobacco: Never Used  . Alcohol Use: No     Comment: Last use 03/13/15    Review of Systems  Cardiovascular: Positive for chest pain.  Gastrointestinal: Positive for nausea, vomiting, abdominal pain and blood in stool.  All other systems reviewed and are negative.     Allergies  Ibuprofen and Shellfish allergy  Home Medications   Prior to Admission medications   Medication Sig Start Date End Date Taking? Authorizing Provider  aspirin EC 81 MG tablet Take 1 tablet (81 mg total) by mouth daily. 04/09/15  Yes Albertine Grates, MD  Aspirin-Salicylamide-Caffeine [facility] HEADACHE POWDER PO) Take 1 packet by mouth daily as needed (pain).   Yes Historical Provider, MD  HYDROcodone-acetaminophen (NORCO/VICODIN) 5-325 MG tablet Take 1 tab every 6-8 hours as needed for pain 04/09/15  Yes Albertine Grates, MD  metoprolol tartrate (LOPRESSOR) 25 MG tablet Take 0.5 tablets (12.5 mg total) by mouth 2 (two) times daily. 04/09/15  Yes Albertine Grates, MD  pantoprazole (PROTONIX) 40 MG tablet Take 1 tab by mouth every morning. 03/28/15  Yes Amy S Esterwood, PA-C   BP 151/91 mmHg  Pulse 90  Temp(Src) 98.1 F (36.7 C) (Oral)  Resp 16  Ht  (1.778 m)  Wt 143 lb (64.864 kg)  BMI 20.52 kg/m2  SpO2 100% Physical Exam  Constitutional: He appears well-developed and well-nourished.  HENT:  Head: Normocephalic and atraumatic.  Eyes: Conjunctivae are normal. Pupils  are equal, round, and reactive to light.  Neck: Neck supple. No tracheal deviation present. No thyromegaly present.  Cardiovascular: Normal rate and regular rhythm.   No murmur heard. Pulmonary/Chest: Effort normal and breath sounds normal.  Abdominal: Soft. Bowel sounds are normal. He exhibits no distension. There is no tenderness.  Genitourinary: Rectum normal. Guaiac negative stool.  Musculoskeletal: Normal range of motion. He exhibits no edema or tenderness.  Neurological:  He is alert. Coordination normal.  Skin: Skin is warm and dry. No rash noted.  Psychiatric: He has a normal mood and affect.  Nursing note and vitals reviewed.   ED Course  Procedures (including critical care time) Labs Review Labs Reviewed  BASIC METABOLIC PANEL - Abnormal; Notable for the following:    Glucose, Bld 107 (*)    Calcium 8.6 (*)    All other components within normal limits  CBC  LIPASE, BLOOD  HEPATIC FUNCTION PANEL  I-STAT TROPOININ, ED  POC OCCULT BLOOD, ED  Rosezena Sensor, ED    Imaging Review Dg Chest 2 View  04/16/2015  CLINICAL DATA:  Acute left-sided chest pain. EXAM: CHEST  2 VIEW COMPARISON:  April 06, 2015. FINDINGS: The heart size and mediastinal contours are within normal limits. No pneumothorax or pleural effusion is noted. Stable calcified granuloma is noted in right upper lobe. No acute pulmonary disease noted. The visualized skeletal structures are unremarkable. IMPRESSION: No active cardiopulmonary disease. Electronically Signed   By: Lupita Raider, M.D.   On: 04/16/2015 07:58   I have personally reviewed and evaluated these images and lab results as part of my medical decision-making.   EKG Interpretation   Date/Time:  Saturday April 16 2015 06:56:01 EST Ventricular Rate:  91 PR Interval:  139 QRS Duration: 80 QT Interval:  388 QTC Calculation: 477 R Axis:   81 Text Interpretation:  Sinus rhythm Borderline prolonged QT interval No  significant change since last tracing Confirmed by Ethelda Chick  MD, Khary Schaben  ([id number]) on 04/16/2015 7:28:18 AM     10 AM patient resting comfortably and feels improved after treatment with  intravenous opioids and antiemetics Results for orders placed or performed during the hospital encounter of 04/16/15  Basic metabolic panel  Result Value Ref Range   Sodium 142 135 - 145 mmol/L   Potassium 4.1 3.5 - 5.1 mmol/L   Chloride 107 101 - 111 mmol/L   CO2 24 22 - 32 mmol/L   Glucose, Bld 107 (H) 65 - 99 mg/dL    BUN 7 6 - 20 mg/dL   Creatinine, Ser 4.09 0.61 - 1.24 mg/dL   Calcium 8.6 (L) 8.9 - 10.3 mg/dL   GFR calc non Af Amer >60 >60 mL/min   GFR calc Af Amer >60 >60 mL/min   Anion gap 11 5 - 15  CBC  Result Value Ref Range   WBC 8.0 4.0 - 10.5 K/uL   RBC 4.81 4.22 - 5.81 MIL/uL   Hemoglobin 16.2 13.0 - 17.0 g/dL   HCT 81.1 91.4 - 78.2 %   MCV 98.3 78.0 - 100.0 fL   MCH 33.7 26.0 - 34.0 pg   MCHC 34.2 30.0 - 36.0 g/dL   RDW 95.6 21.3 - 08.6 %   Platelets 319 150 - 400 K/uL  Lipase, blood  Result Value Ref Range   Lipase 59 (H) 11 - 51 U/L  Hepatic function panel  Result Value Ref Range   Total Protein 5.7 (L) 6.5 - 8.1 g/dL   Albumin 3.1 (L)  3.5 - 5.0 g/dL   AST 29 15 - 41 U/L   ALT 22 17 - 63 U/L   Alkaline Phosphatase 98 38 - 126 U/L   Total Bilirubin 0.5 0.3 - 1.2 mg/dL   Bilirubin, Direct 0.2 0.1 - 0.5 mg/dL   Indirect Bilirubin 0.3 0.3 - 0.9 mg/dL  I-stat troponin, ED (not at [facility], [facility])  Result Value Ref Range   Troponin i, poc 0.01 0.00 - 0.08 ng/mL   Comment 3          POC occult blood, ED Provider will collect  Result Value Ref Range   Fecal Occult Bld NEGATIVE NEGATIVE   Dg Chest 2 View  04/16/2015  CLINICAL DATA:  Acute left-sided chest pain. EXAM: CHEST  2 VIEW COMPARISON:  April 06, 2015. FINDINGS: The heart size and mediastinal contours are within normal limits. No pneumothorax or pleural effusion is noted. Stable calcified granuloma is noted in right upper lobe. No acute pulmonary disease noted. The visualized skeletal structures are unremarkable. IMPRESSION: No active cardiopulmonary disease. Electronically Signed   By: Lupita Raider, M.D.   On: 04/16/2015 07:58   Dg Chest 2 View  03/22/2015  CLINICAL DATA:  Left side chest pain for few days with worsening EXAM: CHEST  2 VIEW COMPARISON:  01/18/2014 FINDINGS: Cardiomediastinal silhouette is stable. No acute infiltrate or pleural effusion. No pulmonary edema. Bony thorax is unremarkable. IMPRESSION: No active  cardiopulmonary disease. Electronically Signed   By: Natasha Mead M.D.   On: 03/22/2015 10:49   Dg Ribs Unilateral W/chest Left  03/24/2015  CLINICAL DATA:  50 year old male with fall and left rib pain. EXAM: LEFT RIBS AND CHEST - 3+ VIEW COMPARISON:  Chest radiograph dated 03/22/2015 FINDINGS: There is nondisplaced fracture of the lateral left 8th rib. No pneumothorax. The lungs are clear. A stable 4 mm right upper lobe calcific nodule, likely a granuloma. The soft tissues appear unremarkable. IMPRESSION: Nondisplaced fracture of the lateral aspect of the left eighth rib. No pneumothorax. Electronically Signed   By: Elgie Collard M.D.   On: 03/24/2015 23:46   Dg Abd Acute W/chest  04/06/2015  CLINICAL DATA:  Rib pain, twisted run at work, generalized abdominal pain radiating around to back, constipation, history coronary artery disease, smoking, GERD, fatty liver EXAM: DG ABDOMEN ACUTE W/ 1V CHEST COMPARISON:  Chest radiograph 03/24/2015 FINDINGS: Normal heart size, mediastinal contours, and pulmonary vascularity. Emphysematous and bronchitic changes question COPD. Calcified granuloma RIGHT upper lobe. No definite pulmonary infiltrate, pleural effusion, or pneumothorax. Bowel gas pattern normal. No bowel dilatation, bowel wall thickening or free intraperitoneal air. Osseous structures normal. No urinary tract calcification. IMPRESSION: COPD changes without acute infiltrate. Electronically Signed   By: Ulyses Southward M.D.   On: 04/06/2015 09:52    MDM  Patient should be watched for alcohol withdrawal. Suggest cardiology consult as patient has been noncompliant with cardiology follow-up and has known coronary disease and a coronary stent. He's having anginal type pain at rest Spoke with Dr.Rizwan plan 23 hour observation telemetry, nothing by mouth, IV hydration. Final diagnoses:  None   Dx #1 chest pain #2 epigastric pain #3 alcohol abuse #4 tobacco abuse  Doug Sou, MD 04/16/15 1009

## 2015-04-17 DIAGNOSIS — K219 Gastro-esophageal reflux disease without esophagitis: Secondary | ICD-10-CM | POA: Diagnosis present

## 2015-04-17 DIAGNOSIS — Z720 Tobacco use: Secondary | ICD-10-CM | POA: Diagnosis not present

## 2015-04-17 DIAGNOSIS — I252 Old myocardial infarction: Secondary | ICD-10-CM | POA: Diagnosis not present

## 2015-04-17 DIAGNOSIS — K760 Fatty (change of) liver, not elsewhere classified: Secondary | ICD-10-CM | POA: Diagnosis not present

## 2015-04-17 DIAGNOSIS — Z9119 Patient's noncompliance with other medical treatment and regimen: Secondary | ICD-10-CM | POA: Diagnosis not present

## 2015-04-17 DIAGNOSIS — K869 Disease of pancreas, unspecified: Secondary | ICD-10-CM | POA: Diagnosis present

## 2015-04-17 DIAGNOSIS — F17210 Nicotine dependence, cigarettes, uncomplicated: Secondary | ICD-10-CM | POA: Diagnosis present

## 2015-04-17 DIAGNOSIS — I25119 Atherosclerotic heart disease of native coronary artery with unspecified angina pectoris: Secondary | ICD-10-CM | POA: Diagnosis present

## 2015-04-17 DIAGNOSIS — F1010 Alcohol abuse, uncomplicated: Secondary | ICD-10-CM | POA: Diagnosis present

## 2015-04-17 DIAGNOSIS — K700 Alcoholic fatty liver: Secondary | ICD-10-CM | POA: Diagnosis present

## 2015-04-17 DIAGNOSIS — K860 Alcohol-induced chronic pancreatitis: Secondary | ICD-10-CM | POA: Diagnosis not present

## 2015-04-17 DIAGNOSIS — R100 Acute abdomen: Secondary | ICD-10-CM | POA: Diagnosis not present

## 2015-04-17 DIAGNOSIS — R1011 Right upper quadrant pain: Secondary | ICD-10-CM | POA: Diagnosis not present

## 2015-04-17 DIAGNOSIS — K7689 Other specified diseases of liver: Secondary | ICD-10-CM | POA: Diagnosis not present

## 2015-04-17 DIAGNOSIS — Z955 Presence of coronary angioplasty implant and graft: Secondary | ICD-10-CM | POA: Diagnosis not present

## 2015-04-17 DIAGNOSIS — Z79899 Other long term (current) drug therapy: Secondary | ICD-10-CM | POA: Diagnosis not present

## 2015-04-17 DIAGNOSIS — R0789 Other chest pain: Secondary | ICD-10-CM | POA: Diagnosis not present

## 2015-04-17 DIAGNOSIS — R079 Chest pain, unspecified: Secondary | ICD-10-CM | POA: Diagnosis present

## 2015-04-17 MED ORDER — SODIUM CHLORIDE 0.9 % IV SOLN
INTRAVENOUS | Status: DC
  Administered 2015-04-17 – 2015-04-19 (×4): via INTRAVENOUS

## 2015-04-17 NOTE — Progress Notes (Signed)
Cross cover [facility] Subjective: Since I last evaluated the patient, he seems to be doing well. His abdominal pain is somewhat better. He denies having any nausea vomiting fever chills or rigors.  Objective: Vital signs in last 24 hours: Temp:  [97.5 F (36.4 C)-98.2 F (36.8 C)] 97.5 F (36.4 C) (02/05 0348) Pulse Rate:  [63-103] 70 (02/05 0445) Resp:  [18-22] 18 (02/05 0445) BP: (158-184)/(80-115) 160/80 mmHg (02/05 0445) SpO2:  [96 %-100 %] 99 % (02/05 0445) Weight:  [65.5 kg (144 lb 6.4 oz)-66 kg (145 lb 8.1 oz)] 66 kg (145 lb 8.1 oz) (02/05 0348) Last BM Date: 04/16/15  Intake/Output from previous day: 02/04 0701 - 02/05 0700 In: 1200 [I.V.:1200] Out: 875 [Urine:875] Intake/Output this shift:   General appearance: alert, cooperative, appears stated age, fatigued and no distress Resp: clear to auscultation bilaterally Cardio: regular rate and rhythm, S1, S2 normal, no murmur, click, rub or gallop GI: soft, diffusely tender on palpation with minimal gaurding but without rebound or rigidity; bowel sounds are hypoactive, no masses,  no organomegaly Extremities: extremities normal, atraumatic, no cyanosis or edema  Lab Results:  Recent Labs  04/16/15 0705 04/16/15 1023  WBC 8.0 7.8  HGB 16.2 15.6  HCT 47.3 46.2  PLT 319 294   BMET  Recent Labs  04/16/15 0705 04/16/15 1023  NA 142  --   K 4.1  --   CL 107  --   CO2 24  --   GLUCOSE 107*  --   BUN 7  --   CREATININE 1.02 0.96  CALCIUM 8.6*  --    LFT  Recent Labs  04/16/15 0705  PROT 5.7*  ALBUMIN 3.1*  AST 29  ALT 22  ALKPHOS 98  BILITOT 0.5  BILIDIR 0.2  IBILI 0.3   Studies/Results: Dg Chest 2 View  04/16/2015  CLINICAL DATA:  Acute left-sided chest pain. EXAM: CHEST  2 VIEW COMPARISON:  April 06, 2015. FINDINGS: The heart size and mediastinal contours are within normal limits. No pneumothorax or pleural effusion is noted. Stable calcified granuloma is noted in right upper lobe. No acute pulmonary  disease noted. The visualized skeletal structures are unremarkable. IMPRESSION: No active cardiopulmonary disease. Electronically Signed   By: Lupita Raider, M.D.   On: 04/16/2015 07:58   US Abdomen Complete  04/16/2015  CLINICAL DATA:  Patient with abdominal pain and history pancreatitis. EXAM: ABDOMEN ULTRASOUND COMPLETE COMPARISON:  MRI abdomen 03/07/2015 FINDINGS: Gallbladder: No gallstones or wall thickening visualized. No sonographic Murphy sign noted by sonographer. Common bile duct: Diameter: 6 mm Liver: No focal lesion identified. Within normal limits in parenchymal echogenicity. IVC: No abnormality visualized. Pancreas: Visualized portion unremarkable. Spleen: Size and appearance within normal limits. Right Kidney: Length: 11.3 cm. Echogenicity within normal limits. No mass or hydronephrosis visualized. Left Kidney: Length: 11.1 cm. Echogenicity within normal limits. No mass or hydronephrosis visualized. Abdominal aorta: No aneurysm visualized. Other findings: None. IMPRESSION: Common bile duct upper limits of normal. Recommend correlation with LFTs. No cholelithiasis or sonographic evidence for acute cholecystitis. No hydronephrosis. Electronically Signed   By: Annia Belt M.D.   On: 04/16/2015 14:09   Mr Abdomen Mrcp Wo Cm  04/16/2015  CLINICAL DATA:  Acute pancreatitis. Follow-up pancreatitis. Persistent pain. EXAM: MRI ABDOMEN WITHOUT CONTRAST  (INCLUDING MRCP) TECHNIQUE: Multiplanar multisequence MR imaging of the abdomen was performed. Heavily T2-weighted images of the biliary and pancreatic ducts were obtained, and three-dimensional MRCP images were rendered by post processing. COMPARISON:  MRI 03/07/2015,  CT 03/05/2015 FINDINGS: Lower chest:  Lung bases are clear. Hepatobiliary: No intrahepatic biliary duct dilatation. No focal hepatic lesion. No hepatic steatosis. There is mild extrahepatic duct dilatation. No intrahepatic duct dilatation. The extrahepatic duct dilatation is decreased when  compared to prior. The common hepatic duct measures 7 mm compared to 8 mm. The common bile duct measures 7 mm compared to 10 mm (image 21-17 of series 5). There is relief of the distal stricturing of the common bile duct leading up to the ampulla. No filling defect within the common bile duct. The gallbladder is normal. Again demonstrated variant pancreatic ductal anatomy with Pancreas: The main pancreatic duct drains into the accessory duct superior to the ampulla (ductus divisum) .no ductal dilatation. There is mild inflammation with uncinate process of the pancreas with high signal intensity on T2 weighted imaging (image 32, series 6). On the T1 weighted imaging there is a low-attenuation rounded region within the head of the pancreas inferior to the ampulla measuring 2.7 by 2.5 cm (image 84, series 17). This is more subtly seen on series 18, series 71. No IV contrast was administered. Spleen: Normal spleen. Adrenals/urinary tract: Adrenal glands and kidneys are normal. Stomach/Bowel: Stomach and limited of the small bowel is unremarkable Vascular/Lymphatic: Abdominal aortic normal caliber. No retroperitoneal periportal lymphadenopathy. Musculoskeletal: No aggressive osseous lesion IMPRESSION: 1. Improvement in pancreatic edema and reduction and distal stricturing of the common bile duct consists with improved pancreatitis. 2. There is however, persistent rounded signal abnormality inferior to the ampulla within the pancreatic head. Cannot exclude PANCREATIC ADENOCARCINOMA. Recommend endoscopic ultrasound further evaluation. Alternative follow-up MRI with IV contrast in 3 months could be considered. 3. Pancreatic ductal ductus divisum anatomy. 4. Interval decrease in mild dilatation of the common bile duct seen on comparison exam. Electronically Signed   By: Genevive Bi M.D.   On: 04/16/2015 15:04   Mr 3d Recon At Scanner  04/16/2015  CLINICAL DATA:  Acute pancreatitis. Follow-up pancreatitis. Persistent  pain. EXAM: MRI ABDOMEN WITHOUT CONTRAST  (INCLUDING MRCP) TECHNIQUE: Multiplanar multisequence MR imaging of the abdomen was performed. Heavily T2-weighted images of the biliary and pancreatic ducts were obtained, and three-dimensional MRCP images were rendered by post processing. COMPARISON:  MRI 03/07/2015, CT 03/05/2015 FINDINGS: Lower chest:  Lung bases are clear. Hepatobiliary: No intrahepatic biliary duct dilatation. No focal hepatic lesion. No hepatic steatosis. There is mild extrahepatic duct dilatation. No intrahepatic duct dilatation. The extrahepatic duct dilatation is decreased when compared to prior. The common hepatic duct measures 7 mm compared to 8 mm. The common bile duct measures 7 mm compared to 10 mm (image 21-17 of series 5). There is relief of the distal stricturing of the common bile duct leading up to the ampulla. No filling defect within the common bile duct. The gallbladder is normal. Again demonstrated variant pancreatic ductal anatomy with Pancreas: The main pancreatic duct drains into the accessory duct superior to the ampulla (ductus divisum) .no ductal dilatation. There is mild inflammation with uncinate process of the pancreas with high signal intensity on T2 weighted imaging (image 32, series 6). On the T1 weighted imaging there is a low-attenuation rounded region within the head of the pancreas inferior to the ampulla measuring 2.7 by 2.5 cm (image 84, series 17). This is more subtly seen on series 18, series 71. No IV contrast was administered. Spleen: Normal spleen. Adrenals/urinary tract: Adrenal glands and kidneys are normal. Stomach/Bowel: Stomach and limited of the small bowel is unremarkable Vascular/Lymphatic: Abdominal aortic normal caliber.  No retroperitoneal periportal lymphadenopathy. Musculoskeletal: No aggressive osseous lesion IMPRESSION: 1. Improvement in pancreatic edema and reduction and distal stricturing of the common bile duct consists with improved  pancreatitis. 2. There is however, persistent rounded signal abnormality inferior to the ampulla within the pancreatic head. Cannot exclude PANCREATIC ADENOCARCINOMA. Recommend endoscopic ultrasound further evaluation. Alternative follow-up MRI with IV contrast in 3 months could be considered. 3. Pancreatic ductal ductus divisum anatomy. 4. Interval decrease in mild dilatation of the common bile duct seen on comparison exam. Electronically Signed   By: Genevive Bi M.D.   On: 04/16/2015 15:04   Medications: I have reviewed the patient's current medications.  Assessment/Plan: 1) Abdominal pain with history of multiple episodes of recurrent alcoholic pancreatitis now with 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 EUS to be done this week. Will check a CA 19-9 level.  2) Pancreas divisum.  3) Hepatic steatosis.  4) History of coronary artery disease/unstable angina/non-STEMI- November 2015 when a drug eluting RCA stent was placed by Dr. Clarise Cruz.    Damaree Sargent 04/17/2015, 8:54 AM

## 2015-04-17 NOTE — Progress Notes (Signed)
[facility] Progress Note   Douglas [name]  ZOX:[id number]  DOB: November 07, 1965  DOA: 04/16/2015 PCP: No PCP Per Patient  Brief narrative: Douglas [name] is a 50 y.o. male of alcohol and tobacco abuse, and STEMI status post stent in November 2015, recent acute pancreatitis secondary to alcohol use who returns with a complaint of left-sided chest pain. Patient states that after being discharged from the hospital last Monday, overall he was doing relatively well. He states that he was given a prescription for 10 tablets of Percocet and he used all of these tablets within 24 hours. He also complains of upper abdominal pain and right upper quadrant pain. He has been vomiting at home. He states that he's only had 2 beers this past week. On exam he was noted to be tender in the epigastrium and right upper quadrant. Lipase was only mildly elevated.   Subjective: States that chest pain has resolved but continues to have abdominal discomfort and states that narcotics aren't helping. No complaint of nausea or diarrhea. No fever or chills.  Assessment/Plan: Chest pain with underlying coronary artery disease -Post stenting of RCA in November, 2015 who was also noted to have moderate disease involving the LAD and diagonal branch -He has not followed up with a cardiologist since the stent was placed - I am wondering that that he may be drinking more than he admits and the resultant vomiting may be causing his chest discomfort -Cardiac enzymes have been negative-there has been no recurrence of pain -I have resumed Brillinta in case this is unstable angina (can be stopped if pain does not recur)- he states that he was only able to take it for 2 months as he was not able to afford it  Active Problems: Vomiting with right upper quadrant pain - multiple admissions recently for acute pancreatitis- lipase mildly elevated on admission - Upon reviewing prior imaging, it is noted that there was a suspicion  of distal common bile duct narrowing on an MRCP in December of last year. He also had type I pancreatic divisum. -EP MRCP reveals improvement in pancreatic edema and reduction in the distal stricture in the common bile duct however, there is a persistent rounded density near the ampulla and is difficult to rule out pancreatic adenocarcinoma-EUS is planned for early next week-will order a CA-19-9 -Clear liquids only for now-monitor for vomiting- when necessary Zofran and low-dose morphine ordered   Tobacco abuse -Continue nicotine patch   Fatty liver -Likely related to alcohol abuse  Alcohol abuse -CIWA scale ordered- follow for withdrawal symptoms   Antibiotics: Anti-infectives    None     Code Status:     Code Status Orders        Start     Ordered   04/16/15 1047  Full code   Continuous     04/16/15 1047    Code Status History    Date Active Date Inactive Code Status Order ID Comments User Context   04/06/2015 11:57 AM 04/10/2015  4:17 PM Full Code [id number]  Maryruth Bun Rama, MD ED   03/05/2015  2:43 PM 03/10/2015  5:35 PM Full Code [id number]  Penny Pia, MD Inpatient   01/18/2014  5:14 PM 01/19/2014  5:29 PM Full Code [id number]  Runell Gess, MD Inpatient   01/18/2014  5:59 AM 01/18/2014  5:14 PM Full Code [id number]  Othella Boyer, MD ED     Family Communication:  Disposition Plan: EUS next week DVT prophylaxis: Enoxaparin Consultants: GI Procedures:  Objective: Filed Weights   04/16/15 0652 04/16/15 1500 04/17/15 0348  Weight: 64.864 kg (143 lb) 65.5 kg (144 lb 6.4 oz) 66 kg (145 lb 8.1 oz)    Intake/Output Summary (Last 24 hours) at 04/17/15 1106 Last data filed at 04/17/15 0900  Gross per 24 hour  Intake   1200 ml  Output   1275 ml  Net    -75 ml     Vitals Filed Vitals:   04/16/15 2202 04/17/15 0024 04/17/15 0348 04/17/15 0445  BP: 163/91 175/98 170/100 160/80  Pulse: 71 73 63 70  Temp:  97.9 F (36.6 C) 97.5 F (36.4 C)    TempSrc:  Oral Oral   Resp: 20 18 20 18   Height:      Weight:   66 kg (145 lb 8.1 oz)   SpO2: 98% 99% 97% 99%    Exam:  General:  Pt is alert, not in acute distress  HEENT: No icterus, No thrush, oral mucosa moist  Cardiovascular: regular rate and rhythm, S1/S2 No murmur  Respiratory: clear to auscultation bilaterally   Abdomen: Soft, +Bowel sounds, quite tender in right upper quadrant and mildly tender in epigastrium, non distended, no guarding  MSK: No cyanosis or clubbing- no pedal edema   Data Reviewed: Basic Metabolic Panel:  Recent Labs Lab 04/16/15 0705 04/16/15 1023  NA 142  --   K 4.1  --   CL 107  --   CO2 24  --   GLUCOSE 107*  --   BUN 7  --   CREATININE 1.02 0.96  CALCIUM 8.6*  --    Liver Function Tests:  Recent Labs Lab 04/16/15 0705  AST 29  ALT 22  ALKPHOS 98  BILITOT 0.5  PROT 5.7*  ALBUMIN 3.1*    Recent Labs Lab 04/16/15 0705  LIPASE 59*   No results for input(s): AMMONIA in the last 168 hours. CBC:  Recent Labs Lab 04/16/15 0705 04/16/15 1023  WBC 8.0 7.8  HGB 16.2 15.6  HCT 47.3 46.2  MCV 98.3 98.5  PLT 319 294   Cardiac Enzymes:  Recent Labs Lab 04/16/15 1023 04/16/15 1540 04/16/15 2247  TROPONINI <0.03 <0.03 <0.03   BNP (last 3 results) No results for input(s): BNP in the last 8760 hours.  ProBNP (last 3 results) No results for input(s): PROBNP in the last 8760 hours.  CBG: No results for input(s): GLUCAP in the last 168 hours.  No results found for this or any previous visit (from the past 240 hour(s)).   Studies: Dg Chest 2 View  04/16/2015  CLINICAL DATA:  Acute left-sided chest pain. EXAM: CHEST  2 VIEW COMPARISON:  April 06, 2015. FINDINGS: The heart size and mediastinal contours are within normal limits. No pneumothorax or pleural effusion is noted. Stable calcified granuloma is noted in right upper lobe. No acute pulmonary disease noted. The visualized skeletal structures are unremarkable.  IMPRESSION: No active cardiopulmonary disease. Electronically Signed   By: Lupita Raider, M.D.   On: 04/16/2015 07:58   US Abdomen Complete  04/16/2015  CLINICAL DATA:  Patient with abdominal pain and history pancreatitis. EXAM: ABDOMEN ULTRASOUND COMPLETE COMPARISON:  MRI abdomen 03/07/2015 FINDINGS: Gallbladder: No gallstones or wall thickening visualized. No sonographic Murphy sign noted by sonographer. Common bile duct: Diameter: 6 mm Liver: No focal lesion identified. Within normal limits in parenchymal echogenicity. IVC: No abnormality visualized. Pancreas: Visualized portion unremarkable. Spleen: Size and appearance within normal limits. Right Kidney: Length: 11.3 cm. Echogenicity  within normal limits. No mass or hydronephrosis visualized. Left Kidney: Length: 11.1 cm. Echogenicity within normal limits. No mass or hydronephrosis visualized. Abdominal aorta: No aneurysm visualized. Other findings: None. IMPRESSION: Common bile duct upper limits of normal. Recommend correlation with LFTs. No cholelithiasis or sonographic evidence for acute cholecystitis. No hydronephrosis. Electronically Signed   By: Annia Belt M.D.   On: 04/16/2015 14:09   Mr Abdomen Mrcp Wo Cm  04/16/2015  CLINICAL DATA:  Acute pancreatitis. Follow-up pancreatitis. Persistent pain. EXAM: MRI ABDOMEN WITHOUT CONTRAST  (INCLUDING MRCP) TECHNIQUE: Multiplanar multisequence MR imaging of the abdomen was performed. Heavily T2-weighted images of the biliary and pancreatic ducts were obtained, and three-dimensional MRCP images were rendered by post processing. COMPARISON:  MRI 03/07/2015, CT 03/05/2015 FINDINGS: Lower chest:  Lung bases are clear. Hepatobiliary: No intrahepatic biliary duct dilatation. No focal hepatic lesion. No hepatic steatosis. There is mild extrahepatic duct dilatation. No intrahepatic duct dilatation. The extrahepatic duct dilatation is decreased when compared to prior. The common hepatic duct measures 7 mm compared to  8 mm. The common bile duct measures 7 mm compared to 10 mm (image 21-17 of series 5). There is relief of the distal stricturing of the common bile duct leading up to the ampulla. No filling defect within the common bile duct. The gallbladder is normal. Again demonstrated variant pancreatic ductal anatomy with Pancreas: The main pancreatic duct drains into the accessory duct superior to the ampulla (ductus divisum) .no ductal dilatation. There is mild inflammation with uncinate process of the pancreas with high signal intensity on T2 weighted imaging (image 32, series 6). On the T1 weighted imaging there is a low-attenuation rounded region within the head of the pancreas inferior to the ampulla measuring 2.7 by 2.5 cm (image 84, series 17). This is more subtly seen on series 18, series 71. No IV contrast was administered. Spleen: Normal spleen. Adrenals/urinary tract: Adrenal glands and kidneys are normal. Stomach/Bowel: Stomach and limited of the small bowel is unremarkable Vascular/Lymphatic: Abdominal aortic normal caliber. No retroperitoneal periportal lymphadenopathy. Musculoskeletal: No aggressive osseous lesion IMPRESSION: 1. Improvement in pancreatic edema and reduction and distal stricturing of the common bile duct consists with improved pancreatitis. 2. There is however, persistent rounded signal abnormality inferior to the ampulla within the pancreatic head. Cannot exclude PANCREATIC ADENOCARCINOMA. Recommend endoscopic ultrasound further evaluation. Alternative follow-up MRI with IV contrast in 3 months could be considered. 3. Pancreatic ductal ductus divisum anatomy. 4. Interval decrease in mild dilatation of the common bile duct seen on comparison exam. Electronically Signed   By: Genevive Bi M.D.   On: 04/16/2015 15:04   Mr 3d Recon At Scanner  04/16/2015  CLINICAL DATA:  Acute pancreatitis. Follow-up pancreatitis. Persistent pain. EXAM: MRI ABDOMEN WITHOUT CONTRAST  (INCLUDING MRCP) TECHNIQUE:  Multiplanar multisequence MR imaging of the abdomen was performed. Heavily T2-weighted images of the biliary and pancreatic ducts were obtained, and three-dimensional MRCP images were rendered by post processing. COMPARISON:  MRI 03/07/2015, CT 03/05/2015 FINDINGS: Lower chest:  Lung bases are clear. Hepatobiliary: No intrahepatic biliary duct dilatation. No focal hepatic lesion. No hepatic steatosis. There is mild extrahepatic duct dilatation. No intrahepatic duct dilatation. The extrahepatic duct dilatation is decreased when compared to prior. The common hepatic duct measures 7 mm compared to 8 mm. The common bile duct measures 7 mm compared to 10 mm (image 21-17 of series 5). There is relief of the distal stricturing of the common bile duct leading up to the ampulla. No filling defect within  the common bile duct. The gallbladder is normal. Again demonstrated variant pancreatic ductal anatomy with Pancreas: The main pancreatic duct drains into the accessory duct superior to the ampulla (ductus divisum) .no ductal dilatation. There is mild inflammation with uncinate process of the pancreas with high signal intensity on T2 weighted imaging (image 32, series 6). On the T1 weighted imaging there is a low-attenuation rounded region within the head of the pancreas inferior to the ampulla measuring 2.7 by 2.5 cm (image 84, series 17). This is more subtly seen on series 18, series 71. No IV contrast was administered. Spleen: Normal spleen. Adrenals/urinary tract: Adrenal glands and kidneys are normal. Stomach/Bowel: Stomach and limited of the small bowel is unremarkable Vascular/Lymphatic: Abdominal aortic normal caliber. No retroperitoneal periportal lymphadenopathy. Musculoskeletal: No aggressive osseous lesion IMPRESSION: 1. Improvement in pancreatic edema and reduction and distal stricturing of the common bile duct consists with improved pancreatitis. 2. There is however, persistent rounded signal abnormality inferior  to the ampulla within the pancreatic head. Cannot exclude PANCREATIC ADENOCARCINOMA. Recommend endoscopic ultrasound further evaluation. Alternative follow-up MRI with IV contrast in 3 months could be considered. 3. Pancreatic ductal ductus divisum anatomy. 4. Interval decrease in mild dilatation of the common bile duct seen on comparison exam. Electronically Signed   By: Genevive Bi M.D.   On: 04/16/2015 15:04    Scheduled Meds:  Scheduled Meds: . aspirin EC  81 mg Oral Daily  . enoxaparin (LOVENOX) injection  40 mg Subcutaneous Q24H  . folic acid  1 mg Oral Daily  . metoprolol tartrate  12.5 mg Oral BID  . multivitamin with minerals  1 tablet Oral Daily  . nicotine  14 mg Transdermal Daily  . pantoprazole  40 mg Oral Daily  . thiamine  100 mg Oral Daily   Or  . thiamine  100 mg Intravenous Daily  . ticagrelor  90 mg Oral BID   Continuous Infusions: . sodium chloride 125 mL/hr at 04/17/15 0855    Time spent on care of this patient: 35 min   Douglas Meuth, MD 04/17/2015, 11:06 AM    [facility] Office  [phone] Pager - Text Page per [email or web address] If 7PM-7AM, please contact night-coverage [email or web address]

## 2015-04-18 ENCOUNTER — Telehealth: Payer: Self-pay | Admitting: Gastroenterology

## 2015-04-18 DIAGNOSIS — F17200 Nicotine dependence, unspecified, uncomplicated: Secondary | ICD-10-CM

## 2015-04-18 DIAGNOSIS — R109 Unspecified abdominal pain: Secondary | ICD-10-CM | POA: Insufficient documentation

## 2015-04-18 DIAGNOSIS — R100 Acute abdomen: Secondary | ICD-10-CM

## 2015-04-18 DIAGNOSIS — K7689 Other specified diseases of liver: Secondary | ICD-10-CM

## 2015-04-18 DIAGNOSIS — Q453 Other congenital malformations of pancreas and pancreatic duct: Secondary | ICD-10-CM

## 2015-04-18 DIAGNOSIS — K860 Alcohol-induced chronic pancreatitis: Secondary | ICD-10-CM

## 2015-04-18 DIAGNOSIS — Z720 Tobacco use: Secondary | ICD-10-CM

## 2015-04-18 DIAGNOSIS — K760 Fatty (change of) liver, not elsewhere classified: Secondary | ICD-10-CM

## 2015-04-18 DIAGNOSIS — K852 Alcohol induced acute pancreatitis without necrosis or infection: Secondary | ICD-10-CM

## 2015-04-18 DIAGNOSIS — K861 Other chronic pancreatitis: Secondary | ICD-10-CM

## 2015-04-18 DIAGNOSIS — K869 Disease of pancreas, unspecified: Secondary | ICD-10-CM

## 2015-04-18 LAB — BASIC METABOLIC PANEL
ANION GAP: 9 (ref 5–15)
BUN: <5 mg/dL — ABNORMAL LOW (ref 6–20)
CALCIUM: 8.2 mg/dL — AB (ref 8.9–10.3)
CHLORIDE: 105 mmol/L (ref 101–111)
CO2: 25 mmol/L (ref 22–32)
CREATININE: 0.83 mg/dL (ref 0.61–1.24)
GFR calc Af Amer: >60 mL/min (ref >60)
GFR calc non Af Amer: >60 mL/min (ref >60)
GLUCOSE: 84 mg/dL (ref 65–99)
Potassium: 3.8 mmol/L (ref 3.5–5.1)
Sodium: 139 mmol/L (ref 135–145)

## 2015-04-18 LAB — CBC
HEMATOCRIT: 44.4 % (ref 39.0–52.0)
HEMOGLOBIN: 15.1 g/dL (ref 13.0–17.0)
MCH: 33.4 pg (ref 26.0–34.0)
MCHC: 34 g/dL (ref 30.0–36.0)
MCV: 98.2 fL (ref 78.0–100.0)
Platelets: 232 10*3/uL (ref 150–400)
RBC: 4.52 MIL/uL (ref 4.22–5.81)
RDW: 12.6 % (ref 11.5–15.5)
WBC: 9 10*3/uL (ref 4.0–10.5)

## 2015-04-18 LAB — HEPATIC FUNCTION PANEL
ALBUMIN: 3 g/dL — AB (ref 3.5–5.0)
ALK PHOS: 91 U/L (ref 38–126)
ALT: 18 U/L (ref 17–63)
AST: 26 U/L (ref 15–41)
Bilirubin, Direct: <0.1 mg/dL — ABNORMAL LOW (ref 0.1–0.5)
TOTAL PROTEIN: 5.2 g/dL — AB (ref 6.5–8.1)
Total Bilirubin: 0.9 mg/dL (ref 0.3–1.2)

## 2015-04-18 LAB — LIPASE, BLOOD: Lipase: 32 U/L (ref 11–51)

## 2015-04-18 LAB — CANCER ANTIGEN 19-9: CA 19-9: 16 U/mL (ref 0–35)

## 2015-04-18 NOTE — Progress Notes (Signed)
     [facility] Progress Note  Subjective:  Feels ok.  Was admitted for elevated BP, tachycardia, and chest pain.  Has abdominal pain, but that has not resolved completely since previous admission; imagining shows improvement.  Already scheduled for outpatient EUS.    Objective:  Vital signs in last 24 hours: Temp:  [98.3 F (36.8 C)-98.6 F (37 C)] 98.3 F (36.8 C) (02/06 0500) Pulse Rate:  [78-88] 80 (02/06 0904) Resp:  [18-20] 20 (02/06 0500) BP: (160-174)/(90-103) 160/90 mmHg (02/06 0904) SpO2:  [98 %-100 %] 99 % (02/06 0500) Weight:  [142 lb 13.7 oz (64.8 kg)] 142 lb 13.7 oz (64.8 kg) (02/06 0500) Last BM Date: 04/16/15 General:  Alert, Well-developed, in NAD Heart:  Regular rate and rhythm; no murmurs Pulm:  CTAB.  No W/R/R. Abdomen:  Soft, non-distended.  BS present.  Mild epigastric TTP. Extremities:  Without edema. Neurologic:  Alert and oriented x 4; grossly normal neurologically. Psych:  Alert and cooperative. Normal mood and affect.  Intake/Output from previous day: 02/05 0701 - 02/06 0700 In: 1385.4 [I.V.:1385.4] Out: 2125 [Urine:2125]  Lab Results:  Recent Labs  04/16/15 0705 04/16/15 1023 04/18/15 0547  WBC 8.0 7.8 9.0  HGB 16.2 15.6 15.1  HCT 47.3 46.2 44.4  PLT 319 294 232   BMET  Recent Labs  04/16/15 0705 04/16/15 1023 04/18/15 0547  NA 142  --  139  K 4.1  --  3.8  CL 107  --  105  CO2 24  --  25  GLUCOSE 107*  --  84  BUN 7  --  <5*  CREATININE 1.02 0.96 0.83  CALCIUM 8.6*  --  8.2*   LFT  Recent Labs  04/18/15 0547  PROT 5.2*  ALBUMIN 3.0*  AST 26  ALT 18  ALKPHOS 91  BILITOT 0.9  BILIDIR <0.1*  IBILI NOT CALCULATED   Assessment / Plan: 1) Abdominal pain with 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 EUS is already scheduled for March 2 and will just proceed at that time.  Advance diet as tolerated. 2)  History of coronary artery disease/unstable angina/non-STEMI- November 2015 when a drug eluting RCA stent was placed by Dr. Virl Axe on [place].  *Patient was admitted for chest pain, elevated BP, and tachycardia, not abdominal pain/pancreatitis.  His imagining shows improvement in pancreatic findings.  He is already scheduled for EUS on 3/2 and we will just proceed at that time.  He is now on Brilinta (was supposed to be on this previously but did not take it bc he could not afford it), so this will need to be held prior to EUS.  We have made him a follow-up appt in our office to get approval to hold that before his procedure (appt is in D/C instructions).   LOS: 1 day   ZEHR, JESSICA D.  04/18/2015, 2:28 PM  Pager number [phone]   Attending physician's note   I have taken an interval history, reviewed the chart and examined the patient. I agree with the Advanced Practitioner's note, impression and recommendations.  Outpatient EUS for evaluation of ?pancreatic mass vs pancreatitis. Will need to hold Brilinta prior to the procedure Discussed alcohol cessation in detail with patient Advance diet as tolerated We will sign off, please call with any questions   K Scherry Ran, [place] [phone] Mon-Fri 8a-5p [phone] after 5p, weekends, holidays

## 2015-04-18 NOTE — Telephone Encounter (Signed)
Line busy unable to reach patient. 

## 2015-04-18 NOTE — Telephone Encounter (Signed)
Patient notified to have his nurse page Doug Sou, [place] for orders.

## 2015-04-18 NOTE — Progress Notes (Signed)
PROGRESS NOTE  Douglas [name] ZOX:[id number] DOB: 1966-02-09 DOA: 04/16/2015 PCP: No PCP Per Patient Brief History 50 year old male with a history of alcohol and tobacco abuse, coronary artery disease with a history of STEMI s/p RCA stent 01/2014, recent alcoholic pancreatitis presented with chest pain. The patient was recently discharged from the hospital with Percocet of which she finished within 24 hours. The patient has had 2 recent hospital admissions for alcoholic pancreatitis. Most recently, the patient was discharged from the hospital on 04/10/2015 with follow-up with Dr. Christella Hartigan.  He represented with one-day history of chest pain radiating to the left arm. The patient also complained of nausea and vomiting with associated abdominal pain. He claims that he only had 2 beers since discharge from the hospital 04/10/2015. MRCP was performed on from before 2017 which revealed improving pancreatic edema with concerns of distal common bile duct stricture and possible round and pancreatic mass inferior to the ampulla of Vater. Gastroenterology was consulted. Lipase was 75 on the day of presentation. Assessment/Plan: Chest pain in the setting of underlying coronary artery disease -01/18/2014 cardiac catheterization high-grade mid to distal RCA stenosis--> DES stent placed -Patient has not followed up with cardiology -Troponins negative 3 -EKG with nonspecific T-wave change -Patient only took 2 months of Brillinta before stopping due to cost -consult cardiology due to concerns of RCA DES and noncompliance with Brillinta  Alcoholic pancreatitis/Pancreatic Divisum/Pancreatic Mass -04/16/2015 MRCP findings as discussed above--with improving pancreatic edema, and concern for pancreatic mass -Appreciate GI followup -plans noted for EUS -originally planned EUS 04/21/15 with Dr. Christella Hartigan prior to admission -Continue npo for now Opioid seeking behavior -of note, he received 40 Vicodin on 1/16 from  GI -Finished 10 Percocet within one day from his last discharge Tobacco abuse -Greater than 30 pack years -NicoDerm patch Alcohol abuse -Alcohol withdrawal protocol     Family Communication:   Pt at beside Disposition Plan:   Home when medically stable likely 2-3 days       Procedures/Studies: Dg Chest 2 View  04/16/2015  CLINICAL DATA:  Acute left-sided chest pain. EXAM: CHEST  2 VIEW COMPARISON:  April 06, 2015. FINDINGS: The heart size and mediastinal contours are within normal limits. No pneumothorax or pleural effusion is noted. Stable calcified granuloma is noted in right upper lobe. No acute pulmonary disease noted. The visualized skeletal structures are unremarkable. IMPRESSION: No active cardiopulmonary disease. Electronically Signed   By: Lupita Raider, M.D.   On: 04/16/2015 07:58   Dg Chest 2 View  03/22/2015  CLINICAL DATA:  Left side chest pain for few days with worsening EXAM: CHEST  2 VIEW COMPARISON:  01/18/2014 FINDINGS: Cardiomediastinal silhouette is stable. No acute infiltrate or pleural effusion. No pulmonary edema. Bony thorax is unremarkable. IMPRESSION: No active cardiopulmonary disease. Electronically Signed   By: Natasha Mead M.D.   On: 03/22/2015 10:49   Dg Ribs Unilateral W/chest Left  03/24/2015  CLINICAL DATA:  50 year old male with fall and left rib pain. EXAM: LEFT RIBS AND CHEST - 3+ VIEW COMPARISON:  Chest radiograph dated 03/22/2015 FINDINGS: There is nondisplaced fracture of the lateral left 8th rib. No pneumothorax. The lungs are clear. A stable 4 mm right upper lobe calcific nodule, likely a granuloma. The soft tissues appear unremarkable. IMPRESSION: Nondisplaced fracture of the lateral aspect of the left eighth rib. No pneumothorax. Electronically Signed   By: Elgie Collard M.D.   On: 03/24/2015 23:46   US Abdomen  Complete  04/16/2015  CLINICAL DATA:  Patient with abdominal pain and history pancreatitis. EXAM: ABDOMEN ULTRASOUND COMPLETE  COMPARISON:  MRI abdomen 03/07/2015 FINDINGS: Gallbladder: No gallstones or wall thickening visualized. No sonographic Murphy sign noted by sonographer. Common bile duct: Diameter: 6 mm Liver: No focal lesion identified. Within normal limits in parenchymal echogenicity. IVC: No abnormality visualized. Pancreas: Visualized portion unremarkable. Spleen: Size and appearance within normal limits. Right Kidney: Length: 11.3 cm. Echogenicity within normal limits. No mass or hydronephrosis visualized. Left Kidney: Length: 11.1 cm. Echogenicity within normal limits. No mass or hydronephrosis visualized. Abdominal aorta: No aneurysm visualized. Other findings: None. IMPRESSION: Common bile duct upper limits of normal. Recommend correlation with LFTs. No cholelithiasis or sonographic evidence for acute cholecystitis. No hydronephrosis. Electronically Signed   By: Annia Belt M.D.   On: 04/16/2015 14:09   Mr Abdomen Mrcp Wo Cm  04/16/2015  CLINICAL DATA:  Acute pancreatitis. Follow-up pancreatitis. Persistent pain. EXAM: MRI ABDOMEN WITHOUT CONTRAST  (INCLUDING MRCP) TECHNIQUE: Multiplanar multisequence MR imaging of the abdomen was performed. Heavily T2-weighted images of the biliary and pancreatic ducts were obtained, and three-dimensional MRCP images were rendered by post processing. COMPARISON:  MRI 03/07/2015, CT 03/05/2015 FINDINGS: Lower chest:  Lung bases are clear. Hepatobiliary: No intrahepatic biliary duct dilatation. No focal hepatic lesion. No hepatic steatosis. There is mild extrahepatic duct dilatation. No intrahepatic duct dilatation. The extrahepatic duct dilatation is decreased when compared to prior. The common hepatic duct measures 7 mm compared to 8 mm. The common bile duct measures 7 mm compared to 10 mm (image 21-17 of series 5). There is relief of the distal stricturing of the common bile duct leading up to the ampulla. No filling defect within the common bile duct. The gallbladder is normal. Again  demonstrated variant pancreatic ductal anatomy with Pancreas: The main pancreatic duct drains into the accessory duct superior to the ampulla (ductus divisum) .no ductal dilatation. There is mild inflammation with uncinate process of the pancreas with high signal intensity on T2 weighted imaging (image 32, series 6). On the T1 weighted imaging there is a low-attenuation rounded region within the head of the pancreas inferior to the ampulla measuring 2.7 by 2.5 cm (image 84, series 17). This is more subtly seen on series 18, series 71. No IV contrast was administered. Spleen: Normal spleen. Adrenals/urinary tract: Adrenal glands and kidneys are normal. Stomach/Bowel: Stomach and limited of the small bowel is unremarkable Vascular/Lymphatic: Abdominal aortic normal caliber. No retroperitoneal periportal lymphadenopathy. Musculoskeletal: No aggressive osseous lesion IMPRESSION: 1. Improvement in pancreatic edema and reduction and distal stricturing of the common bile duct consists with improved pancreatitis. 2. There is however, persistent rounded signal abnormality inferior to the ampulla within the pancreatic head. Cannot exclude PANCREATIC ADENOCARCINOMA. Recommend endoscopic ultrasound further evaluation. Alternative follow-up MRI with IV contrast in 3 months could be considered. 3. Pancreatic ductal ductus divisum anatomy. 4. Interval decrease in mild dilatation of the common bile duct seen on comparison exam. Electronically Signed   By: Genevive Bi M.D.   On: 04/16/2015 15:04   Mr 3d Recon At Scanner  04/16/2015  CLINICAL DATA:  Acute pancreatitis. Follow-up pancreatitis. Persistent pain. EXAM: MRI ABDOMEN WITHOUT CONTRAST  (INCLUDING MRCP) TECHNIQUE: Multiplanar multisequence MR imaging of the abdomen was performed. Heavily T2-weighted images of the biliary and pancreatic ducts were obtained, and three-dimensional MRCP images were rendered by post processing. COMPARISON:  MRI 03/07/2015, CT 03/05/2015  FINDINGS: Lower chest:  Lung bases are clear. Hepatobiliary: No intrahepatic biliary  duct dilatation. No focal hepatic lesion. No hepatic steatosis. There is mild extrahepatic duct dilatation. No intrahepatic duct dilatation. The extrahepatic duct dilatation is decreased when compared to prior. The common hepatic duct measures 7 mm compared to 8 mm. The common bile duct measures 7 mm compared to 10 mm (image 21-17 of series 5). There is relief of the distal stricturing of the common bile duct leading up to the ampulla. No filling defect within the common bile duct. The gallbladder is normal. Again demonstrated variant pancreatic ductal anatomy with Pancreas: The main pancreatic duct drains into the accessory duct superior to the ampulla (ductus divisum) .no ductal dilatation. There is mild inflammation with uncinate process of the pancreas with high signal intensity on T2 weighted imaging (image 32, series 6). On the T1 weighted imaging there is a low-attenuation rounded region within the head of the pancreas inferior to the ampulla measuring 2.7 by 2.5 cm (image 84, series 17). This is more subtly seen on series 18, series 71. No IV contrast was administered. Spleen: Normal spleen. Adrenals/urinary tract: Adrenal glands and kidneys are normal. Stomach/Bowel: Stomach and limited of the small bowel is unremarkable Vascular/Lymphatic: Abdominal aortic normal caliber. No retroperitoneal periportal lymphadenopathy. Musculoskeletal: No aggressive osseous lesion IMPRESSION: 1. Improvement in pancreatic edema and reduction and distal stricturing of the common bile duct consists with improved pancreatitis. 2. There is however, persistent rounded signal abnormality inferior to the ampulla within the pancreatic head. Cannot exclude PANCREATIC ADENOCARCINOMA. Recommend endoscopic ultrasound further evaluation. Alternative follow-up MRI with IV contrast in 3 months could be considered. 3. Pancreatic ductal ductus divisum  anatomy. 4. Interval decrease in mild dilatation of the common bile duct seen on comparison exam. Electronically Signed   By: Genevive Bi M.D.   On: 04/16/2015 15:04   Dg Abd Acute W/chest  04/06/2015  CLINICAL DATA:  Rib pain, twisted run at work, generalized abdominal pain radiating around to back, constipation, history coronary artery disease, smoking, GERD, fatty liver EXAM: DG ABDOMEN ACUTE W/ 1V CHEST COMPARISON:  Chest radiograph 03/24/2015 FINDINGS: Normal heart size, mediastinal contours, and pulmonary vascularity. Emphysematous and bronchitic changes question COPD. Calcified granuloma RIGHT upper lobe. No definite pulmonary infiltrate, pleural effusion, or pneumothorax. Bowel gas pattern normal. No bowel dilatation, bowel wall thickening or free intraperitoneal air. Osseous structures normal. No urinary tract calcification. IMPRESSION: COPD changes without acute infiltrate. Electronically Signed   By: Ulyses Southward M.D.   On: 04/06/2015 09:52         Subjective: Patient is feeling better overall. No further nausea and vomiting. Abdominal pain is improving. He is no longer having any chest pain but having some shortness of breath which she feels is related to his anxiety. Denies any fevers, chills, headache, neck pain, dysuria, hematuria.  Objective: Filed Vitals:   04/17/15 2123 04/17/15 2126 04/17/15 2330 04/18/15 0500  BP: 174/103  167/99 165/94  Pulse: 87 86 78 88  Temp:   98.6 F (37 C) 98.3 F (36.8 C)  TempSrc:   Oral Oral  Resp: 18  18 20   Height:      Weight:    64.8 kg (142 lb 13.7 oz)  SpO2: 99%  100% 99%    Intake/Output Summary (Last 24 hours) at 04/18/15 0727 Last data filed at 04/18/15 0200  Gross per 24 hour  Intake 1385.41 ml  Output   2125 ml  Net -739.59 ml   Weight change: -0.7 kg (-1 lb 8.7 oz) Exam:   General:  Pt  is alert, follows commands appropriately, not in acute distress  HEENT: No icterus, No thrush, No neck mass,  [place]/AT  Cardiovascular: RRR, S1/S2, no rubs, no gallops  Respiratory: Diminished breath sounds but clear to auscultation.  Abdomen: Soft/+BS, non tender, non distended, no guarding; no hepatosplenomegaly  Extremities: No edema, No lymphangitis, No petechiae, No rashes, no synovitis; no clubbing or cyanosis  Data Reviewed: Basic Metabolic Panel:  Recent Labs Lab 04/16/15 0705 04/16/15 1023  NA 142  --   K 4.1  --   CL 107  --   CO2 24  --   GLUCOSE 107*  --   BUN 7  --   CREATININE 1.02 0.96  CALCIUM 8.6*  --    Liver Function Tests:  Recent Labs Lab 04/16/15 0705  AST 29  ALT 22  ALKPHOS 98  BILITOT 0.5  PROT 5.7*  ALBUMIN 3.1*    Recent Labs Lab 04/16/15 0705  LIPASE 59*   No results for input(s): AMMONIA in the last 168 hours. CBC:  Recent Labs Lab 04/16/15 0705 04/16/15 1023 04/18/15 0547  WBC 8.0 7.8 9.0  HGB 16.2 15.6 15.1  HCT 47.3 46.2 44.4  MCV 98.3 98.5 98.2  PLT 319 294 232   Cardiac Enzymes:  Recent Labs Lab 04/16/15 1023 04/16/15 1540 04/16/15 2247  TROPONINI <0.03 <0.03 <0.03   BNP: Invalid input(s): POCBNP CBG: No results for input(s): GLUCAP in the last 168 hours.  No results found for this or any previous visit (from the past 240 hour(s)).   Scheduled Meds: . aspirin EC  81 mg Oral Daily  . enoxaparin (LOVENOX) injection  40 mg Subcutaneous Q24H  . folic acid  1 mg Oral Daily  . metoprolol tartrate  12.5 mg Oral BID  . multivitamin with minerals  1 tablet Oral Daily  . nicotine  14 mg Transdermal Daily  . pantoprazole  40 mg Oral Daily  . thiamine  100 mg Oral Daily   Or  . thiamine  100 mg Intravenous Daily  . ticagrelor  90 mg Oral BID   Continuous Infusions: . sodium chloride 125 mL/hr at 04/17/15 2000     Krzysztof Reichelt, DO  [facility] Pager [phone]  If 7PM-7AM, please contact night-coverage [email or web address] Password [email or web address] 04/18/2015, 7:27 AM   LOS: 1 day

## 2015-04-19 DIAGNOSIS — F1010 Alcohol abuse, uncomplicated: Secondary | ICD-10-CM

## 2015-04-19 DIAGNOSIS — R0789 Other chest pain: Secondary | ICD-10-CM

## 2015-04-19 LAB — COMPREHENSIVE METABOLIC PANEL
ALT: 20 U/L (ref 17–63)
AST: 26 U/L (ref 15–41)
Albumin: 3 g/dL — ABNORMAL LOW (ref 3.5–5.0)
Alkaline Phosphatase: 97 U/L (ref 38–126)
Anion gap: 12 (ref 5–15)
BUN: <5 mg/dL — ABNORMAL LOW (ref 6–20)
CHLORIDE: 103 mmol/L (ref 101–111)
CO2: 26 mmol/L (ref 22–32)
Calcium: 8.4 mg/dL — ABNORMAL LOW (ref 8.9–10.3)
Creatinine, Ser: 0.9 mg/dL (ref 0.61–1.24)
Glucose, Bld: 111 mg/dL — ABNORMAL HIGH (ref 65–99)
POTASSIUM: 3.4 mmol/L — AB (ref 3.5–5.1)
Sodium: 141 mmol/L (ref 135–145)
Total Bilirubin: 0.6 mg/dL (ref 0.3–1.2)
Total Protein: 5.5 g/dL — ABNORMAL LOW (ref 6.5–8.1)

## 2015-04-19 LAB — CBC
HEMATOCRIT: 45.5 % (ref 39.0–52.0)
Hemoglobin: 14.9 g/dL (ref 13.0–17.0)
MCH: 32.2 pg (ref 26.0–34.0)
MCHC: 32.7 g/dL (ref 30.0–36.0)
MCV: 98.3 fL (ref 78.0–100.0)
PLATELETS: 218 10*3/uL (ref 150–400)
RBC: 4.63 MIL/uL (ref 4.22–5.81)
RDW: 12.6 % (ref 11.5–15.5)
WBC: 7.5 10*3/uL (ref 4.0–10.5)

## 2015-04-19 LAB — CANCER ANTIGEN 19-9: CA 19 9: 15 U/mL (ref 0–35)

## 2015-04-19 MED ORDER — CLOPIDOGREL BISULFATE 75 MG PO TABS: 75.0000 mg | ORAL_TABLET | Freq: Every day | ORAL | Status: DC

## 2015-04-19 MED ORDER — HYDROCODONE-ACETAMINOPHEN 5-325 MG PO TABS: ORAL_TABLET | ORAL | Status: DC

## 2015-04-19 MED ORDER — METOPROLOL TARTRATE 12.5 MG HALF TABLET: 12.5000 mg | ORAL_TABLET | Freq: Once | ORAL | Status: DC

## 2015-04-19 MED ORDER — METOPROLOL TARTRATE 25 MG PO TABS: 25.0000 mg | ORAL_TABLET | Freq: Two times a day (BID) | ORAL | Status: DC

## 2015-04-19 NOTE — Care Management (Signed)
1203 04-19-15 Tomi Bamberger, RN, BSN [phone] CM did speak with pt in regards to disposition needs and medications. Pt was originally taking Brilinta and has been noncompliant with medications. MD Tat did speak with MD Donnie Aho plan to change pt to Plavix. Pt is aware. No further needs from CM at this time.

## 2015-04-19 NOTE — Discharge Summary (Signed)
Physician Discharge Summary  Shawnta Zimbelman ZOX:[id number] DOB: [birth year] DOA: 04/16/2015  PCP: No PCP Per Patient  Admit date: 04/16/2015 Discharge date: 04/19/2015  Recommendations for Outpatient Follow-up:  1. Pt will need to follow up with PCP in 2 weeks post discharge 2. Please obtain BMP and CBC in one week Discharge Diagnoses:  Chest pain in the setting of underlying coronary artery disease -01/18/2014 cardiac catheterization high-grade mid to distal RCA stenosis--> DES stent placed -resolved after < 24 hours without recurrence -Patient has not followed up with cardiology -Troponins negative 3 -EKG with nonspecific T-wave change -Patient only took 2 months of Brillinta before stopping due to cost -consult cardiology due to concerns of RCA DES and noncompliance with Brillinta -case discussed with Dr. Sheran Luz can be d/c with ASA 81mg  and Plavix 75 mg daily rather than Brillinta; follow up in office in 1 month Alcoholic pancreatitis/Pancreatic Divisum/Pancreatic Mass -04/16/2015 MRCP findings as discussed above--with improving pancreatic edema, and concern for pancreatic mass -CA 19-9-- 15 -Appreciate GI followup--no urgency to perform EUS-->follow up in office 2/14 -plans noted for EUS -originally planned EUS 04/21/15 with Dr. Christella Hartigan prior to admission -Continue npo for now -Rx Norco 5/325 #8, one po q 6 hours prn pain Opioid seeking behavior -of note, he received 40 Vicodin on 1/16 from GI -Finished 10 Percocet within one day from his last discharge Tobacco abuse -Greater than 30 pack years -NicoDerm patch Alcohol abuse -Alcohol withdrawal protocol   Discharge Condition: stable  Disposition: home Follow-up Information    Follow up with Hvozdovic, Tollie Pizza, PA-C On 04/26/2015.   Specialty:  Gastroenterology   Why:  8:15 am   Contact information:   [address] [place] [address] [phone]       Follow up with Georga Hacking, MD In 1 month.   Specialty:  Cardiology   Contact information:   [address] [place] [address] [place] [address] [phone]       Diet:cardiac Wt Readings from Last 3 Encounters:  04/19/15 66.225 kg (146 lb)  04/06/15 65.2 kg (143 lb 11.8 oz)  03/28/15 67.699 kg (149 lb 4 oz)    History of present illness:  50 year old male with a history of alcohol and tobacco abuse, coronary artery disease with a history of STEMI s/p RCA stent 01/2014, recent alcoholic pancreatitis presented with chest pain. The patient was recently discharged from the hospital with Percocet of which she finished within 24 hours. The patient has had 2 recent hospital admissions for alcoholic pancreatitis. Most recently, the patient was discharged from the hospital on 04/10/2015 with follow-up with Dr. Christella Hartigan. He represented with one-day history of chest pain radiating to the left arm. The patient also complained of nausea and vomiting with associated abdominal pain. He claims that he only had 2 beers since discharge from the hospital 04/10/2015. MRCP was performed on from before 2017 which revealed improving pancreatic edema with concerns of distal common bile duct stricture and possible round and pancreatic mass inferior to the ampulla of Vater. Gastroenterology was consulted.  Theye did not feel there was any acute need to perform EUS presently as pt was on Brillinta in hospital.   Lipase was 75 on the day of presentation.  Pt's diet was advanced and he tolerated it.  Consultants: [facility]  Discharge Exam: Filed Vitals:   04/19/15 0116 04/19/15 0553  BP: 157/101 181/93  Pulse: 103 82  Temp:  97.7 F (36.5 C)  Resp:  19   Filed Vitals:  04/18/15 2140 04/18/15 2207 04/19/15 0116 04/19/15 0553  BP: 152/92 150/93 157/101 181/93  Pulse: 108  103 82  Temp: 97.9 F (36.6 C)   97.7 F (36.5 C)  TempSrc: Oral   Oral  Resp: 20   19  Height:      Weight:    66.225 kg (146 lb)  SpO2: 98%   100%   General: A&O x 3, NAD,  pleasant, cooperative Cardiovascular: RRR, no rub, no gallop, no S3 Respiratory: CTAB, no wheeze, no rhonchi Abdomen:soft, nontender, nondistended, positive bowel sounds Extremities: No edema, No lymphangitis, no petechiae  Discharge Instructions      Discharge Instructions    Diet - low sodium heart healthy    Complete by:  As directed      Increase activity slowly    Complete by:  As directed             Medication List    STOP taking these medications        BC HEADACHE POWDER PO      TAKE these medications        aspirin EC 81 MG tablet  Take 1 tablet (81 mg total) by mouth daily.     clopidogrel 75 MG tablet  Commonly known as:  PLAVIX  Take 1 tablet (75 mg total) by mouth daily.     HYDROcodone-acetaminophen 5-325 MG tablet  Commonly known as:  NORCO/VICODIN  Take 1 tab every 6-8 hours as needed for pain     metoprolol tartrate 25 MG tablet  Commonly known as:  LOPRESSOR  Take 1 tablet (25 mg total) by mouth 2 (two) times daily.     pantoprazole 40 MG tablet  Commonly known as:  PROTONIX  Take 1 tab by mouth every morning.         The results of significant diagnostics from this hospitalization (including imaging, microbiology, ancillary and laboratory) are listed below for reference.    Significant Diagnostic Studies: Dg Chest 2 View  04/16/2015  CLINICAL DATA:  Acute left-sided chest pain. EXAM: CHEST  2 VIEW COMPARISON:  April 06, 2015. FINDINGS: The heart size and mediastinal contours are within normal limits. No pneumothorax or pleural effusion is noted. Stable calcified granuloma is noted in right upper lobe. No acute pulmonary disease noted. The visualized skeletal structures are unremarkable. IMPRESSION: No active cardiopulmonary disease. Electronically Signed   By: Lupita Raider, M.D.   On: 04/16/2015 07:58   Dg Chest 2 View  03/22/2015  CLINICAL DATA:  Left side chest pain for few days with worsening EXAM: CHEST  2 VIEW COMPARISON:   01/18/2014 FINDINGS: Cardiomediastinal silhouette is stable. No acute infiltrate or pleural effusion. No pulmonary edema. Bony thorax is unremarkable. IMPRESSION: No active cardiopulmonary disease. Electronically Signed   By: Natasha Mead M.D.   On: 03/22/2015 10:49   Dg Ribs Unilateral W/chest Left  03/24/2015  CLINICAL DATA:  50 year old male with fall and left rib pain. EXAM: LEFT RIBS AND CHEST - 3+ VIEW COMPARISON:  Chest radiograph dated 03/22/2015 FINDINGS: There is nondisplaced fracture of the lateral left 8th rib. No pneumothorax. The lungs are clear. A stable 4 mm right upper lobe calcific nodule, likely a granuloma. The soft tissues appear unremarkable. IMPRESSION: Nondisplaced fracture of the lateral aspect of the left eighth rib. No pneumothorax. Electronically Signed   By: Elgie Collard M.D.   On: 03/24/2015 23:46   US Abdomen Complete  04/16/2015  CLINICAL DATA:  Patient with abdominal pain  and history pancreatitis. EXAM: ABDOMEN ULTRASOUND COMPLETE COMPARISON:  MRI abdomen 03/07/2015 FINDINGS: Gallbladder: No gallstones or wall thickening visualized. No sonographic Murphy sign noted by sonographer. Common bile duct: Diameter: 6 mm Liver: No focal lesion identified. Within normal limits in parenchymal echogenicity. IVC: No abnormality visualized. Pancreas: Visualized portion unremarkable. Spleen: Size and appearance within normal limits. Right Kidney: Length: 11.3 cm. Echogenicity within normal limits. No mass or hydronephrosis visualized. Left Kidney: Length: 11.1 cm. Echogenicity within normal limits. No mass or hydronephrosis visualized. Abdominal aorta: No aneurysm visualized. Other findings: None. IMPRESSION: Common bile duct upper limits of normal. Recommend correlation with LFTs. No cholelithiasis or sonographic evidence for acute cholecystitis. No hydronephrosis. Electronically Signed   By: Annia Belt M.D.   On: 04/16/2015 14:09   Mr Abdomen Mrcp Wo Cm  04/16/2015  CLINICAL DATA:   Acute pancreatitis. Follow-up pancreatitis. Persistent pain. EXAM: MRI ABDOMEN WITHOUT CONTRAST  (INCLUDING MRCP) TECHNIQUE: Multiplanar multisequence MR imaging of the abdomen was performed. Heavily T2-weighted images of the biliary and pancreatic ducts were obtained, and three-dimensional MRCP images were rendered by post processing. COMPARISON:  MRI 03/07/2015, CT 03/05/2015 FINDINGS: Lower chest:  Lung bases are clear. Hepatobiliary: No intrahepatic biliary duct dilatation. No focal hepatic lesion. No hepatic steatosis. There is mild extrahepatic duct dilatation. No intrahepatic duct dilatation. The extrahepatic duct dilatation is decreased when compared to prior. The common hepatic duct measures 7 mm compared to 8 mm. The common bile duct measures 7 mm compared to 10 mm (image 21-17 of series 5). There is relief of the distal stricturing of the common bile duct leading up to the ampulla. No filling defect within the common bile duct. The gallbladder is normal. Again demonstrated variant pancreatic ductal anatomy with Pancreas: The main pancreatic duct drains into the accessory duct superior to the ampulla (ductus divisum) .no ductal dilatation. There is mild inflammation with uncinate process of the pancreas with high signal intensity on T2 weighted imaging (image 32, series 6). On the T1 weighted imaging there is a low-attenuation rounded region within the head of the pancreas inferior to the ampulla measuring 2.7 by 2.5 cm (image 84, series 17). This is more subtly seen on series 18, series 71. No IV contrast was administered. Spleen: Normal spleen. Adrenals/urinary tract: Adrenal glands and kidneys are normal. Stomach/Bowel: Stomach and limited of the small bowel is unremarkable Vascular/Lymphatic: Abdominal aortic normal caliber. No retroperitoneal periportal lymphadenopathy. Musculoskeletal: No aggressive osseous lesion IMPRESSION: 1. Improvement in pancreatic edema and reduction and distal stricturing of  the common bile duct consists with improved pancreatitis. 2. There is however, persistent rounded signal abnormality inferior to the ampulla within the pancreatic head. Cannot exclude PANCREATIC ADENOCARCINOMA. Recommend endoscopic ultrasound further evaluation. Alternative follow-up MRI with IV contrast in 3 months could be considered. 3. Pancreatic ductal ductus divisum anatomy. 4. Interval decrease in mild dilatation of the common bile duct seen on comparison exam. Electronically Signed   By: Genevive Bi M.D.   On: 04/16/2015 15:04   Mr 3d Recon At Scanner  04/16/2015  CLINICAL DATA:  Acute pancreatitis. Follow-up pancreatitis. Persistent pain. EXAM: MRI ABDOMEN WITHOUT CONTRAST  (INCLUDING MRCP) TECHNIQUE: Multiplanar multisequence MR imaging of the abdomen was performed. Heavily T2-weighted images of the biliary and pancreatic ducts were obtained, and three-dimensional MRCP images were rendered by post processing. COMPARISON:  MRI 03/07/2015, CT 03/05/2015 FINDINGS: Lower chest:  Lung bases are clear. Hepatobiliary: No intrahepatic biliary duct dilatation. No focal hepatic lesion. No hepatic steatosis. There is mild  extrahepatic duct dilatation. No intrahepatic duct dilatation. The extrahepatic duct dilatation is decreased when compared to prior. The common hepatic duct measures 7 mm compared to 8 mm. The common bile duct measures 7 mm compared to 10 mm (image 21-17 of series 5). There is relief of the distal stricturing of the common bile duct leading up to the ampulla. No filling defect within the common bile duct. The gallbladder is normal. Again demonstrated variant pancreatic ductal anatomy with Pancreas: The main pancreatic duct drains into the accessory duct superior to the ampulla (ductus divisum) .no ductal dilatation. There is mild inflammation with uncinate process of the pancreas with high signal intensity on T2 weighted imaging (image 32, series 6). On the T1 weighted imaging there is a  low-attenuation rounded region within the head of the pancreas inferior to the ampulla measuring 2.7 by 2.5 cm (image 84, series 17). This is more subtly seen on series 18, series 71. No IV contrast was administered. Spleen: Normal spleen. Adrenals/urinary tract: Adrenal glands and kidneys are normal. Stomach/Bowel: Stomach and limited of the small bowel is unremarkable Vascular/Lymphatic: Abdominal aortic normal caliber. No retroperitoneal periportal lymphadenopathy. Musculoskeletal: No aggressive osseous lesion IMPRESSION: 1. Improvement in pancreatic edema and reduction and distal stricturing of the common bile duct consists with improved pancreatitis. 2. There is however, persistent rounded signal abnormality inferior to the ampulla within the pancreatic head. Cannot exclude PANCREATIC ADENOCARCINOMA. Recommend endoscopic ultrasound further evaluation. Alternative follow-up MRI with IV contrast in 3 months could be considered. 3. Pancreatic ductal ductus divisum anatomy. 4. Interval decrease in mild dilatation of the common bile duct seen on comparison exam. Electronically Signed   By: Genevive Bi M.D.   On: 04/16/2015 15:04   Dg Abd Acute W/chest  04/06/2015  CLINICAL DATA:  Rib pain, twisted run at work, generalized abdominal pain radiating around to back, constipation, history coronary artery disease, smoking, GERD, fatty liver EXAM: DG ABDOMEN ACUTE W/ 1V CHEST COMPARISON:  Chest radiograph 03/24/2015 FINDINGS: Normal heart size, mediastinal contours, and pulmonary vascularity. Emphysematous and bronchitic changes question COPD. Calcified granuloma RIGHT upper lobe. No definite pulmonary infiltrate, pleural effusion, or pneumothorax. Bowel gas pattern normal. No bowel dilatation, bowel wall thickening or free intraperitoneal air. Osseous structures normal. No urinary tract calcification. IMPRESSION: COPD changes without acute infiltrate. Electronically Signed   By: Ulyses Southward M.D.   On: 04/06/2015  09:52     Microbiology: No results found for this or any previous visit (from the past 240 hour(s)).   Labs: Basic Metabolic Panel:  Recent Labs Lab 04/16/15 0705 04/16/15 1023 04/18/15 0547 04/19/15 0817  NA 142  --  139 141  K 4.1  --  3.8 3.4*  CL 107  --  105 103  CO2 24  --  25 26  GLUCOSE 107*  --  84 111*  BUN 7  --  <5* <5*  CREATININE 1.02 0.96 0.83 0.90  CALCIUM 8.6*  --  8.2* 8.4*   Liver Function Tests:  Recent Labs Lab 04/16/15 0705 04/18/15 0547 04/19/15 0817  AST 29 26 26   ALT 22 18 20   ALKPHOS 98 91 97  BILITOT 0.5 0.9 0.6  PROT 5.7* 5.2* 5.5*  ALBUMIN 3.1* 3.0* 3.0*    Recent Labs Lab 04/16/15 0705 04/18/15 0547  LIPASE 59* 32   No results for input(s): AMMONIA in the last 168 hours. CBC:  Recent Labs Lab 04/16/15 0705 04/16/15 1023 04/18/15 0547 04/19/15 0817  WBC 8.0 7.8 9.0 7.5  HGB 16.2 15.6 15.1 14.9  HCT 47.3 46.2 44.4 45.5  MCV 98.3 98.5 98.2 98.3  PLT 319 294 232 218   Cardiac Enzymes:  Recent Labs Lab 04/16/15 1023 04/16/15 1540 04/16/15 2247  TROPONINI <0.03 <0.03 <0.03   BNP: Invalid input(s): POCBNP CBG: No results for input(s): GLUCAP in the last 168 hours.  Time coordinating discharge:  Greater than 30 minutes  Signed:  Bobie Kistler, DO [facility] Pager: [phone] 04/19/2015, 12:20 PM

## 2015-04-23 ENCOUNTER — Emergency Department (HOSPITAL_COMMUNITY)
Admission: EM | Admit: 2015-04-23 | Discharge: 2015-04-23 | Disposition: A | Discharge status: Home / Self Care | Payer: BLUE CROSS/BLUE SHIELD | Attending: Emergency Medicine | Admitting: Emergency Medicine

## 2015-04-23 ENCOUNTER — Encounter (HOSPITAL_COMMUNITY): Payer: Self-pay | Admitting: Emergency Medicine

## 2015-04-23 DIAGNOSIS — Z9889 Other specified postprocedural states: Secondary | ICD-10-CM | POA: Insufficient documentation

## 2015-04-23 DIAGNOSIS — F17210 Nicotine dependence, cigarettes, uncomplicated: Secondary | ICD-10-CM | POA: Diagnosis not present

## 2015-04-23 DIAGNOSIS — Z7902 Long term (current) use of antithrombotics/antiplatelets: Secondary | ICD-10-CM | POA: Insufficient documentation

## 2015-04-23 DIAGNOSIS — R109 Unspecified abdominal pain: Secondary | ICD-10-CM | POA: Insufficient documentation

## 2015-04-23 DIAGNOSIS — K219 Gastro-esophageal reflux disease without esophagitis: Secondary | ICD-10-CM | POA: Insufficient documentation

## 2015-04-23 DIAGNOSIS — Z8659 Personal history of other mental and behavioral disorders: Secondary | ICD-10-CM | POA: Insufficient documentation

## 2015-04-23 DIAGNOSIS — I25119 Atherosclerotic heart disease of native coronary artery with unspecified angina pectoris: Secondary | ICD-10-CM | POA: Insufficient documentation

## 2015-04-23 DIAGNOSIS — Z9861 Coronary angioplasty status: Secondary | ICD-10-CM | POA: Diagnosis not present

## 2015-04-23 DIAGNOSIS — R112 Nausea with vomiting, unspecified: Secondary | ICD-10-CM | POA: Insufficient documentation

## 2015-04-23 DIAGNOSIS — Z7982 Long term (current) use of aspirin: Secondary | ICD-10-CM | POA: Diagnosis not present

## 2015-04-23 DIAGNOSIS — I252 Old myocardial infarction: Secondary | ICD-10-CM | POA: Insufficient documentation

## 2015-04-23 DIAGNOSIS — Z79899 Other long term (current) drug therapy: Secondary | ICD-10-CM | POA: Insufficient documentation

## 2015-04-23 LAB — CBC
HEMATOCRIT: 50.6 % (ref 39.0–52.0)
Hemoglobin: 17.4 g/dL — ABNORMAL HIGH (ref 13.0–17.0)
MCH: 33.1 pg (ref 26.0–34.0)
MCHC: 34.4 g/dL (ref 30.0–36.0)
MCV: 96.2 fL (ref 78.0–100.0)
PLATELETS: 305 10*3/uL (ref 150–400)
RBC: 5.26 MIL/uL (ref 4.22–5.81)
RDW: 12.7 % (ref 11.5–15.5)
WBC: 8.3 10*3/uL (ref 4.0–10.5)

## 2015-04-23 LAB — URINALYSIS, ROUTINE W REFLEX MICROSCOPIC
BILIRUBIN URINE: NEGATIVE
GLUCOSE, UA: NEGATIVE mg/dL
Hgb urine dipstick: NEGATIVE
KETONES UR: NEGATIVE mg/dL
LEUKOCYTES UA: NEGATIVE
NITRITE: NEGATIVE
PH: 5 (ref 5.0–8.0)
PROTEIN: NEGATIVE mg/dL
Specific Gravity, Urine: 1.017 (ref 1.005–1.030)

## 2015-04-23 LAB — COMPREHENSIVE METABOLIC PANEL
ALBUMIN: 3.8 g/dL (ref 3.5–5.0)
ALK PHOS: 132 U/L — AB (ref 38–126)
ALT: 21 U/L (ref 17–63)
AST: 27 U/L (ref 15–41)
Anion gap: 9 (ref 5–15)
BILIRUBIN TOTAL: 0.4 mg/dL (ref 0.3–1.2)
BUN: 7 mg/dL (ref 6–20)
CO2: 27 mmol/L (ref 22–32)
CREATININE: 0.95 mg/dL (ref 0.61–1.24)
Calcium: 9.1 mg/dL (ref 8.9–10.3)
Chloride: 109 mmol/L (ref 101–111)
GFR calc Af Amer: >60 mL/min (ref >60)
GLUCOSE: 92 mg/dL (ref 65–99)
POTASSIUM: 4.2 mmol/L (ref 3.5–5.1)
Sodium: 145 mmol/L (ref 135–145)
TOTAL PROTEIN: 7.2 g/dL (ref 6.5–8.1)

## 2015-04-23 LAB — LIPASE, BLOOD: Lipase: 60 U/L — ABNORMAL HIGH (ref 11–51)

## 2015-04-23 MED ORDER — ONDANSETRON HCL 4 MG/2ML IJ SOLN
4.0000 mg | Freq: Once | INTRAMUSCULAR | Status: AC
  Administered 2015-04-23: 4 mg via INTRAVENOUS
  Filled 2015-04-23: qty 2

## 2015-04-23 MED ORDER — DICYCLOMINE HCL 10 MG/ML IM SOLN
20.0000 mg | Freq: Once | INTRAMUSCULAR | Status: AC
  Administered 2015-04-23: 20 mg via INTRAMUSCULAR
  Filled 2015-04-23: qty 2

## 2015-04-23 MED ORDER — PROMETHAZINE HCL 25 MG PO TABS: 25.0000 mg | ORAL_TABLET | Freq: Four times a day (QID) | ORAL | Status: DC | PRN

## 2015-04-23 MED ORDER — SODIUM CHLORIDE 0.9 % IV BOLUS (SEPSIS)
1000.0000 mL | Freq: Once | INTRAVENOUS | Status: AC
  Administered 2015-04-23: 1000 mL via INTRAVENOUS

## 2015-04-23 MED ORDER — DICYCLOMINE HCL 20 MG PO TABS: 20.0000 mg | ORAL_TABLET | Freq: Two times a day (BID) | ORAL | Status: DC

## 2015-04-23 NOTE — Discharge Instructions (Signed)
Please follow up with your GI specialist for further management of your abdominal pain.  Please avoid alcohol as it can worsen your symptoms.   Abdominal Pain, Adult Many things can cause abdominal pain. Usually, abdominal pain is not caused by a disease and will improve without treatment. It can often be observed and treated at home. Your health care provider will do a physical exam and possibly order blood tests and X-rays to help determine the seriousness of your pain. However, in many cases, more time must pass before a clear cause of the pain can be found. Before that point, your health care provider may not know if you need more testing or further treatment. HOME CARE INSTRUCTIONS Monitor your abdominal pain for any changes. The following actions may help to alleviate any discomfort you are experiencing:  Only take over-the-counter or prescription medicines as directed by your health care provider.  Do not take laxatives unless directed to do so by your health care provider.  Try a clear liquid diet (broth, tea, or water) as directed by your health care provider. Slowly move to a bland diet as tolerated. SEEK MEDICAL CARE IF:  You have unexplained abdominal pain.  You have abdominal pain associated with nausea or diarrhea.  You have pain when you urinate or have a bowel movement.  You experience abdominal pain that wakes you in the night.  You have abdominal pain that is worsened or improved by eating food.  You have abdominal pain that is worsened with eating fatty foods.  You have a fever. SEEK IMMEDIATE MEDICAL CARE IF:  Your pain does not go away within 2 hours.  You keep throwing up (vomiting).  Your pain is felt only in portions of the abdomen, such as the right side or the left lower portion of the abdomen.  You pass bloody or black tarry stools. MAKE SURE YOU:  Understand these instructions.  Will watch your condition.  Will get help right away if you are not  doing well or get worse.   This information is not intended to replace advice given to you by your health care provider. Make sure you discuss any questions you have with your health care provider.   Document Released: 12/06/2004 Document Revised: 11/17/2014 Document Reviewed: 11/05/2012 Elsevier Interactive Patient Education 2016 [employer].   Emergency Department Resource Guide 1) Find a Doctor and Pay Out of Pocket Although you won't have to find out who is covered by your insurance plan, it is a good idea to ask around and get recommendations. You will then need to call the office and see if the doctor you have chosen will accept you as a new patient and what types of options they offer for patients who are self-pay. Some doctors offer discounts or will set up payment plans for their patients who do not have insurance, but you will need to ask so you aren't surprised when you get to your appointment.  2) Contact Your Local Health Department Not all health departments have doctors that can see patients for sick visits, but many do, so it is worth a call to see if yours does. If you don't know where your local health department is, you can check in your phone book. The CDC also has a tool to help you locate your state's health department, and many state websites also have listings of all of their local health departments.  3) Find a Walk-in Clinic If your illness is not likely to be very  severe or complicated, you may want to try a walk in clinic. These are popping up all over the country in pharmacies, drugstores, and shopping centers. They're usually staffed by nurse practitioners or physician assistants that have been trained to treat common illnesses and complaints. They're usually fairly quick and inexpensive. However, if you have serious medical issues or chronic medical problems, these are probably not your best option.  No Primary Care Doctor: - Call Health Connect at  [phone] - they  can help you locate a primary care doctor that  accepts your insurance, provides certain services, etc. - Physician Referral Service- [phone]  Chronic Pain Problems: Organization         Address  Phone   Notes  [facility]  [phone] Patients need to be referred by their primary care doctor.   Medication Assistance: Organization         Address  Phone   Notes  [facility] Medication [facility] [address] [place] [place], [place] [address] [phone] --Must be a resident of [facility] -- Must have NO insurance coverage whatsoever (no Medicaid/ Medicare, etc.) -- The pt. MUST have a primary care doctor that directs their care regularly and follows them in the community   [facility]  [phone]   [employer]  [phone]    Agencies that provide inexpensive medical care: Organization         Address  Phone   Notes  [facility]  [phone]   [facility]    [phone]   [facility] [address] [place], [place] [address] [phone]   [facility] [address], [place] [phone]   Planned Parenthood    [phone]   [facility]    [phone]   [facility]  [address] Phone:  [phone], Fax:  [phone] Hours of Operation:  9 am - 6 pm, M-F.  Also accepts Medicaid/Medicare and self-pay.  [facility] for [facility]  [address], [place] Phone: [phone], Fax: [phone]. Hours of Operation:  8:30 am - 5:30 pm, M-F.  Also accepts Medicaid and self-pay.  [facility] [place] [address], [place] Phone: [phone]   [facility] [address] Natasha Bence [place], [place] [phone], Ext. [phone] Mondays & Thursdays: 7-9 AM.  First 15 patients are seen on a first come, first serve basis.    Medicaid-accepting  [facility] Providers:  Organization         Address  Phone   Notes  [facility] [address], [place] [phone] Also accepts self-pay patients.  [facility] [address] Laurell Josephs [place], [place]  [phone]   [facility] [address], [place] [phone]   [facility] [address], [place] [phone]   Renaye Rakers [address], [place]   [phone] Only accepts [place] Access [place] patients after they have their name applied to their card.   Self-Pay (no insurance) in [facility]:  Organization         Address  Phone   Notes  Sickle Cell Patients, [facility] [address] [place], [place] [phone]   [facility] [phone]  [address], [place] [phone]) [phone]   [facility] [place]  [address], [place] [phone]   [facility]/Dr. Osei-Bonsu  [address] [place] [address], [place] or [phone], [place] [phone]) [phone] Phone number for both [place] and [place] locations is the same.  [facility] [address], [place] [phone]) [phone]   [facility] [place] [address] [place] [address], [place] or [address] [phone]) [phone]   [facility] [address], [place] [phone]) [phone], phone; [phone]) [phone], fax Sees patients 1st and 3rd Saturday of every month.  Must not qualify for public or private insurance (i.e. Medicaid, Medicare, [place] Health Choice, Veterans' Benefits)  Household income should be no more than 200% of the poverty level The clinic cannot treat you if you are pregnant or think you are pregnant  Sexually transmitted diseases are not treated at the clinic.    Dental Care: Organization         Address  Phone  Notes  [facility] [address], [place] [phone]) [phone] Accepts children up to age 49 who are enrolled in [place] or [place] Health Choice; pregnant women with a Medicaid card; and children who have applied for Medicaid or [place] Health Choice, but were declined, whose parents can pay a reduced fee at time of service.  [facility] [place]  [address], [place] [phone]) [phone] Accepts children up to age 49 who are enrolled in [place] or [place] Health Choice; pregnant women with a Medicaid card; and children who have applied for Medicaid or [place] Health Choice, but were declined, whose parents can pay a reduced fee at time of service.  [facility]  [address], [place] [phone]) [phone] Patients are seen by appointment only. Walk-ins are not accepted. [facility] will see patients 49 years of age and older. Monday - Tuesday (8am-5pm) Most Wednesdays (8:30-5pm) $30 per visit, cash only  [facility]  [address], [facility] [place] [phone]) [phone] Patients are seen by appointment only. Walk-ins are not accepted. [facility] will see patients 49 years of age and older. One Wednesday Evening (Monthly: Volunteer Based).  $30 per visit, cash only  [facility]  [phone] for adults; Children under age 474, call Graduate Pediatric Dentistry at [phone]) [phone]. Children aged 654-14, please call [phone] to request a pediatric application.  Dental services are provided in all areas of dental care including fillings, crowns and bridges, complete and partial dentures, implants, gum treatment, root canals, and extractions. Preventive care is also provided. Treatment is provided to both adults and children. Patients are selected via a lottery and there is often a waiting list.   [facility] [address], [place]  [phone]) [phone] [email or web address]   [facility] [address], [place], [place] [phone], Ext. [phone] Second and Fourth Thursday of each month, opens at 6:30 AM; Clinic ends at 9 AM.  Patients are seen on a first-come first-served basis, and a limited number are seen during each clinic.   [facility]  [address], [place], [place] [phone]) [phone]   Eligibility Requirements You must have lived in [place], [place], or [place] counties for at least the last three months.   You  cannot be eligible for state or federal sponsored [place], including [employer], [place], or [employer].   You generally cannot be eligible for healthcare insurance through your employer.    How to apply: Eligibility screenings are held every Tuesday and Wednesday afternoon from 1:00 pm until 4:00 pm. You do not need an appointment for the interview!  [facility] [address], [place], [place] [phone]   [facility] Health Department  [phone]   [facility] Health Department  [phone]   [facility] Health Department  [phone]    Behavioral Health Resources in the Community: Intensive Outpatient Programs Organization         Address  Phone  Notes  [facility] [address], [place], [place] [phone]   [facility] [address] [place], [place] [phone]   [facility]: [facility] [address] [place], [place]  [phone]   [facility] Mental Health [address]  [place], [place] [phone] or [phone]   Substance Abuse Resources Organization         Address  Phone  Notes  Alcohol and Drug Services  [phone]   [facility]  [phone]   [facility]  [phone]   Floydene Flock  [phone]   Residential & Outpatient Substance Abuse Program  [phone]   Psychological Services Organization         Address  Phone  Notes  [facility]   [phone]   [facility]  [phone]   [facility] Health [address], [place] [phone] or [phone]    Mobile Crisis Teams Organization         Address  Phone  Notes  [facility], Mobile Crisis Care Unit  [phone]   [facility]  [address] [place], [place] [phone]   Doristine Locks [address] [place] [phone]    Self-Help/Support Groups Organization         Address  Phone             Notes  [facility] [place] - variety of support groups  336- I7437963 Call for more information  Narcotics Anonymous (NA), [facility] [address], [employer] [place]  2 meetings at this location   Statistician         Address  Phone  Notes  [facility] [address],    [place]  [phone]   [facility]  [address], [place] [address], [place], [place] [phone]   [facility] [address] [place], [place] [phone] Admissions: 8am-3pm M-F  [facility] [address]    , [place] [phone]   [facility] [address] [place], [place], [place] [phone]   [facility] [address]  [place], [place] [phone]   [facility] - Intensive Outpatient [address] Laurell Josephs [address], [place], [place] [phone]   [facility] [address] [place]  [place], [place] [phone] or [phone]   [facility] ([facility]) [address] [place], [place] [phone] Accepts Medicaid  [facility] [place] [address],  [place] [phone] Substance Abuse/Addiction Treatment   [facility] Organization         Address  Phone  Notes  [facility]  [phone]   Angie Fava, PhD [address] [place], [place]   [phone] or  [phone]) [phone]   [facility]   [address] [place], [place] [phone]   [facility] Recovery [address] [place], [place] [phone] Insurance/Medicaid/sponsorship through [facility] and Families [address]                                    [place], [place] [phone] Therapy/tele-psych/case  [facility] [address]   [place], [place] [phone]    Dr. Lolly Mustache  [phone]   [facility]  [employer] [facility] Dept. 1) [address] [place] 2) [address] [place] 3)  [address], [place] [phone]  [phone]   [facility] Child Abuse Hotline (260)257-5026 or [phone] (After Hours)

## 2015-04-23 NOTE — ED Notes (Signed)
Patient presents for right upper abdominal pain radiating across to left upper abdominal pain. Reports 3 episodes of emesis. Describes pain as sharp and shooting, 9/10 pain.

## 2015-04-23 NOTE — ED Provider Notes (Signed)
CSN: [id number]     Arrival date & time 04/23/15  1915 History   First MD Initiated Contact with Patient 04/23/15 2052     Chief Complaint  Patient presents with  . Abdominal Pain     (Consider location/radiation/quality/duration/timing/severity/associated sxs/prior Treatment) HPI   50 year old male with history of alcohol and tobacco abuse, CAD with history of STEMI status post RCA stent November 2015, recent alcoholic pancreatitis recently admitted to the hospital and discharged on 04/16/2015 who presents to ED today with complaint of abdominal pain. Patient states since December 25 he has had persistent abdominal pain. He described his pain as a sharp throbbing sensation on his right mid abdomen radiating across his abdomen. Pain seems mildly improved with drinking alcohol and he admits to drinking a beer earlier today. He endorsed nausea and has vomited 3 episodes of emesis that is nonbloody nonbilious as well as having his normal bowel movement. He endorsed having night sweats, has lost 20 pounds of weight within the past several months, and is currently concerned for possible cancer in his abdomen as he was told he had a pancreatic mass requiring further imaging. Patient states while he was hospitalized he was supposed to have a scan to evaluate his pancreas but it was rescheduled to be done outpatient in a month. Patient states his pain is currently sharp and shooting, 9 out of 10 and he would like to be admitted for further evaluation of his condition. Patient currently denies having headache, chest pain, difficulty breathing, back pain, dysuria, or rash.    Past Medical History  Diagnosis Date  . Tobacco abuse   . Coronary artery disease   . Anginal pain (HCC)   . GERD (gastroesophageal reflux disease)   . NSTEMI (non-ST elevated myocardial infarction) (HCC) 01/2014  . Pancreatitis   . PTSD (post-traumatic stress disorder)    Past Surgical History  Procedure Laterality Date  .  Coronary angioplasty with stent placement  01/18/2014    "1"  . Left heart catheterization with coronary angiogram N/A 01/18/2014    Procedure: LEFT HEART CATHETERIZATION WITH CORONARY ANGIOGRAM;  Surgeon: Runell Gess, MD;  [facility];  Service: Cardiovascular;  Laterality: N/A;   Family History  Problem Relation Age of Onset  . Cancer Mother     Lung cancer  . Coronary artery disease Mother   . Heart failure Maternal Grandmother    Social History  Substance Use Topics  . Smoking status: Current Every Day Smoker -- 1.00 packs/day for 31 years    Types: Cigarettes  . Smokeless tobacco: Never Used  . Alcohol Use: No     Comment: Last use 03/13/15    Review of Systems  All other systems reviewed and are negative.     Allergies  Ibuprofen and Shellfish allergy  Home Medications   Prior to Admission medications   Medication Sig Start Date End Date Taking? Authorizing Provider  aspirin EC 81 MG tablet Take 1 tablet (81 mg total) by mouth daily. 04/09/15  Yes Albertine Grates, MD  Aspirin-Salicylamide-Caffeine (BC HEADACHE POWDER PO) Take 1 Package by mouth daily as needed (pain).    Yes Historical Provider, MD  HYDROcodone-acetaminophen (NORCO/VICODIN) 5-325 MG tablet Take 1 tab every 6-8 hours as needed for pain 04/19/15  Yes Catarina Hartshorn, MD  metoprolol tartrate (LOPRESSOR) 25 MG tablet Take 1 tablet (25 mg total) by mouth 2 (two) times daily. 04/19/15  Yes Catarina Hartshorn, MD  pantoprazole (PROTONIX) 40 MG tablet Take 1 tab  by mouth every morning. 03/28/15  Yes Amy S Esterwood, PA-C  clopidogrel (PLAVIX) 75 MG tablet Take 1 tablet (75 mg total) by mouth daily. 04/19/15   Catarina Hartshorn, MD   BP 150/125 mmHg  Pulse 103  Temp(Src) 97.6 F (36.4 C) (Oral)  Resp 18  SpO2 99% Physical Exam  Constitutional: He appears well-developed and well-nourished. No distress.  HENT:  Head: Atraumatic.  Eyes: Conjunctivae are normal.  Neck: Neck supple.  Cardiovascular: Normal rate and regular rhythm.    Pulmonary/Chest: Effort normal and breath sounds normal.  Abdominal: Soft. Bowel sounds are normal. He exhibits no distension. There is tenderness (Tenderness to right mid abdomen on palpation without guarding or rebound tenderness. No obvious mass appreciated. Negative Murphy sign, no pain at McBurney's point.).  Neurological: He is alert.  Skin: No rash noted.  Psychiatric: He has a normal mood and affect.  Nursing note and vitals reviewed.   ED Course  Procedures (including critical care time) Labs Review Labs Reviewed  LIPASE, BLOOD - Abnormal; Notable for the following:    Lipase 60 (*)    All other components within normal limits  COMPREHENSIVE METABOLIC PANEL - Abnormal; Notable for the following:    Alkaline Phosphatase 132 (*)    All other components within normal limits  CBC - Abnormal; Notable for the following:    Hemoglobin 17.4 (*)    All other components within normal limits  URINALYSIS, ROUTINE W REFLEX MICROSCOPIC (NOT AT [facility]) - Abnormal; Notable for the following:    APPearance CLOUDY (*)    All other components within normal limits    Imaging Review No results found. I have personally reviewed and evaluated these images and lab results as part of my medical decision-making.   EKG Interpretation None      MDM   Final diagnoses:  Abdominal pain, unspecified abdominal location  Non-intractable vomiting with nausea, vomiting of unspecified type    BP 147/90 mmHg  Pulse 85  Temp(Src) 98 F (36.7 C) (Oral)  Resp 20  SpO2 98%   9:28 PM Patient with history of alcohol abuse, possible pancreatic mass here with persistent abdominal pain. He was hospitalized last week presents today with persistent abdominal pain and admits to drinking alcohol. He was to have the EUS performed to further evaluate his pancreatic mass. At this time as is chronic in nature, since he is complaining of nausea vomiting, antinausea medication along with IV fluid and pain  medication given. Workup initiated. Plan to consult GI specialist however I do not think it would change the management at this time.  10:32 PM Pt has scheduled EUS on March 2nd.  He has mildly lipase of 60, improving from prior.  No fever, or leukocytosis.  Pain is chronic in nature, likely aggravated by alcohol use.  Hx of drug seeking behavior.  I discussed with Dr. Clydene Pugh and we both felt pt will need to f/u with his GI specialist outpt for further management of his condition.  Additional narcotic is not indicated at this time.    11:27 PM Pain has improved but not fully resolved.  I encourage pt to f/u with GI specialist for further care.  Avoid alcohol use as it can worsening his sxs.    Fayrene Helper, PA-C 04/23/15 1610  Lyndal Pulley, MD 04/24/15 1256

## 2015-04-26 ENCOUNTER — Ambulatory Visit: Payer: BLUE CROSS/BLUE SHIELD | Admitting: Physician Assistant

## 2015-05-05 ENCOUNTER — Encounter (HOSPITAL_COMMUNITY): Payer: Self-pay | Admitting: *Deleted

## 2015-05-11 NOTE — Progress Notes (Signed)
05-11-15 1445 Call received from pt desiring to cancel procedure on 05-12-15 with Dr. Christella Hartigan- instructed pt to call office now and speak with them -office number provided.

## 2015-05-12 ENCOUNTER — Telehealth: Payer: Self-pay | Admitting: Gastroenterology

## 2015-05-12 ENCOUNTER — Ambulatory Visit (HOSPITAL_COMMUNITY)
Admission: RE | Admit: 2015-05-12 | Payer: BLUE CROSS/BLUE SHIELD | Source: Ambulatory Visit | Admitting: Gastroenterology

## 2015-05-12 SURGERY — UPPER ENDOSCOPIC ULTRASOUND (EUS) LINEAR
Anesthesia: Monitor Anesthesia Care

## 2015-05-12 NOTE — Telephone Encounter (Signed)
Routing to Patty

## 2015-05-12 NOTE — Telephone Encounter (Signed)
Fyi, he cancelled his EUS today.  Patty, can you call to see if he wants to reschedule this?  Thanks

## 2015-05-13 NOTE — Telephone Encounter (Signed)
Rene KocherRegina can you see if this patient is still interested in follow up EUS with Dr. Christella HartiganJacobs? He no-showed for his exam. Thanks

## 2015-05-13 NOTE — Telephone Encounter (Signed)
Left a message for patient to call back. 

## 2015-05-16 NOTE — Telephone Encounter (Signed)
Left a message for patient to call back. 

## 2015-05-16 NOTE — Telephone Encounter (Signed)
Unable to reach pt letter mailed 

## 2015-05-23 ENCOUNTER — Ambulatory Visit (HOSPITAL_COMMUNITY): Admit: 2015-05-23 | Payer: MEDICAID

## 2015-06-17 ENCOUNTER — Encounter (HOSPITAL_COMMUNITY): Payer: Self-pay | Admitting: Emergency Medicine

## 2015-06-17 ENCOUNTER — Emergency Department (HOSPITAL_COMMUNITY)
Admission: EM | Admit: 2015-06-17 | Discharge: 2015-06-17 | Disposition: A | Discharge status: Home / Self Care | Payer: Self-pay | Attending: Emergency Medicine | Admitting: Emergency Medicine

## 2015-06-17 ENCOUNTER — Emergency Department (HOSPITAL_COMMUNITY): Payer: Self-pay

## 2015-06-17 DIAGNOSIS — R1013 Epigastric pain: Secondary | ICD-10-CM

## 2015-06-17 DIAGNOSIS — K8689 Other specified diseases of pancreas: Secondary | ICD-10-CM | POA: Insufficient documentation

## 2015-06-17 DIAGNOSIS — K921 Melena: Secondary | ICD-10-CM | POA: Insufficient documentation

## 2015-06-17 DIAGNOSIS — F17210 Nicotine dependence, cigarettes, uncomplicated: Secondary | ICD-10-CM | POA: Insufficient documentation

## 2015-06-17 DIAGNOSIS — Z7982 Long term (current) use of aspirin: Secondary | ICD-10-CM | POA: Insufficient documentation

## 2015-06-17 DIAGNOSIS — Z79899 Other long term (current) drug therapy: Secondary | ICD-10-CM | POA: Insufficient documentation

## 2015-06-17 DIAGNOSIS — I25119 Atherosclerotic heart disease of native coronary artery with unspecified angina pectoris: Secondary | ICD-10-CM | POA: Insufficient documentation

## 2015-06-17 DIAGNOSIS — I252 Old myocardial infarction: Secondary | ICD-10-CM | POA: Insufficient documentation

## 2015-06-17 DIAGNOSIS — Z9861 Coronary angioplasty status: Secondary | ICD-10-CM | POA: Insufficient documentation

## 2015-06-17 DIAGNOSIS — Z8659 Personal history of other mental and behavioral disorders: Secondary | ICD-10-CM | POA: Insufficient documentation

## 2015-06-17 DIAGNOSIS — K219 Gastro-esophageal reflux disease without esophagitis: Secondary | ICD-10-CM | POA: Insufficient documentation

## 2015-06-17 LAB — COMPREHENSIVE METABOLIC PANEL
ALK PHOS: 101 U/L (ref 38–126)
ALT: 89 U/L — AB (ref 17–63)
ANION GAP: 8 (ref 5–15)
AST: 118 U/L — ABNORMAL HIGH (ref 15–41)
Albumin: 3.7 g/dL (ref 3.5–5.0)
BILIRUBIN TOTAL: 0.6 mg/dL (ref 0.3–1.2)
BUN: 15 mg/dL (ref 6–20)
CALCIUM: 8.6 mg/dL — AB (ref 8.9–10.3)
CO2: 24 mmol/L (ref 22–32)
CREATININE: 0.96 mg/dL (ref 0.61–1.24)
Chloride: 109 mmol/L (ref 101–111)
Glucose, Bld: 101 mg/dL — ABNORMAL HIGH (ref 65–99)
Potassium: 4.6 mmol/L (ref 3.5–5.1)
Sodium: 141 mmol/L (ref 135–145)
TOTAL PROTEIN: 6.4 g/dL — AB (ref 6.5–8.1)

## 2015-06-17 LAB — CBC
HCT: 45 % (ref 39.0–52.0)
Hemoglobin: 15.6 g/dL (ref 13.0–17.0)
MCH: 33.1 pg (ref 26.0–34.0)
MCHC: 34.7 g/dL (ref 30.0–36.0)
MCV: 95.5 fL (ref 78.0–100.0)
PLATELETS: 261 10*3/uL (ref 150–400)
RBC: 4.71 MIL/uL (ref 4.22–5.81)
RDW: 13.2 % (ref 11.5–15.5)
WBC: 6 10*3/uL (ref 4.0–10.5)

## 2015-06-17 LAB — URINALYSIS, ROUTINE W REFLEX MICROSCOPIC
Bilirubin Urine: NEGATIVE
Glucose, UA: NEGATIVE mg/dL
HGB URINE DIPSTICK: NEGATIVE
Ketones, ur: NEGATIVE mg/dL
LEUKOCYTES UA: NEGATIVE
NITRITE: NEGATIVE
PROTEIN: NEGATIVE mg/dL
Specific Gravity, Urine: 1.017 (ref 1.005–1.030)
pH: 5.5 (ref 5.0–8.0)

## 2015-06-17 LAB — TYPE AND SCREEN
ABO/RH(D): O POS
ANTIBODY SCREEN: NEGATIVE

## 2015-06-17 LAB — ABO/RH: ABO/RH(D): O POS

## 2015-06-17 LAB — LIPASE, BLOOD: Lipase: 33 U/L (ref 11–51)

## 2015-06-17 MED ORDER — HYDROMORPHONE HCL 1 MG/ML IJ SOLN
1.0000 mg | Freq: Once | INTRAMUSCULAR | Status: AC
  Administered 2015-06-17: 1 mg via INTRAVENOUS
  Filled 2015-06-17: qty 1

## 2015-06-17 MED ORDER — MORPHINE SULFATE (PF) 4 MG/ML IV SOLN
4.0000 mg | Freq: Once | INTRAVENOUS | Status: AC
  Administered 2015-06-17: 4 mg via INTRAVENOUS
  Filled 2015-06-17: qty 1

## 2015-06-17 MED ORDER — ONDANSETRON HCL 4 MG/2ML IJ SOLN
4.0000 mg | Freq: Once | INTRAMUSCULAR | Status: AC
  Administered 2015-06-17: 4 mg via INTRAVENOUS
  Filled 2015-06-17: qty 2

## 2015-06-17 MED ORDER — OXYCODONE-ACETAMINOPHEN 5-325 MG PO TABS: 1.0000 | ORAL_TABLET | Freq: Four times a day (QID) | ORAL | Status: DC | PRN

## 2015-06-17 MED ORDER — IOPAMIDOL (ISOVUE-300) INJECTION 61%
100.0000 mL | Freq: Once | INTRAVENOUS | Status: AC | PRN
  Administered 2015-06-17: 100 mL via INTRAVENOUS

## 2015-06-17 MED ORDER — SODIUM CHLORIDE 0.9 % IV BOLUS (SEPSIS)
1000.0000 mL | Freq: Once | INTRAVENOUS | Status: AC
  Administered 2015-06-17: 1000 mL via INTRAVENOUS

## 2015-06-17 NOTE — Progress Notes (Signed)
EDCM spoke to patient at bedside. Patient confirms he does not [place] a pcp or [place].  [facility], informed patient of services there.  EDCM also provided patient with list of pcps who accept self pay patients, list of discount pharmacies and websites needymeds.org and GoodRX.com for medication assistance, phone number to inquire about the orange card, phone number to inquire about Mediciad, phone number to inquire about the Affordable Care Act, financial resources in the community such as local churches, [employer], urban ministries, and dental assistance for uninsured patients.  Patient thankful for resources.  No further EDCM needs at this time.  Patient reports he was unable to make the appointment for a scope procedure with Dr. Larae GroomsJacob's in March as he didn't have a ride.  Patient reports he would have taken the bus to his appointment, however he said that someone has to sign him in and be available that day to take patient home.

## 2015-06-17 NOTE — ED Notes (Signed)
Patient transported to CT 

## 2015-06-17 NOTE — Discharge Instructions (Signed)
Percocet as prescribed as needed for pain.  Clear liquid diet for the next 48 hours.  Call Dr. Christella HartiganJacobs office on Monday to arrange a follow-up appointment as soon as possible to discuss endoscopic ultrasound as previously recommended.   Abdominal Pain, Adult Many things can cause abdominal pain. Usually, abdominal pain is not caused by a disease and will improve without treatment. It can often be observed and treated at home. Your health care provider will do a physical exam and possibly order blood tests and X-rays to help determine the seriousness of your pain. However, in many cases, more time must pass before a clear cause of the pain can be found. Before that point, your health care provider may not know if you need more testing or further treatment. HOME CARE INSTRUCTIONS Monitor your abdominal pain for any changes. The following actions may help to alleviate any discomfort you are experiencing:  Only take over-the-counter or prescription medicines as directed by your health care provider.  Do not take laxatives unless directed to do so by your health care provider.  Try a clear liquid diet (broth, tea, or water) as directed by your health care provider. Slowly move to a bland diet as tolerated. SEEK MEDICAL CARE IF:  You have unexplained abdominal pain.  You have abdominal pain associated with nausea or diarrhea.  You have pain when you urinate or have a bowel movement.  You experience abdominal pain that wakes you in the night.  You have abdominal pain that is worsened or improved by eating food.  You have abdominal pain that is worsened with eating fatty foods.  You have a fever. SEEK IMMEDIATE MEDICAL CARE IF:  Your pain does not go away within 2 hours.  You keep throwing up (vomiting).  Your pain is felt only in portions of the abdomen, such as the right side or the left lower portion of the abdomen.  You pass bloody or black tarry stools. MAKE SURE  YOU:  Understand these instructions.  Will watch your condition.  Will get help right away if you are not doing well or get worse.   This information is not intended to replace advice given to you by your health care provider. Make sure you discuss any questions you have with your health care provider.   Document Released: 12/06/2004 Document Revised: 11/17/2014 Document Reviewed: 11/05/2012 Elsevier Interactive Patient Education [employer]2016 Elsevier Inc.

## 2015-06-17 NOTE — ED Notes (Signed)
Pt informed that urine needed for UA, states he is not unable now but will try again.

## 2015-06-17 NOTE — ED Notes (Signed)
Patient c/o severe right sided belly pain, left side body numbness, emesis that had "reddness to it" and bloody stool x2 today, and sinus infection. Patient has hx of pancreatitis. Patient has been using tylenol at home for pain.

## 2015-06-17 NOTE — ED Provider Notes (Signed)
CSN: [id number]     Arrival date & time 06/17/15  1945 History   First MD Initiated Contact with Patient 06/17/15 2027     Chief Complaint  Patient presents with  . Abdominal Pain    hx pancreatitis   . Rectal Bleeding    x2 today  . 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 He was recently admitted for pancreatitis, then discharged with GI follow-up for possible endoscopy, however this never happened as the patient states he could not get a ride. He returns tonight with continued pain that has worsened over the past 2 days. He reports nausea and vomiting. He denies any fevers or chills.  Patient is a 50 y.o. male presenting with abdominal pain, hematochezia, and sinusitis. The history is provided by the patient.  Abdominal Pain Pain location:  RUQ and RLQ Pain quality: stabbing   Pain radiates to:  Does not radiate Pain severity:  Severe Onset quality:  Gradual Duration: 3 months. Timing:  Constant Progression:  Worsening Context: alcohol use   Relieved by:  Nothing Worsened by:  Nothing tried Associated symptoms: hematochezia   Rectal Bleeding Associated symptoms: abdominal pain   Sinusitis   Past Medical History  Diagnosis Date  . Tobacco abuse   . Coronary artery disease   . Anginal pain (HCC)   . GERD (gastroesophageal reflux disease)   . NSTEMI (non-ST elevated myocardial infarction) (HCC) 01/2014  . Pancreatitis   . PTSD (post-traumatic stress disorder)    Past Surgical History  Procedure Laterality Date  . Coronary angioplasty with stent placement  01/18/2014    "1"  . Left heart catheterization with coronary angiogram N/A 01/18/2014    Procedure: LEFT HEART CATHETERIZATION WITH CORONARY ANGIOGRAM;  Surgeon: Runell GessJonathan J Berry, MD;  [facility];  Service:  Cardiovascular;  Laterality: N/A;   Family History  Problem Relation Age of Onset  . Cancer Mother     Lung cancer  . Coronary artery disease Mother   . Heart failure Maternal Grandmother    Social History  Substance Use Topics  . Smoking status: Current Every Day Smoker -- 1.00 packs/day for 31 years    Types: Cigarettes  . Smokeless tobacco: Never Used  . Alcohol Use: No     Comment: Last use 03/13/15    Review of Systems  Gastrointestinal: Positive for abdominal pain and hematochezia.  All other systems reviewed and are negative.     Allergies  Ibuprofen and Shellfish allergy  Home Medications   Prior to Admission medications   Medication Sig Start Date End Date Taking? Authorizing Provider  acetaminophen (TYLENOL) 500 MG tablet Take 1,500 mg by mouth every 6 (six) hours as needed for moderate pain.   Yes Historical Provider, MD  aspirin EC 81 MG tablet Take 1 tablet (81 mg total) by mouth daily. 04/09/15  Yes Albertine GratesFang Xu, MD  metoprolol tartrate (LOPRESSOR) 25 MG tablet Take 1 tablet (25 mg total) by mouth 2 (two) times daily. 04/19/15  Yes Catarina Hartshornavid Tat, MD  clopidogrel (PLAVIX) 75 MG tablet Take 1 tablet (75 mg total) by mouth daily. 04/19/15   Catarina Hartshornavid Tat, MD  dicyclomine (BENTYL) 20 MG tablet Take 1 tablet (20 mg total) by mouth 2 (two) times daily. 04/23/15   Fayrene HelperBowie Tran, PA-C  HYDROcodone-acetaminophen (NORCO/VICODIN) 5-325 MG tablet Take 1 tab every 6-8 hours as needed for  pain Patient not taking: Reported on 06/17/2015 04/19/15   Catarina Hartshorn, MD  pantoprazole (PROTONIX) 40 MG tablet Take 1 tab by mouth every morning. Patient not taking: Reported on 06/17/2015 03/28/15   Amy S Esterwood, PA-C  promethazine (PHENERGAN) 25 MG tablet Take 1 tablet (25 mg total) by mouth every 6 (six) hours as needed for nausea. Patient not taking: Reported on 06/17/2015 04/23/15   Fayrene Helper, PA-C   BP 135/103 mmHg  Pulse 105  Temp(Src) 97.3 F (36.3 C) (Oral)  Resp 20  SpO2 99% Physical Exam   Constitutional: He is oriented to person, place, and time. He appears well-developed and well-nourished. No distress.  HENT:  Head: Normocephalic and atraumatic.  Neck: Normal range of motion. Neck supple.  Cardiovascular: Normal rate, regular rhythm and normal heart sounds.   No murmur heard. Pulmonary/Chest: Effort normal and breath sounds normal. No respiratory distress. He has no wheezes.  Abdominal: Soft. Bowel sounds are normal. He exhibits no distension. There is tenderness. There is no rebound and no guarding.  There is tenderness to palpation to the right upper quadrant and right lower quadrant. There is no rebound and no guarding.  Musculoskeletal: Normal range of motion. He exhibits no edema.  Neurological: He is alert and oriented to person, place, and time.  Skin: Skin is warm and dry. He is not diaphoretic.  Nursing note and vitals reviewed.   ED Course  Procedures (including critical care time) Labs Review Labs Reviewed  LIPASE, BLOOD  COMPREHENSIVE METABOLIC PANEL  CBC  URINALYSIS, ROUTINE W REFLEX MICROSCOPIC (NOT AT [facility])  POC OCCULT BLOOD, ED  TYPE AND SCREEN    Imaging Review No results found. I have personally reviewed and evaluated these images and lab results as part of my medical decision-making.    MDM   Final diagnoses:  None    Patient presents with complaints of abdominal pain. He has a known abnormality in his pancreatic head which is suspicious for malignancy. This has been known for several months, however the patient has not followed through with gastroenterology for the recommended endoscopy. He tells me this is because he has not had a ride to the facility. Nothing in the workup this evening appears emergent. He has no white count, normal lipase, and no fever. His CT scan again shows the abnormality within the pancreatic head with mild adjacent soft tissue stranding. He will be discharged with pain medication and clear liquid diet. I have  also strongly advised him to call Dr. Christella Hartigan to make arrangements for this endoscopic ultrasound that was recommended last month.    Geoffery Lyons, MD 06/17/15 2328

## 2015-08-05 ENCOUNTER — Emergency Department (HOSPITAL_COMMUNITY): Payer: Self-pay

## 2015-08-05 ENCOUNTER — Emergency Department (HOSPITAL_COMMUNITY)
Admission: EM | Admit: 2015-08-05 | Discharge: 2015-08-05 | Disposition: A | Discharge status: Home / Self Care | Payer: Self-pay | Attending: Emergency Medicine | Admitting: Emergency Medicine

## 2015-08-05 ENCOUNTER — Encounter (HOSPITAL_COMMUNITY): Payer: Self-pay | Admitting: Emergency Medicine

## 2015-08-05 DIAGNOSIS — F17210 Nicotine dependence, cigarettes, uncomplicated: Secondary | ICD-10-CM | POA: Insufficient documentation

## 2015-08-05 DIAGNOSIS — R1013 Epigastric pain: Secondary | ICD-10-CM | POA: Insufficient documentation

## 2015-08-05 DIAGNOSIS — R630 Anorexia: Secondary | ICD-10-CM | POA: Insufficient documentation

## 2015-08-05 DIAGNOSIS — Z8719 Personal history of other diseases of the digestive system: Secondary | ICD-10-CM | POA: Insufficient documentation

## 2015-08-05 DIAGNOSIS — R1012 Left upper quadrant pain: Secondary | ICD-10-CM | POA: Insufficient documentation

## 2015-08-05 DIAGNOSIS — I25119 Atherosclerotic heart disease of native coronary artery with unspecified angina pectoris: Secondary | ICD-10-CM | POA: Insufficient documentation

## 2015-08-05 DIAGNOSIS — I252 Old myocardial infarction: Secondary | ICD-10-CM | POA: Insufficient documentation

## 2015-08-05 DIAGNOSIS — Z8659 Personal history of other mental and behavioral disorders: Secondary | ICD-10-CM | POA: Insufficient documentation

## 2015-08-05 DIAGNOSIS — R079 Chest pain, unspecified: Secondary | ICD-10-CM | POA: Insufficient documentation

## 2015-08-05 DIAGNOSIS — R6883 Chills (without fever): Secondary | ICD-10-CM | POA: Insufficient documentation

## 2015-08-05 DIAGNOSIS — R112 Nausea with vomiting, unspecified: Secondary | ICD-10-CM | POA: Insufficient documentation

## 2015-08-05 DIAGNOSIS — R109 Unspecified abdominal pain: Secondary | ICD-10-CM

## 2015-08-05 DIAGNOSIS — Z9861 Coronary angioplasty status: Secondary | ICD-10-CM | POA: Insufficient documentation

## 2015-08-05 LAB — HEPATIC FUNCTION PANEL
ALT: 17 U/L (ref 17–63)
AST: 17 U/L (ref 15–41)
Albumin: 3.2 g/dL — ABNORMAL LOW (ref 3.5–5.0)
Alkaline Phosphatase: 74 U/L (ref 38–126)
Total Bilirubin: 0.1 mg/dL — ABNORMAL LOW (ref 0.3–1.2)
Total Protein: 5.7 g/dL — ABNORMAL LOW (ref 6.5–8.1)

## 2015-08-05 LAB — BASIC METABOLIC PANEL
ANION GAP: 5 (ref 5–15)
BUN: 7 mg/dL (ref 6–20)
CHLORIDE: 109 mmol/L (ref 101–111)
CO2: 26 mmol/L (ref 22–32)
CREATININE: 1 mg/dL (ref 0.61–1.24)
Calcium: 8.6 mg/dL — ABNORMAL LOW (ref 8.9–10.3)
GFR calc non Af Amer: >60 mL/min (ref >60)
GLUCOSE: 108 mg/dL — AB (ref 65–99)
Potassium: 3.7 mmol/L (ref 3.5–5.1)
SODIUM: 140 mmol/L (ref 135–145)

## 2015-08-05 LAB — I-STAT TROPONIN, ED: Troponin i, poc: 0 ng/mL (ref 0.00–0.08)

## 2015-08-05 LAB — CBC
HCT: 40.8 % (ref 39.0–52.0)
Hemoglobin: 13.2 g/dL (ref 13.0–17.0)
MCH: 31.3 pg (ref 26.0–34.0)
MCHC: 32.4 g/dL (ref 30.0–36.0)
MCV: 96.7 fL (ref 78.0–100.0)
PLATELETS: 281 10*3/uL (ref 150–400)
RBC: 4.22 MIL/uL (ref 4.22–5.81)
RDW: 12.9 % (ref 11.5–15.5)
WBC: 10.9 10*3/uL — ABNORMAL HIGH (ref 4.0–10.5)

## 2015-08-05 LAB — LIPASE, BLOOD: Lipase: 35 U/L (ref 11–51)

## 2015-08-05 MED ORDER — ONDANSETRON HCL 4 MG/2ML IJ SOLN
4.0000 mg | Freq: Once | INTRAMUSCULAR | Status: AC
  Administered 2015-08-05: 4 mg via INTRAVENOUS
  Filled 2015-08-05: qty 2

## 2015-08-05 MED ORDER — SODIUM CHLORIDE 0.9 % IV BOLUS (SEPSIS)
1000.0000 mL | Freq: Once | INTRAVENOUS | Status: AC
  Administered 2015-08-05: 1000 mL via INTRAVENOUS

## 2015-08-05 MED ORDER — PROMETHAZINE HCL 25 MG PO TABS: 25.0000 mg | ORAL_TABLET | Freq: Four times a day (QID) | ORAL | Status: DC | PRN

## 2015-08-05 MED ORDER — FENTANYL CITRATE (PF) 100 MCG/2ML IJ SOLN
50.0000 ug | Freq: Once | INTRAMUSCULAR | Status: AC
  Administered 2015-08-05: 50 ug via INTRAVENOUS
  Filled 2015-08-05: qty 2

## 2015-08-05 NOTE — Discharge Instructions (Signed)

## 2015-08-05 NOTE — ED Provider Notes (Signed)
CSN: [id number]     Arrival date & time 08/05/15  [id number] History   First MD Initiated Contact with Patient 08/05/15 0719     Chief Complaint  Patient presents with  . Chest Pain  . Abdominal Pain      Patient is a 50 y.o. male presenting with chest pain and abdominal pain. The history is provided by the patient.  Chest Pain Pain location:  L chest Associated symptoms: abdominal pain, nausea and vomiting   Associated symptoms: no back pain, no headache, no numbness, no shortness of breath and no weakness   Abdominal Pain Associated symptoms: chest pain, chills, nausea and vomiting   Associated symptoms: no diarrhea and no shortness of breath   Patient presents with chest pain and left upper abdominal pain. States the pain on his chest is dull. States it feels like his reflux but did not go away. Started last night. No radiation.  He has had nausea and vomiting due to recurrent pancreatitis. States that started to hurt yesterday also. Previous alcoholic pancreatitis but states he has not had a drink in 45 days. States he had some chills yesterday. No fevers. No blood in the stool. No hematemesis. Nothing makes the pain better.  Past Medical History  Diagnosis Date  . Tobacco abuse   . Coronary artery disease   . Anginal pain (HCC)   . GERD (gastroesophageal reflux disease)   . NSTEMI (non-ST elevated myocardial infarction) (HCC) 01/2014  . Pancreatitis   . PTSD (post-traumatic stress disorder)    Past Surgical History  Procedure Laterality Date  . Coronary angioplasty with stent placement  01/18/2014    "1"  . Left heart catheterization with coronary angiogram N/A 01/18/2014    Procedure: LEFT HEART CATHETERIZATION WITH CORONARY ANGIOGRAM;  Surgeon: Runell GessJonathan J Berry, MD;  [facility];  Service: Cardiovascular;  Laterality: N/A;   Family History  Problem Relation Age of Onset  . Cancer Mother     Lung cancer  . Coronary artery disease Mother   . Heart failure Maternal  Grandmother    Social History  Substance Use Topics  . Smoking status: Current Every Day Smoker -- 1.00 packs/day for 31 years    Types: Cigarettes  . Smokeless tobacco: Never Used  . Alcohol Use: No     Comment: last use "45 days ago"    Review of Systems  Constitutional: Positive for chills and appetite change. Negative for activity change.  Eyes: Negative for pain.  Respiratory: Negative for chest tightness and shortness of breath.   Cardiovascular: Positive for chest pain. Negative for leg swelling.  Gastrointestinal: Positive for nausea, vomiting and abdominal pain. Negative for diarrhea.  Genitourinary: Negative for flank pain.  Musculoskeletal: Negative for back pain and neck stiffness.  Skin: Negative for rash.  Neurological: Negative for weakness, numbness and headaches.  Psychiatric/Behavioral: Negative for behavioral problems.      Allergies  Ibuprofen and Shellfish allergy  Home Medications   Prior to Admission medications   Medication Sig Start Date End Date Taking? Authorizing Provider  acetaminophen (TYLENOL) 500 MG tablet Take 1,500 mg by mouth every 6 (six) hours as needed for moderate pain.   Yes Historical Provider, MD  diphenhydrAMINE (BENADRYL) 25 mg capsule Take 50 mg by mouth every 6 (six) hours as needed for allergies.   Yes Historical Provider, MD  promethazine (PHENERGAN) 25 MG tablet Take 1 tablet (25 mg total) by mouth every 6 (six) hours as needed for nausea. 08/05/15  Benjiman Core, MD   BP 115/64 mmHg  Pulse 83  Temp(Src) 97.7 F (36.5 C) (Oral)  Resp 12  Ht  (1.778 m)  Wt 150 lb (68.04 kg)  BMI 21.52 kg/m2  SpO2 98% Physical Exam  Constitutional: He is oriented to person, place, and time. He appears well-developed and well-nourished.  HENT:  Head: Normocephalic and atraumatic.  Eyes: EOM are normal. Pupils are equal, round, and reactive to light.  Neck: Normal range of motion.  Cardiovascular: Normal rate and regular rhythm.    Pulmonary/Chest: Effort normal and breath sounds normal.  Abdominal: Soft. Bowel sounds are normal. He exhibits no distension. There is tenderness.  Epigastric to left upper quadrant tenderness without rebound or guarding.  Musculoskeletal: Normal range of motion. He exhibits no edema.  Neurological: He is alert and oriented to person, place, and time. No cranial nerve deficit.  Skin: Skin is warm and dry.  Nursing note and vitals reviewed.   ED Course  Procedures (including critical care time) Labs Review Labs Reviewed  BASIC METABOLIC PANEL - Abnormal; Notable for the following:    Glucose, Bld 108 (*)    Calcium 8.6 (*)    All other components within normal limits  CBC - Abnormal; Notable for the following:    WBC 10.9 (*)    All other components within normal limits  HEPATIC FUNCTION PANEL - Abnormal; Notable for the following:    Total Protein 5.7 (*)    Albumin 3.2 (*)    Total Bilirubin 0.1 (*)    Bilirubin, Direct <0.1 (*)    All other components within normal limits  LIPASE, BLOOD  I-STAT TROPOININ, ED    Imaging Review Dg Chest 2 View  08/05/2015  CLINICAL DATA:  Chest pain, fever, nausea for 4 days EXAM: CHEST  2 VIEW COMPARISON:  04/16/2015 FINDINGS: Calcified granuloma in the right upper lobe. Lungs otherwise clear. Heart is normal size. No effusions or acute bony abnormality. IMPRESSION: No active cardiopulmonary disease. Electronically Signed   By: Charlett Nose M.D.   On: 08/05/2015 07:39   I have personally reviewed and evaluated these images and lab results as part of my medical decision-making.   EKG Interpretation   Date/Time:  Friday Aug 05 2015 07:17:03 EDT Ventricular Rate:  82 PR Interval:  161 QRS Duration: 95 QT Interval:  411 QTC Calculation: 480 R Axis:   58 Text Interpretation:  Sinus rhythm Borderline prolonged QT interval No  significant change since last tracing Confirmed by Sherilee Smotherman  MD, Deontrey Massi  [phone]) on 08/05/2015 7:25:55 AM       MDM   Final diagnoses:  Abdominal pain, acute    Chest and abdominal pain. EKG and labs reassuring for heart. Previous pancreatitis but normal lipase at this time. Feels better after treatment will be discharged home.    Benjiman Core, MD 08/05/15 [phone]

## 2015-08-05 NOTE — ED Notes (Signed)
Pt presents from home with centralized CP without radiation that began last night at 10pm; pt was given 324mg  ASA and 3 SL NTG with some improvement; pt also reports some SOB and nauseap; pt also c/o abd pain with hx of pancreatitis

## 2015-08-05 NOTE — ED Notes (Signed)
Pt is ins tabler condition upon d/c and ambulates from ED.

## 2016-01-28 ENCOUNTER — Encounter (HOSPITAL_COMMUNITY): Payer: Self-pay | Admitting: Emergency Medicine

## 2016-01-28 ENCOUNTER — Emergency Department (HOSPITAL_COMMUNITY)
Admission: EM | Admit: 2016-01-28 | Discharge: 2016-01-29 | Disposition: A | Discharge status: Home / Self Care | Payer: Self-pay | Attending: Emergency Medicine | Admitting: Emergency Medicine

## 2016-01-28 DIAGNOSIS — I2510 Atherosclerotic heart disease of native coronary artery without angina pectoris: Secondary | ICD-10-CM | POA: Insufficient documentation

## 2016-01-28 DIAGNOSIS — Z791 Long term (current) use of non-steroidal anti-inflammatories (NSAID): Secondary | ICD-10-CM | POA: Insufficient documentation

## 2016-01-28 DIAGNOSIS — R109 Unspecified abdominal pain: Secondary | ICD-10-CM | POA: Insufficient documentation

## 2016-01-28 DIAGNOSIS — M545 Low back pain: Secondary | ICD-10-CM | POA: Insufficient documentation

## 2016-01-28 DIAGNOSIS — F17210 Nicotine dependence, cigarettes, uncomplicated: Secondary | ICD-10-CM | POA: Insufficient documentation

## 2016-01-28 DIAGNOSIS — R202 Paresthesia of skin: Secondary | ICD-10-CM | POA: Insufficient documentation

## 2016-01-28 DIAGNOSIS — R112 Nausea with vomiting, unspecified: Secondary | ICD-10-CM | POA: Insufficient documentation

## 2016-01-28 DIAGNOSIS — R000 Tachycardia, unspecified: Secondary | ICD-10-CM | POA: Insufficient documentation

## 2016-01-28 MED ORDER — ONDANSETRON 8 MG PO TBDP
8.0000 mg | ORAL_TABLET | Freq: Once | ORAL | Status: AC
  Administered 2016-01-28: 8 mg via ORAL
  Filled 2016-01-28: qty 1

## 2016-01-28 NOTE — ED Notes (Signed)
Pt experienced episode of emesis and this RN was given a verbal order for zofran.

## 2016-01-28 NOTE — ED Triage Notes (Signed)
Pt reports waking up this morning with pain in lower back that started after waking. Pt denies any injury to lower back. Pt states pain radiates into legs. Pt ambulatory to triage. Pt denies any urinary symptoms at this time.

## 2016-01-29 LAB — CBC WITH DIFFERENTIAL/PLATELET
Basophils Absolute: 0.1 10*3/uL (ref 0.0–0.1)
Basophils Relative: 1 %
EOS ABS: 0.3 10*3/uL (ref 0.0–0.7)
EOS PCT: 4 %
HEMATOCRIT: 45.6 % (ref 39.0–52.0)
Hemoglobin: 15.8 g/dL (ref 13.0–17.0)
Lymphocytes Relative: 26 %
Lymphs Abs: 2.4 10*3/uL (ref 0.7–4.0)
MCH: 31.8 pg (ref 26.0–34.0)
MCHC: 34.6 g/dL (ref 30.0–36.0)
MCV: 91.8 fL (ref 78.0–100.0)
MONO ABS: 0.8 10*3/uL (ref 0.1–1.0)
MONOS PCT: 9 %
NEUTROS ABS: 5.7 10*3/uL (ref 1.7–7.7)
Neutrophils Relative %: 60 %
PLATELETS: 220 10*3/uL (ref 150–400)
RBC: 4.97 MIL/uL (ref 4.22–5.81)
RDW: 14.2 % (ref 11.5–15.5)
WBC: 9.3 10*3/uL (ref 4.0–10.5)

## 2016-01-29 LAB — COMPREHENSIVE METABOLIC PANEL
ALBUMIN: 4.1 g/dL (ref 3.5–5.0)
ALK PHOS: 78 U/L (ref 38–126)
ALT: 17 U/L (ref 17–63)
AST: 23 U/L (ref 15–41)
Anion gap: 9 (ref 5–15)
BILIRUBIN TOTAL: 0.6 mg/dL (ref 0.3–1.2)
BUN: 13 mg/dL (ref 6–20)
CALCIUM: 8.8 mg/dL — AB (ref 8.9–10.3)
CO2: 22 mmol/L (ref 22–32)
CREATININE: 1.12 mg/dL (ref 0.61–1.24)
Chloride: 105 mmol/L (ref 101–111)
GFR calc Af Amer: >60 mL/min (ref >60)
GLUCOSE: 101 mg/dL — AB (ref 65–99)
Potassium: 4 mmol/L (ref 3.5–5.1)
Sodium: 136 mmol/L (ref 135–145)
TOTAL PROTEIN: 6.7 g/dL (ref 6.5–8.1)

## 2016-01-29 LAB — URINALYSIS, ROUTINE W REFLEX MICROSCOPIC
Bilirubin Urine: NEGATIVE
GLUCOSE, UA: NEGATIVE mg/dL
Hgb urine dipstick: NEGATIVE
KETONES UR: NEGATIVE mg/dL
LEUKOCYTES UA: NEGATIVE
NITRITE: NEGATIVE
PH: 5.5 (ref 5.0–8.0)
Protein, ur: NEGATIVE mg/dL
SPECIFIC GRAVITY, URINE: 1.016 (ref 1.005–1.030)

## 2016-01-29 LAB — LIPASE, BLOOD: LIPASE: 27 U/L (ref 11–51)

## 2016-01-29 MED ORDER — DICLOFENAC SODIUM 1 % TD GEL: 2.0000 g | Freq: Four times a day (QID) | TRANSDERMAL | 0 refills | Status: DC

## 2016-01-29 MED ORDER — CYCLOBENZAPRINE HCL 5 MG PO TABS: 5.0000 mg | ORAL_TABLET | Freq: Three times a day (TID) | ORAL | 0 refills | Status: DC | PRN

## 2016-01-29 MED ORDER — KETOROLAC TROMETHAMINE 30 MG/ML IJ SOLN
30.0000 mg | Freq: Once | INTRAMUSCULAR | Status: AC
  Administered 2016-01-29: 30 mg via INTRAMUSCULAR
  Filled 2016-01-29: qty 1

## 2016-01-29 MED ORDER — CYCLOBENZAPRINE HCL 10 MG PO TABS
5.0000 mg | ORAL_TABLET | Freq: Once | ORAL | Status: AC
  Administered 2016-01-29: 5 mg via ORAL
  Filled 2016-01-29: qty 1

## 2016-01-29 MED ORDER — LIDOCAINE 5 % EX PTCH
1.0000 | MEDICATED_PATCH | CUTANEOUS | Status: DC
  Administered 2016-01-29: 1 via TRANSDERMAL
  Filled 2016-01-29: qty 1

## 2016-01-29 MED ORDER — TRAMADOL HCL 50 MG PO TABS
50.0000 mg | ORAL_TABLET | Freq: Once | ORAL | Status: AC
  Administered 2016-01-29: 50 mg via ORAL
  Filled 2016-01-29: qty 1

## 2016-01-29 NOTE — ED Provider Notes (Signed)
[facility]-EMERGENCY DEPT Provider Note   CSN: [id number] Arrival date & time: 01/28/16  2153     History   Chief Complaint Chief Complaint  Patient presents with  . Back Pain    HPI Minerva EndsJonathan Whittle is a 50 y.o. male.  Minerva EndsJonathan Wrage is a 50 y.o. male with h/o CAD, alcohol abuse, pancreatitis, tobacco use, and PTSD presents to ED with complaint of low back pain. Patient reports onset of right low back pain early this morning. Describes the pain as sharp with radiation into right leg. He reports associated tingling in legs and feels "wobbly" walking; however, walking with a steady gait. Laying down improves pain, sitting and movement make pain worse. Denies any recent changes to activity or trauma. He reports associated nausea, had one episode of vomiting in the ED, and complains of abdominal discomfort. He denies fever, unexpected weight loss, h/o cancer, h/o IVDU, loss of bowel or bladder control, saddle anesthesia, chest pain, SOB, diarrhea, constipation, dysuria, hematuria. Normal bowel movement this morning. Has urinated in ED without difficulty. No treatments tried PTA.       Past Medical History:  Diagnosis Date  . Anginal pain (HCC)   . Coronary artery disease   . GERD (gastroesophageal reflux disease)   . NSTEMI (non-ST elevated myocardial infarction) (HCC) 01/2014  . Pancreatitis   . PTSD (post-traumatic stress disorder)   . Tobacco abuse     Patient Active Problem List   Diagnosis Date Noted  . Alcoholic pancreatitis 04/18/2015  . Pancreatic divisum 04/18/2015  . Abdominal pain, acute   . Chest pain 04/16/2015  . RUQ pain 04/16/2015  . Smoker   . Acute pancreatitis 04/06/2015  . Rectal bleeding 04/06/2015  . Hypocalcemia 04/06/2015  . Pancreatitis, acute 03/09/2015  . Alcohol abuse 03/09/2015  . Fatty liver 03/09/2015  . Generalized abdominal pain   . Abdominal pain 03/05/2015  . Pancreatic mass 03/05/2015  . Unstable angina pectoris (HCC) 01/18/2014  .  Unstable angina (HCC) 01/18/2014  . NSTEMI (non-ST elevated myocardial infarction) (HCC) 01/18/2014  . Tobacco abuse     Past Surgical History:  Procedure Laterality Date  . CORONARY ANGIOPLASTY WITH STENT PLACEMENT  01/18/2014   "1"  . LEFT HEART CATHETERIZATION WITH CORONARY ANGIOGRAM N/A 01/18/2014   Procedure: LEFT HEART CATHETERIZATION WITH CORONARY ANGIOGRAM;  Surgeon: Runell GessJonathan J Berry, MD;  [facility];  Service: Cardiovascular;  Laterality: N/A;       Home Medications    Prior to Admission medications   Medication Sig Start Date End Date Taking? Authorizing Provider  acetaminophen (TYLENOL) 500 MG tablet Take 1,500 mg by mouth every 6 (six) hours as needed for moderate pain.    Historical Provider, MD  cyclobenzaprine (FLEXERIL) 5 MG tablet Take 1 tablet (5 mg total) by mouth 3 (three) times daily as needed for muscle spasms. 01/29/16   Lona KettleAshley Laurel Vernadette Stutsman, PA-C  diclofenac sodium (VOLTAREN) 1 % GEL Apply 2 g topically 4 (four) times daily. For up to 7 days 01/29/16   Lona KettleAshley Laurel Jakaria Lavergne, PA-C  diphenhydrAMINE (BENADRYL) 25 mg capsule Take 50 mg by mouth every 6 (six) hours as needed for allergies.    Historical Provider, MD  promethazine (PHENERGAN) 25 MG tablet Take 1 tablet (25 mg total) by mouth every 6 (six) hours as needed for nausea. 08/05/15   Benjiman CoreNathan Pickering, MD    Family History Family History  Problem Relation Age of Onset  . Cancer Mother     Lung cancer  .  Coronary artery disease Mother   . Heart failure Maternal Grandmother     Social History Social History  Substance Use Topics  . Smoking status: Current Every Day Smoker    Packs/day: 1.00    Years: 31.00    Types: Cigarettes  . Smokeless tobacco: Never Used  . Alcohol use 0.6 oz/week    1 Cans of beer per week     Allergies   Ibuprofen and Shellfish allergy   Review of Systems Review of Systems  Constitutional: Negative for chills, diaphoresis and fever.  HENT: Negative for  trouble swallowing.   Eyes: Negative for visual disturbance.  Respiratory: Negative for shortness of breath.   Cardiovascular: Negative for chest pain.  Gastrointestinal: Positive for abdominal pain, nausea and vomiting.  Genitourinary: Negative for discharge, dysuria, hematuria, penile pain, penile swelling, scrotal swelling and testicular pain.  Musculoskeletal: Positive for back pain.  Skin: Negative for rash.  Allergic/Immunologic: Negative for immunocompromised state.  Neurological: Negative for syncope and headaches.       C/o tingling in right leg and feeling "wobbly" while walking; however, walks with a steady gait.      Physical Exam Updated Vital Signs BP 140/92 (BP Location: Left Arm)   Pulse 105   Temp 98.5 F (36.9 C) (Oral)   Resp 16   Ht 5\' 11"  (1.803 m)   Wt 72.6 kg   SpO2 99%   BMI 22.33 kg/m   Physical Exam  Constitutional: He appears well-developed and well-nourished. No distress.  HENT:  Head: Normocephalic and atraumatic.  Mouth/Throat: Oropharynx is clear and moist. No oropharyngeal exudate.  Eyes: Conjunctivae and EOM are normal. Pupils are equal, round, and reactive to light. Right eye exhibits no discharge. Left eye exhibits no discharge. No scleral icterus.  Neck: Normal range of motion and phonation normal. Neck supple. No neck rigidity. Normal range of motion present.  Cardiovascular: Regular rhythm, normal heart sounds and intact distal pulses.  Tachycardia present.   No murmur heard. Pulmonary/Chest: Effort normal and breath sounds normal. No stridor. No respiratory distress. He has no wheezes. He has no rales.  Abdominal: Soft. Bowel sounds are normal. He exhibits no distension. There is no tenderness. There is no rigidity, no rebound, no guarding and no CVA tenderness.  No tenderness to palpation of abdomen. No rebound, guarding, or rigidity.   Musculoskeletal: Normal range of motion.  No midline spinal tenderness. No obvious deformity or step  off. TTP of right paravertebral muscles. Patient is ambulatory with steady gait.   Lymphadenopathy:    He has no cervical adenopathy.  Neurological: He is alert. He is not disoriented. Coordination and gait normal. GCS eye subscore is 4. GCS verbal subscore is 5. GCS motor subscore is 6.  Mental Status:  Alert, thought content appropriate, able to give a coherent history. Speech fluent without evidence of aphasia. Able to follow 2 step commands without difficulty.  Cranial Nerves:  II:  Peripheral visual fields grossly normal, pupils equal, round, reactive to light III,IV, VI: ptosis not present, extra-ocular motions intact bilaterally  V,VII: smile symmetric, facial light touch sensation equal VIII: hearing grossly normal to voice  X: uvula elevates symmetrically  XI: bilateral shoulder shrug symmetric and strong XII: midline tongue extension without fassiculations Motor:  Normal tone. 5/5 in upper and lower extremities bilaterally including strong and equal grip strength and dorsiflexion/plantar flexion Sensory: light touch normal in all extremities. DTRs: patellar 2+ symmetric b/l Cerebellar: normal finger-to-nose with bilateral upper extremities Gait: normal gait  and balance CV: distal pulses palpable throughout   Skin: Skin is warm and dry. He is not diaphoretic.  Psychiatric: He has a normal mood and affect. His behavior is normal.     ED Treatments / Results  Labs (all labs ordered are listed, but only abnormal results are displayed) Labs Reviewed  COMPREHENSIVE METABOLIC PANEL - Abnormal; Notable for the following:       Result Value   Glucose, Bld 101 (*)    Calcium 8.8 (*)    All other components within normal limits  URINALYSIS, ROUTINE W REFLEX MICROSCOPIC (NOT AT [facility])  LIPASE, BLOOD  CBC WITH DIFFERENTIAL/PLATELET    EKG  EKG Interpretation None       Radiology No results found.  Procedures Procedures (including critical care time)  Medications  Ordered in ED Medications  ondansetron (ZOFRAN-ODT) disintegrating tablet 8 mg (8 mg Oral Given 01/28/16 2334)  cyclobenzaprine (FLEXERIL) tablet 5 mg (5 mg Oral Given 01/29/16 0034)  ketorolac (TORADOL) 30 MG/ML injection 30 mg (30 mg Intramuscular Given 01/29/16 0147)  traMADol (ULTRAM) tablet 50 mg (50 mg Oral Given 01/29/16 0155)     Initial Impression / Assessment and Plan / ED Course  I have reviewed the triage vital signs and the nursing notes.  Pertinent labs & imaging results that were available during my care of the patient were reviewed by me and considered in my medical decision making (see chart for details).  Clinical Course     Patient presents to ED with complaint of back pain onset x 1 day. Patient is afebrile and non-toxic appearing in NAD. Vital signs remarkable for slight tachycrdia and high blood pressure - ?secondary to pain. No midline spinal tenderness. TTP of right lumbar paravertebral muscles. No focal neuro deficits on exam - sensation and strength intact, strong and equal dorsiflexion/plantar flexion, 2+ b/l symmetric patellar DTRs, patient ambulates with steady gait. No loss of bowel or bladder control.  Low suspicion for cauda equina.  No fever, weight loss, h/o cancer, IVDU.  Given episode of N/V in ED and h/o alcohol abuse and pancreatitis will check basic labs. Zofran and pain medication given in ED.   U/A negative for UTI. Lipase nml - doubt pancreatitis. Nml CMP - LFTs and bili nml, benign abdomen - doubt acute cholecystitis or cholangitis. No leukocytosis, pt afebrile, benigng abdominal exam - doubt appendicitis. Pt non-toxic appearing, abdomen +BS, soft, non-tender, non-distended, pt tolerating PO fluids - low suspicion for obstruction or perforation. No pulsatile mass, intact distal pulses, low suspicion for AAA at this time. Suspect MSK vs. Arthritis vs. radiculopathy. Discussed results and plan with pt. Conservative management dicussed to include RICE  protocol and pain medicine. Rx voltaren gel and flexeril. Follow up with PCP or ortho if sxs persist, contact information provided. Strict return precautions given. Pt voiced understanding and is agreeable.       Final Clinical Impressions(s) / ED Diagnoses   Final diagnoses:  Acute right-sided low back pain, with sciatica presence unspecified    New Prescriptions Discharge Medication List as of 01/29/2016  1:46 AM    START taking these medications   Details  cyclobenzaprine (FLEXERIL) 5 MG tablet Take 1 tablet (5 mg total) by mouth 3 (three) times daily as needed for muscle spasms., Starting Sun 01/29/2016, Print    diclofenac sodium (VOLTAREN) 1 % GEL Apply 2 g topically 4 (four) times daily. For up to 7 days, Starting Sun 01/29/2016, Print  Lona Kettleshley Laurel Skyanne Welle, PA-C 01/29/16 1148    Alvira MondayErin Schlossman, MD 01/30/16 2117

## 2016-01-29 NOTE — Discharge Instructions (Signed)
Read the information below.  Your labs are re-assuring.  You can take tylenol 650mg  every 6hrs for pain relief. I have prescribed flexeril, this can make you drowsy do not drive after taking.  I have also prescribed voltaren gel, you can apply up to four times a day for 7 days for symptomatic relief.  Ice area for 20 minute increments for the next 2-3 days.  Use the prescribed medication as directed.  Please discuss all new medications with your pharmacist.  It is important to establish a primary provider, I have provided contact information above.  If symptoms persist you can follow up with orthopedics - I have provided their contact information above.   You may return to the Emergency Department at any time for worsening condition or any new symptoms that concern you. Return to ED immediately if you develop fever, loss of bowel/bladder control, loss of sensation on your inner legs or around your rectum, or difficulty walking.

## 2016-03-14 ENCOUNTER — Inpatient Hospital Stay (HOSPITAL_COMMUNITY)
Admission: EM | Admit: 2016-03-14 | Discharge: 2016-03-27 | DRG: 234 | Disposition: A | Discharge status: Skilled Nursing Facility | Payer: Self-pay | Attending: Cardiothoracic Surgery | Admitting: Cardiothoracic Surgery

## 2016-03-14 ENCOUNTER — Encounter (HOSPITAL_COMMUNITY)
Admission: EM | Disposition: A | Discharge status: Skilled Nursing Facility | Payer: Self-pay | Source: Home / Self Care | Attending: Cardiothoracic Surgery

## 2016-03-14 ENCOUNTER — Emergency Department (HOSPITAL_COMMUNITY): Payer: Self-pay

## 2016-03-14 ENCOUNTER — Encounter (HOSPITAL_COMMUNITY): Payer: Self-pay | Admitting: Emergency Medicine

## 2016-03-14 DIAGNOSIS — F17210 Nicotine dependence, cigarettes, uncomplicated: Secondary | ICD-10-CM | POA: Diagnosis present

## 2016-03-14 DIAGNOSIS — I25110 Atherosclerotic heart disease of native coronary artery with unstable angina pectoris: Secondary | ICD-10-CM | POA: Diagnosis present

## 2016-03-14 DIAGNOSIS — F1010 Alcohol abuse, uncomplicated: Secondary | ICD-10-CM | POA: Diagnosis present

## 2016-03-14 DIAGNOSIS — Z951 Presence of aortocoronary bypass graft: Secondary | ICD-10-CM

## 2016-03-14 DIAGNOSIS — Z9119 Patient's noncompliance with other medical treatment and regimen: Secondary | ICD-10-CM

## 2016-03-14 DIAGNOSIS — K861 Other chronic pancreatitis: Secondary | ICD-10-CM | POA: Diagnosis present

## 2016-03-14 DIAGNOSIS — F4310 Post-traumatic stress disorder, unspecified: Secondary | ICD-10-CM | POA: Diagnosis present

## 2016-03-14 DIAGNOSIS — E8770 Fluid overload, unspecified: Secondary | ICD-10-CM | POA: Diagnosis present

## 2016-03-14 DIAGNOSIS — Z590 Homelessness: Secondary | ICD-10-CM

## 2016-03-14 DIAGNOSIS — K219 Gastro-esophageal reflux disease without esophagitis: Secondary | ICD-10-CM | POA: Diagnosis present

## 2016-03-14 DIAGNOSIS — K8689 Other specified diseases of pancreas: Secondary | ICD-10-CM

## 2016-03-14 DIAGNOSIS — Z955 Presence of coronary angioplasty implant and graft: Secondary | ICD-10-CM

## 2016-03-14 DIAGNOSIS — J9811 Atelectasis: Secondary | ICD-10-CM | POA: Diagnosis not present

## 2016-03-14 DIAGNOSIS — I252 Old myocardial infarction: Secondary | ICD-10-CM

## 2016-03-14 DIAGNOSIS — I2510 Atherosclerotic heart disease of native coronary artery without angina pectoris: Secondary | ICD-10-CM | POA: Diagnosis present

## 2016-03-14 DIAGNOSIS — D62 Acute posthemorrhagic anemia: Secondary | ICD-10-CM | POA: Diagnosis not present

## 2016-03-14 DIAGNOSIS — Z09 Encounter for follow-up examination after completed treatment for conditions other than malignant neoplasm: Secondary | ICD-10-CM

## 2016-03-14 DIAGNOSIS — I214 Non-ST elevation (NSTEMI) myocardial infarction: Principal | ICD-10-CM | POA: Diagnosis present

## 2016-03-14 HISTORY — PX: CARDIAC CATHETERIZATION: SHX172

## 2016-03-14 HISTORY — DX: Personal history of other diseases of the digestive system: Z87.19

## 2016-03-14 LAB — CBC WITH DIFFERENTIAL/PLATELET
Basophils Absolute: 0.1 10*3/uL (ref 0.0–0.1)
Basophils Relative: 1 %
EOS PCT: 2 %
Eosinophils Absolute: 0.2 10*3/uL (ref 0.0–0.7)
HCT: 45.8 % (ref 39.0–52.0)
HEMOGLOBIN: 15.7 g/dL (ref 13.0–17.0)
LYMPHS ABS: 2.2 10*3/uL (ref 0.7–4.0)
LYMPHS PCT: 24 %
MCH: 32.5 pg (ref 26.0–34.0)
MCHC: 34.3 g/dL (ref 30.0–36.0)
MCV: 94.8 fL (ref 78.0–100.0)
MONOS PCT: 7 %
Monocytes Absolute: 0.6 10*3/uL (ref 0.1–1.0)
Neutro Abs: 6.1 10*3/uL (ref 1.7–7.7)
Neutrophils Relative %: 66 %
PLATELETS: 212 10*3/uL (ref 150–400)
RBC: 4.83 MIL/uL (ref 4.22–5.81)
RDW: 13.4 % (ref 11.5–15.5)
WBC: 9.2 10*3/uL (ref 4.0–10.5)

## 2016-03-14 LAB — CBC
HEMATOCRIT: 50.1 % (ref 39.0–52.0)
Hemoglobin: 17.3 g/dL — ABNORMAL HIGH (ref 13.0–17.0)
MCH: 32.4 pg (ref 26.0–34.0)
MCHC: 34.5 g/dL (ref 30.0–36.0)
MCV: 93.8 fL (ref 78.0–100.0)
PLATELETS: 267 10*3/uL (ref 150–400)
RBC: 5.34 MIL/uL (ref 4.22–5.81)
RDW: 13.1 % (ref 11.5–15.5)
WBC: 10.1 10*3/uL (ref 4.0–10.5)

## 2016-03-14 LAB — BASIC METABOLIC PANEL
Anion gap: 11 (ref 5–15)
BUN: 7 mg/dL (ref 6–20)
CHLORIDE: 102 mmol/L (ref 101–111)
CO2: 27 mmol/L (ref 22–32)
CREATININE: 1.07 mg/dL (ref 0.61–1.24)
Calcium: 8.9 mg/dL (ref 8.9–10.3)
Glucose, Bld: 96 mg/dL (ref 65–99)
POTASSIUM: 4 mmol/L (ref 3.5–5.1)
SODIUM: 140 mmol/L (ref 135–145)

## 2016-03-14 LAB — PROTIME-INR
INR: 0.87
INR: 0.99
PROTHROMBIN TIME: 11.8 s (ref 11.4–15.2)
Prothrombin Time: 13.1 seconds (ref 11.4–15.2)

## 2016-03-14 LAB — COMPREHENSIVE METABOLIC PANEL
ALK PHOS: 97 U/L (ref 38–126)
ALT: 17 U/L (ref 17–63)
AST: 23 U/L (ref 15–41)
Albumin: 3.2 g/dL — ABNORMAL LOW (ref 3.5–5.0)
Anion gap: 6 (ref 5–15)
BILIRUBIN TOTAL: 0.2 mg/dL — AB (ref 0.3–1.2)
BUN: 6 mg/dL (ref 6–20)
CALCIUM: 8.2 mg/dL — AB (ref 8.9–10.3)
CHLORIDE: 106 mmol/L (ref 101–111)
CO2: 24 mmol/L (ref 22–32)
CREATININE: 1 mg/dL (ref 0.61–1.24)
GFR calc Af Amer: >60 mL/min (ref >60)
Glucose, Bld: 111 mg/dL — ABNORMAL HIGH (ref 65–99)
Potassium: 4.3 mmol/L (ref 3.5–5.1)
Sodium: 136 mmol/L (ref 135–145)
Total Protein: 5.9 g/dL — ABNORMAL LOW (ref 6.5–8.1)

## 2016-03-14 LAB — HEPARIN LEVEL (UNFRACTIONATED): HEPARIN UNFRACTIONATED: 0.44 [IU]/mL (ref 0.30–0.70)

## 2016-03-14 LAB — BRAIN NATRIURETIC PEPTIDE: B Natriuretic Peptide: 27.9 pg/mL (ref 0.0–100.0)

## 2016-03-14 LAB — TROPONIN I
TROPONIN I: 0.08 ng/mL — AB (ref <0.03)
TROPONIN I: 0.12 ng/mL — AB (ref <0.03)
Troponin I: 0.11 ng/mL (ref <0.03)
Troponin I: 0.11 ng/mL (ref <0.03)

## 2016-03-14 LAB — I-STAT TROPONIN, ED: Troponin i, poc: 0.09 ng/mL (ref 0.00–0.08)

## 2016-03-14 LAB — TSH: TSH: 4.546 u[IU]/mL — ABNORMAL HIGH (ref 0.350–4.500)

## 2016-03-14 LAB — MAGNESIUM: MAGNESIUM: 2 mg/dL (ref 1.7–2.4)

## 2016-03-14 LAB — APTT: APTT: 144 s — AB (ref 24–36)

## 2016-03-14 SURGERY — LEFT HEART CATH AND CORONARY ANGIOGRAPHY

## 2016-03-14 MED ORDER — ASPIRIN 81 MG PO CHEW: 324.0000 mg | CHEWABLE_TABLET | ORAL | Status: DC

## 2016-03-14 MED ORDER — SODIUM CHLORIDE 0.9% FLUSH: 3.0000 mL | INTRAVENOUS | Status: DC | PRN

## 2016-03-14 MED ORDER — ASPIRIN 300 MG RE SUPP: 300.0000 mg | RECTAL | Status: DC

## 2016-03-14 MED ORDER — METHYLPREDNISOLONE SODIUM SUCC 125 MG IJ SOLR: 125.0000 mg | INTRAMUSCULAR | Status: DC

## 2016-03-14 MED ORDER — HEPARIN (PORCINE) IN NACL 100-0.45 UNIT/ML-% IJ SOLN: 12.0000 [IU]/kg/h | INTRAMUSCULAR | Status: DC

## 2016-03-14 MED ORDER — HEPARIN SODIUM (PORCINE) 1000 UNIT/ML IJ SOLN
INTRAMUSCULAR | Status: DC | PRN
Start: 2016-03-14 — End: 2016-03-14
  Administered 2016-03-14: 4000 [IU] via INTRAVENOUS

## 2016-03-14 MED ORDER — NITROGLYCERIN 0.4 MG SL SUBL: 0.4000 mg | SUBLINGUAL_TABLET | SUBLINGUAL | Status: DC | PRN

## 2016-03-14 MED ORDER — MORPHINE SULFATE (PF) 4 MG/ML IV SOLN
2.0000 mg | Freq: Once | INTRAVENOUS | Status: DC
  Filled 2016-03-14: qty 1

## 2016-03-14 MED ORDER — NICOTINE 14 MG/24HR TD PT24
14.0000 mg | MEDICATED_PATCH | Freq: Every day | TRANSDERMAL | Status: DC
  Administered 2016-03-14 – 2016-03-18 (×5): 14 mg via TRANSDERMAL
  Filled 2016-03-14 (×5): qty 1

## 2016-03-14 MED ORDER — HEPARIN (PORCINE) IN NACL 2-0.9 UNIT/ML-% IJ SOLN
INTRAMUSCULAR | Status: DC | PRN
  Administered 2016-03-14: 1000 mL

## 2016-03-14 MED ORDER — SODIUM CHLORIDE 0.9 % WEIGHT BASED INFUSION
3.0000 mL/kg/h | INTRAVENOUS | Status: DC
  Administered 2016-03-14: 3 mL/kg/h via INTRAVENOUS

## 2016-03-14 MED ORDER — ASPIRIN EC 81 MG PO TBEC
81.0000 mg | DELAYED_RELEASE_TABLET | Freq: Every day | ORAL | Status: DC
  Administered 2016-03-15 – 2016-03-18 (×4): 81 mg via ORAL
  Filled 2016-03-14 (×4): qty 1

## 2016-03-14 MED ORDER — VERAPAMIL HCL 2.5 MG/ML IV SOLN
INTRAVENOUS | Status: AC
  Filled 2016-03-14: qty 2

## 2016-03-14 MED ORDER — SODIUM CHLORIDE 0.9 % WEIGHT BASED INFUSION
1.0000 mL/kg/h | INTRAVENOUS | Status: DC
  Administered 2016-03-14: 1 mL/kg/h via INTRAVENOUS

## 2016-03-14 MED ORDER — HEPARIN SODIUM (PORCINE) 5000 UNIT/ML IJ SOLN: 60.0000 [IU]/kg | Freq: Once | INTRAMUSCULAR | Status: DC

## 2016-03-14 MED ORDER — HEPARIN SODIUM (PORCINE) 1000 UNIT/ML IJ SOLN
INTRAMUSCULAR | Status: AC
  Filled 2016-03-14: qty 1

## 2016-03-14 MED ORDER — IOPAMIDOL (ISOVUE-370) INJECTION 76%
INTRAVENOUS | Status: AC
  Filled 2016-03-14: qty 100

## 2016-03-14 MED ORDER — ASPIRIN 81 MG PO CHEW
324.0000 mg | CHEWABLE_TABLET | Freq: Once | ORAL | Status: AC
  Administered 2016-03-14: 324 mg via ORAL
  Filled 2016-03-14: qty 4

## 2016-03-14 MED ORDER — HEPARIN BOLUS VIA INFUSION
4000.0000 [IU] | Freq: Once | INTRAVENOUS | Status: AC
  Administered 2016-03-14: 4000 [IU] via INTRAVENOUS
  Filled 2016-03-14: qty 4000

## 2016-03-14 MED ORDER — MORPHINE SULFATE (PF) 4 MG/ML IV SOLN
4.0000 mg | Freq: Once | INTRAVENOUS | Status: AC
  Administered 2016-03-14: 4 mg via INTRAVENOUS
  Filled 2016-03-14: qty 1

## 2016-03-14 MED ORDER — HEPARIN (PORCINE) IN NACL 100-0.45 UNIT/ML-% IJ SOLN
850.0000 [IU]/h | INTRAMUSCULAR | Status: DC
  Administered 2016-03-14: 850 [IU]/h via INTRAVENOUS
  Filled 2016-03-14: qty 250

## 2016-03-14 MED ORDER — MORPHINE SULFATE (PF) 4 MG/ML IV SOLN
2.0000 mg | Freq: Once | INTRAVENOUS | Status: AC
  Administered 2016-03-14: 2 mg via INTRAVENOUS

## 2016-03-14 MED ORDER — SODIUM CHLORIDE 0.9% FLUSH
3.0000 mL | Freq: Two times a day (BID) | INTRAVENOUS | Status: DC
Start: 2016-03-14 — End: 2016-03-14

## 2016-03-14 MED ORDER — FENTANYL CITRATE (PF) 100 MCG/2ML IJ SOLN
INTRAMUSCULAR | Status: AC
  Filled 2016-03-14: qty 2

## 2016-03-14 MED ORDER — SODIUM CHLORIDE 0.9 % IV SOLN: 250.0000 mL | INTRAVENOUS | Status: DC | PRN

## 2016-03-14 MED ORDER — DIPHENHYDRAMINE HCL 50 MG/ML IJ SOLN
25.0000 mg | INTRAMUSCULAR | Status: AC
  Administered 2016-03-14: 25 mg via INTRAVENOUS
  Filled 2016-03-14: qty 1

## 2016-03-14 MED ORDER — SODIUM CHLORIDE 0.9% FLUSH
3.0000 mL | Freq: Two times a day (BID) | INTRAVENOUS | Status: DC
  Administered 2016-03-14 – 2016-03-18 (×7): 3 mL via INTRAVENOUS

## 2016-03-14 MED ORDER — METHYLPREDNISOLONE SODIUM SUCC 125 MG IJ SOLR
125.0000 mg | INTRAMUSCULAR | Status: AC
  Administered 2016-03-14: 125 mg via INTRAVENOUS
  Filled 2016-03-14: qty 2

## 2016-03-14 MED ORDER — IOPAMIDOL (ISOVUE-370) INJECTION 76%
INTRAVENOUS | Status: DC | PRN
  Administered 2016-03-14: 100 mL via INTRA_ARTERIAL

## 2016-03-14 MED ORDER — MIDAZOLAM HCL 2 MG/2ML IJ SOLN
INTRAMUSCULAR | Status: AC
  Filled 2016-03-14: qty 2

## 2016-03-14 MED ORDER — LIDOCAINE HCL (PF) 1 % IJ SOLN
INTRAMUSCULAR | Status: AC
  Filled 2016-03-14: qty 30

## 2016-03-14 MED ORDER — LIDOCAINE HCL (PF) 1 % IJ SOLN
INTRAMUSCULAR | Status: DC | PRN
  Administered 2016-03-14: 2 mL

## 2016-03-14 MED ORDER — NITROGLYCERIN IN D5W 200-5 MCG/ML-% IV SOLN
2.0000 ug/min | INTRAVENOUS | Status: DC
  Administered 2016-03-14: 5 ug/min via INTRAVENOUS
  Administered 2016-03-15 – 2016-03-18 (×5): 45 ug/min via INTRAVENOUS
  Filled 2016-03-14 (×7): qty 250

## 2016-03-14 MED ORDER — HEPARIN (PORCINE) IN NACL 2-0.9 UNIT/ML-% IJ SOLN
INTRAMUSCULAR | Status: AC
  Filled 2016-03-14: qty 1000

## 2016-03-14 MED ORDER — SODIUM CHLORIDE 0.9 % IV SOLN
250.0000 mL | INTRAVENOUS | Status: DC | PRN
Start: 2016-03-14 — End: 2016-03-19
  Administered 2016-03-14: 250 mL via INTRAVENOUS

## 2016-03-14 MED ORDER — FAMOTIDINE 20 MG PO TABS: 20.0000 mg | ORAL_TABLET | ORAL | Status: DC

## 2016-03-14 MED ORDER — MORPHINE SULFATE (PF) 2 MG/ML IV SOLN
2.0000 mg | INTRAVENOUS | Status: DC | PRN
  Administered 2016-03-14 – 2016-03-18 (×14): 2 mg via INTRAVENOUS
  Filled 2016-03-14 (×14): qty 1

## 2016-03-14 MED ORDER — MIDAZOLAM HCL 2 MG/2ML IJ SOLN
INTRAMUSCULAR | Status: DC | PRN
  Administered 2016-03-14: 2 mg via INTRAVENOUS

## 2016-03-14 MED ORDER — ONDANSETRON HCL 4 MG/2ML IJ SOLN
4.0000 mg | Freq: Four times a day (QID) | INTRAMUSCULAR | Status: DC | PRN
  Administered 2016-03-18: 4 mg via INTRAVENOUS
  Filled 2016-03-14: qty 2

## 2016-03-14 MED ORDER — DIPHENHYDRAMINE HCL 50 MG/ML IJ SOLN
25.0000 mg | INTRAMUSCULAR | Status: DC
Start: 2016-03-15 — End: 2016-03-14

## 2016-03-14 MED ORDER — FAMOTIDINE 20 MG PO TABS
20.0000 mg | ORAL_TABLET | ORAL | Status: AC
  Administered 2016-03-14: 20 mg via ORAL
  Filled 2016-03-14: qty 1

## 2016-03-14 MED ORDER — ACETAMINOPHEN 325 MG PO TABS
650.0000 mg | ORAL_TABLET | ORAL | Status: DC | PRN
  Administered 2016-03-14: 650 mg via ORAL
  Filled 2016-03-14: qty 2

## 2016-03-14 MED ORDER — ASPIRIN 81 MG PO CHEW
81.0000 mg | CHEWABLE_TABLET | ORAL | Status: DC
Start: 2016-03-15 — End: 2016-03-14

## 2016-03-14 MED ORDER — FENTANYL CITRATE (PF) 100 MCG/2ML IJ SOLN
INTRAMUSCULAR | Status: DC | PRN
  Administered 2016-03-14 (×2): 50 ug via INTRAVENOUS

## 2016-03-14 MED ORDER — NITROGLYCERIN 0.4 MG SL SUBL
0.4000 mg | SUBLINGUAL_TABLET | SUBLINGUAL | Status: DC | PRN
  Administered 2016-03-14 (×2): 0.4 mg via SUBLINGUAL
  Filled 2016-03-14: qty 1

## 2016-03-14 MED ORDER — VERAPAMIL HCL 2.5 MG/ML IV SOLN
INTRAVENOUS | Status: DC | PRN
  Administered 2016-03-14: 10 mL via INTRA_ARTERIAL

## 2016-03-14 MED ORDER — SODIUM CHLORIDE 0.9 % IV SOLN
INTRAVENOUS | Status: AC
  Administered 2016-03-14: 18:00:00 via INTRAVENOUS

## 2016-03-14 MED ORDER — ATORVASTATIN CALCIUM 80 MG PO TABS
80.0000 mg | ORAL_TABLET | Freq: Every day | ORAL | Status: DC
  Administered 2016-03-14 – 2016-03-26 (×12): 80 mg via ORAL
  Filled 2016-03-14 (×13): qty 1

## 2016-03-14 MED ORDER — METOPROLOL TARTRATE 25 MG PO TABS
25.0000 mg | ORAL_TABLET | Freq: Two times a day (BID) | ORAL | Status: DC
  Administered 2016-03-14 – 2016-03-18 (×10): 25 mg via ORAL
  Filled 2016-03-14 (×10): qty 1

## 2016-03-14 MED ORDER — HEPARIN (PORCINE) IN NACL 100-0.45 UNIT/ML-% IJ SOLN
850.0000 [IU]/h | INTRAMUSCULAR | Status: DC
  Administered 2016-03-15: 850 [IU]/h via INTRAVENOUS
  Filled 2016-03-14: qty 250

## 2016-03-14 MED ORDER — OXYCODONE-ACETAMINOPHEN 5-325 MG PO TABS
1.0000 | ORAL_TABLET | ORAL | Status: DC | PRN
  Administered 2016-03-14 – 2016-03-19 (×16): 2 via ORAL
  Filled 2016-03-14 (×16): qty 2

## 2016-03-14 SURGICAL SUPPLY — 12 items
CATH INFINITI 5 FR JL3.5 (CATHETERS) ×2 IMPLANT
CATH INFINITI 5FR ANG PIGTAIL (CATHETERS) ×2 IMPLANT
CATH INFINITI JR4 5F (CATHETERS) ×2 IMPLANT
DEVICE RAD COMP TR BAND LRG (VASCULAR PRODUCTS) ×2 IMPLANT
GLIDESHEATH SLEND SS 6F .021 (SHEATH) ×2 IMPLANT
GUIDEWIRE INQWIRE 1.5J.035X260 (WIRE) IMPLANT
INQWIRE 1.5J .035X260CM (WIRE) ×3
KIT HEART LEFT (KITS) ×3 IMPLANT
PACK CARDIAC CATHETERIZATION (CUSTOM PROCEDURE TRAY) ×3 IMPLANT
SYR MEDRAD MARK V 150ML (SYRINGE) ×3 IMPLANT
TRANSDUCER W/STOPCOCK (MISCELLANEOUS) ×3 IMPLANT
TUBING CIL FLEX 10 FLL-RA (TUBING) ×3 IMPLANT

## 2016-03-14 NOTE — Interval H&P Note (Signed)
History and Physical Interval Note:  03/14/2016 3:17 PM  Douglas EndsJonathan [name]  has presented today for cardiac cath with the diagnosis of nstemi.  The various methods of treatment have been discussed with the patient and family. After consideration of risks, benefits and other options for treatment, the patient has consented to  Procedure(s): Left Heart Cath and Coronary Angiography (N/A) as a surgical intervention .  The patient's history has been reviewed, patient examined, no change in status, stable for surgery.  I have reviewed the patient's chart and labs.  Questions were answered to the patient's satisfaction.    Cath Lab Visit (complete for each Cath Lab visit)  Clinical Evaluation Leading to the Procedure:   ACS: Yes.    Non-ACS:    Anginal Classification: CCS III  Anti-ischemic medical therapy: No Therapy  Non-Invasive Test Results: No non-invasive testing performed  Prior CABG: No previous CABG         Verne Carrowhristopher Eyonna Sandstrom

## 2016-03-14 NOTE — ED Notes (Signed)
Dr Rhunette CroftNanavati aware of Trop 0.09.  Placed order for regular Troponin and try to find a bed for patient

## 2016-03-14 NOTE — Progress Notes (Signed)
ANTICOAGULATION CONSULT NOTE  Pharmacy Consult for heparin Indication: multivessel CAD  Allergies  Allergen Reactions  . Ibuprofen Other (See Comments)    Upset stomach  . Shellfish Allergy Nausea And Vomiting    Patient Measurements: Height: 5\' 11"  (180.3 cm) Weight:  (scale C) IBW/kg (Calculated) : 75.3  Vital Signs: Temp: 97.9 F (36.6 C) (01/03 1609) Temp Source: Oral (01/03 1609) BP: 111/81 (01/03 1609) Pulse Rate: 82 (01/03 1609)  Labs:  Recent Labs  03/14/16 0107 03/14/16 0400 03/14/16 0630 03/14/16 1015  HGB 17.3*  --  15.7  --   HCT 50.1  --  45.8  --   PLT 267  --  212  --   APTT  --   --  144*  --   LABPROT  --  11.8 13.1  --   INR  --  0.87 0.99  --   HEPARINUNFRC  --   --   --  0.44  CREATININE 1.07  --  1.00  --   TROPONINI 0.08* 0.12* 0.11* 0.11*    Estimated Creatinine Clearance: 91.8 mL/min (by C-G formula based on SCr of 1 mg/dL).  Assessment: 51yo male c/o CP associated w/ SOB, N/V, and dry cough since last pm, troponin elevated, started on IV heparin. Noted history of medical noncompliance.   He is s/p cath with multivessel CAD. Pharmacy consulted to resume IV heparin 8 hrs post sheath removal. TR band placed ~15:53. TCTS being consulted for possible CABG. No bleeding noted.  Goal of Therapy:  Heparin level 0.3-0.7 units/ml Monitor platelets by anticoagulation protocol: Yes   Plan:  Resume heparin drip at 850 units/hr at midnight with no bolus 6 hr heparin level Daily heparin level and CBC Monitor for s/sx of bleeding   Loura BackJennifer , PharmD, BCPS Clinical Pharmacist Phone for tonight [phone]- [phone] Main pharmacy - [phone] 03/14/2016 4:32 PM

## 2016-03-14 NOTE — ED Notes (Signed)
Called Cath Lab and they stated pt is on list for this afternoon, but no specific time.

## 2016-03-14 NOTE — ED Notes (Signed)
Pt wanted something to drink, I checked with dr and he said no. Told pt that we had to hold off for a little longer.

## 2016-03-14 NOTE — Progress Notes (Signed)
Site began to bleed with attempt to remove 2cc

## 2016-03-14 NOTE — H&P (Signed)
Admit date: 03/14/2016 Referring Physician: Dr. Rhunette CroftNanavati Primary Cardiologist:  Dr. Donnie Ahoilley Chief complaint/reason for admission:Chest pain  HPI: This is a 50yo WM with a history of CAD with prior cath 2015 with 50% pLAD, 60% ostial D1, 90% RCA s/p PCI RCA with DES 01/2014.  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  He was initially on Brilinta and ASA as well as statin but never followed up and stopped all of his heart meds at least a year ago.  He has been having chest pain that he says is constant for the past 4 days but says that it waxes and wanes.  It is much worse with exertion but is also nonexertional.  He has had DOE as well.  Last night he got very nauseated and vomited.  Today the pain got much worse with radiation to his arms.  He says that the pain is very similar to his prior cardiac pain.  He has not stopped smoking since his cath and smokes 1ppd.  He came to the ER due to worsening of his symptoms.  His initial POC trop in ER was 0.09 and repeat trop 0.12.  Cardiology is now asked to consult for further treatment.  He currently complains of 3/10 CP.   PMH:    Past Medical History:  Diagnosis Date  . Anginal pain (HCC)   . Coronary artery disease   . GERD (gastroesophageal reflux disease)   . NSTEMI (non-ST elevated myocardial infarction) (HCC) 01/2014  . Pancreatitis   . PTSD (post-traumatic stress disorder)   . Tobacco abuse     PSH:    Past Surgical History:  Procedure Laterality Date  . CORONARY ANGIOPLASTY WITH STENT PLACEMENT  01/18/2014   "1"  . LEFT HEART CATHETERIZATION WITH CORONARY ANGIOGRAM N/A 01/18/2014   Procedure: LEFT HEART CATHETERIZATION WITH CORONARY ANGIOGRAM;  Surgeon: Runell GessJonathan J Berry, MD;  [facility];  Service: Cardiovascular;  Laterality: N/A;    ALLERGIES:   Ibuprofen and Shellfish allergy  Prior to Admit Meds:   (Not in a hospital admission) Family HX:    Family History    Problem Relation Age of Onset  . Cancer Mother     Lung cancer  . Coronary artery disease Mother   . Heart failure Maternal Grandmother    Social HX:    Social History   Social History  . Marital status: Divorced    Spouse name: N/A  . Number of children: 2  . Years of education: N/A   Occupational History  . [employer]    Social History Main Topics  . Smoking status: Current Every Day Smoker    Packs/day: 1.00    Years: 31.00    Types: Cigarettes  . Smokeless tobacco: Never Used  . Alcohol use 0.6 oz/week    1 Cans of beer per week  . Drug use: No     Comment: denies 06/17/2015  . Sexual activity: Not Currently   Other Topics Concern  . Not on file   Social History Narrative   Divorced.  2 sons.  Works at a [employer] in the [employer].  Formerly waited tables.     ROS:  All 11 ROS were addressed and are negative except what is stated in the HPI  PHYSICAL EXAM Vitals:   03/14/16 0430 03/14/16 0500  BP: 109/68 105/68  Pulse: 90 91  Resp: 16 16  Temp:     General:  Well developed, well nourished, in no acute distress Head: Eyes PERRLA, No xanthomas.   Normal cephalic and atramatic  Lungs:   Clear bilaterally to auscultation and percussion. Heart:   HRRR S1 S2 Pulses are 2+ & equal.            No carotid bruit. No JVD.  No abdominal bruits. No femoral bruits. Abdomen: Bowel sounds are positive, abdomen soft and non-tender without masses  Msk:  Back normal, normal gait. Normal strength and tone for age. Extremities:   No clubbing, cyanosis or edema.  DP +1 Neuro: Alert and oriented X 3. Psych:  Good affect, responds appropriately   Labs:   Lab Results  Component Value Date   WBC 10.1 03/14/2016   HGB 17.3 (H) 03/14/2016   HCT 50.1 03/14/2016   MCV 93.8 03/14/2016   PLT 267 03/14/2016    Recent Labs Lab 03/14/16 0107  NA 140  K 4.0  CL 102  CO2 27  BUN 7  CREATININE 1.07  CALCIUM 8.9  GLUCOSE 96   Lab Results  Component Value Date    TROPONINI 0.12 (HH) 03/14/2016   No results found for: PTT Lab Results  Component Value Date   INR 0.87 03/14/2016   INR 0.98 01/18/2014   INR 0.89 10/30/2009     Lab Results  Component Value Date   CHOL 121 04/08/2015   CHOL 154 01/18/2014   Lab Results  Component Value Date   HDL 35 (L) 04/08/2015   HDL 45 01/18/2014   Lab Results  Component Value Date   LDLCALC 71 04/08/2015   LDLCALC 94 01/18/2014   Lab Results  Component Value Date   TRIG 75 04/08/2015   TRIG 73 01/18/2014   Lab Results  Component Value Date   CHOLHDL 3.5 04/08/2015   CHOLHDL 3.4 01/18/2014   No results found for: LDLDIRECT    Radiology:  Dg Chest 2 View  Result Date: 03/14/2016 CLINICAL DATA:  Central chest pain with dyspnea, onset last night. EXAM: CHEST  2 VIEW COMPARISON:  08/05/2015 FINDINGS: Moderate hyperinflation. Scattered calcified granulomatous changes. The lungs are otherwise clear. The pulmonary vasculature is normal. There is no pleural effusion. Hilar and mediastinal contours are unremarkable and unchanged. Heart size is normal. IMPRESSION: Hyperinflation. Stable granulomatous changes. No consolidation or effusion. Electronically Signed   By: Ellery Plunk M.D.   On: 03/14/2016 01:45    EKG:  NSR with mild peaking of the T waves in the inferior leads.  ASSESSMENT/PLAN:   1.  NSTEMI - he has had CP for 4 days worsening with exertion and associated with N/V and SOB.  EKG shows mildly peaked T waves.  Currently complains of 3/10 CP. He has been off all his cardiac meds since at least a year and continues to smoke.  Will start IV Heparin gtt and IV NTG gtt.  Continue ASA.  Start high dose statin and check FLP.  Hold on BB due to soft BP.  Keep NPO for cath today.  Check 2D echo to assess LVF.  2.  ASCAD with remote cath 01/2014 with DES to RCA and residual 50% LAD and 60% D1.  Restart ASA/statin.  3.  GERD  4.  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   Polysubstance abuse with ETOH and tobacco.  Nursing for smoking cessation  Armanda Magic, MD  03/14/2016  5:30 AM

## 2016-03-14 NOTE — ED Provider Notes (Signed)
[facility]-EMERGENCY DEPT Provider Note   CSN: [id number] Arrival date & time: 03/14/16  0049  By signing my name below, I, Vista Mink, attest that this documentation has been prepared under the direction and in the presence of Othella Boyer, MD. Electronically signed, Vista Mink, ED Scribe. 03/14/16. 4:38 AM.   History   Chief Complaint Chief Complaint  Patient presents with  . Chest Pain   HPI HPI Comments: Douglas [name] is a 51 y.o. male, chronic pancreatitis, who presents to the Emergency Department complaining of worsening, central chest pain with associated shortness of breath that started four days ago. Pt states that his pain worsened today and started to radiate to both of his arms. He had one episode of vomiting this evening since arriving to the ED.  He states that this is a similar pain to when he had a stent placed here at [facility] in 2016. He has been taking Aspirin qd since then. His pain is currently an 8/10 in severity. Pt does note that he has not stopped smoking since the procedure in 2016, he is currently smoking 1ppd. He also notes that he has chronic recurrent pancreatitis from [facility] abuse. He is currently drinking approximately one beer every two days. He has not had withdraws from alcohol recently. No diaphoresis.  The history is provided by the patient. No language interpreter was used.    Past Medical History:  Diagnosis Date  . Anginal pain (HCC)   . Coronary artery disease   . GERD (gastroesophageal reflux disease)   . NSTEMI (non-ST elevated myocardial infarction) (HCC) 01/2014  . Pancreatitis   . PTSD (post-traumatic stress disorder)   . Tobacco abuse     Patient Active Problem List   Diagnosis Date Noted  . Alcoholic pancreatitis 04/18/2015  . Pancreatic divisum 04/18/2015  . Abdominal pain, acute   . Chest pain 04/16/2015  . RUQ pain 04/16/2015  . Smoker   . Acute pancreatitis 04/06/2015  . Rectal bleeding 04/06/2015  . Hypocalcemia  04/06/2015  . Pancreatitis, acute 03/09/2015  . Alcohol abuse 03/09/2015  . Fatty liver 03/09/2015  . Generalized abdominal pain   . Abdominal pain 03/05/2015  . Pancreatic mass 03/05/2015  . Unstable angina pectoris (HCC) 01/18/2014  . Unstable angina (HCC) 01/18/2014  . NSTEMI (non-ST elevated myocardial infarction) (HCC) 01/18/2014  . Tobacco abuse     Past Surgical History:  Procedure Laterality Date  . CORONARY ANGIOPLASTY WITH STENT PLACEMENT  01/18/2014   "1"  . LEFT HEART CATHETERIZATION WITH CORONARY ANGIOGRAM N/A 01/18/2014   Procedure: LEFT HEART CATHETERIZATION WITH CORONARY ANGIOGRAM;  Surgeon: Runell Gess, MD;  Location: [facility] CATH LAB;  Service: Cardiovascular;  Laterality: N/A;     Home Medications    Prior to Admission medications   Medication Sig Start Date End Date Taking? Authorizing Provider  Aspirin-Salicylamide-Caffeine (ARTHRITIS STRENGTH BC POWDER PO) Take 1 packet by mouth as needed (for pain).   Yes Historical Provider, MD  diphenhydrAMINE (BENADRYL) 25 mg capsule Take 50 mg by mouth every 6 (six) hours as needed for allergies.   Yes Historical Provider, MD   Family History Family History  Problem Relation Age of Onset  . Cancer Mother     Lung cancer  . Coronary artery disease Mother   . Heart failure Maternal Grandmother     Social History Social History  Substance Use Topics  . Smoking status: Current Every Day Smoker    Packs/day: 1.00    Years: 31.00  Types: Cigarettes  . Smokeless tobacco: Never Used  . Alcohol use 0.6 oz/week    1 Cans of beer per week    Allergies   Ibuprofen and Shellfish allergy   Review of Systems Review of Systems A complete 10 system review of systems was obtained and all systems are negative except as noted in the HPI and PMH.    Physical Exam Updated Vital Signs BP 126/94   Pulse 95   Temp 97.7 F (36.5 C) (Oral)   Resp 12   Ht 5\' 11"  (1.803 m)   Wt 160 lb 0.9 oz (72.6 kg)   SpO2 99%    BMI 22.32 kg/m   Physical Exam  Constitutional: He is oriented to person, place, and time. He appears well-developed and well-nourished. No distress.  HENT:  Head: Normocephalic and atraumatic.  Neck: Normal range of motion.  Cardiovascular: Normal rate and regular rhythm.   Pulmonary/Chest: Effort normal and breath sounds normal.  Neurological: He is alert and oriented to person, place, and time.  Skin: Skin is warm and dry. He is not diaphoretic.  Psychiatric: He has a normal mood and affect. Judgment normal.  Nursing note and vitals reviewed.    ED Treatments / Results  DIAGNOSTIC STUDIES: Oxygen Saturation is 98% on RA, normal by my interpretation.  COORDINATION OF CARE: 3:33 AM-Discussed treatment plan with pt at bedside and pt agreed to plan.   Labs (all labs ordered are listed, but only abnormal results are displayed) Labs Reviewed  CBC - Abnormal; Notable for the following:       Result Value   Hemoglobin 17.3 (*)    All other components within normal limits  TROPONIN I - Abnormal; Notable for the following:    Troponin I 0.08 (*)    All other components within normal limits  TROPONIN I - Abnormal; Notable for the following:    Troponin I 0.12 (*)    All other components within normal limits  I-STAT TROPOININ, ED - Abnormal; Notable for the following:    Troponin i, poc 0.09 (*)    All other components within normal limits  BASIC METABOLIC PANEL  PROTIME-INR  TROPONIN I  TROPONIN I  HEPARIN LEVEL (UNFRACTIONATED)    EKG  EKG Interpretation  Date/Time:  Wednesday March 14 2016 00:55:23 EST Ventricular Rate:  101 PR Interval:  146 QRS Duration: 76 QT Interval:  360 QTC Calculation: 466 R Axis:   74 Text Interpretation:  Sinus tachycardia Cannot rule out Anterior infarct , age undetermined Abnormal ECG peaked t waves in lateral leads Nonspecific ST and T wave abnormality Confirmed by Rhunette Croft, MD, Bryanah Sidell [id number]) on 03/14/2016 3:28:56 AM       EKG  Interpretation  Date/Time:  Wednesday March 14 2016 03:30:21 EST Ventricular Rate:  95 PR Interval:  146 QRS Duration: 81 QT Interval:  373 QTC Calculation: 469 R Axis:   85 Text Interpretation:  Sinus rhythm unchanged ekg Confirmed by Rhunette Croft, MD, Janey Genta [phone]) on 03/14/2016 5:57:08 AM         Radiology Dg Chest 2 View  Result Date: 03/14/2016 CLINICAL DATA:  Central chest pain with dyspnea, onset last night. EXAM: CHEST  2 VIEW COMPARISON:  08/05/2015 FINDINGS: Moderate hyperinflation. Scattered calcified granulomatous changes. The lungs are otherwise clear. The pulmonary vasculature is normal. There is no pleural effusion. Hilar and mediastinal contours are unremarkable and unchanged. Heart size is normal. IMPRESSION: Hyperinflation. Stable granulomatous changes. No consolidation or effusion. Electronically Signed   By: Reuel Boom  Royce Macadamia Mitchell M.D.   On: 03/14/2016 01:45    Procedures Procedures (including critical care time)  CRITICAL CARE Performed by: Derwood KaplanNanavati, Maisee Vollman   Total critical care time: 40 minutes  Critical care time was exclusive of separately billable procedures and treating other patients.  Critical care was necessary to treat or prevent imminent or life-threatening deterioration.  Critical care was time spent personally by me on the following activities: development of treatment plan with patient and/or surrogate as well as nursing, discussions with consultants, evaluation of patient's response to treatment, examination of patient, obtaining history from patient or surrogate, ordering and performing treatments and interventions, ordering and review of laboratory studies, ordering and review of radiographic studies, pulse oximetry and re-evaluation of patient's condition.   Medications Ordered in ED Medications  nitroGLYCERIN (NITROSTAT) SL tablet 0.4 mg (0.4 mg Sublingual Given 03/14/16 0402)  heparin ADULT infusion 100 units/mL (25000 units/28450mL sodium chloride  0.45%) (850 Units/hr Intravenous New Bag/Given 03/14/16 0455)  morphine 4 MG/ML injection 4 mg (4 mg Intravenous Given 03/14/16 0338)  aspirin chewable tablet 324 mg (324 mg Oral Given 03/14/16 0454)  heparin bolus via infusion 4,000 Units (4,000 Units Intravenous Bolus from Bag 03/14/16 0455)     Initial Impression / Assessment and Plan / ED Course  I have reviewed the triage vital signs and the nursing notes.  Pertinent labs & imaging results that were available during my care of the patient were reviewed by me and considered in my medical decision making (see chart for details).  Clinical Course    Pt comes in with cc of chest pain. Hx of CAD. Chest pain is typical and constant. Trop is elevated. Pain resolved with nitro.  Heparin started. Cards to admit.    Final Clinical Impressions(s) / ED Diagnoses   Final diagnoses:  NSTEMI (non-ST elevated myocardial infarction) [facility](HCC)      New Prescriptions New Prescriptions   No medications on file   I personally performed the services described in this documentation, which was scribed in my presence. The recorded information has been reviewed and is accurate.    Derwood KaplanAnkit Laquita Harlan, MD 03/14/16 [phone]

## 2016-03-14 NOTE — Progress Notes (Signed)
3cc removed, began to bleed; 2cc inserted.

## 2016-03-14 NOTE — ED Triage Notes (Signed)
Patient reports central chest pain with SOB , emesis and dry cough onset last night .

## 2016-03-14 NOTE — ED Notes (Signed)
ED Provider at bedside. 

## 2016-03-14 NOTE — Progress Notes (Signed)
ANTICOAGULATION CONSULT NOTE - Initial Consult  Pharmacy Consult for heparin Indication: chest pain/ACS  Allergies  Allergen Reactions  . Ibuprofen Other (See Comments)    Upset stomach  . Shellfish Allergy Nausea And Vomiting    Patient Measurements: Height: 5\' 11"  (180.3 cm) Weight: 160 lb 0.9 oz (72.6 kg) IBW/kg (Calculated) : 75.3  Vital Signs: Temp: 97.7 F (36.5 C) (01/03 0224) Temp Source: Oral (01/03 0224) BP: 126/94 (01/03 0330) Pulse Rate: 95 (01/03 0330)  Labs:  Recent Labs  03/14/16 0107  HGB 17.3*  HCT 50.1  PLT 267  CREATININE 1.07  TROPONINI 0.08*    Estimated Creatinine Clearance: 85.8 mL/min (by C-G formula based on SCr of 1.07 mg/dL).   Medical History: Past Medical History:  Diagnosis Date  . Anginal pain (HCC)   . Coronary artery disease   . GERD (gastroesophageal reflux disease)   . NSTEMI (non-ST elevated myocardial infarction) (HCC) 01/2014  . Pancreatitis   . PTSD (post-traumatic stress disorder)   . Tobacco abuse     Assessment: 51yo male c/o CP associated w/ SOB, N/V, and dry cough since last pm, troponin elevated, to begin heparin.  Goal of Therapy:  Heparin level 0.3-0.7 units/ml Monitor platelets by anticoagulation protocol: Yes   Plan:  Will give heparin 4000 units IV bolus x1 followed by gtt at 850 units/hr and monitor heparin levels and CBC.  Vernard GamblesVeronda Eulonda Andalon, PharmD, BCPS  03/14/2016,3:39 AM

## 2016-03-14 NOTE — Progress Notes (Signed)
First attempt to remove 2-3cc of air from TR Band, produced bleeding from the site.   Awaiting longer period before next attempt. Pulses strong.

## 2016-03-14 NOTE — Progress Notes (Signed)
ANTICOAGULATION CONSULT NOTE  Pharmacy Consult for heparin Indication: chest pain/ACS  Allergies  Allergen Reactions  . Ibuprofen Other (See Comments)    Upset stomach  . Shellfish Allergy Nausea And Vomiting    Patient Measurements: Height: 5\' 11"  (180.3 cm) Weight: 160 lb 0.9 oz (72.6 kg) IBW/kg (Calculated) : 75.3  Vital Signs: Temp: 97.7 F (36.5 C) (01/03 0224) Temp Source: Oral (01/03 0224) BP: 130/85 (01/03 1015) Pulse Rate: 75 (01/03 1015)  Labs:  Recent Labs  03/14/16 0107 03/14/16 0400 03/14/16 0630 03/14/16 1015  HGB 17.3*  --  15.7  --   HCT 50.1  --  45.8  --   PLT 267  --  212  --   APTT  --   --  144*  --   LABPROT  --  11.8 13.1  --   INR  --  0.87 0.99  --   HEPARINUNFRC  --   --   --  0.44  CREATININE 1.07  --  1.00  --   TROPONINI 0.08* 0.12* 0.11*  --     Estimated Creatinine Clearance: 91.8 mL/min (by C-G formula based on SCr of 1 mg/dL).  Assessment: 51yo male c/o CP associated w/ SOB, N/V, and dry cough since last pm, troponin elevated, started on IV heparin. Heparin level is therapeutic at 0.44. H/H + platelets are WNL. No bleeding noted. Planning cardiac cath today.   Goal of Therapy:  Heparin level 0.3-0.7 units/ml Monitor platelets by anticoagulation protocol: Yes   Plan:  Continue heparin gtt 850 units/hr Daily heparin level and CBC  Lysle Pearlachel Hilberto Burzynski, PharmD, BCPS Pager # [phone] 03/14/2016 11:19 AM

## 2016-03-14 NOTE — Progress Notes (Signed)
1830 1cc removal appeared to produce mild leaking of blood. Will maintain vitals assessment, and hold further band reduction until night shift. 1900 or later.

## 2016-03-14 NOTE — Progress Notes (Signed)
Questionable small amount of bleeding, difficult to distinguish. Will wait 30 minutes prior to next withdrawal.

## 2016-03-14 NOTE — Progress Notes (Signed)
After 30 minute wait from first attempt to extract 3 cc of air from TR Band, 3cc removed again; pt began to bleed. 2cc inserted. Bleeding appears to be controlled. Vitals normal.

## 2016-03-15 ENCOUNTER — Other Ambulatory Visit: Payer: Self-pay | Admitting: *Deleted

## 2016-03-15 ENCOUNTER — Encounter (HOSPITAL_COMMUNITY): Payer: Self-pay | Admitting: Cardiovascular Disease

## 2016-03-15 ENCOUNTER — Inpatient Hospital Stay (HOSPITAL_COMMUNITY): Payer: Self-pay

## 2016-03-15 DIAGNOSIS — I2510 Atherosclerotic heart disease of native coronary artery without angina pectoris: Secondary | ICD-10-CM

## 2016-03-15 DIAGNOSIS — I25110 Atherosclerotic heart disease of native coronary artery with unstable angina pectoris: Secondary | ICD-10-CM

## 2016-03-15 LAB — HEMOGLOBIN A1C
HEMOGLOBIN A1C: 5.3 % (ref 4.8–5.6)
MEAN PLASMA GLUCOSE: 105 mg/dL

## 2016-03-15 LAB — BASIC METABOLIC PANEL
ANION GAP: 6 (ref 5–15)
BUN: 13 mg/dL (ref 6–20)
CHLORIDE: 103 mmol/L (ref 101–111)
CO2: 24 mmol/L (ref 22–32)
Calcium: 8.2 mg/dL — ABNORMAL LOW (ref 8.9–10.3)
Creatinine, Ser: 1.01 mg/dL (ref 0.61–1.24)
GFR calc non Af Amer: >60 mL/min (ref >60)
Glucose, Bld: 142 mg/dL — ABNORMAL HIGH (ref 65–99)
Potassium: 4.5 mmol/L (ref 3.5–5.1)
Sodium: 133 mmol/L — ABNORMAL LOW (ref 135–145)

## 2016-03-15 LAB — HEPARIN LEVEL (UNFRACTIONATED)
Heparin Unfractionated: 0.22 IU/mL — ABNORMAL LOW (ref 0.30–0.70)
Heparin Unfractionated: 0.24 IU/mL — ABNORMAL LOW (ref 0.30–0.70)
Heparin Unfractionated: 0.52 IU/mL (ref 0.30–0.70)

## 2016-03-15 LAB — LIPID PANEL
CHOL/HDL RATIO: 3.1 ratio
Cholesterol: 153 mg/dL (ref 0–200)
HDL: 50 mg/dL (ref >40)
LDL CALC: 89 mg/dL (ref 0–99)
TRIGLYCERIDES: 70 mg/dL (ref <150)
VLDL: 14 mg/dL (ref 0–40)

## 2016-03-15 LAB — CBC
HEMATOCRIT: 42.3 % (ref 39.0–52.0)
HEMOGLOBIN: 13.9 g/dL (ref 13.0–17.0)
MCH: 31.6 pg (ref 26.0–34.0)
MCHC: 32.9 g/dL (ref 30.0–36.0)
MCV: 96.1 fL (ref 78.0–100.0)
Platelets: 204 10*3/uL (ref 150–400)
RBC: 4.4 MIL/uL (ref 4.22–5.81)
RDW: 13.2 % (ref 11.5–15.5)
WBC: 15.4 10*3/uL — AB (ref 4.0–10.5)

## 2016-03-15 LAB — ECHOCARDIOGRAM COMPLETE
HEIGHTINCHES: 71 in
WEIGHTICAEL: 2428.8 [oz_av]

## 2016-03-15 MED ORDER — HEPARIN (PORCINE) IN NACL 100-0.45 UNIT/ML-% IJ SOLN
1150.0000 [IU]/h | INTRAMUSCULAR | Status: DC
  Administered 2016-03-15: 1150 [IU]/h via INTRAVENOUS
  Administered 2016-03-15: 1000 [IU]/h via INTRAVENOUS
  Administered 2016-03-16 – 2016-03-18 (×3): 1150 [IU]/h via INTRAVENOUS
  Filled 2016-03-15 (×4): qty 250

## 2016-03-15 MED ORDER — IOPAMIDOL (ISOVUE-300) INJECTION 61%
INTRAVENOUS | Status: AC
  Administered 2016-03-15: 30 mL
  Filled 2016-03-15: qty 30

## 2016-03-15 MED ORDER — IOPAMIDOL (ISOVUE-300) INJECTION 61%
INTRAVENOUS | Status: AC
  Administered 2016-03-15: 100 mL
  Filled 2016-03-15: qty 100

## 2016-03-15 NOTE — Progress Notes (Signed)
ANTICOAGULATION CONSULT NOTE  Pharmacy Consult for heparin Indication: multivessel CAD  Allergies  Allergen Reactions  . Ibuprofen Other (See Comments)    Upset stomach  . Shellfish Allergy Nausea And Vomiting    Patient Measurements: Height: 5\' 11"  (180.3 cm) Weight: 151 lb 12.8 oz (68.9 kg) IBW/kg (Calculated) : 75.3  Vital Signs: Temp: 98.2 F (36.8 C) (01/04 0400) Temp Source: Oral (01/04 0400) BP: 122/85 (01/04 0400) Pulse Rate: 95 (01/04 0400)  Labs:  Recent Labs  03/14/16 0107 03/14/16 0400 03/14/16 0630 03/14/16 1015 03/15/16 0633  HGB 17.3*  --  15.7  --  13.9  HCT 50.1  --  45.8  --  42.3  PLT 267  --  212  --  204  APTT  --   --  144*  --   --   LABPROT  --  11.8 13.1  --   --   INR  --  0.87 0.99  --   --   HEPARINUNFRC  --   --   --  0.44 0.22*  CREATININE 1.07  --  1.00  --  1.01  TROPONINI 0.08* 0.12* 0.11* 0.11*  --     Estimated Creatinine Clearance: 86.2 mL/min (by C-G formula based on SCr of 1.01 mg/dL).  Assessment: 51yo male c/o CP associated w/ SOB, N/V, and dry cough since last pm, troponin elevated, started on IV heparin drip on 03/14/16. Noted history of medical noncompliance.   He is s/p cath on 03/14/16 with multivessel CAD.Heparin IV heparin resumed 8 hrs post sheath removal. TRBand removed last night.   TCTS being consulted for possible CABG. No bleeding noted per RN this AM.   Goal of Therapy:  Heparin level 0.3-0.7 units/ml Monitor platelets by anticoagulation protocol: Yes   Plan:  Increase heparin drip to 1000 units/hr 6 hr heparin level Daily heparin level and CBC Monitor for s/sx of bleeding  Thank you for allowing pharmacy to be part of this patients care team.. Noah Delaineuth Lilian Fuhs, RPh Clinical Pharmacist Pager: [phone] Phone until 22:00 M-F - [phone] Main pharmacy - [phone] 03/15/2016 8:25 AM

## 2016-03-15 NOTE — Care Management Note (Signed)
Case Management Note  Patient Details  Name: Douglas EndsJonathan [name] MRN: [id number] Date of Birth: November 29, 1965  Subjective/Objective:    Admitted with NSTEMI                Action/Plan: Patient lives with a friend, no PCP, no medical insurance; Patient stated " I was scheduled to follow up with a heart doctor but I was stupid and didn't." CM talked to patient at length and he is agreeable to make to make lifestyle changes to live a long healthy life. [facility]; he can be followed by their IT traineroc Worker / eligibility program for further assistance; he works 20 - 30 hrs at [employer]; CM will continue to follow for DCP.  Expected Discharge Date:    TBD              Expected Discharge Plan:  Home/Self Care  In-House Referral:   Financial Counselor  Discharge planning Services  CM Consult    Status of Service:  In process, will continue to follow  Reola MosherChandler, Yarianna Varble L, RN,MHA,BSN [phone] 03/15/2016, 10:44 AM

## 2016-03-15 NOTE — Consult Note (Signed)
[address]       [place] [address]             [phone]        Axl Rodino [facility] Medical Record #[id number] Date of Birth: 08-02-1965  Referring: Donnie Aho Primary Care: No PCP Per Patient  Chief Complaint:    Chief Complaint  Patient presents with  . Chest Pain   History of Present Illness:      Mr. Douglas [name] is a 51 yo white male with known history of CAD.  He is S/P MI in 2015 with PCI to RCA.  The patient was originally placed on Brilinta which he stopped taking about a year ago stating he couldn't afford the medication.  He presented to the ED on 03/14/2016 with a 4 day complaint of chest pain with associated N/V and diaphoresis.  EKG showed mildly peaked T waves and his Troponins were positive.  He was ruled in for NSTEMI, placed on Heparin and NTG drips and admitted.  He underwent cardiac catheterization which showed multivessel CAD with an EF of 65%.  It was felt coronary bypass grafting would be indicated and TCTS consult was obtained.  Currently the patient is chest pain free.  His medical history is notable for alcohol induce pancreatitis.  The patient denies alcohol use currently.  He has a history of nicotine abuse smoking 1 ppd for the past 33 years.  He also admits to history of GERD and PTSD.  He states there have been heart problems in his family, however he is unsure at what ages this occurred and states he does not know if they were heart attacks.  Current Activity/ Functional Status: Patient is independent with mobility/ambulation, transfers, ADL's, IADL's.   Zubrod Score: At the time of surgery this patient's most appropriate activity status/level should be described as: [x]     0    Normal activity, no symptoms []     1    Restricted in physical strenuous activity but ambulatory, able to do out light work []     2    Ambulatory and capable of self care, unable to do work activities, up and about                 more than 50%  Of the time                             []     3    Only limited self care, in bed greater than 50% of waking hours []     4    Completely disabled, no self care, confined to bed or chair []     5    Moribund  Past Medical History:  Diagnosis Date  . Anginal pain (HCC)   . Coronary artery disease   . GERD (gastroesophageal reflux disease)   . History of acute pancreatitis 04/06/2015  . NSTEMI (non-ST elevated myocardial infarction) (HCC) 01/2014  . Pancreatitis   . PTSD (post-traumatic stress disorder)   . Tobacco abuse     Past Surgical History:  Procedure Laterality Date  . CARDIAC CATHETERIZATION N/A 03/14/2016   Procedure: Left Heart Cath and Coronary Angiography;  Surgeon: Kathleene Hazel, MD;  Location: [facility] INVASIVE CV LAB;  Service: Cardiovascular;  Laterality: N/A;  . CORONARY ANGIOPLASTY WITH STENT PLACEMENT  01/18/2014   "1"  . LEFT HEART CATHETERIZATION WITH CORONARY ANGIOGRAM N/A 01/18/2014   Procedure:  LEFT HEART CATHETERIZATION WITH CORONARY ANGIOGRAM;  Surgeon: Runell Gess, MD;  Location: [facility] CATH LAB;  Service: Cardiovascular;  Laterality: N/A;    History  Smoking Status  . Current Every Day Smoker  . Packs/day: 1.00  . Years: 31.00  . Types: Cigarettes  Smokeless Tobacco  . Never Used    History  Alcohol Use  . 0.6 oz/week  . 1 Cans of beer per week    Social History   Social History  . Marital status: Divorced    Spouse name: N/A  . Number of children: 2  . Years of education: N/A   Occupational History  . [employer]    Social History Main Topics  . Smoking status: Current Every Day Smoker    Packs/day: 1.00    Years: 31.00    Types: Cigarettes  . Smokeless tobacco: Never Used  . Alcohol use 0.6 oz/week    1 Cans of beer per week  . Drug use: No     Comment: denies 06/17/2015  . Sexual activity: Not Currently   Other Topics Concern  . Not on file   Social History Narrative   Divorced.  2 sons.  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    Allergies  Allergen Reactions  . Ibuprofen Other (See Comments)    Upset stomach  . Shellfish Allergy Nausea And Vomiting    Current Facility-Administered Medications  Medication Dose Route Frequency Provider Last Rate Last Dose  . 0.9 %  sodium chloride infusion  250 mL Intravenous PRN Kathleene Hazel, MD 10 mL/hr at 03/14/16 2233 250 mL at 03/14/16 2233  . acetaminophen (TYLENOL) tablet 650 mg  650 mg Oral Q4H PRN Quintella Reichert, MD   650 mg at 03/14/16 1027  . aspirin EC tablet 81 mg  81 mg Oral Daily Quintella Reichert, MD   81 mg at 03/15/16 0936  . atorvastatin (LIPITOR) tablet 80 mg  80 mg Oral q1800 Quintella Reichert, MD   80 mg at 03/14/16 2017  . heparin ADULT infusion 100 units/mL (25000 units/23mL sodium chloride 0.45%)  1,000 Units/hr Intravenous Continuous Othella Boyer, MD 10 mL/hr at 03/15/16 0935 1,000 Units/hr at 03/15/16 0935  . metoprolol tartrate (LOPRESSOR) tablet 25 mg  25 mg Oral BID Quintella Reichert, MD   25 mg at 03/15/16 0936  . morphine 2 MG/ML injection 2 mg  2 mg Intravenous Q1H PRN Kathleene Hazel, MD   2 mg at 03/15/16 0410  . morphine 4 MG/ML injection 2 mg  2 mg Intravenous Once Othella Boyer, MD      . nicotine (NICODERM CQ - dosed in mg/24 hours) patch 14 mg  14 mg Transdermal Daily Laurey Morale, MD   14 mg at 03/15/16 0936  . nitroGLYCERIN (NITROSTAT) SL tablet 0.4 mg  0.4 mg Sublingual Q5 min PRN Derwood Kaplan, MD   0.4 mg at 03/14/16 0402  . nitroGLYCERIN (NITROSTAT) SL tablet 0.4 mg  0.4 mg Sublingual Q5 Min x 3 PRN Quintella Reichert, MD      . nitroGLYCERIN 50 mg in dextrose 5 % 250 mL (0.2 mg/mL) infusion  2-200 mcg/min Intravenous Titrated Quintella Reichert, MD 13.5 mL/hr at 03/15/16 0404 45 mcg/min at 03/15/16 0404  . ondansetron (ZOFRAN) injection 4 mg  4 mg Intravenous Q6H PRN Quintella Reichert, MD      . oxyCODONE-acetaminophen (PERCOCET/ROXICET) 5-325 MG per tablet 1-2 tablet  1-2 tablet Oral Q4H PRN Kathleene Hazel, MD   2 tablet at 03/15/16 [phone]  . sodium chloride flush (NS) 0.9 % injection 3 mL  3 mL Intravenous Q12H Kathleene Hazel, MD   3 mL at 03/15/16 0937  . sodium chloride flush (NS) 0.9 % injection 3 mL  3 mL Intravenous PRN Kathleene Hazel, MD        Prescriptions Prior to Admission  Medication Sig Dispense Refill Last Dose  . Aspirin-Salicylamide-Caffeine (ARTHRITIS STRENGTH BC POWDER PO) Take 1 packet by mouth as needed (for pain).   03/13/2016 at Unknown time  . diphenhydrAMINE (BENADRYL) 25 mg capsule Take 50 mg by mouth every 6 (six) hours as needed for allergies.   03/13/2016 at Unknown time    Family History  Problem Relation Age of Onset  . Cancer Mother     Lung cancer  . Coronary artery disease Mother   . Heart failure Maternal Grandmother      Review of Systems:  Constitutional: negative Eyes: negative Respiratory: negative Cardiovascular: positive for chest pressure/discomfort Gastrointestinal: positive for nausea and vomiting Hematologic/lymphatic: negative Neurological: negative     Cardiac Review of Systems: Y or N  Chest Pain [ y   ]  Resting SOB [n   ] Exertional SOB  [ y ]  Orthopnea [  ]   Pedal Edema [  n ]    Palpitations [ n ] Syncope  [n  ]   Presyncope [n   ]  General Review of Systems: [Y] = yes [  ]=no Constitional: recent weight change [n  ]; anorexia [ y ]; fatigue [  ]; nausea [  ]; night sweats [  ]; fever [n  ]; or chills [ n ]                                                               Dental: poor dentition[  ]; Last Dentist visit:   Eye : blurred vision [  ]; diplopia [   ]; vision changes [  ];  Amaurosis fugax[  ]; Resp: cough [  ];  wheezing[  ];  hemoptysis[  ]; shortness of breath[  ]; paroxysmal nocturnal dyspnea[  ]; dyspnea on exertion[  ]; or orthopnea[  ];  GI:  gallstones[  ], vomiting[  ];  dysphagia[  ]; melena[  ];  hematochezia [  ]; heartburn[  ];   Hx of  Colonoscopy[  ]; GU: kidney stones [  ]; hematuria[   ];   dysuria [  ];  nocturia[  ];  history of     obstruction [  ]; urinary frequency [  ]             Skin: rash, swelling[  ];, hair loss[  ];  peripheral edema[  ];  or itching[  ]; Musculosketetal: myalgias[  ];  joint swelling[  ];  joint erythema[  ];  joint pain[  ];  back pain[  ];  Heme/Lymph: bruising[  ];  bleeding[  ];  anemia[  ];  Neuro: TIA[  ];  headaches[  ];  stroke[  ];  vertigo[  ];  seizures[  ];   paresthesias[  ];  difficulty walking[  ];  Psych:depression[  ];  anxiety[  ];  Endocrine: diabetes[ n ];  thyroid dysfunction[ n;  Immunizations: Flu [ n ]; Pneumococcal[ n ];  Other:  Physical Exam: BP 134/79 (BP Location: Left Arm)   Pulse 81   Temp 97.6 F (36.4 C) (Oral)   Resp 20   Ht 5\' 11"  (1.803 m)   Wt 151 lb 12.8 oz (68.9 kg)   SpO2 98%   BMI 21.17 kg/m    General appearance: alert, cooperative and no distress Head: Normocephalic, without obvious abnormality, atraumatic, poor dentition Neck: no adenopathy, no carotid bruit, no JVD, supple, symmetrical, trachea midline and thyroid not enlarged, symmetric, no tenderness/mass/nodules Resp: clear to auscultation bilaterally Cardio: regular rate and rhythm GI: soft, non-tender; bowel sounds normal; no masses,  no organomegaly Extremities: extremities normal, atraumatic, no cyanosis or edema Neurologic: Grossly normal  Diagnostic Studies & Laboratory data:     Recent Radiology Findings:   Dg Chest 2 View  Result Date: 03/14/2016 CLINICAL DATA:  Central chest pain with dyspnea, onset last night. EXAM: CHEST  2 VIEW COMPARISON:  08/05/2015 FINDINGS: Moderate hyperinflation. Scattered calcified granulomatous changes. The lungs are otherwise clear. The pulmonary vasculature is normal. There is no pleural effusion. Hilar and mediastinal contours are unremarkable and unchanged. Heart size is normal. IMPRESSION: Hyperinflation. Stable granulomatous changes. No consolidation or effusion. Electronically Signed   By:  Ellery Plunkaniel R Mitchell M.D.   On: 03/14/2016 01:45    IN April 2016  CLINICAL DATA:  Right-sided abdominal pain.  Emesis.  EXAM: CT ABDOMEN AND PELVIS WITH CONTRAST  TECHNIQUE: Multidetector CT imaging of the abdomen and pelvis was performed using the standard protocol following bolus administration of intravenous contrast.  CONTRAST:  100mL ISOVUE-300 IOPAMIDOL (ISOVUE-300) INJECTION 61%  COMPARISON:  Abdominal ultrasound and MRCP 04/16/2015  FINDINGS: Lower chest: The included lung bases are clear. Coronary artery calcifications versus stents. No pleural effusion.  Liver: No focal lesion.  Diffusely decreased density.  Hepatobiliary: Mild gallbladder distention. There is intrahepatic biliary ductal dilatation. Common bile duct measures 7-8 mm.  Pancreas: Ill-defined hypodensity in the pancreatic head with mild adjacent soft tissue stranding. This is in the region of the previous questioned pancreatic mass. There is adjacent thickening of the fourth portion of the duodenum. No definite distal pancreatic ductal dilatation. No distal pancreatic inflammation. Small peripancreatic lymph nodes.  Spleen: Normal.  Adrenal glands: No nodule.  Kidneys: Mild prominence of the right renal pelvis, proximal ureter abuts the region of retroperitoneal stranding, however no frank hydronephrosis. There is symmetric renal excretion. No significant perinephric stranding.  Stomach/Bowel: Stomach physiologically distended. Wall thickening involving the duodenum. There are no dilated or thickened small bowel loops. Small volume of stool throughout the colon without colonic wall thickening. The appendix is normal.  Vascular/Lymphatic: No retroperitoneal adenopathy. Abdominal aorta is normal in caliber. Moderate atherosclerosis without aneurysm.  Reproductive: Normal sized prostate gland.  Bladder: Mildly distended, no wall thickening.  Other: No free air, free fluid, or  intra-abdominal fluid collection. Tiny fat containing umbilical hernia.  Musculoskeletal: There are no acute or suspicious osseous abnormalities. Unilateral right L5 pars defect without listhesis.  IMPRESSION: Ill-defined hypodensity in the pancreatic head, suspicious for pancreatic malignancy. This corresponds to the abnormality on MRCP from 2 months prior. Mild adjacent soft tissue stranding. Mild intra and extrahepatic biliary ductal dilatation and gallbladder distention. Consider endoscopic ultrasound.   Electronically Signed   By: Rubye OaksMelanie  Ehinger M.D.   On: 06/17/2015 23:15   I have independently reviewed the above radiologic studies.  Recent Lab Findings: Lab Results  Component Value Date   WBC 15.4 (H) 03/15/2016   HGB 13.9 03/15/2016   HCT 42.3 03/15/2016   PLT 204 03/15/2016   GLUCOSE 142 (H) 03/15/2016   CHOL 153 03/15/2016   TRIG 70 03/15/2016   HDL 50 03/15/2016   LDLCALC 89 03/15/2016   ALT 17 03/14/2016   AST 23 03/14/2016   NA 133 (L) 03/15/2016   K 4.5 03/15/2016   CL 103 03/15/2016   CREATININE 1.01 03/15/2016   BUN 13 03/15/2016   CO2 24 03/15/2016   TSH 4.546 (H) 03/14/2016   INR 0.99 03/14/2016   HGBA1C 5.3 03/14/2016   Assessment / Plan:       1. CAD- needs CABG, patient noncompliant with stopping Brilinta over a year ago.  Previous stent to RCA in 2015... Currently chest pain free on Heparin and NTG 2. 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 Will need to get repeat CT scan of chest, abd, and pelvis before considering CABG 3. Nicotine abuse 4. Dispo- patient without chest pain currently, likely for CABG next week. 5 Mild elevation of  TSH  I have discussed with the patient risk and options of CABG. I have recommended to the patient proceeding with  Surgery next week. Prior should do ct to follow up on previous suggestion of pancreatic CA.   I  spent 40 minutes  counseling the patient face to face and 50% or more the  time was spent in counseling and coordination of care. The total time spent in the appointment was 60 minutes.  Delight Ovens MD      [address] [place] [place] [address] Office [phone]   Beeper [phone]

## 2016-03-15 NOTE — Clinical Social Work Note (Signed)
Clinical Social Work Assessment  Patient Details  Name: Douglas [name] MRN: [id number] Date of Birth: January 13, 1966  Date of referral:  03/15/16               Reason for consult:  [employer] sought to share information with:    Permission granted to share information::  No  Name::        Agency::     Relationship::     Contact Information:     Housing/Transportation Living arrangements for the past 2 months:  Single Family Home Source of Information:  Patient, Medical Team Patient Interpreter Needed:  None Criminal Activity/Legal Involvement Pertinent to Current Situation/Hospitalization:  No - Comment as needed Significant Relationships:  Friend Lives with:  Friends Do you feel safe going back to the place where you live?  No Need for family participation in patient care:  Yes (Comment)  Care giving concerns:  Patient in need of resources.   Social Worker assessment / plan:  CSW met with patient. No supports at bedside. CSW introduced role and inquired about housing situation. Patient states he's been staying on a friend's couch but her daughter is moving in and he has to move out. Patient has no other place to go. CSW provided shelter list, food pantries and free meals in [place], and a list of community resources. Patient declined substance abuse resources stating that he does not use drugs. Patient mentioned SNF. CSW explained that he has to be seen by PT first. Patient does not have insurance at this time but per [facility], his insurance through his job at [employer] should begin sometime this month. Patient thinks he may qualify for disability after his bypass. No further concerns. CSW encouraged patient to contact CSW as needed. CSW will continue to follow in case of SNF recommendation later this admission.  Employment status:  [employer] information:  Self Pay (Medicaid Pending) PT Recommendations:  Not assessed at this time Information  / Referral to community resources:  Shelter, Other (Comment Required) [employer] resources, food)  Patient/Family's Response to care:  Patient agreeable to receiving all resources except for substance abuse treatment centers. Patient's friends supportive and involved in patient's care. Patient appreciated social work intervention.  Patient/Family's Understanding of and Emotional Response to Diagnosis, Current Treatment, and Prognosis:  Patient understands reason for hospitalization. Patient appears happy with hospital care.  Emotional Assessment Appearance:  Appears stated age Attitude/Demeanor/Rapport:  Other (Pleasant) Affect (typically observed):  Accepting, Appropriate, Calm, Pleasant Orientation:  Oriented to Self, Oriented to Place, Oriented to  Time, Oriented to Situation Alcohol / Substance use:  Tobacco Use, Alcohol Use Psych involvement (Current and /or in the community):  No (Comment)  Discharge Needs  Concerns to be addressed:  Care Coordination Readmission within the last 30 days:  No Current discharge risk:  Homeless Barriers to Discharge:  Continued Medical Work up   Candie Chroman, LCSW 03/15/2016, 3:25 PM

## 2016-03-15 NOTE — Progress Notes (Signed)
Subjective:  Patient admitted yesterday with worsening chest discomfort.  He never followed up with me in the office following his hospitalization in 2015 and is documented has been noncompliant with diet, medications and has continued to smoke.  He has no chest discomfort today was found to have severe three-vessel disease as well as in-stent restenosis yesterday.  He has had issues with alcoholic pancreatitis.  Today he has no complaints of chest pain and states that he is committed to stopping smoking and taking better care of himself.  He is awaiting cardiac surgery consultation  Objective:  Vital Signs in the last 24 hours: BP 122/85 (BP Location: Left Arm)   Pulse 95   Temp 98.2 F (36.8 C) (Oral)   Resp 18   Ht 5\' 11"  (1.803 m)   Wt 68.9 kg (151 lb 12.8 oz)   SpO2 92%   BMI 21.17 kg/m   Physical Exam: Middle-aged male in no acute distress Lungs:  Clear Cardiac:  Regular rhythm, normal S1 and S2, no S3 Extremities:  No edema present, radial catheterization site clean and dry  Intake/Output from previous day: 01/03 0701 - 01/04 0700 In: 975.8 [P.O.:480; I.V.:495.8] Out: 1525 [Urine:1525]  Weight Filed Weights   03/14/16 0330 03/15/16 0400  Weight: 72.6 kg (160 lb 0.9 oz) 68.9 kg (151 lb 12.8 oz)    Lab Results: Basic Metabolic Panel:  Recent Labs  78/29/5599/05/28 0630 03/15/16 0633  NA 136 133*  K 4.3 4.5  CL 106 103  CO2 24 24  GLUCOSE 111* 142*  BUN 6 13  CREATININE 1.00 1.01   CBC:  Recent Labs  03/14/16 0630 03/15/16 0633  WBC 9.2 15.4*  NEUTROABS 6.1  --   HGB 15.7 13.9  HCT 45.8 42.3  MCV 94.8 96.1  PLT 212 204   Cardiac Enzymes: Troponin (Point of Care Test)  Recent Labs  03/14/16 0135  TROPIPOC 0.09*   Cardiac Panel (last 3 results)  Recent Labs  03/14/16 0400 03/14/16 0630 03/14/16 1015  TROPONINI 0.12* 0.11* 0.11*    Telemetry: Independently reviewed.  Sinus rhythm  Assessment/Plan:  1.  Non-STEMI currently pain-free 2.   Severe three-vessel coronary artery disease 3.  Medical noncompliance  Recommendations:  Awaiting cardiac surgery consultation and inpatient bypass grafting.     [name]. Spencer Douglas [name], [name]  MD [facility]  03/15/2016, 9:02 AM

## 2016-03-15 NOTE — Progress Notes (Signed)
ANTICOAGULATION CONSULT NOTE  Pharmacy Consult for heparin Indication: multivessel CAD  Allergies  Allergen Reactions  . Ibuprofen Other (See Comments)    Upset stomach  . Shellfish Allergy Nausea And Vomiting   Patient Measurements: Height: 5\' 11"  (180.3 cm) Weight: 151 lb 12.8 oz (68.9 kg) IBW/kg (Calculated) : 75.3  Vital Signs: Temp: 98.1 F (36.7 C) (01/04 1553) Temp Source: Oral (01/04 1553) BP: 133/88 (01/04 1553) Pulse Rate: 88 (01/04 1553)  Labs:  Recent Labs  03/14/16 0107 03/14/16 0400 03/14/16 0630 03/14/16 1015 03/15/16 0633 03/15/16 1450  HGB 17.3*  --  15.7  --  13.9  --   HCT 50.1  --  45.8  --  42.3  --   PLT 267  --  212  --  204  --   APTT  --   --  144*  --   --   --   LABPROT  --  11.8 13.1  --   --   --   INR  --  0.87 0.99  --   --   --   HEPARINUNFRC  --   --   --  0.44 0.22* 0.24*  CREATININE 1.07  --  1.00  --  1.01  --   TROPONINI 0.08* 0.12* 0.11* 0.11*  --   --    Estimated Creatinine Clearance: 86.2 mL/min (by C-G formula based on SCr of 1.01 mg/dL).  Assessment: 51yo male c/o CP associated w/ SOB, N/V, and dry cough since last pm, troponin elevated, started on IV heparin drip on 03/14/16. Noted history of medical noncompliance.   Patient s/p cath on 03/14/16 with multivessel CAD.Heparin IV resumed. TCTS being consulted for possible CABG. No bleeding noted per RN this AM.   Goal of Therapy:  Heparin level 0.3-0.7 units/ml Monitor platelets by anticoagulation protocol: Yes   Plan:  Increase heparin gtt to 1150 units/hr  6 hr heparin level Daily heparin level and CBC Monitor for s/sx of bleeding  Ruben Imony Graceyn Fodor, PharmD Clinical Pharmacist Pager: [phone] 03/15/2016 4:05 PM

## 2016-03-15 NOTE — Progress Notes (Signed)
  Echocardiogram 2D Echocardiogram has been performed.  Dereke Neumann L Androw 03/15/2016, 9:20 AM

## 2016-03-16 ENCOUNTER — Inpatient Hospital Stay (HOSPITAL_COMMUNITY): Payer: Self-pay

## 2016-03-16 ENCOUNTER — Encounter (HOSPITAL_COMMUNITY): Payer: Self-pay

## 2016-03-16 DIAGNOSIS — Z01810 Encounter for preprocedural cardiovascular examination: Secondary | ICD-10-CM

## 2016-03-16 LAB — VAS US DOPPLER PRE CABG
LEFT ECA DIAS: -10 cm/s
LEFT VERTEBRAL DIAS: -8 cm/s
Left CCA dist dias: -19 cm/s
Left CCA dist sys: -103 cm/s
Left CCA prox dias: 16 cm/s
Left CCA prox sys: 90 cm/s
Left ICA dist dias: -34 cm/s
Left ICA dist sys: -113 cm/s
Left ICA prox dias: -50 cm/s
Left ICA prox sys: -148 cm/s
RIGHT ECA DIAS: -9 cm/s
RIGHT VERTEBRAL DIAS: 12 cm/s
Right CCA prox dias: 16 cm/s
Right CCA prox sys: 88 cm/s
Right cca dist sys: -47 cm/s

## 2016-03-16 LAB — CBC
HCT: 41.4 % (ref 39.0–52.0)
Hemoglobin: 14 g/dL (ref 13.0–17.0)
MCH: 32.7 pg (ref 26.0–34.0)
MCHC: 33.8 g/dL (ref 30.0–36.0)
MCV: 96.7 fL (ref 78.0–100.0)
PLATELETS: 219 10*3/uL (ref 150–400)
RBC: 4.28 MIL/uL (ref 4.22–5.81)
RDW: 13.3 % (ref 11.5–15.5)
WBC: 11.9 10*3/uL — AB (ref 4.0–10.5)

## 2016-03-16 LAB — HEPARIN LEVEL (UNFRACTIONATED): Heparin Unfractionated: 0.68 IU/mL (ref 0.30–0.70)

## 2016-03-16 NOTE — Progress Notes (Signed)
Pre-op Cardiac Surgery  Carotid Findings:   Findings are consistent with a 40 - 59 percent stenosis involving the right internal carotid artery and the left internal carotid artery. Area of flow reversal in the proximal to mid portion of the right internal carotid artery of unknown etiology. The vertebral arteries demonstrate antegrade flow.  Upper Extremity Right Left  Brachial Pressures 113  Triphasic 124  Triphasic  Radial Waveforms Triphasic Triphasic  Ulnar Waveforms Triphasic Triphasic  Palmar Arch (Allen's Test) Palmar waveforms remain within normal limits with radial compression and are obliterated with ulnar compression. Palmar waveforms remain within normal limits with radial and ulnar compression.    Lower  Extremity Right Left  Dorsalis Pedis 92 119  Posterior Tibial 124 120  Ankle/Brachial Indices 1.0 0.97   Findings:   Right ABI of 1.0 and left ABI of 0.97 are suggestive of arterial flow within normal limits at rest.

## 2016-03-16 NOTE — Progress Notes (Signed)
[address]       [place] [address]             [phone]                   Procedure(s) (LRB): CORONARY ARTERY BYPASS GRAFTING (CABG) (N/A) TRANSESOPHAGEAL ECHOCARDIOGRAM (TEE) (N/A)  LOS: 2 days   Subjective: Patient had ct of abdomen last pm. With no evidence of pancreatic cancer plan to proceed with cabg monday  Objective: Vital signs in last 24 hours: Patient Vitals for the past 24 hrs:  BP Temp Temp src Pulse Resp SpO2 Weight  03/16/16 1133 127/78 98.4 F (36.9 C) Oral 72 18 94 % -  03/16/16 1022 (!) 148/78 - - 82 - - -  03/16/16 0417 128/82 97.1 F (36.2 C) Oral 76 20 96 % 149 lb 11.2 oz (67.9 kg)  03/16/16 0013 (!) 143/70 97.7 F (36.5 C) Oral 76 20 99 % -  03/15/16 1952 127/84 97.8 F (36.6 C) Oral 89 20 99 % -  03/15/16 1553 133/88 98.1 F (36.7 C) Oral 88 18 100 % -    Filed Weights   03/14/16 0330 03/15/16 0400 03/16/16 0417  Weight: 160 lb 0.9 oz (72.6 kg) 151 lb 12.8 oz (68.9 kg) 149 lb 11.2 oz (67.9 kg)    Hemodynamic parameters for last 24 hours:    Intake/Output from previous day: 01/04 0701 - 01/05 0700 In: 1790.4 [P.O.:1255; I.V.:535.4] Out: 300 [Urine:300] Intake/Output this shift: Total I/O In: 560 [P.O.:360; I.V.:200] Out: -   Scheduled Meds: . aspirin EC  81 mg Oral Daily  . atorvastatin  80 mg Oral q1800  . metoprolol tartrate  25 mg Oral BID  .  morphine injection  2 mg Intravenous Once  . nicotine  14 mg Transdermal Daily  . sodium chloride flush  3 mL Intravenous Q12H   Continuous Infusions: . heparin 1,150 Units/hr (03/15/16 2311)  . nitroGLYCERIN 45 mcg/min (03/16/16 1542)   PRN Meds:.sodium chloride, acetaminophen, morphine injection, nitroGLYCERIN, nitroGLYCERIN, ondansetron (ZOFRAN) IV, oxyCODONE-acetaminophen, sodium chloride flush  General appearance: alert and cooperative Neurologic: intact Heart: regular rate and rhythm, S1, S2 normal, no murmur, click, rub or gallop Lungs: clear to  auscultation bilaterally Abdomen: soft, non-tender; bowel sounds normal; no masses,  no organomegaly Extremities: extremities normal, atraumatic, no cyanosis or edema and Homans sign is negative, no sign of DVT  Lab Results: CBC: Recent Labs  03/15/16 0633 03/16/16 0420  WBC 15.4* 11.9*  HGB 13.9 14.0  HCT 42.3 41.4  PLT 204 219   BMET:  Recent Labs  03/14/16 0630 03/15/16 0633  NA 136 133*  K 4.3 4.5  CL 106 103  CO2 24 24  GLUCOSE 111* 142*  BUN 6 13  CREATININE 1.00 1.01  CALCIUM 8.2* 8.2*    PT/INR:  Recent Labs  03/14/16 0630  LABPROT 13.1  INR 0.99     Radiology Ct Abdomen W Contrast  Result Date: 03/16/2016 CLINICAL DATA:  History of pancreatitis.  Possible pancreatic mass. EXAM: CT ABDOMEN WITH CONTRAST TECHNIQUE: Multidetector CT imaging of the abdomen was performed using the standard protocol following bolus administration of intravenous contrast. CONTRAST:  ISOVUE-300 IOPAMIDOL (ISOVUE-300) INJECTION 61% COMPARISON:  CT scan 06/17/2015 and MRI 04/16/2015 FINDINGS: Lower chest: Emphysematous changes noted and minimal bibasilar atelectasis. No infiltrates or effusions. No worrisome pulmonary lesions. The heart is normal in size. No pericardial effusion. Coronary artery calcifications are noted. The distal esophagus  is grossly normal. Hepatobiliary: Stable mild intrahepatic biliary dilatation and mild dilatation of the common bile duct. No common bile duct stones. The gallbladder appears normal. No focal hepatic lesions. Pancreas: Some persistent low-attenuation and strandy ill-defined density between the pancreatic head in the duodenum. 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 This was much more significant on the CT scan from 03/05/2015. No findings for acute pancreatitis. No pancreatic ductal dilatation. Spleen: Normal size.  No focal lesions. Adrenals/Urinary Tract: The adrenal glands and kidneys are unremarkable and stable. No renal or ureteral  calculi. No bladder calculi. No renal or bladder mass. Stomach/Bowel: PE stomach, duodenum, small bowel and colon are unremarkable. No inflammatory changes, mass lesions or obstructive findings. The terminal ileum is normal. The appendix is normal. Vascular/Lymphatic: Stable moderate atherosclerotic calcifications involving the iliac arteries. Minimal scattered aortic calcifications. No aneurysm or dissection. The branch vessels are patent. The major venous structures are patent. Small scattered mesenteric and retroperitoneal lymph nodes are stable. No mass or overt adenopathy. Other: The prostate gland and seminal vesicles are unremarkable. No pelvic mass or adenopathy. No free pelvic fluid collections. No inguinal mass or adenopathy. Musculoskeletal: No significant bony findings. Incidental unilateral pars defect on the left at L5. IMPRESSION: 1. 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 Chronic inflammation between the pancreatic head and duodenum but overall much improved since prior studies. No pancreatic mass is identified. 2. Stable mild chronic intra and extrahepatic biliary dilatation likely due to distal common bile duct stricture/narrowing due to chronic pancreatitis. 3. Age advanced atherosclerotic calcifications involving the iliac arteries. Electronically Signed   By: Rudie MeyerP.  Gallerani M.D.   On: 03/16/2016 07:56     Assessment/Plan: Plan:  CORONARY ARTERY BYPASS GRAFTING (CABG) (N/A) TRANSESOPHAGEAL ECHOCARDIOGRAM (TEE) (N/A)  Discussed with patient proceeding with coronary artery bypass grafting. They are schedule has been changed to accommodate him on Monday. I discussed the risks and options with the patient in detail. 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  The goals  risks and alternatives of the planned surgical procedure cabg  have been discussed with the patient in detail. The risks of the procedure including death, infection, stroke, myocardial infarction, bleeding, blood transfusion have all been discussed specifically.  I have quoted Minerva EndsJonathan Crumrine a 3 % of perioperative mortality and a complication rate as high as 40 %. The patient's questions have been answered.Minerva EndsJonathan Hoose is willing  to proceed with the planned procedure.  Preop Orders have been placed   Delight OvensEdward B Aicha Clingenpeel MD 03/16/2016 3:51 PM

## 2016-03-16 NOTE — Progress Notes (Signed)
ANTICOAGULATION CONSULT NOTE - Follow Up Consult  Pharmacy Consult for Heparin  Indication: Multi-vessel disease, possible CABG next week  Allergies  Allergen Reactions  . Ibuprofen Other (See Comments)    Upset stomach  . Shellfish Allergy Nausea And Vomiting   Patient Measurements: Height: 5\' 11"  (180.3 cm) Weight: 149 lb 11.2 oz (67.9 kg) (scale c) IBW/kg (Calculated) : 75.3  Vital Signs: Temp: 98.4 F (36.9 C) (01/05 1133) Temp Source: Oral (01/05 1133) BP: 127/78 (01/05 1133) Pulse Rate: 72 (01/05 1133)  Labs:  Recent Labs  03/14/16 0107 03/14/16 0400 03/14/16 0630  03/14/16 1015 03/15/16 0633 03/15/16 1450 03/15/16 2306 03/16/16 0420  HGB 17.3*  --  15.7  --   --  13.9  --   --  14.0  HCT 50.1  --  45.8  --   --  42.3  --   --  41.4  PLT 267  --  212  --   --  204  --   --  219  APTT  --   --  144*  --   --   --   --   --   --   LABPROT  --  11.8 13.1  --   --   --   --   --   --   INR  --  0.87 0.99  --   --   --   --   --   --   HEPARINUNFRC  --   --   --   < > 0.44 0.22* 0.24* 0.52 0.68  CREATININE 1.07  --  1.00  --   --  1.01  --   --   --   TROPONINI 0.08* 0.12* 0.11*  --  0.11*  --   --   --   --   < > = values in this interval not displayed.  Estimated Creatinine Clearance: 85 mL/min (by C-G formula based on SCr of 1.01 mg/dL).    Assessment: On heparin s/p cath awaiting CABG early next week for severe 3 vessel CAD.  Heparin level this AM is 0.68 on 1150 units/hr.  Heparin level therapeutic x2 No bleeding noted. CBC wnl.  Goal of Therapy:  Heparin level 0.3-0.7 units/ml Monitor platelets by anticoagulation protocol: Yes   Plan:  -Cont heparin at 1150 units/hr -Daily HL, CBC  Thank you for allowing pharmacy to be part of this patients care team.  Noah Delaineuth Brenda Samano, RPh Clinical Pharmacist Pager: [phone]  Main Pharmacy [phone]  03/16/2016,2:25 PM

## 2016-03-16 NOTE — Progress Notes (Signed)
ANTICOAGULATION CONSULT NOTE - Follow Up Consult  Pharmacy Consult for Heparin  Indication: Multi-vessel disease, possible CABG next week  Allergies  Allergen Reactions  . Ibuprofen Other (See Comments)    Upset stomach  . Shellfish Allergy Nausea And Vomiting   Patient Measurements: Height: 5\' 11"  (180.3 cm) Weight: 151 lb 12.8 oz (68.9 kg) IBW/kg (Calculated) : 75.3  Vital Signs: Temp: 97.8 F (36.6 C) (01/04 1952) Temp Source: Oral (01/04 1952) BP: 127/84 (01/04 1952) Pulse Rate: 89 (01/04 1952)  Labs:  Recent Labs  03/14/16 0107 03/14/16 0400 03/14/16 0630  03/14/16 1015 03/15/16 0633 03/15/16 1450 03/15/16 2306  HGB 17.3*  --  15.7  --   --  13.9  --   --   HCT 50.1  --  45.8  --   --  42.3  --   --   PLT 267  --  212  --   --  204  --   --   APTT  --   --  144*  --   --   --   --   --   LABPROT  --  11.8 13.1  --   --   --   --   --   INR  --  0.87 0.99  --   --   --   --   --   HEPARINUNFRC  --   --   --   < > 0.44 0.22* 0.24* 0.52  CREATININE 1.07  --  1.00  --   --  1.01  --   --   TROPONINI 0.08* 0.12* 0.11*  --  0.11*  --   --   --   < > = values in this interval not displayed.  Estimated Creatinine Clearance: 86.2 mL/min (by C-G formula based on SCr of 1.01 mg/dL).    Assessment: On heparin s/p cath awaiting CABG next week it CT chest/abd/pelvis is OK, heparin level therapeutic x 1  Goal of Therapy:  Heparin level 0.3-0.7 units/ml Monitor platelets by anticoagulation protocol: Yes   Plan:  -Cont heparin at 1150 units/hr -Confirmatory HL with AM labs  Abran DukeLedford, Zahid Carneiro 03/16/2016,12:01 AM

## 2016-03-16 NOTE — Progress Notes (Signed)
Pt sts he has had some chest tightness in bed. On IV NTG. Will hold ambulation for now. Discussed sternal precautions, mobility, d/c planning and IS. Voiced understanding. Gave pt [facility] booklet and video to watch. He voices that he has no where to stay at d/c and would be open to SNF. No family he can stay with.  [phone] Ethelda ChickKristan Aiken Withem CES, ACSM 3:09 PM 03/16/2016

## 2016-03-16 NOTE — Progress Notes (Signed)
Subjective:  No chest pain since yesterday. Dr. Dennie MaizesGerhardt's note was appreciated. I independently reviewed the CT scan done last night and he did not have a pancreatic mass. Appears be more consistent with resolving chronic pancreatitis.  Objective:  Vital Signs in the last 24 hours: BP 127/78 (BP Location: Right Arm)   Pulse 72   Temp 98.4 F (36.9 C) (Oral)   Resp 18   Ht 5\' 11"  (1.803 m)   Wt 67.9 kg (149 lb 11.2 oz) Comment: scale c  SpO2 94%   BMI 20.88 kg/m   Physical Exam: Middle-aged male in no acute distress Lungs:  Clear Cardiac:  Regular rhythm, normal S1 and S2, no S3 Extremities:  No edema present.  Intake/Output from previous day: 01/04 0701 - 01/05 0700 In: 1569.1 [P.O.:1255; I.V.:314.1] Out: 300 [Urine:300]  Weight Filed Weights   03/14/16 0330 03/15/16 0400 03/16/16 0417  Weight: 72.6 kg (160 lb 0.9 oz) 68.9 kg (151 lb 12.8 oz) 67.9 kg (149 lb 11.2 oz)    Lab Results: Basic Metabolic Panel:  Recent Labs  40/98/1099/05/27 0630 03/15/16 0633  NA 136 133*  K 4.3 4.5  CL 106 103  CO2 24 24  GLUCOSE 111* 142*  BUN 6 13  CREATININE 1.00 1.01   CBC:  Recent Labs  03/14/16 0630 03/15/16 0633 03/16/16 0420  WBC 9.2 15.4* 11.9*  NEUTROABS 6.1  --   --   HGB 15.7 13.9 14.0  HCT 45.8 42.3 41.4  MCV 94.8 96.1 96.7  PLT 212 204 219   Cardiac Enzymes: Troponin (Point of Care Test)  Recent Labs  03/14/16 0135  TROPIPOC 0.09*   Cardiac Panel (last 3 results)  Recent Labs  03/14/16 0400 03/14/16 0630 03/14/16 1015  TROPONINI 0.12* 0.11* 0.11*    Telemetry: Independently reviewed.  Sinus rhythm  Assessment/Plan:  1.  Non-STEMI currently pain-free 2.  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  Medical noncompliance 4.  Chronic pancreatitis  Recommendations:  Surgery is planned for early next week. Again stressed importance of medical follow-up and compliance with the patient. Will need to have primary care  follow-up arranged discharge also.     Douglas PalmerW. Spencer [name], [name]  MD [facility]  03/16/2016, 1:09 PM

## 2016-03-17 LAB — CBC
HEMATOCRIT: 41.2 % (ref 39.0–52.0)
HEMOGLOBIN: 13.9 g/dL (ref 13.0–17.0)
MCH: 32 pg (ref 26.0–34.0)
MCHC: 33.7 g/dL (ref 30.0–36.0)
MCV: 94.7 fL (ref 78.0–100.0)
Platelets: 216 10*3/uL (ref 150–400)
RBC: 4.35 MIL/uL (ref 4.22–5.81)
RDW: 12.8 % (ref 11.5–15.5)
WBC: 8.5 10*3/uL (ref 4.0–10.5)

## 2016-03-17 LAB — HEPARIN LEVEL (UNFRACTIONATED): HEPARIN UNFRACTIONATED: 0.63 [IU]/mL (ref 0.30–0.70)

## 2016-03-17 NOTE — Progress Notes (Signed)
Subjective:  No chest pain since yesterday. Dr. Dennie MaizesGerhardt's note was appreciated. CT scan done last night does not show a pancreatic mass. Appears be more consistent with resolving chronic pancreatitis.  Objective:  Vital Signs in the last 24 hours: BP 122/90 (BP Location: Right Arm)   Pulse 84   Temp 98 F (36.7 C) (Oral)   Resp 18   Ht 5\' 11"  (1.803 m)   Wt 147 lb 14.4 oz (67.1 kg) Comment: scale c  SpO2 99%   BMI 20.63 kg/m   Physical Exam: Middle-aged male in no acute distress Lungs:  Clear Cardiac:  Regular rhythm, normal S1 and S2, no S3 Extremities:  No edema present.  Intake/Output from previous day: 01/05 0701 - 01/06 0700 In: 1955.2 [P.O.:820; I.V.:1135.2] Out: 400 [Urine:400]  Weight Filed Weights   03/15/16 0400 03/16/16 0417 03/17/16 0549  Weight: 151 lb 12.8 oz (68.9 kg) 149 lb 11.2 oz (67.9 kg) 147 lb 14.4 oz (67.1 kg)    Lab Results: Basic Metabolic Panel:  Recent Labs  14/78/2899/06/27 0633  NA 133*  K 4.5  CL 103  CO2 24  GLUCOSE 142*  BUN 13  CREATININE 1.01   CBC:  Recent Labs  03/16/16 0420 03/17/16 0518  WBC 11.9* 8.5  HGB 14.0 13.9  HCT 41.4 41.2  MCV 96.7 94.7  PLT 219 216   Cardiac Enzymes: Troponin (Point of Care Test) No results for input(s): TROPIPOC in the last 72 hours. Cardiac Panel (last 3 results) No results for input(s): CKTOTAL, CKMB, TROPONINI, RELINDX in the last 72 hours.  Telemetry: Independently reviewed.  Sinus rhythm  Assessment/Plan:  1.  Non-STEMI currently pain-free 2.  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  Medical noncompliance 4.  Chronic pancreatitis  Recommendations:  CABG is planned for early next week. Again stressed importance of medical follow-up and compliance with the patient. Will need to have primary care follow-up arranged discharge also.  - no chest pain currently.  Continue ASA/statin/IV Heparin gtt/BB/IV NTG gtt.   [name]. Spencer Tilley, [name]  MD  [facility]  03/17/2016, 12:34 PM

## 2016-03-17 NOTE — Progress Notes (Signed)
ANTICOAGULATION CONSULT NOTE - Follow Up Consult  Pharmacy Consult for Heparin  Indication: Multi-vessel disease, CABG next week  Allergies  Allergen Reactions  . Ibuprofen Other (See Comments)    Upset stomach  . Shellfish Allergy Nausea And Vomiting   Patient Measurements: Height: 5\' 11"  (180.3 cm) Weight: 147 lb 14.4 oz (67.1 kg) (scale c) IBW/kg (Calculated) : 75.3  Vital Signs: Temp: 98 F (36.7 C) (01/06 1207) Temp Source: Oral (01/06 1207) BP: 122/90 (01/06 1207) Pulse Rate: 84 (01/06 1207)  Labs:  Recent Labs  03/15/16 [id number]  03/15/16 2306 03/16/16 0420 03/17/16 0518  HGB 13.9  --   --  14.0 13.9  HCT 42.3  --   --  41.4 41.2  PLT 204  --   --  219 216  HEPARINUNFRC 0.22*  < > 0.52 0.68 0.63  CREATININE 1.01  --   --   --   --   < > = values in this interval not displayed.  Estimated Creatinine Clearance: 84 mL/min (by C-G formula based on SCr of 1.01 mg/dL).  Assessment: 50 yom continues on IV heparin while awaiting CABG. Heparin level this AM is at goal. No bleeding noted and CBC is WNL.   Goal of Therapy:  Heparin level 0.3-0.7 units/ml Monitor platelets by anticoagulation protocol: Yes   Plan:  -Cont heparin at 1150 units/hr -Daily HL, CBC  Lysle Pearlachel Leeba Barbe, PharmD, BCPS 03/17/2016 2:53 PM

## 2016-03-18 LAB — COMPREHENSIVE METABOLIC PANEL
ALT: 22 U/L (ref 17–63)
AST: 27 U/L (ref 15–41)
Albumin: 3.3 g/dL — ABNORMAL LOW (ref 3.5–5.0)
Alkaline Phosphatase: 102 U/L (ref 38–126)
Anion gap: 7 (ref 5–15)
BUN: 9 mg/dL (ref 6–20)
CO2: 31 mmol/L (ref 22–32)
Calcium: 8.9 mg/dL (ref 8.9–10.3)
Chloride: 101 mmol/L (ref 101–111)
Creatinine, Ser: 1.1 mg/dL (ref 0.61–1.24)
GFR calc Af Amer: >60 mL/min (ref >60)
GFR calc non Af Amer: >60 mL/min (ref >60)
Glucose, Bld: 99 mg/dL (ref 65–99)
Potassium: 4 mmol/L (ref 3.5–5.1)
Sodium: 139 mmol/L (ref 135–145)
Total Bilirubin: 0.4 mg/dL (ref 0.3–1.2)
Total Protein: 5.8 g/dL — ABNORMAL LOW (ref 6.5–8.1)

## 2016-03-18 LAB — TYPE AND SCREEN
ABO/RH(D): O POS
Antibody Screen: NEGATIVE

## 2016-03-18 LAB — CBC
HCT: 40.6 % (ref 39.0–52.0)
Hemoglobin: 13.9 g/dL (ref 13.0–17.0)
MCH: 31.9 pg (ref 26.0–34.0)
MCHC: 34.2 g/dL (ref 30.0–36.0)
MCV: 93.1 fL (ref 78.0–100.0)
PLATELETS: 228 10*3/uL (ref 150–400)
RBC: 4.36 MIL/uL (ref 4.22–5.81)
RDW: 12.4 % (ref 11.5–15.5)
WBC: 10.9 10*3/uL — AB (ref 4.0–10.5)

## 2016-03-18 LAB — HEPARIN LEVEL (UNFRACTIONATED): HEPARIN UNFRACTIONATED: 0.48 [IU]/mL (ref 0.30–0.70)

## 2016-03-18 LAB — ABO/RH: ABO/RH(D): O POS

## 2016-03-18 MED ORDER — DOPAMINE-DEXTROSE 3.2-5 MG/ML-% IV SOLN
0.0000 ug/kg/min | INTRAVENOUS | Status: DC
  Filled 2016-03-18: qty 250

## 2016-03-18 MED ORDER — SODIUM CHLORIDE 0.9 % IV SOLN
INTRAVENOUS | Status: DC
  Filled 2016-03-18: qty 30

## 2016-03-18 MED ORDER — DEXTROSE 5 % IV SOLN: 750.0000 mg | INTRAVENOUS | Status: DC

## 2016-03-18 MED ORDER — VANCOMYCIN HCL 10 G IV SOLR: 1250.0000 mg | INTRAVENOUS | Status: DC

## 2016-03-18 MED ORDER — TRANEXAMIC ACID (OHS) PUMP PRIME SOLUTION: 2.0000 mg/kg | INTRAVENOUS | Status: DC

## 2016-03-18 MED ORDER — DEXTROSE 5 % IV SOLN
750.0000 mg | INTRAVENOUS | Status: DC
  Filled 2016-03-18 (×2): qty 750

## 2016-03-18 MED ORDER — PAPAVERINE HCL 30 MG/ML IJ SOLN: INTRAMUSCULAR | Status: DC

## 2016-03-18 MED ORDER — TRANEXAMIC ACID (OHS) PUMP PRIME SOLUTION
2.0000 mg/kg | INTRAVENOUS | Status: DC
  Filled 2016-03-18: qty 1.35

## 2016-03-18 MED ORDER — PHENYLEPHRINE HCL 10 MG/ML IJ SOLN
30.0000 ug/min | INTRAMUSCULAR | Status: AC
  Administered 2016-03-19: 10 ug/min via INTRAVENOUS
  Administered 2016-03-19: 40 ug/min via INTRAVENOUS
  Filled 2016-03-18: qty 2

## 2016-03-18 MED ORDER — POTASSIUM CHLORIDE 2 MEQ/ML IV SOLN
80.0000 meq | INTRAVENOUS | Status: DC
  Filled 2016-03-18: qty 40

## 2016-03-18 MED ORDER — SODIUM CHLORIDE 0.9 % IV SOLN
INTRAVENOUS | Status: AC
  Administered 2016-03-19: 1 [IU]/h via INTRAVENOUS
  Filled 2016-03-18: qty 2.5

## 2016-03-18 MED ORDER — DEXMEDETOMIDINE HCL IN NACL 400 MCG/100ML IV SOLN: 0.1000 ug/kg/h | INTRAVENOUS | Status: DC

## 2016-03-18 MED ORDER — SODIUM CHLORIDE 0.9 % IV SOLN: INTRAVENOUS | Status: DC

## 2016-03-18 MED ORDER — METOPROLOL TARTRATE 12.5 MG HALF TABLET
12.5000 mg | ORAL_TABLET | Freq: Once | ORAL | Status: AC
  Administered 2016-03-19: 12.5 mg via ORAL
  Filled 2016-03-18: qty 1

## 2016-03-18 MED ORDER — CHLORHEXIDINE GLUCONATE CLOTH 2 % EX PADS
6.0000 | MEDICATED_PAD | Freq: Once | CUTANEOUS | Status: AC
  Administered 2016-03-19: 6 via TOPICAL

## 2016-03-18 MED ORDER — DEXTROSE 5 % IV SOLN: 1.5000 g | INTRAVENOUS | Status: DC

## 2016-03-18 MED ORDER — CHLORHEXIDINE GLUCONATE CLOTH 2 % EX PADS
6.0000 | MEDICATED_PAD | Freq: Once | CUTANEOUS | Status: AC
  Administered 2016-03-18: 6 via TOPICAL

## 2016-03-18 MED ORDER — NITROGLYCERIN IN D5W 200-5 MCG/ML-% IV SOLN: 2.0000 ug/min | INTRAVENOUS | Status: DC

## 2016-03-18 MED ORDER — MAGNESIUM SULFATE 50 % IJ SOLN: 40.0000 meq | INTRAMUSCULAR | Status: DC

## 2016-03-18 MED ORDER — BISACODYL 5 MG PO TBEC
5.0000 mg | DELAYED_RELEASE_TABLET | Freq: Once | ORAL | Status: AC
  Administered 2016-03-18: 5 mg via ORAL
  Filled 2016-03-18: qty 1

## 2016-03-18 MED ORDER — TRANEXAMIC ACID (OHS) BOLUS VIA INFUSION
15.0000 mg/kg | INTRAVENOUS | Status: AC
  Administered 2016-03-19: 1009.5 mg via INTRAVENOUS
  Filled 2016-03-18: qty 1010

## 2016-03-18 MED ORDER — PHENYLEPHRINE HCL 10 MG/ML IJ SOLN: 30.0000 ug/min | INTRAMUSCULAR | Status: DC

## 2016-03-18 MED ORDER — DEXTROSE 5 % IV SOLN
0.0000 ug/min | INTRAVENOUS | Status: DC
Start: 2016-03-19 — End: 2016-03-18

## 2016-03-18 MED ORDER — TRANEXAMIC ACID 1000 MG/10ML IV SOLN: 1.5000 mg/kg/h | INTRAVENOUS | Status: DC

## 2016-03-18 MED ORDER — TRANEXAMIC ACID (OHS) BOLUS VIA INFUSION: 15.0000 mg/kg | INTRAVENOUS | Status: DC

## 2016-03-18 MED ORDER — CEFUROXIME SODIUM 1.5 G IJ SOLR
1.5000 g | INTRAMUSCULAR | Status: AC
  Administered 2016-03-19: 1.5 g via INTRAVENOUS
  Administered 2016-03-19: .75 g via INTRAVENOUS
  Filled 2016-03-18 (×2): qty 1.5

## 2016-03-18 MED ORDER — EPINEPHRINE PF 1 MG/ML IJ SOLN
0.0000 ug/min | INTRAVENOUS | Status: DC
  Filled 2016-03-18: qty 4

## 2016-03-18 MED ORDER — POTASSIUM CHLORIDE 2 MEQ/ML IV SOLN: 80.0000 meq | INTRAVENOUS | Status: DC

## 2016-03-18 MED ORDER — TRANEXAMIC ACID 1000 MG/10ML IV SOLN
1.5000 mg/kg/h | INTRAVENOUS | Status: AC
  Administered 2016-03-19: 1.5 mg/kg/h via INTRAVENOUS
  Filled 2016-03-18: qty 25

## 2016-03-18 MED ORDER — NITROGLYCERIN IN D5W 200-5 MCG/ML-% IV SOLN
2.0000 ug/min | INTRAVENOUS | Status: AC
  Administered 2016-03-18: 45 ug/min via INTRAVENOUS
  Filled 2016-03-18: qty 250

## 2016-03-18 MED ORDER — CHLORHEXIDINE GLUCONATE 0.12 % MT SOLN
15.0000 mL | Freq: Once | OROMUCOSAL | Status: AC
  Administered 2016-03-19: 15 mL via OROMUCOSAL
  Filled 2016-03-18: qty 15

## 2016-03-18 MED ORDER — VANCOMYCIN HCL 10 G IV SOLR
1250.0000 mg | INTRAVENOUS | Status: AC
  Administered 2016-03-19: 1250 mg via INTRAVENOUS
  Filled 2016-03-18: qty 1250

## 2016-03-18 MED ORDER — TEMAZEPAM 15 MG PO CAPS: 15.0000 mg | ORAL_CAPSULE | Freq: Once | ORAL | Status: DC | PRN

## 2016-03-18 MED ORDER — MAGNESIUM SULFATE 50 % IJ SOLN
40.0000 meq | INTRAMUSCULAR | Status: DC
  Filled 2016-03-18: qty 10

## 2016-03-18 MED ORDER — DEXMEDETOMIDINE HCL IN NACL 400 MCG/100ML IV SOLN
0.1000 ug/kg/h | INTRAVENOUS | Status: AC
  Administered 2016-03-19: 0.7 ug/kg/h via INTRAVENOUS
  Filled 2016-03-18: qty 100

## 2016-03-18 MED ORDER — PLASMA-LYTE 148 IV SOLN
INTRAVENOUS | Status: AC
  Administered 2016-03-19: 500 mL
  Filled 2016-03-18: qty 2.5

## 2016-03-18 NOTE — Progress Notes (Signed)
Subjective:  Denies CP or dyspnea this AM  Objective:  Vital Signs in the last 24 hours: BP 120/71 (BP Location: Right Arm)   Pulse 68   Temp 98.2 F (36.8 C) (Oral)   Resp 18   Ht 5\' 11"  (1.803 m)   Wt 148 lb 6.4 oz (67.3 kg) Comment: scale c  SpO2 93%   BMI 20.70 kg/m   Physical Exam: WD/WN NAD HEENT: nl Neck: supple Lungs:  Clear Cardiac:  Regular rhythm, normal S1 and S2, no S3 Abd: soft, NT/ND Extremities:  No edema present. Small hematoma lateral aspect of right thigh Neuro: grossly intact  Intake/Output from previous day: 01/06 0701 - 01/07 0700 In: 1686 [P.O.:940; I.V.:746] Out: 500 [Urine:500]  Weight Filed Weights   03/16/16 0417 03/17/16 0549 03/18/16 0511  Weight: 149 lb 11.2 oz (67.9 kg) 147 lb 14.4 oz (67.1 kg) 148 lb 6.4 oz (67.3 kg)    Lab Results: CBC:  Recent Labs  03/17/16 0518 03/18/16 0216  WBC 8.5 10.9*  HGB 13.9 13.9  HCT 41.2 40.6  MCV 94.7 93.1  PLT 216 228    Telemetry: Independently reviewed.  Sinus rhythm  Assessment/Plan:  1.  Non-STEMI currently pain-free 2.  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  Medical noncompliance 4.  Chronic pancreatitis  Recommendations:  CABG is planned for tomorrow. Continue ASA/statin/IV Heparin gtt/BB/IV NTG gtt.   Olga MillersBrian Crenshaw  MD [facility]iology  03/18/2016, 10:26 AM

## 2016-03-18 NOTE — Progress Notes (Signed)
4 Days Post-Op Procedure(s) (LRB): Left Heart Cath and Coronary Angiography (N/A) Subjective: Stable w/o complaints Ready for CABG tomorrow by Dr Douglas FellsEBG  Objective: Vital signs in last 24 hours: Temp:  [97.7 F (36.5 C)-98.6 F (37 C)] 97.7 F (36.5 C) (01/07 1135) Pulse Rate:  [68-89] 89 (01/07 1135) Cardiac Rhythm: Normal sinus rhythm (01/07 0845) Resp:  [18] 18 (01/07 1135) BP: (112-122)/(70-95) 119/95 (01/07 1135) SpO2:  [93 %-99 %] 95 % (01/07 1135) Weight:  [148 lb 6.4 oz (67.3 kg)] 148 lb 6.4 oz (67.3 kg) (01/07 0511)  Hemodynamic parameters for last 24 hours:  nsr  Intake/Output from previous day: 01/06 0701 - 01/07 0700 In: 1686 [P.O.:940; I.V.:746] Out: 500 [Urine:500] Intake/Output this shift: Total I/O In: 340 [P.O.:340] Out: 100 [Urine:100]  Lungs clear Neuro intact  Lab Results:  Recent Labs  03/17/16 0518 03/18/16 0216  WBC 8.5 10.9*  HGB 13.9 13.9  HCT 41.2 40.6  PLT 216 228   BMET: No results for input(s): NA, K, CL, CO2, GLUCOSE, BUN, CREATININE, CALCIUM in the last 72 hours.  PT/INR: No results for input(s): LABPROT, INR in the last 72 hours. ABG    Component Value Date/Time   TCO2 24 12/10/2012 2304   CBG (last 3)  No results for input(s): GLUCAP in the last 72 hours.  Assessment/Plan: S/P Procedure(s) (LRB): Left Heart Cath and Coronary Angiography (N/A) Orders in place forCABG   LOS: 4 days    Douglas [name] 03/18/2016

## 2016-03-18 NOTE — Progress Notes (Signed)
ANTICOAGULATION CONSULT NOTE - Follow Up Consult  Pharmacy Consult for Heparin  Indication: Multi-vessel disease, CABG next week  Allergies  Allergen Reactions  . Ibuprofen Nausea And Vomiting and Other (See Comments)    UPSET STOMACH  . Shellfish Allergy Nausea And Vomiting   Patient Measurements: Height: 5\' 11"  (180.3 cm) Weight: 148 lb 6.4 oz (67.3 kg) (scale c) IBW/kg (Calculated) : 75.3  Vital Signs: Temp: 97.7 F (36.5 C) (01/07 1135) Temp Source: Oral (01/07 1135) BP: 119/95 (01/07 1135) Pulse Rate: 89 (01/07 1135)  Labs:  Recent Labs  03/16/16 0420 03/17/16 0518 03/18/16 0216 03/18/16 1213  HGB 14.0 13.9 13.9  --   HCT 41.4 41.2 40.6  --   PLT 219 216 228  --   HEPARINUNFRC 0.68 0.63 0.48  --   CREATININE  --   --   --  1.10    Estimated Creatinine Clearance: 77.3 mL/min (by C-G formula based on SCr of 1.1 mg/dL).  Assessment: 50 yom continues on IV heparin while awaiting CABG. Heparin level this AM is at goal. No bleeding noted and CBC is WNL.   Goal of Therapy:  Heparin level 0.3-0.7 units/ml Monitor platelets by anticoagulation protocol: Yes   Plan:  -Cont heparin at 1150 units/hr -Daily HL, CBC  Lysle Pearlachel Meklit Cotta, PharmD, BCPS 03/18/2016 1:43 PM

## 2016-03-19 ENCOUNTER — Encounter (HOSPITAL_COMMUNITY)
Admission: EM | Disposition: A | Discharge status: Skilled Nursing Facility | Payer: Self-pay | Source: Home / Self Care | Attending: Cardiothoracic Surgery

## 2016-03-19 ENCOUNTER — Inpatient Hospital Stay (HOSPITAL_COMMUNITY): Payer: Self-pay | Admitting: Anesthesiology

## 2016-03-19 ENCOUNTER — Inpatient Hospital Stay (HOSPITAL_COMMUNITY): Payer: Self-pay

## 2016-03-19 ENCOUNTER — Encounter (HOSPITAL_COMMUNITY): Payer: Self-pay

## 2016-03-19 DIAGNOSIS — Z951 Presence of aortocoronary bypass graft: Secondary | ICD-10-CM

## 2016-03-19 DIAGNOSIS — I25110 Atherosclerotic heart disease of native coronary artery with unstable angina pectoris: Secondary | ICD-10-CM

## 2016-03-19 HISTORY — PX: CORONARY ARTERY BYPASS GRAFT: SHX141

## 2016-03-19 HISTORY — PX: TEE WITHOUT CARDIOVERSION: SHX5443

## 2016-03-19 LAB — GLUCOSE, CAPILLARY
GLUCOSE-CAPILLARY: 130 mg/dL — AB (ref 65–99)
GLUCOSE-CAPILLARY: 125 mg/dL — AB (ref 65–99)
GLUCOSE-CAPILLARY: 100 mg/dL — AB (ref 65–99)
Glucose-Capillary: 129 mg/dL — ABNORMAL HIGH (ref 65–99)
Glucose-Capillary: 135 mg/dL — ABNORMAL HIGH (ref 65–99)
Glucose-Capillary: 111 mg/dL — ABNORMAL HIGH (ref 65–99)
Glucose-Capillary: 113 mg/dL — ABNORMAL HIGH (ref 65–99)
Glucose-Capillary: 116 mg/dL — ABNORMAL HIGH (ref 65–99)
Glucose-Capillary: 157 mg/dL — ABNORMAL HIGH (ref 65–99)

## 2016-03-19 LAB — BASIC METABOLIC PANEL
Anion gap: 8 (ref 5–15)
BUN: 11 mg/dL (ref 6–20)
CALCIUM: 8.4 mg/dL — AB (ref 8.9–10.3)
CO2: 28 mmol/L (ref 22–32)
CREATININE: 1.28 mg/dL — AB (ref 0.61–1.24)
Chloride: 99 mmol/L — ABNORMAL LOW (ref 101–111)
Glucose, Bld: 111 mg/dL — ABNORMAL HIGH (ref 65–99)
Potassium: 4.1 mmol/L (ref 3.5–5.1)
SODIUM: 135 mmol/L (ref 135–145)

## 2016-03-19 LAB — POCT I-STAT, CHEM 8
BUN: 9 mg/dL (ref 6–20)
BUN: 7 mg/dL (ref 6–20)
BUN: 9 mg/dL (ref 6–20)
BUN: 8 mg/dL (ref 6–20)
BUN: 9 mg/dL (ref 6–20)
BUN: 9 mg/dL (ref 6–20)
CALCIUM ION: 1.23 mmol/L (ref 1.15–1.40)
CALCIUM ION: 1.2 mmol/L (ref 1.15–1.40)
CALCIUM ION: 1.05 mmol/L — AB (ref 1.15–1.40)
CHLORIDE: 101 mmol/L (ref 101–111)
CHLORIDE: 98 mmol/L — AB (ref 101–111)
CHLORIDE: 97 mmol/L — AB (ref 101–111)
Calcium, Ion: 0.93 mmol/L — ABNORMAL LOW (ref 1.15–1.40)
Calcium, Ion: 1.07 mmol/L — ABNORMAL LOW (ref 1.15–1.40)
Calcium, Ion: 1.14 mmol/L — ABNORMAL LOW (ref 1.15–1.40)
Chloride: 101 mmol/L (ref 101–111)
Chloride: 99 mmol/L — ABNORMAL LOW (ref 101–111)
Chloride: 104 mmol/L (ref 101–111)
Creatinine, Ser: 0.8 mg/dL (ref 0.61–1.24)
Creatinine, Ser: 0.7 mg/dL (ref 0.61–1.24)
Creatinine, Ser: 0.8 mg/dL (ref 0.61–1.24)
Creatinine, Ser: 0.8 mg/dL (ref 0.61–1.24)
Creatinine, Ser: 0.9 mg/dL (ref 0.61–1.24)
Creatinine, Ser: 0.9 mg/dL (ref 0.61–1.24)
GLUCOSE: 109 mg/dL — AB (ref 65–99)
GLUCOSE: 113 mg/dL — AB (ref 65–99)
GLUCOSE: 93 mg/dL (ref 65–99)
Glucose, Bld: 118 mg/dL — ABNORMAL HIGH (ref 65–99)
Glucose, Bld: 107 mg/dL — ABNORMAL HIGH (ref 65–99)
Glucose, Bld: 112 mg/dL — ABNORMAL HIGH (ref 65–99)
HCT: 39 % (ref 39.0–52.0)
HCT: 37 % — ABNORMAL LOW (ref 39.0–52.0)
HCT: 26 % — ABNORMAL LOW (ref 39.0–52.0)
HCT: 36 % — ABNORMAL LOW (ref 39.0–52.0)
HEMATOCRIT: 29 % — AB (ref 39.0–52.0)
HEMATOCRIT: 28 % — AB (ref 39.0–52.0)
HEMOGLOBIN: 12.6 g/dL — AB (ref 13.0–17.0)
HEMOGLOBIN: 9.9 g/dL — AB (ref 13.0–17.0)
HEMOGLOBIN: 8.8 g/dL — AB (ref 13.0–17.0)
Hemoglobin: 13.3 g/dL (ref 13.0–17.0)
Hemoglobin: 9.5 g/dL — ABNORMAL LOW (ref 13.0–17.0)
Hemoglobin: 12.2 g/dL — ABNORMAL LOW (ref 13.0–17.0)
POTASSIUM: 3.9 mmol/L (ref 3.5–5.1)
POTASSIUM: 4 mmol/L (ref 3.5–5.1)
Potassium: 3.9 mmol/L (ref 3.5–5.1)
Potassium: 3.7 mmol/L (ref 3.5–5.1)
Potassium: 4.2 mmol/L (ref 3.5–5.1)
Potassium: 4.4 mmol/L (ref 3.5–5.1)
SODIUM: 137 mmol/L (ref 135–145)
SODIUM: 136 mmol/L (ref 135–145)
SODIUM: 137 mmol/L (ref 135–145)
SODIUM: 133 mmol/L — AB (ref 135–145)
Sodium: 134 mmol/L — ABNORMAL LOW (ref 135–145)
Sodium: 137 mmol/L (ref 135–145)
TCO2: 27 mmol/L (ref 0–100)
TCO2: 26 mmol/L (ref 0–100)
TCO2: 29 mmol/L (ref 0–100)
TCO2: 27 mmol/L (ref 0–100)
TCO2: 28 mmol/L (ref 0–100)
TCO2: 24 mmol/L (ref 0–100)

## 2016-03-19 LAB — POCT I-STAT 3, ART BLOOD GAS (G3+)
ACID-BASE EXCESS: 3 mmol/L — AB (ref 0.0–2.0)
Acid-Base Excess: 1 mmol/L (ref 0.0–2.0)
Acid-Base Excess: 1 mmol/L (ref 0.0–2.0)
Acid-Base Excess: 1 mmol/L (ref 0.0–2.0)
Acid-base deficit: 4 mmol/L — ABNORMAL HIGH (ref 0.0–2.0)
Bicarbonate: 27.4 mmol/L (ref 20.0–28.0)
Bicarbonate: 27 mmol/L (ref 20.0–28.0)
Bicarbonate: 26.9 mmol/L (ref 20.0–28.0)
Bicarbonate: 21.6 mmol/L (ref 20.0–28.0)
Bicarbonate: 27.9 mmol/L (ref 20.0–28.0)
O2 SAT: 100 %
O2 SAT: 97 %
O2 SAT: 96 %
O2 Saturation: 100 %
O2 Saturation: 93 %
PCO2 ART: 48.1 mmHg — AB (ref 32.0–48.0)
PCO2 ART: 48.7 mmHg — AB (ref 32.0–48.0)
PCO2 ART: 45.2 mmHg (ref 32.0–48.0)
PCO2 ART: 45.6 mmHg (ref 32.0–48.0)
PH ART: 7.351 (ref 7.350–7.450)
PH ART: 7.377 (ref 7.350–7.450)
PO2 ART: 88 mmHg (ref 83.0–108.0)
PO2 ART: 85 mmHg (ref 83.0–108.0)
Patient temperature: 35.9
Patient temperature: 37.1
Patient temperature: 37.1
TCO2: 29 mmol/L (ref 0–100)
TCO2: 28 mmol/L (ref 0–100)
TCO2: 28 mmol/L (ref 0–100)
TCO2: 23 mmol/L (ref 0–100)
TCO2: 29 mmol/L (ref 0–100)
pCO2 arterial: 42.5 mmHg (ref 32.0–48.0)
pH, Arterial: 7.363 (ref 7.350–7.450)
pH, Arterial: 7.315 — ABNORMAL LOW (ref 7.350–7.450)
pH, Arterial: 7.396 (ref 7.350–7.450)
pO2, Arterial: 494 mmHg — ABNORMAL HIGH (ref 83.0–108.0)
pO2, Arterial: 419 mmHg — ABNORMAL HIGH (ref 83.0–108.0)
pO2, Arterial: 75 mmHg — ABNORMAL LOW (ref 83.0–108.0)

## 2016-03-19 LAB — CREATININE, SERUM
Creatinine, Ser: 0.93 mg/dL (ref 0.61–1.24)
GFR calc Af Amer: >60 mL/min (ref >60)
GFR calc non Af Amer: >60 mL/min (ref >60)

## 2016-03-19 LAB — HEMOGLOBIN AND HEMATOCRIT, BLOOD
HCT: 27.2 % — ABNORMAL LOW (ref 39.0–52.0)
Hemoglobin: 9.5 g/dL — ABNORMAL LOW (ref 13.0–17.0)

## 2016-03-19 LAB — POCT I-STAT 4, (NA,K, GLUC, HGB,HCT)
Glucose, Bld: 113 mg/dL — ABNORMAL HIGH (ref 65–99)
HCT: 33 % — ABNORMAL LOW (ref 39.0–52.0)
Hemoglobin: 11.2 g/dL — ABNORMAL LOW (ref 13.0–17.0)
POTASSIUM: 4.1 mmol/L (ref 3.5–5.1)
SODIUM: 138 mmol/L (ref 135–145)

## 2016-03-19 LAB — CBC
HCT: 40.7 % (ref 39.0–52.0)
HCT: 33.8 % — ABNORMAL LOW (ref 39.0–52.0)
HCT: 36.6 % — ABNORMAL LOW (ref 39.0–52.0)
HEMOGLOBIN: 11.4 g/dL — AB (ref 13.0–17.0)
Hemoglobin: 14 g/dL (ref 13.0–17.0)
Hemoglobin: 12.3 g/dL — ABNORMAL LOW (ref 13.0–17.0)
MCH: 32.6 pg (ref 26.0–34.0)
MCH: 31.9 pg (ref 26.0–34.0)
MCH: 31.9 pg (ref 26.0–34.0)
MCHC: 34.4 g/dL (ref 30.0–36.0)
MCHC: 33.7 g/dL (ref 30.0–36.0)
MCHC: 33.6 g/dL (ref 30.0–36.0)
MCV: 94.9 fL (ref 78.0–100.0)
MCV: 94.7 fL (ref 78.0–100.0)
MCV: 94.8 fL (ref 78.0–100.0)
PLATELETS: 203 10*3/uL (ref 150–400)
Platelets: 113 10*3/uL — ABNORMAL LOW (ref 150–400)
Platelets: 153 10*3/uL (ref 150–400)
RBC: 4.29 MIL/uL (ref 4.22–5.81)
RBC: 3.57 MIL/uL — AB (ref 4.22–5.81)
RBC: 3.86 MIL/uL — ABNORMAL LOW (ref 4.22–5.81)
RDW: 13 % (ref 11.5–15.5)
RDW: 12.9 % (ref 11.5–15.5)
RDW: 12.9 % (ref 11.5–15.5)
WBC: 8.9 10*3/uL (ref 4.0–10.5)
WBC: 9.1 10*3/uL (ref 4.0–10.5)
WBC: 11.4 10*3/uL — ABNORMAL HIGH (ref 4.0–10.5)

## 2016-03-19 LAB — PLATELET COUNT: Platelets: 128 10*3/uL — ABNORMAL LOW (ref 150–400)

## 2016-03-19 LAB — APTT: APTT: 32 s (ref 24–36)

## 2016-03-19 LAB — MAGNESIUM: Magnesium: 3.2 mg/dL — ABNORMAL HIGH (ref 1.7–2.4)

## 2016-03-19 LAB — HEPARIN LEVEL (UNFRACTIONATED): Heparin Unfractionated: 0.38 IU/mL (ref 0.30–0.70)

## 2016-03-19 LAB — PROTIME-INR
INR: 1.18
PROTHROMBIN TIME: 15.1 s (ref 11.4–15.2)

## 2016-03-19 LAB — MRSA PCR SCREENING: MRSA BY PCR: NEGATIVE

## 2016-03-19 SURGERY — CORONARY ARTERY BYPASS GRAFTING (CABG)
Anesthesia: General | Site: Chest

## 2016-03-19 MED ORDER — LACTATED RINGERS IV SOLN
INTRAVENOUS | Status: DC | PRN
  Administered 2016-03-19 (×3): via INTRAVENOUS

## 2016-03-19 MED ORDER — INSULIN REGULAR BOLUS VIA INFUSION
0.0000 [IU] | Freq: Three times a day (TID) | INTRAVENOUS | Status: DC
  Filled 2016-03-19: qty 10

## 2016-03-19 MED ORDER — ROCURONIUM BROMIDE 10 MG/ML (PF) SYRINGE
PREFILLED_SYRINGE | INTRAVENOUS | Status: DC | PRN
  Administered 2016-03-19 (×4): 50 mg via INTRAVENOUS

## 2016-03-19 MED ORDER — SODIUM CHLORIDE 0.9% FLUSH: 3.0000 mL | INTRAVENOUS | Status: DC | PRN

## 2016-03-19 MED ORDER — MORPHINE SULFATE (PF) 2 MG/ML IV SOLN
1.0000 mg | INTRAVENOUS | Status: AC | PRN
  Administered 2016-03-19: 4 mg via INTRAVENOUS
  Administered 2016-03-19: 2 mg via INTRAVENOUS
  Filled 2016-03-19 (×2): qty 2
  Filled 2016-03-19: qty 1

## 2016-03-19 MED ORDER — FAMOTIDINE IN NACL 20-0.9 MG/50ML-% IV SOLN
20.0000 mg | Freq: Two times a day (BID) | INTRAVENOUS | Status: DC
  Administered 2016-03-19: 20 mg via INTRAVENOUS

## 2016-03-19 MED ORDER — ACETAMINOPHEN 160 MG/5ML PO SOLN: 1000.0000 mg | Freq: Four times a day (QID) | ORAL | Status: DC

## 2016-03-19 MED ORDER — ACETAMINOPHEN 650 MG RE SUPP
650.0000 mg | Freq: Once | RECTAL | Status: AC
  Administered 2016-03-19: 650 mg via RECTAL

## 2016-03-19 MED ORDER — 0.9 % SODIUM CHLORIDE (POUR BTL) OPTIME
TOPICAL | Status: DC | PRN
  Administered 2016-03-19: 6000 mL

## 2016-03-19 MED ORDER — METOPROLOL TARTRATE 25 MG/10 ML ORAL SUSPENSION: 12.5000 mg | Freq: Two times a day (BID) | ORAL | Status: DC

## 2016-03-19 MED ORDER — LACTATED RINGERS IV SOLN: 500.0000 mL | Freq: Once | INTRAVENOUS | Status: DC | PRN

## 2016-03-19 MED ORDER — SODIUM CHLORIDE 0.9 % IV SOLN: 250.0000 mL | INTRAVENOUS | Status: DC

## 2016-03-19 MED ORDER — PANTOPRAZOLE SODIUM 40 MG PO TBEC
40.0000 mg | DELAYED_RELEASE_TABLET | Freq: Every day | ORAL | Status: DC
Start: 2016-03-20 — End: 2016-03-21
  Administered 2016-03-20 – 2016-03-21 (×2): 40 mg via ORAL
  Filled 2016-03-19 (×2): qty 1

## 2016-03-19 MED ORDER — PROPOFOL 10 MG/ML IV BOLUS
INTRAVENOUS | Status: DC | PRN
  Administered 2016-03-19: 60 mg via INTRAVENOUS
  Administered 2016-03-19: 40 mg via INTRAVENOUS

## 2016-03-19 MED ORDER — DEXTROSE 5 % IV SOLN
0.0000 ug/min | INTRAVENOUS | Status: DC
  Filled 2016-03-19: qty 2

## 2016-03-19 MED ORDER — SODIUM CHLORIDE 0.45 % IV SOLN
INTRAVENOUS | Status: DC | PRN
Start: 2016-03-19 — End: 2016-03-21
  Administered 2016-03-19: 14:00:00 via INTRAVENOUS

## 2016-03-19 MED ORDER — MORPHINE SULFATE (PF) 2 MG/ML IV SOLN
2.0000 mg | INTRAVENOUS | Status: DC | PRN
  Administered 2016-03-19: 2 mg via INTRAVENOUS
  Administered 2016-03-19 – 2016-03-21 (×9): 4 mg via INTRAVENOUS
  Administered 2016-03-21: 2 mg via INTRAVENOUS
  Administered 2016-03-21: 4 mg via INTRAVENOUS
  Administered 2016-03-21: 2 mg via INTRAVENOUS
  Filled 2016-03-19 (×2): qty 1
  Filled 2016-03-19 (×2): qty 2
  Filled 2016-03-19: qty 1
  Filled 2016-03-19 (×7): qty 2

## 2016-03-19 MED ORDER — MAGNESIUM SULFATE 4 GM/100ML IV SOLN
4.0000 g | Freq: Once | INTRAVENOUS | Status: AC
  Administered 2016-03-19: 4 g via INTRAVENOUS
  Filled 2016-03-19: qty 100

## 2016-03-19 MED ORDER — BISACODYL 5 MG PO TBEC
10.0000 mg | DELAYED_RELEASE_TABLET | Freq: Every day | ORAL | Status: DC
  Administered 2016-03-20 – 2016-03-21 (×2): 10 mg via ORAL
  Filled 2016-03-19 (×2): qty 2

## 2016-03-19 MED ORDER — DOCUSATE SODIUM 100 MG PO CAPS
200.0000 mg | ORAL_CAPSULE | Freq: Every day | ORAL | Status: DC
  Administered 2016-03-20 – 2016-03-21 (×2): 200 mg via ORAL
  Filled 2016-03-19 (×2): qty 2

## 2016-03-19 MED ORDER — ROCURONIUM BROMIDE 50 MG/5ML IV SOSY
PREFILLED_SYRINGE | INTRAVENOUS | Status: AC
  Filled 2016-03-19: qty 5

## 2016-03-19 MED ORDER — PROPOFOL 10 MG/ML IV BOLUS
INTRAVENOUS | Status: AC
  Filled 2016-03-19: qty 20

## 2016-03-19 MED ORDER — NITROGLYCERIN IN D5W 200-5 MCG/ML-% IV SOLN: 0.0000 ug/min | INTRAVENOUS | Status: DC

## 2016-03-19 MED ORDER — TRAMADOL HCL 50 MG PO TABS
50.0000 mg | ORAL_TABLET | ORAL | Status: DC | PRN
  Administered 2016-03-20: 50 mg via ORAL
  Filled 2016-03-19: qty 1

## 2016-03-19 MED ORDER — MIDAZOLAM HCL 5 MG/5ML IJ SOLN
INTRAMUSCULAR | Status: DC | PRN
  Administered 2016-03-19: 6 mg via INTRAVENOUS
  Administered 2016-03-19 (×2): 2 mg via INTRAVENOUS

## 2016-03-19 MED ORDER — LACTATED RINGERS IV SOLN: INTRAVENOUS | Status: DC

## 2016-03-19 MED ORDER — HEMOSTATIC AGENTS (NO CHARGE) OPTIME
TOPICAL | Status: DC | PRN
  Administered 2016-03-19 (×2): 1 via TOPICAL

## 2016-03-19 MED ORDER — METOPROLOL TARTRATE 12.5 MG HALF TABLET
12.5000 mg | ORAL_TABLET | Freq: Two times a day (BID) | ORAL | Status: DC
  Administered 2016-03-20: 12.5 mg via ORAL
  Filled 2016-03-19: qty 1

## 2016-03-19 MED ORDER — ONDANSETRON HCL 4 MG/2ML IJ SOLN: 4.0000 mg | Freq: Four times a day (QID) | INTRAMUSCULAR | Status: DC | PRN

## 2016-03-19 MED ORDER — LIDOCAINE 2% (20 MG/ML) 5 ML SYRINGE
INTRAMUSCULAR | Status: AC
  Filled 2016-03-19: qty 5

## 2016-03-19 MED ORDER — MIDAZOLAM HCL 2 MG/2ML IJ SOLN
2.0000 mg | INTRAMUSCULAR | Status: DC | PRN
Start: 2016-03-19 — End: 2016-03-21

## 2016-03-19 MED ORDER — VANCOMYCIN HCL IN DEXTROSE 1-5 GM/200ML-% IV SOLN
1000.0000 mg | Freq: Once | INTRAVENOUS | Status: AC
  Administered 2016-03-19: 1000 mg via INTRAVENOUS
  Filled 2016-03-19: qty 200

## 2016-03-19 MED ORDER — FENTANYL CITRATE (PF) 250 MCG/5ML IJ SOLN
INTRAMUSCULAR | Status: DC | PRN
  Administered 2016-03-19: 100 ug via INTRAVENOUS
  Administered 2016-03-19 (×2): 50 ug via INTRAVENOUS
  Administered 2016-03-19: 100 ug via INTRAVENOUS
  Administered 2016-03-19: 50 ug via INTRAVENOUS
  Administered 2016-03-19: 200 ug via INTRAVENOUS
  Administered 2016-03-19 (×4): 50 ug via INTRAVENOUS
  Administered 2016-03-19 (×2): 100 ug via INTRAVENOUS
  Administered 2016-03-19 (×8): 50 ug via INTRAVENOUS

## 2016-03-19 MED ORDER — SODIUM CHLORIDE 0.9 % IV SOLN: INTRAVENOUS | Status: DC

## 2016-03-19 MED ORDER — FENTANYL CITRATE (PF) 250 MCG/5ML IJ SOLN
INTRAMUSCULAR | Status: AC
  Filled 2016-03-19: qty 5

## 2016-03-19 MED ORDER — CHLORHEXIDINE GLUCONATE 0.12 % MT SOLN
15.0000 mL | OROMUCOSAL | Status: AC
  Administered 2016-03-19: 15 mL via OROMUCOSAL

## 2016-03-19 MED ORDER — SODIUM CHLORIDE 0.9 % IV SOLN
30.0000 meq | Freq: Once | INTRAVENOUS | Status: DC
  Filled 2016-03-19: qty 15

## 2016-03-19 MED ORDER — SODIUM CHLORIDE 0.9 % IV SOLN
INTRAVENOUS | Status: DC
  Filled 2016-03-19: qty 2.5

## 2016-03-19 MED ORDER — DEXTROSE 5 % IV SOLN
1.5000 g | Freq: Two times a day (BID) | INTRAVENOUS | Status: AC
  Administered 2016-03-19 – 2016-03-21 (×4): 1.5 g via INTRAVENOUS
  Filled 2016-03-19 (×4): qty 1.5

## 2016-03-19 MED ORDER — ASPIRIN EC 325 MG PO TBEC
325.0000 mg | DELAYED_RELEASE_TABLET | Freq: Every day | ORAL | Status: DC
Start: 2016-03-20 — End: 2016-03-21
  Administered 2016-03-20 – 2016-03-21 (×2): 325 mg via ORAL
  Filled 2016-03-19 (×2): qty 1

## 2016-03-19 MED ORDER — ACETAMINOPHEN 500 MG PO TABS
1000.0000 mg | ORAL_TABLET | Freq: Four times a day (QID) | ORAL | Status: DC
  Administered 2016-03-20 – 2016-03-21 (×6): 1000 mg via ORAL
  Filled 2016-03-19 (×6): qty 2

## 2016-03-19 MED ORDER — METOPROLOL TARTRATE 5 MG/5ML IV SOLN: 2.5000 mg | INTRAVENOUS | Status: DC | PRN

## 2016-03-19 MED ORDER — SODIUM CHLORIDE 0.9% FLUSH: 3.0000 mL | Freq: Two times a day (BID) | INTRAVENOUS | Status: DC

## 2016-03-19 MED ORDER — HEPARIN SODIUM (PORCINE) 1000 UNIT/ML IJ SOLN
INTRAMUSCULAR | Status: DC | PRN
  Administered 2016-03-19: 20000 [IU] via INTRAVENOUS
  Administered 2016-03-19: 5000 [IU] via INTRAVENOUS

## 2016-03-19 MED ORDER — HEPARIN SODIUM (PORCINE) 1000 UNIT/ML IJ SOLN
INTRAMUSCULAR | Status: AC
  Filled 2016-03-19: qty 1

## 2016-03-19 MED ORDER — ALBUMIN HUMAN 5 % IV SOLN
250.0000 mL | INTRAVENOUS | Status: AC | PRN
  Administered 2016-03-19: 250 mL via INTRAVENOUS

## 2016-03-19 MED ORDER — PROTAMINE SULFATE 10 MG/ML IV SOLN
INTRAVENOUS | Status: DC | PRN
  Administered 2016-03-19: 10 mg via INTRAVENOUS
  Administered 2016-03-19: 100 mg via INTRAVENOUS
  Administered 2016-03-19: 90 mg via INTRAVENOUS

## 2016-03-19 MED ORDER — ASPIRIN 81 MG PO CHEW: 324.0000 mg | CHEWABLE_TABLET | Freq: Every day | ORAL | Status: DC

## 2016-03-19 MED ORDER — ACETAMINOPHEN 160 MG/5ML PO SOLN: 650.0000 mg | Freq: Once | ORAL | Status: AC

## 2016-03-19 MED ORDER — MIDAZOLAM HCL 10 MG/2ML IJ SOLN
INTRAMUSCULAR | Status: AC
  Filled 2016-03-19: qty 2

## 2016-03-19 MED ORDER — BISACODYL 10 MG RE SUPP: 10.0000 mg | Freq: Every day | RECTAL | Status: DC

## 2016-03-19 MED ORDER — SODIUM CHLORIDE 0.9 % IJ SOLN
OROMUCOSAL | Status: DC | PRN
  Administered 2016-03-19 (×3): 4 mL via TOPICAL

## 2016-03-19 MED ORDER — SODIUM BICARBONATE 8.4 % IV SOLN
25.0000 meq | Freq: Once | INTRAVENOUS | Status: AC
  Administered 2016-03-19: 25 meq via INTRAVENOUS

## 2016-03-19 MED ORDER — DEXMEDETOMIDINE HCL IN NACL 200 MCG/50ML IV SOLN: 0.0000 ug/kg/h | INTRAVENOUS | Status: DC

## 2016-03-19 MED ORDER — FENTANYL CITRATE (PF) 250 MCG/5ML IJ SOLN
INTRAMUSCULAR | Status: AC
  Filled 2016-03-19: qty 25

## 2016-03-19 MED ORDER — OXYCODONE HCL 5 MG PO TABS
5.0000 mg | ORAL_TABLET | ORAL | Status: DC | PRN
  Administered 2016-03-19 – 2016-03-21 (×5): 10 mg via ORAL
  Administered 2016-03-21: 5 mg via ORAL
  Administered 2016-03-21: 10 mg via ORAL
  Filled 2016-03-19 (×7): qty 2

## 2016-03-19 MED FILL — Sodium Bicarbonate IV Soln 8.4%: INTRAVENOUS | Qty: 50 | Status: AC

## 2016-03-19 MED FILL — Magnesium Sulfate Inj 50%: INTRAMUSCULAR | Qty: 10 | Status: AC

## 2016-03-19 MED FILL — Sodium Chloride IV Soln 0.9%: INTRAVENOUS | Qty: 2000 | Status: AC

## 2016-03-19 MED FILL — Heparin Sodium (Porcine) Inj 1000 Unit/ML: INTRAMUSCULAR | Qty: 30 | Status: AC

## 2016-03-19 MED FILL — Potassium Chloride Inj 2 mEq/ML: INTRAVENOUS | Qty: 40 | Status: AC

## 2016-03-19 MED FILL — Lidocaine HCl IV Inj 20 MG/ML: INTRAVENOUS | Qty: 5 | Status: AC

## 2016-03-19 MED FILL — Heparin Sodium (Porcine) Inj 1000 Unit/ML: INTRAMUSCULAR | Qty: 20 | Status: AC

## 2016-03-19 MED FILL — Mannitol IV Soln 20%: INTRAVENOUS | Qty: 500 | Status: AC

## 2016-03-19 MED FILL — Electrolyte-R (PH 7.4) Solution: INTRAVENOUS | Qty: 4000 | Status: AC

## 2016-03-19 SURGICAL SUPPLY — 66 items
AGENT HMST KT MTR STRL THRMB (HEMOSTASIS) ×2
BAG DECANTER FOR FLEXI CONT (MISCELLANEOUS) ×3 IMPLANT
BANDAGE ACE 4X5 VEL STRL LF (GAUZE/BANDAGES/DRESSINGS) ×3 IMPLANT
BANDAGE ACE 6X5 VEL STRL LF (GAUZE/BANDAGES/DRESSINGS) ×3 IMPLANT
BLADE STERNUM SYSTEM 6 (BLADE) ×3 IMPLANT
BNDG GAUZE ELAST 4 BULKY (GAUZE/BANDAGES/DRESSINGS) ×3 IMPLANT
CANISTER SUCTION 2500CC (MISCELLANEOUS) ×3 IMPLANT
CATH CPB KIT GERHARDT (MISCELLANEOUS) ×3 IMPLANT
CATH THORACIC 28FR (CATHETERS) ×3 IMPLANT
CLIP FOGARTY SPRING 6M (CLIP) ×1 IMPLANT
CRADLE DONUT ADULT HEAD (MISCELLANEOUS) ×3 IMPLANT
DRAIN CHANNEL 28F RND 3/8 FF (WOUND CARE) ×3 IMPLANT
DRAPE CARDIOVASCULAR INCISE (DRAPES) ×3
DRAPE SLUSH/WARMER DISC (DRAPES) ×3 IMPLANT
DRAPE SRG 135X102X78XABS (DRAPES) ×2 IMPLANT
DRSG AQUACEL AG ADV 3.5X14 (GAUZE/BANDAGES/DRESSINGS) ×3 IMPLANT
ELECT BLADE 4.0 EZ CLEAN MEGAD (MISCELLANEOUS) ×3
ELECT REM PT RETURN 9FT ADLT (ELECTROSURGICAL) ×6
ELECTRODE BLDE 4.0 EZ CLN MEGD (MISCELLANEOUS) ×2 IMPLANT
ELECTRODE REM PT RTRN 9FT ADLT (ELECTROSURGICAL) ×4 IMPLANT
FELT TEFLON 1X6 (MISCELLANEOUS) ×5 IMPLANT
GAUZE SPONGE 4X4 12PLY STRL (GAUZE/BANDAGES/DRESSINGS) ×6 IMPLANT
GLOVE BIO SURGEON STRL SZ 6.5 (GLOVE) ×9 IMPLANT
GLOVE BIO SURGEON STRL SZ7.5 (GLOVE) ×1 IMPLANT
GOWN STRL REUS W/ TWL LRG LVL3 (GOWN DISPOSABLE) ×8 IMPLANT
GOWN STRL REUS W/TWL LRG LVL3 (GOWN DISPOSABLE) ×24
HEMOSTAT POWDER SURGIFOAM 1G (HEMOSTASIS) ×9 IMPLANT
HEMOSTAT SURGICEL 2X14 (HEMOSTASIS) ×3 IMPLANT
KIT BASIN OR (CUSTOM PROCEDURE TRAY) ×3 IMPLANT
KIT CATH SUCT 8FR (CATHETERS) ×3 IMPLANT
KIT ROOM TURNOVER OR (KITS) ×3 IMPLANT
KIT SUCTION CATH 14FR (SUCTIONS) ×6 IMPLANT
KIT VASOVIEW HEMOPRO VH 3000 (KITS) ×3 IMPLANT
LEAD PACING MYOCARDI (MISCELLANEOUS) ×3 IMPLANT
MARKER GRAFT CORONARY BYPASS (MISCELLANEOUS) ×9 IMPLANT
NS IRRIG 1000ML POUR BTL (IV SOLUTION) ×16 IMPLANT
PACK OPEN HEART (CUSTOM PROCEDURE TRAY) ×3 IMPLANT
PAD ARMBOARD 7.5X6 YLW CONV (MISCELLANEOUS) ×6 IMPLANT
PAD CARDIAC INSULATION (MISCELLANEOUS) ×1 IMPLANT
PAD ELECT DEFIB RADIOL ZOLL (MISCELLANEOUS) ×3 IMPLANT
PENCIL BUTTON HOLSTER BLD 10FT (ELECTRODE) ×3 IMPLANT
PUNCH AORTIC ROTATE  4.5MM 8IN (MISCELLANEOUS) ×1 IMPLANT
SENSOR MYOCARDIAL TEMP (MISCELLANEOUS) ×1 IMPLANT
SET CARDIOPLEGIA MPS 5001102 (MISCELLANEOUS) ×1 IMPLANT
SURGIFLO W/THROMBIN 8M KIT (HEMOSTASIS) ×1 IMPLANT
SUT BONE WAX W31G (SUTURE) ×3 IMPLANT
SUT PROLENE 3 0 SH1 36 (SUTURE) ×3 IMPLANT
SUT PROLENE 4 0 TF (SUTURE) ×6 IMPLANT
SUT PROLENE 6 0 C 1 30 (SUTURE) ×2 IMPLANT
SUT PROLENE 6 0 CC (SUTURE) ×9 IMPLANT
SUT PROLENE 7 0 BV1 MDA (SUTURE) ×4 IMPLANT
SUT PROLENE 8 0 BV175 6 (SUTURE) ×4 IMPLANT
SUT STEEL 6MS V (SUTURE) ×3 IMPLANT
SUT STEEL SZ 6 DBL 3X14 BALL (SUTURE) ×3 IMPLANT
SUT VIC AB 1 CTX 18 (SUTURE) ×6 IMPLANT
SUT VIC AB 2-0 CT1 27 (SUTURE) ×3
SUT VIC AB 2-0 CT1 TAPERPNT 27 (SUTURE) IMPLANT
SUT VIC AB 3-0 X1 27 (SUTURE) ×1 IMPLANT
SUTURE E-PAK OPEN HEART (SUTURE) ×3 IMPLANT
SYSTEM SAHARA CHEST DRAIN ATS (WOUND CARE) ×3 IMPLANT
TOWEL OR 17X24 6PK STRL BLUE (TOWEL DISPOSABLE) ×5 IMPLANT
TOWEL OR 17X26 10 PK STRL BLUE (TOWEL DISPOSABLE) ×5 IMPLANT
TRAY FOLEY IC TEMP SENS 16FR (CATHETERS) ×3 IMPLANT
TUBING INSUFFLATION (TUBING) ×3 IMPLANT
UNDERPAD 30X30 (UNDERPADS AND DIAPERS) ×3 IMPLANT
WATER STERILE IRR 1000ML POUR (IV SOLUTION) ×6 IMPLANT

## 2016-03-19 NOTE — Anesthesia Procedure Notes (Signed)
Central Venous Catheter Insertion Performed by: Kipp BroodJOSLIN, Daryle Boyington, anesthesiologist Start/End1/10/2016 7:00 AM, 03/19/2016 7:10 AM Patient location: Pre-op. Preanesthetic checklist: patient identified, IV checked, site marked, risks and benefits discussed, surgical consent, monitors and equipment checked, pre-op evaluation, timeout performed and anesthesia consent Position: supine Lidocaine 1% used for infiltration Hand hygiene performed  and maximum sterile barriers used  Catheter size: 9 Fr Total catheter length 45. PA cath was placed.Swan type:thermodilution Procedure performed using ultrasound guided technique. Ultrasound Notes:anatomy identified, needle tip was noted to be adjacent to the nerve/plexus identified, no ultrasound evidence of intravascular and/or intraneural injection and image(s) printed for medical record Attempts: 1 Following insertion, line sutured and dressing applied. Post procedure assessment: blood return through all ports, free fluid flow and no air  Patient tolerated the procedure well with no immediate complications.

## 2016-03-19 NOTE — Anesthesia Procedure Notes (Signed)
Procedure Name: Intubation Date/Time: 03/19/2016 7:46 AM Performed by: Lance Coon Pre-anesthesia Checklist: Patient identified, Emergency Drugs available, Suction available, Patient being monitored and Timeout performed Patient Re-evaluated:Patient Re-evaluated prior to inductionOxygen Delivery Method: Circle system utilized Preoxygenation: Pre-oxygenation with 100% oxygen Intubation Type: IV induction Ventilation: Mask ventilation without difficulty and Oral airway inserted - appropriate to patient size Laryngoscope Size: Mac and 3 Grade View: Grade I Tube size: 8.0 mm Number of attempts: 2 Airway Equipment and Method: Stylet Placement Confirmation: ETT inserted through vocal cords under direct vision,  positive ETCO2 and breath sounds checked- equal and bilateral Secured at: 22 cm Tube secured with: Tape Dental Injury: Teeth and Oropharynx as per pre-operative assessment  Comments: Airway per Yvonne Kendall SRNA

## 2016-03-19 NOTE — Procedures (Signed)
Extubation Procedure Note  Patient Details:   Name: Douglas EndsJonathan [name] DOB: February 01, 1966 MRN: [id number]   Airway Documentation: Pt able to follow all commands, NIF -60 cmh20, VC 2.3 L/min.  Extubated to 4 lpm Dillard sat 99%.  Pt able to speak and say name, where he was.  IS instruction given pt able to do 10 x 1500cc.  Good strong cough.    Evaluation  O2 sats: stable throughout Complications: No apparent complications Patient did tolerate procedure well. Bilateral Breath Sounds: Clear   Yes  Richmond CampbellHall, Adam Sanjuan Lynn 03/19/2016, 5:33 PM

## 2016-03-19 NOTE — Progress Notes (Signed)
      [address]       [place] [id number]             [phone]      Extubated  BP (!) 157/115   Pulse (!) 104   Temp 98.2 F (36.8 C)   Resp 17   Ht 5\' 11"  (1.803 m)   Wt 147 lb 14.4 oz (67.1 kg) Comment: scale c  SpO2 99%   BMI 20.63 kg/m   Ci= 2.8   Intake/Output Summary (Last 24 hours) at 03/19/16 1803 Last data filed at 03/19/16 1700  Gross per 24 hour  Intake           3701.1 ml  Output             1985 ml  Net           1716.1 ml   CBG well controlled with drip  6 hour labs pending  Viviann SpareSteven C. Dorris FetchHendrickson, MD [facility] [phone]

## 2016-03-19 NOTE — Anesthesia Postprocedure Evaluation (Addendum)
Anesthesia Post Note  Patient: Douglas [name]  Procedure(s) Performed: Procedure(s) (LRB): CORONARY ARTERY BYPASS GRAFTING (CABG), ON PUMP, TIMES FIVE, USING LEFT INTERNAL MAMMARY ARTERY AND RIGHT GREATER SAPHENOUS VEIN HARVESTED ENDOSCOPICALLY (N/A) TRANSESOPHAGEAL ECHOCARDIOGRAM (TEE) (N/A)  Patient location during evaluation: SICU Anesthesia Type: General Level of consciousness: sedated and patient remains intubated per anesthesia plan Pain management: pain level controlled Vital Signs Assessment: post-procedure vital signs reviewed and stable Respiratory status: patient on ventilator - see flowsheet for VS and patient remains intubated per anesthesia plan Cardiovascular status: blood pressure returned to baseline Anesthetic complications: no       Last Vitals:  Vitals:   03/19/16 1600 03/19/16 1615  BP: 105/76   Pulse: 85 85  Resp: 12 13  Temp: 36.5 C 36.7 C    Last Pain:  Vitals:   03/19/16 0502  TempSrc: Oral  PainSc:                  Douglas [name]

## 2016-03-19 NOTE — Anesthesia Preprocedure Evaluation (Signed)
Anesthesia Evaluation  Patient identified by MRN, date of birth, ID band Patient awake    Reviewed: Allergy & Precautions, NPO status , Patient's Chart, lab work & pertinent test results  Airway Mallampati: II  TM Distance: >3 FB Neck ROM: Full    Dental  (+) Teeth Intact, Poor Dentition   Pulmonary Current Smoker,    breath sounds clear to auscultation       Cardiovascular  Rhythm:Regular Rate:Normal     Neuro/Psych    GI/Hepatic   Endo/Other    Renal/GU      Musculoskeletal   Abdominal   Peds  Hematology   Anesthesia Other Findings   Reproductive/Obstetrics                             Anesthesia Physical Anesthesia Plan  ASA: III  Anesthesia Plan: General   Post-op Pain Management:    Induction: Intravenous  Airway Management Planned: Oral ETT  Additional Equipment: Arterial line, PA Cath and 3D TEE  Intra-op Plan:   Post-operative Plan: Post-operative intubation/ventilation  Informed Consent: I have reviewed the patients History and Physical, chart, labs and discussed the procedure including the risks, benefits and alternatives for the proposed anesthesia with the patient or authorized representative who has indicated his/her understanding and acceptance.   Dental advisory given  Plan Discussed with: CRNA and Anesthesiologist  Anesthesia Plan Comments:         Anesthesia Quick Evaluation

## 2016-03-19 NOTE — Interval H&P Note (Signed)
Preop Interval Note  Interval Note:  03/19/2016 7:10 AM  Douglas EndsJonathan Wildrick  has presented today for surgery, with the diagnosis of CAD  The various methods of treatment have been discussed with the patient . After consideration of risks, benefits and other options for treatment, the patient has consented to  Procedure(s): CORONARY ARTERY BYPASS GRAFTING (CABG) (N/A) TRANSESOPHAGEAL ECHOCARDIOGRAM (TEE) (N/A) as a surgical intervention .  The patient's history has been reviewed, patient examined, no change in status, stable for surgery.  I have reviewed the patient's chart and labs.  Questions were answered to the patient's satisfaction.   Lab Results  Component Value Date   WBC 8.9 03/19/2016   HGB 14.0 03/19/2016   HCT 40.7 03/19/2016   PLT 203 03/19/2016   GLUCOSE 111 (H) 03/19/2016   CHOL 153 03/15/2016   TRIG 70 03/15/2016   HDL 50 03/15/2016   LDLCALC 89 03/15/2016   ALT 22 03/18/2016   AST 27 03/18/2016   NA 135 03/19/2016   K 4.1 03/19/2016   CL 99 (L) 03/19/2016   CREATININE 1.28 (H) 03/19/2016   BUN 11 03/19/2016   CO2 28 03/19/2016   TSH 4.546 (H) 03/14/2016   INR 0.99 03/14/2016   HGBA1C 5.3 03/14/2016    Delight OvensEdward B Diany Formosa MD      [address] [place] [id number] Office [phone]   Beeper [phone]

## 2016-03-19 NOTE — OR Nursing (Signed)
Twenty minute call to SICU charge nurse at 1254. Spoke to MantonNicole.

## 2016-03-19 NOTE — Progress Notes (Signed)
  Echocardiogram Echocardiogram Transesophageal has been performed.  Douglas [name] 03/19/2016, 8:39 AM

## 2016-03-19 NOTE — OR Nursing (Signed)
Forty-five minute call to SICU charge nurse at 1224. Spoke to RushmoreNicole.

## 2016-03-19 NOTE — H&P (View-Only) (Signed)
[address]       [place] [address]             [phone]                   Procedure(s) (LRB): CORONARY ARTERY BYPASS GRAFTING (CABG) (N/A) TRANSESOPHAGEAL ECHOCARDIOGRAM (TEE) (N/A)  LOS: 2 days   Subjective: Patient had ct of abdomen last pm. With no evidence of pancreatic cancer plan to proceed with cabg monday  Objective: Vital signs in last 24 hours: Patient Vitals for the past 24 hrs:  BP Temp Temp src Pulse Resp SpO2 Weight  03/16/16 1133 127/78 98.4 F (36.9 C) Oral 72 18 94 % -  03/16/16 1022 (!) 148/78 - - 82 - - -  03/16/16 0417 128/82 97.1 F (36.2 C) Oral 76 20 96 % 149 lb 11.2 oz (67.9 kg)  03/16/16 0013 (!) 143/70 97.7 F (36.5 C) Oral 76 20 99 % -  03/15/16 1952 127/84 97.8 F (36.6 C) Oral 89 20 99 % -  03/15/16 1553 133/88 98.1 F (36.7 C) Oral 88 18 100 % -    Filed Weights   03/14/16 0330 03/15/16 0400 03/16/16 0417  Weight: 160 lb 0.9 oz (72.6 kg) 151 lb 12.8 oz (68.9 kg) 149 lb 11.2 oz (67.9 kg)    Hemodynamic parameters for last 24 hours:    Intake/Output from previous day: 01/04 0701 - 01/05 0700 In: 1790.4 [P.O.:1255; I.V.:535.4] Out: 300 [Urine:300] Intake/Output this shift: Total I/O In: 560 [P.O.:360; I.V.:200] Out: -   Scheduled Meds: . aspirin EC  81 mg Oral Daily  . atorvastatin  80 mg Oral q1800  . metoprolol tartrate  25 mg Oral BID  .  morphine injection  2 mg Intravenous Once  . nicotine  14 mg Transdermal Daily  . sodium chloride flush  3 mL Intravenous Q12H   Continuous Infusions: . heparin 1,150 Units/hr (03/15/16 2311)  . nitroGLYCERIN 45 mcg/min (03/16/16 1542)   PRN Meds:.sodium chloride, acetaminophen, morphine injection, nitroGLYCERIN, nitroGLYCERIN, ondansetron (ZOFRAN) IV, oxyCODONE-acetaminophen, sodium chloride flush  General appearance: alert and cooperative Neurologic: intact Heart: regular rate and rhythm, S1, S2 normal, no murmur, click, rub or gallop Lungs: clear to  auscultation bilaterally Abdomen: soft, non-tender; bowel sounds normal; no masses,  no organomegaly Extremities: extremities normal, atraumatic, no cyanosis or edema and Homans sign is negative, no sign of DVT  Lab Results: CBC: Recent Labs  03/15/16 0633 03/16/16 0420  WBC 15.4* 11.9*  HGB 13.9 14.0  HCT 42.3 41.4  PLT 204 219   BMET:  Recent Labs  03/14/16 0630 03/15/16 0633  NA 136 133*  K 4.3 4.5  CL 106 103  CO2 24 24  GLUCOSE 111* 142*  BUN 6 13  CREATININE 1.00 1.01  CALCIUM 8.2* 8.2*    PT/INR:  Recent Labs  03/14/16 0630  LABPROT 13.1  INR 0.99     Radiology Ct Abdomen W Contrast  Result Date: 03/16/2016 CLINICAL DATA:  History of pancreatitis.  Possible pancreatic mass. EXAM: CT ABDOMEN WITH CONTRAST TECHNIQUE: Multidetector CT imaging of the abdomen was performed using the standard protocol following bolus administration of intravenous contrast. CONTRAST:  ISOVUE-300 IOPAMIDOL (ISOVUE-300) INJECTION 61% COMPARISON:  CT scan 06/17/2015 and MRI 04/16/2015 FINDINGS: Lower chest: Emphysematous changes noted and minimal bibasilar atelectasis. No infiltrates or effusions. No worrisome pulmonary lesions. The heart is normal in size. No pericardial effusion. Coronary artery calcifications are noted. The distal esophagus  is grossly normal. Hepatobiliary: Stable mild intrahepatic biliary dilatation and mild dilatation of the common bile duct. No common bile duct stones. The gallbladder appears normal. No focal hepatic lesions. Pancreas: Some persistent low-attenuation and strandy ill-defined density between the pancreatic head in the duodenum. 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 This was much more significant on the CT scan from 03/05/2015. No findings for acute pancreatitis. No pancreatic ductal dilatation. Spleen: Normal size.  No focal lesions. Adrenals/Urinary Tract: The adrenal glands and kidneys are unremarkable and stable. No renal or ureteral  calculi. No bladder calculi. No renal or bladder mass. Stomach/Bowel: PE stomach, duodenum, small bowel and colon are unremarkable. No inflammatory changes, mass lesions or obstructive findings. The terminal ileum is normal. The appendix is normal. Vascular/Lymphatic: Stable moderate atherosclerotic calcifications involving the iliac arteries. Minimal scattered aortic calcifications. No aneurysm or dissection. The branch vessels are patent. The major venous structures are patent. Small scattered mesenteric and retroperitoneal lymph nodes are stable. No mass or overt adenopathy. Other: The prostate gland and seminal vesicles are unremarkable. No pelvic mass or adenopathy. No free pelvic fluid collections. No inguinal mass or adenopathy. Musculoskeletal: No significant bony findings. Incidental unilateral pars defect on the left at L5. IMPRESSION: 1. 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 Chronic inflammation between the pancreatic head and duodenum but overall much improved since prior studies. No pancreatic mass is identified. 2. Stable mild chronic intra and extrahepatic biliary dilatation likely due to distal common bile duct stricture/narrowing due to chronic pancreatitis. 3. Age advanced atherosclerotic calcifications involving the iliac arteries. Electronically Signed   By: Rudie MeyerP.  Gallerani M.D.   On: 03/16/2016 07:56     Assessment/Plan: Plan:  CORONARY ARTERY BYPASS GRAFTING (CABG) (N/A) TRANSESOPHAGEAL ECHOCARDIOGRAM (TEE) (N/A)  Discussed with patient proceeding with coronary artery bypass grafting. They are schedule has been changed to accommodate him on Monday. I discussed the risks and options with the patient in detail. 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  The goals  risks and alternatives of the planned surgical procedure cabg  have been discussed with the patient in detail. The risks of the procedure including death, infection, stroke, myocardial infarction, bleeding, blood transfusion have all been discussed specifically.  I have quoted Minerva EndsJonathan Crumrine a 3 % of perioperative mortality and a complication rate as high as 40 %. The patient's questions have been answered.Minerva EndsJonathan Hoose is willing  to proceed with the planned procedure.  Preop Orders have been placed   Delight OvensEdward B Mana Morison MD 03/16/2016 3:51 PM

## 2016-03-19 NOTE — Brief Op Note (Signed)
03/14/2016 - 03/19/2016  11:32 AM  PATIENT:  Douglas [name]  51 y.o. male  PRE-OPERATIVE DIAGNOSIS:  CAD  POST-OPERATIVE DIAGNOSIS:  CAD  PROCEDURE:  Procedure(s): CORONARY ARTERY BYPASS GRAFTING (CABG), ON PUMP, TIMES FIVE, USING LEFT INTERNAL MAMMARY ARTERY AND RIGHT GREATER SAPHENOUS VEIN HARVESTED ENDOSCOPICALLY (N/A) TRANSESOPHAGEAL ECHOCARDIOGRAM (TEE) (N/A) LIMA-LAD SVG-OM SVG-DIAG SEQ SVG-PD-PL  SURGEON:  Surgeon(s) and Role:    * Delight OvensEdward B Gerhardt, MD - Primary  PHYSICIAN ASSISTANT: WAYNE GOLD PA-C  ANESTHESIA:   general  EBL:  Total I/O In: 1000 [I.V.:1000] Out: 75 [Urine:75]  BLOOD ADMINISTERED:none  LOCAL MEDICATIONS USED:  NONE  DRAINS:  3 Chest Tube(s) in the MEDIASINUM/HEMITHORAX  SPECIMEN: NONE  DISPOSITION OF SPECIMEN:  N/A  COUNTS:  YES  TOURNIQUET:  * No tourniquets in log *  DICTATION: .Other Dictation: Dictation Number PENDING  PLAN OF CARE: Admit to inpatient   PATIENT DISPOSITION:  ICU - intubated and hemodynamically stable.   Delay start of Pharmacological VTE agent (>24hrs) due to surgical blood loss or risk of bleeding: yes  COMPLICATIONS: NO KNOWN

## 2016-03-20 ENCOUNTER — Encounter (HOSPITAL_COMMUNITY): Payer: Self-pay | Admitting: Cardiothoracic Surgery

## 2016-03-20 ENCOUNTER — Inpatient Hospital Stay (HOSPITAL_COMMUNITY): Payer: Self-pay

## 2016-03-20 LAB — BASIC METABOLIC PANEL
Anion gap: 5 (ref 5–15)
BUN: 8 mg/dL (ref 6–20)
CALCIUM: 7.6 mg/dL — AB (ref 8.9–10.3)
CHLORIDE: 102 mmol/L (ref 101–111)
CO2: 27 mmol/L (ref 22–32)
CREATININE: 1 mg/dL (ref 0.61–1.24)
GFR calc non Af Amer: >60 mL/min (ref >60)
Glucose, Bld: 120 mg/dL — ABNORMAL HIGH (ref 65–99)
Potassium: 4.3 mmol/L (ref 3.5–5.1)
SODIUM: 134 mmol/L — AB (ref 135–145)

## 2016-03-20 LAB — CBC
HCT: 34.9 % — ABNORMAL LOW (ref 39.0–52.0)
HEMATOCRIT: 35.7 % — AB (ref 39.0–52.0)
HEMOGLOBIN: 11.9 g/dL — AB (ref 13.0–17.0)
HEMOGLOBIN: 11.8 g/dL — AB (ref 13.0–17.0)
MCH: 31.8 pg (ref 26.0–34.0)
MCH: 32.2 pg (ref 26.0–34.0)
MCHC: 33.3 g/dL (ref 30.0–36.0)
MCHC: 33.8 g/dL (ref 30.0–36.0)
MCV: 95.5 fL (ref 78.0–100.0)
MCV: 95.1 fL (ref 78.0–100.0)
Platelets: 146 10*3/uL — ABNORMAL LOW (ref 150–400)
Platelets: 142 10*3/uL — ABNORMAL LOW (ref 150–400)
RBC: 3.74 MIL/uL — AB (ref 4.22–5.81)
RBC: 3.67 MIL/uL — ABNORMAL LOW (ref 4.22–5.81)
RDW: 13 % (ref 11.5–15.5)
RDW: 12.9 % (ref 11.5–15.5)
WBC: 11.1 10*3/uL — ABNORMAL HIGH (ref 4.0–10.5)
WBC: 10.9 10*3/uL — ABNORMAL HIGH (ref 4.0–10.5)

## 2016-03-20 LAB — POCT I-STAT, CHEM 8
BUN: 11 mg/dL (ref 6–20)
CALCIUM ION: 1.15 mmol/L (ref 1.15–1.40)
Chloride: 96 mmol/L — ABNORMAL LOW (ref 101–111)
Creatinine, Ser: 1 mg/dL (ref 0.61–1.24)
Glucose, Bld: 113 mg/dL — ABNORMAL HIGH (ref 65–99)
HCT: 35 % — ABNORMAL LOW (ref 39.0–52.0)
HEMOGLOBIN: 11.9 g/dL — AB (ref 13.0–17.0)
Potassium: 4 mmol/L (ref 3.5–5.1)
SODIUM: 133 mmol/L — AB (ref 135–145)
TCO2: 27 mmol/L (ref 0–100)

## 2016-03-20 LAB — GLUCOSE, CAPILLARY
GLUCOSE-CAPILLARY: 125 mg/dL — AB (ref 65–99)
GLUCOSE-CAPILLARY: 106 mg/dL — AB (ref 65–99)
GLUCOSE-CAPILLARY: 142 mg/dL — AB (ref 65–99)
Glucose-Capillary: 110 mg/dL — ABNORMAL HIGH (ref 65–99)
Glucose-Capillary: 117 mg/dL — ABNORMAL HIGH (ref 65–99)
Glucose-Capillary: 117 mg/dL — ABNORMAL HIGH (ref 65–99)

## 2016-03-20 LAB — MAGNESIUM
MAGNESIUM: 2.7 mg/dL — AB (ref 1.7–2.4)
MAGNESIUM: 2.3 mg/dL (ref 1.7–2.4)

## 2016-03-20 LAB — CREATININE, SERUM
CREATININE: 0.93 mg/dL (ref 0.61–1.24)
GFR calc Af Amer: >60 mL/min (ref >60)

## 2016-03-20 MED ORDER — FUROSEMIDE 20 MG PO TABS
20.0000 mg | ORAL_TABLET | Freq: Once | ORAL | Status: AC
  Administered 2016-03-20: 20 mg via ORAL
  Filled 2016-03-20: qty 1

## 2016-03-20 MED ORDER — KETOROLAC TROMETHAMINE 15 MG/ML IJ SOLN
15.0000 mg | Freq: Four times a day (QID) | INTRAMUSCULAR | Status: DC | PRN
  Administered 2016-03-20 – 2016-03-21 (×4): 15 mg via INTRAVENOUS
  Filled 2016-03-20 (×4): qty 1

## 2016-03-20 MED ORDER — ENOXAPARIN SODIUM 40 MG/0.4ML ~~LOC~~ SOLN
40.0000 mg | Freq: Every day | SUBCUTANEOUS | Status: DC
  Administered 2016-03-20 – 2016-03-26 (×7): 40 mg via SUBCUTANEOUS
  Filled 2016-03-20 (×7): qty 0.4

## 2016-03-20 MED ORDER — INSULIN ASPART 100 UNIT/ML ~~LOC~~ SOLN
0.0000 [IU] | SUBCUTANEOUS | Status: DC
  Administered 2016-03-20: 2 [IU] via SUBCUTANEOUS

## 2016-03-20 MED ORDER — INSULIN ASPART 100 UNIT/ML ~~LOC~~ SOLN
0.0000 [IU] | SUBCUTANEOUS | Status: DC
  Administered 2016-03-20 – 2016-03-21 (×2): 2 [IU] via SUBCUTANEOUS

## 2016-03-20 NOTE — Care Management Note (Signed)
Case Management Note Initial Note Created by Jiles CrockerBrenda Chandler 03/15/16  Patient Details  Name: Douglas EndsJonathan [name] MRN: [id number] Date of Birth: Jul 22, 1965  Subjective/Objective:    Admitted with NSTEMI                Action/Plan: Patient lives with a friend, no PCP, no medical insurance; Patient stated " I was scheduled to follow up with a heart doctor but I was stupid and didn't." CM talked to patient at length and he is agreeable to make to make lifestyle changes to live a long healthy life. [facility]; he can be followed by their IT traineroc Worker / eligibility program for further assistance; he works 20 - 30 hrs at [name]; CM will continue to follow for DCP.  Expected Discharge Date:    TBD              Expected Discharge Plan:  Home/Self Care  In-House Referral:   Financial Counselor  Discharge planning Services  CM Consult    Status of Service:  In process, will continue to follow   03/20/2016 Pt is now 1 day [name]/p CABG.  CM spoke with pt directly - pt alert and oriented.  Pt confirmed he will not have anyone to be with him post discharge as recommended post surgery - pt is in agreement for SNF at discharge.  CSW consulted.  In house financial counselor consulted and following Douglas BoucheClaxton, Douglas Reim S, RN,MHA,BSN [phone] 03/20/2016, 10:18 AM

## 2016-03-20 NOTE — Progress Notes (Signed)
Patient ID: Douglas EndsJonathan [name], male   DOB: [birth year], 51 y.o.   MRN: [id number] EVENING ROUNDS NOTE :     [address]       [name] [id number]             [phone]                 1 Day Post-Op Procedure(s) (LRB): CORONARY ARTERY BYPASS GRAFTING (CABG), ON PUMP, TIMES FIVE, USING LEFT INTERNAL MAMMARY ARTERY AND RIGHT GREATER SAPHENOUS VEIN HARVESTED ENDOSCOPICALLY (N/A) TRANSESOPHAGEAL ECHOCARDIOGRAM (TEE) (N/A)  Total Length of Stay:  LOS: 6 days  BP 122/78   Pulse (!) 104   Temp 98.9 F (37.2 C) (Oral)   Resp 14   Ht 5\' 11"  (1.803 m)   Wt 160 lb 15 oz (73 kg)   SpO2 90%   BMI 22.45 kg/m   .Intake/Output      01/08 0701 - 01/09 0700 01/09 0701 - 01/10 0700   P.O. 360    I.V. (mL/kg) 3585 (49.1) 4.6 (0.1)   Blood 385    IV Piggyback 700    Total Intake(mL/kg) 5030 (68.9) 4.6 (0.1)   Urine (mL/kg/hr) 1615 (0.9) 375 (0.4)   Emesis/NG output     Stool     Blood 750 (0.4)    Chest Tube 310 (0.2)    Total Output 2675 375   Net +2355 -370.5          . sodium chloride 20 mL/hr at 03/20/16 0700  . sodium chloride    . sodium chloride Stopped (03/20/16 0730)  . dexmedetomidine Stopped (03/20/16 0730)  . lactated ringers 20 mL/hr at 03/20/16 0700  . lactated ringers Stopped (03/20/16 0730)  . nitroGLYCERIN Stopped (03/19/16 1810)  . phenylephrine (NEO-SYNEPHRINE) Adult infusion Stopped (03/20/16 0800)     Lab Results  Component Value Date   WBC 10.9 (H) 03/20/2016   HGB 11.8 (L) 03/20/2016   HCT 34.9 (L) 03/20/2016   PLT 142 (L) 03/20/2016   GLUCOSE 120 (H) 03/20/2016   CHOL 153 03/15/2016   TRIG 70 03/15/2016   HDL 50 03/15/2016   LDLCALC 89 03/15/2016   ALT 22 03/18/2016   AST 27 03/18/2016   NA 134 (L) 03/20/2016   K 4.3 03/20/2016   CL 102 03/20/2016   CREATININE 0.93 03/20/2016   BUN 8 03/20/2016   CO2 27 03/20/2016   TSH 4.546 (H) 03/14/2016   INR 1.18 03/19/2016   HGBA1C 5.3 03/14/2016   Stable day uop marginally low -  monitor foley still in  [place]   Delight OvensEdward B Lilliann Rossetti MD  Beeper [phone] Office [phone] 03/20/2016 6:33 PM

## 2016-03-20 NOTE — Transfer of Care (Signed)
Immediate Anesthesia Transfer of Care Note  Patient: Minerva EndsJonathan Riehl  Procedure(s) Performed: Procedure(s) with comments: CORONARY ARTERY BYPASS GRAFTING (CABG), ON PUMP, TIMES FIVE, USING LEFT INTERNAL MAMMARY ARTERY AND RIGHT GREATER SAPHENOUS VEIN HARVESTED ENDOSCOPICALLY (N/A) - LIMA-LAD SVG-OM SVG-DIAG SEQ SVG-PD-PL TRANSESOPHAGEAL ECHOCARDIOGRAM (TEE) (N/A)  Patient Location: SICU  Anesthesia Type:General  Level of Consciousness: sedated and Patient remains intubated per anesthesia plan  Airway & Oxygen Therapy: Patient remains intubated per anesthesia plan  Post-op Assessment: Report given to RN and Post -op Vital signs reviewed and stable  Post vital signs: Reviewed and stable  Last Vitals:  Vitals:   03/20/16 0645 03/20/16 0700  BP: 93/72 98/74  Pulse: 88 88  Resp: 13 18  Temp: 37.6 C 37.5 C    Last Pain:  Vitals:   03/20/16 0428  TempSrc:   PainSc: Asleep      Patients Stated Pain Goal: 3 (03/19/16 1838)  Complications: No apparent anesthesia complications

## 2016-03-20 NOTE — Progress Notes (Signed)
Patient ID: Jacobe Study, male   DOB: August 23, 1965, 51 y.o.   MRN: [id number] TCTS DAILY ICU PROGRESS NOTE                   [address]            [place] [address]          [phone]   1 Day Post-Op Procedure(s) (LRB): CORONARY ARTERY BYPASS GRAFTING (CABG), ON PUMP, TIMES FIVE, USING LEFT INTERNAL MAMMARY ARTERY AND RIGHT GREATER SAPHENOUS VEIN HARVESTED ENDOSCOPICALLY (N/A) TRANSESOPHAGEAL ECHOCARDIOGRAM (TEE) (N/A)  Total Length of Stay:  LOS: 6 days   Subjective: Awake and alert, neuro intact   Objective: Vital signs in last 24 hours: Temp:  [96.6 F (35.9 C)-100.2 F (37.9 C)] 99.5 F (37.5 C) (01/09 0700) Pulse Rate:  [85-104] 88 (01/09 0700) Cardiac Rhythm: Normal sinus rhythm;Sinus tachycardia (01/09 0400) Resp:  [12-25] 18 (01/09 0700) BP: (85-157)/(57-115) 98/74 (01/09 0700) SpO2:  [93 %-100 %] 99 % (01/09 0700) Arterial Line BP: (65-197)/(47-103) 65/62 (01/09 0145) FiO2 (%):  [40 %-50 %] 40 % (01/08 1644) Weight:  [160 lb 15 oz (73 kg)] 160 lb 15 oz (73 kg) (01/09 0500)  Filed Weights   03/18/16 0511 03/19/16 0502 03/20/16 0500  Weight: 148 lb 6.4 oz (67.3 kg) 147 lb 14.4 oz (67.1 kg) 160 lb 15 oz (73 kg)    Weight change: 13 lb 0.6 oz (5.913 kg)   Hemodynamic parameters for last 24 hours: PAP: (22-55)/(9-34) 38/20 CO:  [3.3 L/min-6.1 L/min] 5 L/min CI:  [1.8 L/min/m2-3.3 L/min/m2] 2.7 L/min/m2  Intake/Output from previous day: 01/08 0701 - 01/09 0700 In: 5030 [P.O.:360; I.V.:3585; Blood:385; IV Piggyback:700] Out: 2675 [Urine:1615; Blood:750; Chest Tube:310]  Intake/Output this shift: No intake/output data recorded.  Current Meds: Scheduled Meds: . acetaminophen  1,000 mg Oral Q6H   Or  . acetaminophen (TYLENOL) oral liquid 160 mg/5 mL  1,000 mg Per Tube Q6H  . aspirin EC  325 mg Oral Daily   Or  . aspirin  324 mg Per Tube Daily  . atorvastatin  80 mg Oral q1800  . bisacodyl  10 mg Oral Daily   Or  . bisacodyl  10 mg  Rectal Daily  . cefUROXime (ZINACEF)  IV  1.5 g Intravenous Q12H  . docusate sodium  200 mg Oral Daily  . insulin aspart  0-24 Units Subcutaneous Q4H  . metoprolol tartrate  12.5 mg Oral BID   Or  . metoprolol tartrate  12.5 mg Per Tube BID  . pantoprazole  40 mg Oral Daily  . potassium chloride (KCL MULTIRUN) 30 mEq in 265 mL IVPB  30 mEq Intravenous Once  . sodium chloride flush  3 mL Intravenous Q12H   Continuous Infusions: . sodium chloride 20 mL/hr at 03/20/16 0700  . sodium chloride    . sodium chloride 20 mL/hr at 03/20/16 0700  . dexmedetomidine 0.203 mcg/kg/hr (03/20/16 0700)  . lactated ringers 20 mL/hr at 03/20/16 0700  . lactated ringers 20 mL/hr at 03/20/16 0700  . nitroGLYCERIN Stopped (03/19/16 1810)  . phenylephrine (NEO-SYNEPHRINE) Adult infusion 7.067 mcg/min (03/20/16 0700)   PRN Meds:.sodium chloride, albumin human, lactated ringers, metoprolol, midazolam, morphine injection, ondansetron (ZOFRAN) IV, oxyCODONE, sodium chloride flush, traMADol  General appearance: alert, cooperative and no distress Neurologic: intact Heart: regular rate and rhythm, S1, S2 normal, no murmur, click, rub or gallop Lungs: diminished breath sounds bibasilar Abdomen: soft, non-tender; bowel sounds normal; no masses,  no organomegaly  Extremities: extremities normal, atraumatic, no cyanosis or edema and Homans sign is negative, no sign of DVT Wound: intact, no air leak from tubes   Lab Results: CBC: Recent Labs  03/19/16 1928 03/20/16 0325  WBC 11.4* 11.1*  HGB 12.3* 11.9*  HCT 36.6* 35.7*  PLT 153 146*   BMET:  Recent Labs  03/19/16 0238  03/19/16 1922 03/19/16 1928 03/20/16 0325  NA 135  < > 137  --  134*  K 4.1  < > 4.4  --  4.3  CL 99*  < > 104  --  102  CO2 28  --   --   --  27  GLUCOSE 111*  < > 112*  --  120*  BUN 11  < > 9  --  8  CREATININE 1.28*  < > 0.90 0.93 1.00  CALCIUM 8.4*  --   --   --  7.6*  < > = values in this interval not displayed.  CMET: Lab  Results  Component Value Date   WBC 11.1 (H) 03/20/2016   HGB 11.9 (L) 03/20/2016   HCT 35.7 (L) 03/20/2016   PLT 146 (L) 03/20/2016   GLUCOSE 120 (H) 03/20/2016   CHOL 153 03/15/2016   TRIG 70 03/15/2016   HDL 50 03/15/2016   LDLCALC 89 03/15/2016   ALT 22 03/18/2016   AST 27 03/18/2016   NA 134 (L) 03/20/2016   K 4.3 03/20/2016   CL 102 03/20/2016   CREATININE 1.00 03/20/2016   BUN 8 03/20/2016   CO2 27 03/20/2016   TSH 4.546 (H) 03/14/2016   INR 1.18 03/19/2016   HGBA1C 5.3 03/14/2016    PT/INR:  Recent Labs  03/19/16 1330  LABPROT 15.1  INR 1.18   Radiology: Dg Chest Port 1 View  Result Date: 03/20/2016 CLINICAL DATA:  Status post coronary bypass grafting EXAM: PORTABLE CHEST 1 VIEW COMPARISON:  03/19/2016 FINDINGS: Cardiac shadow is stable. The endotracheal tube and nasogastric catheter have been removed in the interval. Swan-Ganz catheter, mediastinal drain and left thoracostomy catheter are again noted and stable. Mild bibasilar atelectasis is seen. The inspiratory effort is less than that seen on prior exam. Stable calcification in the right lung is noted. IMPRESSION: Mild bibasilar atelectasis likely related to poor inspiratory effort. Tubes and lines as described. Electronically Signed   By: Alcide CleverMark  Lukens M.D.   On: 03/20/2016 07:10   Dg Chest Port 1 View  Result Date: 03/19/2016 CLINICAL DATA:  Coronary artery disease.  Status post CABG. EXAM: PORTABLE CHEST 1 VIEW COMPARISON:  03/14/2016 FINDINGS: Endotracheal tube, NG tube and chest tubes and Swan-Ganz catheter all appear in good position. No pneumothorax. No effusions. Minimal atelectasis in the right midzone and at the left lung base. IMPRESSION: Satisfactory appearance of the chest after CABG. Minimal atelectasis. Electronically Signed   By: Francene BoyersJames  Maxwell M.D.   On: 03/19/2016 14:22     Assessment/Plan: S/P Procedure(s) (LRB): CORONARY ARTERY BYPASS GRAFTING (CABG), ON PUMP, TIMES FIVE, USING LEFT INTERNAL  MAMMARY ARTERY AND RIGHT GREATER SAPHENOUS VEIN HARVESTED ENDOSCOPICALLY (N/A) TRANSESOPHAGEAL ECHOCARDIOGRAM (TEE) (N/A) Mobilize Diuresis d/c tubes/lines Expected Acute  Blood - loss Anemia Low dose toradol   Delight Ovensdward B Dealva Lafoy 03/20/2016 7:21 AM

## 2016-03-21 ENCOUNTER — Inpatient Hospital Stay (HOSPITAL_COMMUNITY): Payer: Self-pay

## 2016-03-21 LAB — GLUCOSE, CAPILLARY
GLUCOSE-CAPILLARY: 133 mg/dL — AB (ref 65–99)
GLUCOSE-CAPILLARY: 117 mg/dL — AB (ref 65–99)
GLUCOSE-CAPILLARY: 127 mg/dL — AB (ref 65–99)
GLUCOSE-CAPILLARY: 110 mg/dL — AB (ref 65–99)
Glucose-Capillary: 95 mg/dL (ref 65–99)

## 2016-03-21 LAB — BASIC METABOLIC PANEL
Anion gap: 5 (ref 5–15)
BUN: 8 mg/dL (ref 6–20)
CO2: 27 mmol/L (ref 22–32)
Calcium: 7.8 mg/dL — ABNORMAL LOW (ref 8.9–10.3)
Chloride: 101 mmol/L (ref 101–111)
Creatinine, Ser: 0.96 mg/dL (ref 0.61–1.24)
GFR calc Af Amer: >60 mL/min (ref >60)
GFR calc non Af Amer: >60 mL/min (ref >60)
Glucose, Bld: 104 mg/dL — ABNORMAL HIGH (ref 65–99)
Potassium: 3.9 mmol/L (ref 3.5–5.1)
Sodium: 133 mmol/L — ABNORMAL LOW (ref 135–145)

## 2016-03-21 LAB — CBC
HCT: 33.7 % — ABNORMAL LOW (ref 39.0–52.0)
Hemoglobin: 11.3 g/dL — ABNORMAL LOW (ref 13.0–17.0)
MCH: 32.2 pg (ref 26.0–34.0)
MCHC: 33.5 g/dL (ref 30.0–36.0)
MCV: 96 fL (ref 78.0–100.0)
Platelets: 127 10*3/uL — ABNORMAL LOW (ref 150–400)
RBC: 3.51 MIL/uL — ABNORMAL LOW (ref 4.22–5.81)
RDW: 13.2 % (ref 11.5–15.5)
WBC: 10 10*3/uL (ref 4.0–10.5)

## 2016-03-21 MED ORDER — BISACODYL 10 MG RE SUPP: 10.0000 mg | Freq: Every day | RECTAL | Status: DC | PRN

## 2016-03-21 MED ORDER — METOPROLOL TARTRATE 12.5 MG HALF TABLET: 12.5000 mg | ORAL_TABLET | Freq: Two times a day (BID) | ORAL | Status: DC

## 2016-03-21 MED ORDER — OXYCODONE HCL 5 MG PO TABS
5.0000 mg | ORAL_TABLET | ORAL | Status: DC | PRN
  Administered 2016-03-21 (×3): 5 mg via ORAL
  Filled 2016-03-21: qty 1

## 2016-03-21 MED ORDER — PANTOPRAZOLE SODIUM 40 MG PO TBEC
40.0000 mg | DELAYED_RELEASE_TABLET | Freq: Every day | ORAL | Status: DC
  Administered 2016-03-22 – 2016-03-27 (×6): 40 mg via ORAL
  Filled 2016-03-21 (×6): qty 1

## 2016-03-21 MED ORDER — TRAMADOL HCL 50 MG PO TABS
50.0000 mg | ORAL_TABLET | Freq: Four times a day (QID) | ORAL | Status: DC | PRN
  Administered 2016-03-21 – 2016-03-27 (×15): 50 mg via ORAL
  Filled 2016-03-21 (×16): qty 1

## 2016-03-21 MED ORDER — DOCUSATE SODIUM 100 MG PO CAPS
200.0000 mg | ORAL_CAPSULE | Freq: Every day | ORAL | Status: DC
  Administered 2016-03-22 – 2016-03-27 (×6): 200 mg via ORAL
  Filled 2016-03-21 (×6): qty 2

## 2016-03-21 MED ORDER — MOVING RIGHT ALONG BOOK
Freq: Once | Status: AC
  Administered 2016-03-21: 18:00:00
  Filled 2016-03-21: qty 1

## 2016-03-21 MED ORDER — SODIUM CHLORIDE 0.9 % IV SOLN: 250.0000 mL | INTRAVENOUS | Status: DC | PRN

## 2016-03-21 MED ORDER — SODIUM CHLORIDE 0.9% FLUSH
3.0000 mL | Freq: Two times a day (BID) | INTRAVENOUS | Status: DC
  Administered 2016-03-22 – 2016-03-26 (×6): 3 mL via INTRAVENOUS

## 2016-03-21 MED ORDER — ASPIRIN EC 81 MG PO TBEC
81.0000 mg | DELAYED_RELEASE_TABLET | Freq: Every day | ORAL | Status: DC
  Administered 2016-03-22 – 2016-03-27 (×6): 81 mg via ORAL
  Filled 2016-03-21 (×6): qty 1

## 2016-03-21 MED ORDER — ONDANSETRON HCL 4 MG/2ML IJ SOLN: 4.0000 mg | Freq: Four times a day (QID) | INTRAMUSCULAR | Status: DC | PRN

## 2016-03-21 MED ORDER — GUAIFENESIN ER 600 MG PO TB12: 600.0000 mg | ORAL_TABLET | Freq: Two times a day (BID) | ORAL | Status: DC | PRN

## 2016-03-21 MED ORDER — DIPHENHYDRAMINE HCL 25 MG PO CAPS
25.0000 mg | ORAL_CAPSULE | Freq: Every evening | ORAL | Status: DC | PRN
  Filled 2016-03-21: qty 1

## 2016-03-21 MED ORDER — TRAMADOL HCL 50 MG PO TABS
50.0000 mg | ORAL_TABLET | Freq: Four times a day (QID) | ORAL | Status: DC
  Administered 2016-03-21 (×2): 50 mg via ORAL
  Filled 2016-03-21 (×2): qty 1

## 2016-03-21 MED ORDER — METOPROLOL TARTRATE 12.5 MG HALF TABLET
12.5000 mg | ORAL_TABLET | Freq: Three times a day (TID) | ORAL | Status: DC
  Administered 2016-03-21 – 2016-03-27 (×17): 12.5 mg via ORAL
  Filled 2016-03-21 (×17): qty 1

## 2016-03-21 MED ORDER — BISACODYL 5 MG PO TBEC: 10.0000 mg | DELAYED_RELEASE_TABLET | Freq: Every day | ORAL | Status: DC | PRN

## 2016-03-21 MED ORDER — ONDANSETRON HCL 4 MG PO TABS: 4.0000 mg | ORAL_TABLET | Freq: Four times a day (QID) | ORAL | Status: DC | PRN

## 2016-03-21 MED ORDER — SODIUM CHLORIDE 0.9% FLUSH: 3.0000 mL | INTRAVENOUS | Status: DC | PRN

## 2016-03-21 MED ORDER — OXYCODONE HCL 5 MG PO TABS
10.0000 mg | ORAL_TABLET | ORAL | Status: DC | PRN
  Administered 2016-03-21 – 2016-03-27 (×27): 10 mg via ORAL
  Filled 2016-03-21 (×27): qty 2

## 2016-03-21 MED ORDER — INSULIN ASPART 100 UNIT/ML ~~LOC~~ SOLN
0.0000 [IU] | Freq: Three times a day (TID) | SUBCUTANEOUS | Status: DC
  Administered 2016-03-21 – 2016-03-24 (×6): 2 [IU] via SUBCUTANEOUS

## 2016-03-21 NOTE — Progress Notes (Signed)
[facility] BRIEF SICU PROGRESS NOTE  2 Days Post-Op  S/P Procedure(s) (LRB): CORONARY ARTERY BYPASS GRAFTING (CABG), ON PUMP, TIMES FIVE, USING LEFT INTERNAL MAMMARY ARTERY AND RIGHT GREATER SAPHENOUS VEIN HARVESTED ENDOSCOPICALLY (N/A) TRANSESOPHAGEAL ECHOCARDIOGRAM (TEE) (N/A)   Stable day Awaiting bed for transfer  Plan: Continue current plan  Purcell Nailslarence H Rosielee Corporan, MD 03/21/2016 7:20 PM

## 2016-03-21 NOTE — Addendum Note (Signed)
Addendum  created 03/21/16 0804 by Kipp Broodavid Anayla Giannetti, MD   Sign clinical note

## 2016-03-21 NOTE — Progress Notes (Signed)
Patient ID: Douglas [name], male   DOB: [birth year], 51 y.o.   MRN: [id number] TCTS DAILY ICU PROGRESS NOTE                   [address]            [place] [address]          [phone]   2 Days Post-Op Procedure(s) (LRB): CORONARY ARTERY BYPASS GRAFTING (CABG), ON PUMP, TIMES FIVE, USING LEFT INTERNAL MAMMARY ARTERY AND RIGHT GREATER SAPHENOUS VEIN HARVESTED ENDOSCOPICALLY (N/A) TRANSESOPHAGEAL ECHOCARDIOGRAM (TEE) (N/A)  Total Length of Stay:  LOS: 7 days   Subjective: Stable post op pain control adequate, patient ambulating   Objective: Vital signs in last 24 hours: Temp:  [98.3 F (36.8 C)-99.5 F (37.5 C)] 99 F (37.2 C) (01/10 0400) Pulse Rate:  [82-107] 102 (01/10 0700) Cardiac Rhythm: Normal sinus rhythm (01/10 0400) Resp:  [9-20] 17 (01/10 0700) BP: (83-131)/(56-79) 131/63 (01/10 0700) SpO2:  [89 %-100 %] 95 % (01/10 0700) Weight:  [160 lb 0.9 oz (72.6 kg)] 160 lb 0.9 oz (72.6 kg) (01/10 0500)  Filed Weights   03/19/16 0502 03/20/16 0500 03/21/16 0500  Weight: 147 lb 14.4 oz (67.1 kg) 160 lb 15 oz (73 kg) 160 lb 0.9 oz (72.6 kg)    Weight change: -14.1 oz (-0.4 kg)   Hemodynamic parameters for last 24 hours:    Intake/Output from previous day: 01/09 0701 - 01/10 0700 In: 344.6 [I.V.:244.6; IV Piggyback:100] Out: 1350 [Urine:1350]  Intake/Output this shift: No intake/output data recorded.  Current Meds: Scheduled Meds: . acetaminophen  1,000 mg Oral Q6H   Or  . acetaminophen (TYLENOL) oral liquid 160 mg/5 mL  1,000 mg Per Tube Q6H  . aspirin EC  325 mg Oral Daily   Or  . aspirin  324 mg Per Tube Daily  . atorvastatin  80 mg Oral q1800  . bisacodyl  10 mg Oral Daily   Or  . bisacodyl  10 mg Rectal Daily  . docusate sodium  200 mg Oral Daily  . enoxaparin (LOVENOX) injection  40 mg Subcutaneous QHS  . insulin aspart  0-24 Units Subcutaneous Q4H  . metoprolol tartrate  12.5 mg Oral BID   Or  . metoprolol tartrate  12.5 mg Per  Tube BID  . pantoprazole  40 mg Oral Daily  . potassium chloride (KCL MULTIRUN) 30 mEq in 265 mL IVPB  30 mEq Intravenous Once  . sodium chloride flush  3 mL Intravenous Q12H   Continuous Infusions: . sodium chloride 20 mL/hr at 03/20/16 0700  . sodium chloride    . sodium chloride Stopped (03/20/16 0730)  . dexmedetomidine Stopped (03/20/16 0730)  . lactated ringers 20 mL/hr at 03/21/16 0600  . lactated ringers Stopped (03/20/16 0730)  . nitroGLYCERIN Stopped (03/19/16 1810)  . phenylephrine (NEO-SYNEPHRINE) Adult infusion Stopped (03/20/16 0800)   PRN Meds:.sodium chloride, ketorolac, lactated ringers, metoprolol, midazolam, morphine injection, ondansetron (ZOFRAN) IV, oxyCODONE, sodium chloride flush, traMADol  General appearance: alert and cooperative Neurologic: intact Heart: regular rate and rhythm, S1, S2 normal, no murmur, click, rub or gallop Lungs: diminished breath sounds bibasilar Abdomen: soft, non-tender; bowel sounds normal; no masses,  no organomegaly Extremities: extremities normal, atraumatic, no cyanosis or edema and Homans sign is negative, no sign of DVT Wound: sternum intact  Lab Results: CBC: Recent Labs  03/20/16 1718 03/21/16 0400  WBC 10.9* 10.0  HGB 11.8* 11.3*  HCT 34.9* 33.7*  PLT 142* 127*  BMET:  Recent Labs  03/20/16 0325 03/20/16 1705 03/20/16 1718 03/21/16 0400  NA 134* 133*  --  133*  K 4.3 4.0  --  3.9  CL 102 96*  --  101  CO2 27  --   --  27  GLUCOSE 120* 113*  --  104*  BUN 8 11  --  8  CREATININE 1.00 1.00 0.93 0.96  CALCIUM 7.6*  --   --  7.8*    CMET: Lab Results  Component Value Date   WBC 10.0 03/21/2016   HGB 11.3 (L) 03/21/2016   HCT 33.7 (L) 03/21/2016   PLT 127 (L) 03/21/2016   GLUCOSE 104 (H) 03/21/2016   CHOL 153 03/15/2016   TRIG 70 03/15/2016   HDL 50 03/15/2016   LDLCALC 89 03/15/2016   ALT 22 03/18/2016   AST 27 03/18/2016   NA 133 (L) 03/21/2016   K 3.9 03/21/2016   CL 101 03/21/2016    CREATININE 0.96 03/21/2016   BUN 8 03/21/2016   CO2 27 03/21/2016   TSH 4.546 (H) 03/14/2016   INR 1.18 03/19/2016   HGBA1C 5.3 03/14/2016    PT/INR:  Recent Labs  03/19/16 1330  LABPROT 15.1  INR 1.18   Radiology: No results found.   Assessment/Plan: S/P Procedure(s) (LRB): CORONARY ARTERY BYPASS GRAFTING (CABG), ON PUMP, TIMES FIVE, USING LEFT INTERNAL MAMMARY ARTERY AND RIGHT GREATER SAPHENOUS VEIN HARVESTED ENDOSCOPICALLY (N/A) TRANSESOPHAGEAL ECHOCARDIOGRAM (TEE) (N/A) Mobilize Diuresis Plan for transfer to step-down: see transfer orders     Delight Ovensdward B Ibraheem Voris 03/21/2016 7:35 AM

## 2016-03-21 NOTE — Progress Notes (Signed)
Anesthesiology Follow-up:  Awake and alert, neuro intact having moderate incisional pain. He describes, remembering the weaning process and felt discomfort with the endotracheal tube in place prior to extubation. He received 10 mg versed and 1350 mcg of fentanyl intraoperatively and was maintained on precedex at 0.7 mcg/kg/min. These dosages are within the usual ranges for CABG surgeries. He was extubated 4 hours post-op.   VS: T- 36.9 BP 113/74 HR 102 (SR) RR- 17 O2 sat 95% on 2 L  K-3.9 BUN/CR.-8/0.96 glucose- 105 H/H- 11.3/33.7 platelets- 127,000  Doing well at present, as noted he remembered the weaning process.  Douglas [name]

## 2016-03-21 NOTE — Addendum Note (Signed)
Addendum  created 03/21/16 0746 by Kipp Broodavid Pachia Strum, MD   Pend clinical note

## 2016-03-21 NOTE — Op Note (Signed)
NAMENATION, CRADLE NO.:  [id number]  MEDICAL RECORD NO.:  [id number]  LOCATION:  B16C                         FACILITY:  [facility]  PHYSICIAN:  Sheliah Plane, MD    DATE OF BIRTH:  October 04, 1965  DATE OF PROCEDURE:  03/19/2016 DATE OF DISCHARGE:                              OPERATIVE REPORT   PREOPERATIVE DIAGNOSIS:  Coronary occlusive disease with unstable angina.  POSTOPERATIVE DIAGNOSIS:  Coronary occlusive disease with unstable angina.  SURGICAL PROCEDURES:  Coronary artery bypass grafting x5 with left internal mammary to the left anterior descending coronary artery, reverse saphenous vein graft to the diagonal coronary artery, reverse saphenous vein graft to the second obtuse marginal, sequential reverse saphenous vein graft to the posterior descending and posterolateral branches of the right coronary artery with right greater saphenous vein endo-harvesting, right thigh and calf.  SURGEON:  Sheliah Plane, MD  FIRST ASSISTANT:  Rowe Clack, P.A.-C.  BRIEF HISTORY:  The patient is a 51 year old male with known coronary occlusive disease who previously had a stent placed in the distal right coronary artery.  The patient was admitted with unstable anginal symptoms.  He was stabilized medically and underwent cardiac catheterization, which demonstrated significant 3-vessel coronary artery disease including 80% right stenosis in the previous stent and 78% stenosis in the proximal PD and PL branches.  He had an 80% LAD that also involved the diagonal.  The circumflex system was a small system with multiple small branches, but had 80% stenosis.  Overall, ventricular function was preserved.  The patient previously had a CT scan of the abdomen done in the spring of 2017, suggesting possible pancreatic malignancy.  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  Risks and options were discussed with the patient.  We discussed the possible use of the left radial artery in addition; however, the patient notes that he plays guitar both for enjoyment and momentarily, and preferred not to have anything interfere with his left arm.  The patient agreed to proceeding with surgery and signed informed consent.  DESCRIPTION OF THE PROCEDURE:  The patient underwent general endotracheal anesthesia without incident.  Swan-Ganz and arterial line monitors had been placed by Dr. Noreene Larsson.  After the patient was intubated, a TEE probe was placed.  Overall, ventricular function was preserved without evidence of valvular disease.  There was a question that potentially the patient had a patent foramen.  The skin of the chest and legs was prepped with Betadine and draped in usual sterile manner.  Appropriate time-out was performed.  Then, we proceeded with endovein harvesting of the right greater saphenous vein in the thigh and calf.  Median sternotomy was performed, left internal mammary artery was dissected down as a pedicle graft.  The distal artery was divided, had good free flow.  Pericardium was opened.  Overall, ventricular function appeared preserved.  The patient was systemically heparinized. Ascending aorta was cannulated.  The right atrium was cannulated and aortic root vent cardioplegia needle was introduced into the ascending aorta.  The patient was placed on cardiopulmonary bypass 2.4 L/min/m2. Sites of anastomosis were selected and dissected out of the epicardium. The patient's body temperature was cooled to 32 degrees.  Aortic crossclamp was applied and 500 mL of cold blood potassium cardioplegia was administered with diastolic arrest of the heart.  Myocardial septal temperature was monitored at the crossclamp period.  We turned our attention first to  the right system.  The posterior descending coronary artery was opened, was a reasonable size vessel, admitted a 1.5-mm probe distally.  Using a diamond-type, side-to-side anastomosis was carried out with a running 7-0 Prolene.  The distal extent of the same vein was then carried to the posterolateral branch, which was of equal size to the posterior descending, vessel was opened, admitted 1.5-mm probe and running 7-0 Prolene used for distal anastomosis.  Intermittently, cold blood cardioplegia was administered down the vein grafts.  The heart was then elevated and the lateral wall was examined as noted.  The circumflex system was made up of multiple small RV branches.  The second one of this the largest on the lateral wall was selected for bypass, even this vessel was relatively small, 1.2 mm in size.  The vessel was opened and using a running 8-0 Prolene, a segment of reverse saphenous vein graft was anastomosed to the vessel.  The diagonal coronary artery was then opened, was 1.5 mm in size; using a running 7-0 Prolene, distal anastomosis was performed with a segment of reverse saphenous vein graft.  In the midportion of the LAD, the vessel was opened, admitted a 1.5-mm probe distally.  Using a running 8-0 Prolene, left internal mammary artery was anastomosed to the left anterior descending coronary artery.  With the crossclamp still in place, three punch aortotomies were performed and each of the three vein grafts were anastomosed to the ascending aorta.  The heart was allowed to passively fill and de-air. The bulldog on the mammary artery was removed with prompt rise in myocardial septal temperature.  Aortic crossclamp was removed.  Total crossclamp time of 101 minutes.  The patient spontaneously converted to sinus rhythm.  Atrial and ventricular pacing wires were applied.  Sites of anastomosis were inspected and were free of bleeding.  The patient was then ventilated and weaned from  cardiopulmonary bypass without difficulty and remained hemodynamically stable, was decannulated in usual fashion.  Protamine sulfate was administered with operative field hemostatic.  A left pleural tube and a Blake mediastinal drain were left in place.  The sternum was closed with #6 stainless steel wire.  Fascia was closed with interrupted 0 Vicryl, running 3-0 Vicryl in the subcutaneous tissue, and 4-0 subcuticular stitch in skin edges.  Dry dressings were applied.  Sponge and needle counts were reported as correct at the completion of procedure.  The patient tolerated the procedure without obvious complication and was transferred to the Surgical Intensive care Unit for further postoperative care.  Total pump time was 134 minutes.  The patient did not require any blood bank blood products during the operative procedure.     Sheliah PlaneEdward Divante Kotch, MD     EG/MEDQ  D:  03/20/2016  T:  03/21/2016  Job:  [id number]

## 2016-03-22 ENCOUNTER — Inpatient Hospital Stay (HOSPITAL_COMMUNITY): Payer: Self-pay

## 2016-03-22 LAB — CBC
HCT: 30.5 % — ABNORMAL LOW (ref 39.0–52.0)
Hemoglobin: 10.2 g/dL — ABNORMAL LOW (ref 13.0–17.0)
MCH: 32.1 pg (ref 26.0–34.0)
MCHC: 33.4 g/dL (ref 30.0–36.0)
MCV: 95.9 fL (ref 78.0–100.0)
Platelets: 150 10*3/uL (ref 150–400)
RBC: 3.18 MIL/uL — ABNORMAL LOW (ref 4.22–5.81)
RDW: 12.9 % (ref 11.5–15.5)
WBC: 9.8 10*3/uL (ref 4.0–10.5)

## 2016-03-22 LAB — BASIC METABOLIC PANEL
Anion gap: 7 (ref 5–15)
BUN: 8 mg/dL (ref 6–20)
CO2: 27 mmol/L (ref 22–32)
Calcium: 7.7 mg/dL — ABNORMAL LOW (ref 8.9–10.3)
Chloride: 98 mmol/L — ABNORMAL LOW (ref 101–111)
Creatinine, Ser: 0.94 mg/dL (ref 0.61–1.24)
GFR calc Af Amer: >60 mL/min (ref >60)
GFR calc non Af Amer: >60 mL/min (ref >60)
Glucose, Bld: 108 mg/dL — ABNORMAL HIGH (ref 65–99)
Potassium: 3.8 mmol/L (ref 3.5–5.1)
Sodium: 132 mmol/L — ABNORMAL LOW (ref 135–145)

## 2016-03-22 LAB — GLUCOSE, CAPILLARY
GLUCOSE-CAPILLARY: 127 mg/dL — AB (ref 65–99)
GLUCOSE-CAPILLARY: 112 mg/dL — AB (ref 65–99)
Glucose-Capillary: 119 mg/dL — ABNORMAL HIGH (ref 65–99)
Glucose-Capillary: 112 mg/dL — ABNORMAL HIGH (ref 65–99)

## 2016-03-22 NOTE — Progress Notes (Signed)
Patient sitting up in bed, will bring pain med with scheduled meds. Call light within reach

## 2016-03-22 NOTE — Progress Notes (Signed)
03/22/2016 1820 Received pt to room 2w30 from 2S.  Pt is A&O, is with some c/o pain.  Tele monitor applied and CCMD notified.  Oriented to room, call light and bed.  Call bell in reach.  Family at bedside. Kathryne HitchAllen, Christyl Osentoski C

## 2016-03-22 NOTE — Progress Notes (Signed)
CT surgery Waiting for stepdown bed Walked in hall  nsr

## 2016-03-22 NOTE — Clinical Social Work Note (Signed)
Clinical Social Work Assessment  Patient Details  Name: Douglas [name] MRN: [id number] Date of Birth: 01-29-1966  Date of referral:  03/22/16               Reason for consult:  [employer], Discharge Planning                Permission sought to share information with:    Permission granted to share information::  No, Yes, Verbal Permission Granted  Name::     Peter Congo Gleason  Agency::     Relationship::  ex-mother-in-law  Contact Information:  [phone]  Housing/Transportation Living arrangements for the past 2 months:  [place] of Information:  Patient, Medical Team Patient Interpreter Needed:  None Criminal Activity/Legal Involvement Pertinent to Current Situation/Hospitalization:  No - Comment as needed Significant Relationships:  Friend, Adult Children Lives with:  Friends (pt stated before [place] he was livign on friends couch) Do you feel safe going back to the place where you live?  No Need for family participation in patient care:  Yes (Comment)  Care giving concerns:  Did not have family at bedside   Social Worker assessment / plan:  CSW met patient at bedside to discuss patients needs at discharge. Patient stated he wants to go to SNF for 30 days for rehab to help him get back to his baseline. Patient stated before his stay at [facility] he was staying at a friends couch to try and save money to move into an apartment with a roommate. Patient stated he has young adult children and his ex-parent-in laws that are supportive. Patient stated he does not have insurance at the moment but wants to go to SNF after discharge. CSW has contacted Surveyor, quantity Zack about a possible LOG. Zack stated at this time patient needs t look into other alternatives to SNF.    Employment status:  [employer] information:  Self Pay (Medicaid Pending) PT Recommendations:  Not assessed at this time (Pt/Ot has not yet seen pt) Information / Referral to  community resources:  [place], food)  Patient/Family's Response to care: Patient verbalized appreciation and understanding for CSW role and involvement in care. Patient agreeable to wanting to go to SNF, but CSW will contact patient again to look for other alternatives.   Patient/Family's Understanding of and Emotional Response to Diagnosis, Current Treatment, and Prognosis:  Patient with good understanding of current medical state and limitations around most recent hospitalization.  Emotional Assessment Appearance:  Appears stated age Attitude/Demeanor/Rapport:  Other (Pleasant) Affect (typically observed):  Accepting, Appropriate, Calm, Pleasant Orientation:  Oriented to Self, Oriented to Place, Oriented to  Time, Oriented to Situation Alcohol / Substance use:  Tobacco Use, Alcohol Use Psych involvement (Current and /or in the community):  No (Comment)  Discharge Needs  Concerns to be addressed:  Care Coordination Readmission within the last 30 days:  No Current discharge risk:  Homeless Barriers to Discharge:  Continued Medical Work up   [employer], [place] 03/22/2016, 5:51 PM

## 2016-03-22 NOTE — Progress Notes (Signed)
Patient ID: Douglas [name], male   DOB: 1965/12/14, 51 y.o.   MRN: [id number] TCTS DAILY ICU PROGRESS NOTE                   [address]            [place] [address]          [phone]   3 Days Post-Op Procedure(s) (LRB): CORONARY ARTERY BYPASS GRAFTING (CABG), ON PUMP, TIMES FIVE, USING LEFT INTERNAL MAMMARY ARTERY AND RIGHT GREATER SAPHENOUS VEIN HARVESTED ENDOSCOPICALLY (N/A) TRANSESOPHAGEAL ECHOCARDIOGRAM (TEE) (N/A)  Total Length of Stay:  LOS: 8 days   Subjective: Walking in unit and talking on phone   Objective: Vital signs in last 24 hours: Temp:  [98.3 F (36.8 C)-100.5 F (38.1 C)] 98.8 F (37.1 C) (01/11 0738) Pulse Rate:  [92-120] 100 (01/11 0700) Cardiac Rhythm: Sinus tachycardia (01/11 0600) Resp:  [8-20] 19 (01/11 0700) BP: (88-129)/(57-84) 103/74 (01/11 0700) SpO2:  [88 %-99 %] 94 % (01/11 0700) Weight:  [72.3 kg (159 lb 6.3 oz)] 72.3 kg (159 lb 6.3 oz) (01/11 0600)  Filed Weights   03/20/16 0500 03/21/16 0500 03/22/16 0600  Weight: 73 kg (160 lb 15 oz) 72.6 kg (160 lb 0.9 oz) 72.3 kg (159 lb 6.3 oz)    Weight change: -0.3 kg (-10.6 oz)   Hemodynamic parameters for last 24 hours:    Intake/Output from previous day: 01/10 0701 - 01/11 0700 In: 20 [I.V.:20] Out: 1300 [Urine:1300]  Intake/Output this shift: No intake/output data recorded.  Current Meds: Scheduled Meds: . aspirin EC  81 mg Oral Daily  . atorvastatin  80 mg Oral q1800  . docusate sodium  200 mg Oral Daily  . enoxaparin (LOVENOX) injection  40 mg Subcutaneous QHS  . insulin aspart  0-24 Units Subcutaneous TID AC & HS  . metoprolol tartrate  12.5 mg Oral Q8H  . pantoprazole  40 mg Oral QAC breakfast  . sodium chloride flush  3 mL Intravenous Q12H   Continuous Infusions: PRN Meds:.sodium chloride, bisacodyl **OR** bisacodyl, diphenhydrAMINE, guaiFENesin, ondansetron **OR** ondansetron (ZOFRAN) IV, oxyCODONE, sodium chloride flush, traMADol  General  appearance: alert and cooperative Neurologic: intact Heart: regular rate and rhythm, S1, S2 normal, no murmur, click, rub or gallop Lungs: clear to auscultation bilaterally Abdomen: soft, non-tender; bowel sounds normal; no masses,  no organomegaly Extremities: extremities normal, atraumatic, no cyanosis or edema and Homans sign is negative, no sign of DVT Wound: sternum stable  Lab Results: CBC: Recent Labs  03/21/16 0400 03/22/16 0317  WBC 10.0 9.8  HGB 11.3* 10.2*  HCT 33.7* 30.5*  PLT 127* 150   BMET:  Recent Labs  03/21/16 0400 03/22/16 0317  NA 133* 132*  K 3.9 3.8  CL 101 98*  CO2 27 27  GLUCOSE 104* 108*  BUN 8 8  CREATININE 0.96 0.94  CALCIUM 7.8* 7.7*    CMET: Lab Results  Component Value Date   WBC 9.8 03/22/2016   HGB 10.2 (L) 03/22/2016   HCT 30.5 (L) 03/22/2016   PLT 150 03/22/2016   GLUCOSE 108 (H) 03/22/2016   CHOL 153 03/15/2016   TRIG 70 03/15/2016   HDL 50 03/15/2016   LDLCALC 89 03/15/2016   ALT 22 03/18/2016   AST 27 03/18/2016   NA 132 (L) 03/22/2016   K 3.8 03/22/2016   CL 98 (L) 03/22/2016   CREATININE 0.94 03/22/2016   BUN 8 03/22/2016   CO2 27 03/22/2016   TSH 4.546 (  H) 03/14/2016   INR 1.18 03/19/2016   HGBA1C 5.3 03/14/2016    PT/INR:  Recent Labs  03/19/16 1330  LABPROT 15.1  INR 1.18   Radiology: Dg Chest 2 View  Result Date: 03/22/2016 CLINICAL DATA:  CABG. EXAM: CHEST  2 VIEW COMPARISON:  03/21/2016 . FINDINGS: Interim removal of right IJ sheath. Prior CABG. Heart size normal. Low lung volumes with basilar atelectasis. No interim change small right upper lobe calcified pulmonary nodule consistent granuloma. Stable from prior exam . Small bilateral pleural effusions. IMPRESSION: 1. Interim removal of right IJ sheath. 2. Prior CABG.  Heart size stable. 3. Low lung volumes with persistent bibasilar atelectasis without significant change. Small bilateral pleural effusions. Electronically Signed   By: Maisie Fushomas  Register   On:  03/22/2016 06:59     Assessment/Plan: S/P Procedure(s) (LRB): CORONARY ARTERY BYPASS GRAFTING (CABG), ON PUMP, TIMES FIVE, USING LEFT INTERNAL MAMMARY ARTERY AND RIGHT GREATER SAPHENOUS VEIN HARVESTED ENDOSCOPICALLY (N/A) TRANSESOPHAGEAL ECHOCARDIOGRAM (TEE) (N/A) Mobilize Diuresis Plan for transfer to step-down: see transfer orders ordered yesterday still waiting for 2w bed Case management consult done- no care at home    Delight Ovensdward B Emmry Hinsch 03/22/2016 7:42 AM

## 2016-03-23 LAB — GLUCOSE, CAPILLARY
GLUCOSE-CAPILLARY: 123 mg/dL — AB (ref 65–99)
GLUCOSE-CAPILLARY: 121 mg/dL — AB (ref 65–99)
Glucose-Capillary: 140 mg/dL — ABNORMAL HIGH (ref 65–99)
Glucose-Capillary: 116 mg/dL — ABNORMAL HIGH (ref 65–99)

## 2016-03-23 MED ORDER — BISACODYL 5 MG PO TBEC: 10.0000 mg | DELAYED_RELEASE_TABLET | Freq: Every day | ORAL | Status: DC | PRN

## 2016-03-23 MED ORDER — MOVING RIGHT ALONG BOOK
Freq: Once | Status: AC
  Administered 2016-03-23: 15:00:00
  Filled 2016-03-23: qty 1

## 2016-03-23 MED ORDER — SODIUM CHLORIDE 0.9% FLUSH
3.0000 mL | Freq: Two times a day (BID) | INTRAVENOUS | Status: DC
  Administered 2016-03-23 – 2016-03-25 (×4): 3 mL via INTRAVENOUS

## 2016-03-23 MED ORDER — SODIUM CHLORIDE 0.9% FLUSH: 3.0000 mL | INTRAVENOUS | Status: DC | PRN

## 2016-03-23 MED ORDER — SODIUM CHLORIDE 0.9 % IV SOLN: 250.0000 mL | INTRAVENOUS | Status: DC | PRN

## 2016-03-23 MED ORDER — BISACODYL 10 MG RE SUPP: 10.0000 mg | Freq: Every day | RECTAL | Status: DC | PRN

## 2016-03-23 MED ORDER — ONDANSETRON HCL 4 MG PO TABS: 4.0000 mg | ORAL_TABLET | Freq: Four times a day (QID) | ORAL | Status: DC | PRN

## 2016-03-23 MED ORDER — ONDANSETRON HCL 4 MG/2ML IJ SOLN: 4.0000 mg | Freq: Four times a day (QID) | INTRAMUSCULAR | Status: DC | PRN

## 2016-03-23 NOTE — Progress Notes (Addendum)
      [address]       [place] [id number]             [phone]      4 Days Post-Op Procedure(s) (LRB): CORONARY ARTERY BYPASS GRAFTING (CABG), ON PUMP, TIMES FIVE, USING LEFT INTERNAL MAMMARY ARTERY AND RIGHT GREATER SAPHENOUS VEIN HARVESTED ENDOSCOPICALLY (N/A) TRANSESOPHAGEAL ECHOCARDIOGRAM (TEE) (N/A) Subjective: Feels good this morning. No issues overnight.   Objective: Vital signs in last 24 hours: Temp:  [98 F (36.7 C)-99.5 F (37.5 C)] 99.5 F (37.5 C) (01/12 0550) Pulse Rate:  [102-112] 109 (01/12 0550) Cardiac Rhythm: Sinus tachycardia (01/12 0802) Resp:  [15-22] 18 (01/12 0550) BP: (103-136)/(67-86) 136/81 (01/12 0550) SpO2:  [92 %-98 %] 94 % (01/12 0550) Weight:  [156 lb (70.8 kg)] 156 lb (70.8 kg) (01/12 0550)    Intake/Output from previous day: 01/11 0701 - 01/12 0700 In: 3 [I.V.:3] Out: 2925 [Urine:2925] Intake/Output this shift: No intake/output data recorded.  General appearance: alert, cooperative and no distress Heart: sinus tachcardia Lungs: clear to auscultation bilaterally Abdomen: soft, non-tender; bowel sounds normal; no masses,  no organomegaly Extremities: extremities normal, atraumatic, no cyanosis or edema Wound: clean, some bloody drainage on distal aspect of the sternal incision  Lab Results:  Recent Labs  03/21/16 0400 03/22/16 0317  WBC 10.0 9.8  HGB 11.3* 10.2*  HCT 33.7* 30.5*  PLT 127* 150   BMET:  Recent Labs  03/21/16 0400 03/22/16 0317  NA 133* 132*  K 3.9 3.8  CL 101 98*  CO2 27 27  GLUCOSE 104* 108*  BUN 8 8  CREATININE 0.96 0.94  CALCIUM 7.8* 7.7*    PT/INR: No results for input(s): LABPROT, INR in the last 72 hours. ABG    Component Value Date/Time   PHART 7.396 03/19/2016 1815   HCO3 27.9 03/19/2016 1815   TCO2 27 03/20/2016 1705   ACIDBASEDEF 4.0 (H) 03/19/2016 1719   O2SAT 96.0 03/19/2016 1815   CBG (last 3)   Recent Labs  03/22/16 2248 03/23/16 0623 03/23/16 1133    GLUCAP 112* 140* 123*    Assessment/Plan: S/P Procedure(s) (LRB): CORONARY ARTERY BYPASS GRAFTING (CABG), ON PUMP, TIMES FIVE, USING LEFT INTERNAL MAMMARY ARTERY AND RIGHT GREATER SAPHENOUS VEIN HARVESTED ENDOSCOPICALLY (N/A) TRANSESOPHAGEAL ECHOCARDIOGRAM (TEE) (N/A)  1. CV-good blood pressure control. Sinus tachycardia. EPW present  2. Pulm-Tolerating room air with good oxygen saturation. Last chest xray showed low lung volumes with persistent bibasilar atelectasis without significant change. Small bilateral effusions.  3. Renal-creatinine 0.94. Good urine output. Weight is trending down.  4. Endo-blood glucose levels under control.  5. H and H okay and platelets stable 6. Lovenox and ASA for anticoagulation.   Plan: Continues to make good progress. Continue to monitor distal portion of sternal incision due to small amount of bloody drainage. Plan to remove EPW tomorrow if rhythm remains stable.    LOS: 9 days    Sharlene Doryessa N Conte 03/23/2016  Will remove wires today Small serous drainage lower strnum this am, now stopped  Waiting for SNF, plan d/c tomorrow  I have seen and examined Minerva EndsJonathan Manthey and agree with the above assessment  and plan.  Delight OvensEdward B Amayiah Gosnell MD [place] [phone] Office [phone] 03/23/2016 2:11 PM

## 2016-03-23 NOTE — Progress Notes (Signed)
Notified charge nurse- Caralyn GuileSandra,RN of the order to remove epicardial pacing wires. Per Dois DavenportSandra, unable to pull out because it was ordered after 2pm. Will pull out in the morning.

## 2016-03-23 NOTE — Discharge Instructions (Signed)
Coronary Artery Bypass Grafting, Care After °Refer to this sheet in the next few weeks. These instructions provide you with information on caring for yourself after your procedure. Your health care provider may also give you more specific instructions. Your treatment has been planned according to current medical practices, but problems sometimes occur. Call your health care provider if you have any problems or questions after your procedure. °WHAT TO EXPECT AFTER THE PROCEDURE °Recovery from surgery will be different for everyone. Some people feel well after 3 or 4 weeks, while for others it takes longer. After your procedure, it is typical to have the following: °· Nausea and a lack of appetite.   °· Constipation. °· Weakness and fatigue.   °· Depression or irritability.   °· Pain or discomfort at your incision site. °HOME CARE INSTRUCTIONS °· Take medicines only as directed by your health care provider. Do not stop taking medicines or start any new medicines without first checking with your health care provider. °· Take your pulse as directed by your health care provider. °· Perform deep breathing as directed by your health care provider. If you were given a device called an incentive spirometer, use it to practice deep breathing several times a day. Support your chest with a pillow or your arms when you take deep breaths or cough. °· Keep incision areas clean, dry, and protected. Remove or change any bandages (dressings) only as directed by your health care provider. You may have skin adhesive strips over the incision areas. Do not take the strips off. They will fall off on their own. °· Check incision areas daily for any swelling, redness, or drainage. °· If incisions were made in your legs, do the following: °¨ Avoid crossing your legs.   °¨ Avoid sitting for long periods of time. Change positions every 30 minutes.   °¨ Elevate your legs when you are sitting. °· Wear compression stockings as directed by your  health care provider. These stockings help keep blood clots from forming in your legs. °· Take showers once your health care provider approves. Until then, only take sponge baths. Pat incisions dry. Do not rub incisions with a washcloth or towel. Do not take baths, swim, or use a hot tub until your health care provider approves. °· Eat foods that are high in fiber, such as raw fruits and vegetables, whole grains, beans, and nuts. Meats should be lean cut. Avoid canned, processed, and fried foods. °· Drink enough fluid to keep your urine clear or pale yellow. °· Weigh yourself every day. This helps identify if you are retaining fluid that may make your heart and lungs work harder. °· Rest and limit activity as directed by your health care provider. You may be instructed to: °¨ Stop any activity at once if you have chest pain, shortness of breath, irregular heartbeats, or dizziness. Get help right away if you have any of these symptoms. °¨ Move around frequently for short periods or take short walks as directed by your health care provider. Increase your activities gradually. You may need physical therapy or cardiac rehabilitation to help strengthen your muscles and build your endurance. °¨ Avoid lifting, pushing, or pulling anything heavier than 10 lb (4.5 kg) for at least 6 weeks after surgery. °· Do not drive until your health care provider approves.  °· Ask your health care provider when you may return to work. °· Ask your health care provider when you may resume sexual activity. °· Keep all follow-up visits as directed by your health care   provider. This is important. °SEEK MEDICAL CARE IF: °· You have swelling, redness, increasing pain, or drainage at the site of an incision. °· You have a fever. °· You have swelling in your ankles or legs. °· You have pain in your legs.   °· You gain 2 or more pounds (0.9 kg) a day. °· You are nauseous or vomit. °· You have diarrhea.  °SEEK IMMEDIATE MEDICAL CARE IF: °· You have  chest pain that goes to your jaw or arms. °· You have shortness of breath.   °· You have a fast or irregular heartbeat.   °· You notice a "clicking" in your breastbone (sternum) when you move.   °· You have numbness or weakness in your arms or legs. °· You feel dizzy or light-headed.   °MAKE SURE YOU: °· Understand these instructions. °· Will watch your condition. °· Will get help right away if you are not doing well or get worse. °This information is not intended to replace advice given to you by your health care provider. Make sure you discuss any questions you have with your health care provider. °Document Released: 09/15/2004 Document Revised: 03/19/2014 Document Reviewed: 08/05/2012 °Elsevier Interactive Patient Education © 2017 [employer] Inc[employer] ° °

## 2016-03-23 NOTE — Progress Notes (Signed)
Called and notified Dr. Tyrone SageGerhardt this morning that the patient's midsternal incison was bleeding. Charge nurse- Dois DavenportSandra put pressure dressing and applied pressure to the bleeding site till PA-Tessa came to check the site. Per Dr.  Tyrone SageGerhardt, there's no need to hold the lovenox and aspirin. Continue as ordered. Small bleeding noted this afternoon. Dressing changed.

## 2016-03-23 NOTE — Progress Notes (Signed)
CARDIAC REHAB PHASE I   PRE:  Rate/Rhythm: 105 ST  BP:  Supine: 111/69  Sitting:   Standing:    SaO2: 95%RA  MODE:  Ambulation: 790 ft   POST:  Rate/Rhythm: 104  BP:  Supine:   Sitting: 110/77  Standing:    SaO2: 96%RA 1142-1218 Pt walked 790 ft on RA with rolling walker and minimal asst. Second walk today. Stated he likes to walk. Back to bed at his request. Set up his lunch.   Luetta Nuttingharlene Christalyn Goertz, RN BSN  03/23/2016 12:18 PM

## 2016-03-23 NOTE — Clinical Social Work Placement (Signed)
   CLINICAL SOCIAL WORK PLACEMENT  NOTE  Date:  03/23/2016  Patient Details  Name: Minerva EndsJonathan Devaux MRN: [id number] Date of Birth: [birth year]  Clinical Social Work is seeking post-discharge placement for this patient at the Skilled  Nursing Facility level of care (*CSW will initial, date and re-position this form in  chart as items are completed):  Yes   Patient/family provided with [facility] Clinical Social Work Department's list of facilities offering this level of care within the geographic area requested by the patient (or if unable, by the patient's family).  Yes   Patient/family informed of their freedom to choose among providers that offer the needed level of care, that participate in Medicare, Medicaid or managed care program needed by the patient, have an available bed and are willing to accept the patient.  Yes   Patient/family informed of 's ownership interest in [facility] and [facility], as well as of the fact that they are under no obligation to receive care at these facilities.  PASRR submitted to EDS on       PASRR number received on       Existing PASRR number confirmed on       FL2 transmitted to all facilities in geographic area requested by pt/family on       FL2 transmitted to all facilities within larger geographic area on       Patient informed that his/her managed care company has contracts with or will negotiate with certain facilities, including the following:            Patient/family informed of bed offers received.  Patient chooses bed at       Physician recommends and patient chooses bed at      Patient to be transferred to   on  .  Patient to be transferred to facility by       Patient family notified on   of transfer.  Name of family member notified:        PHYSICIAN Please sign FL2     Additional Comment:    _______________________________________________ Althea CharonAshley C Anusha Claus, LCSW 03/23/2016, 8:19 AM

## 2016-03-23 NOTE — Discharge Summary (Signed)
Physician Discharge Summary  Patient ID: Douglas [name] MRN: [id number] DOB/AGE: 11/21/1965 51 y.o.  Admit date: 03/14/2016 Discharge date: 03/27/2016  Admission Diagnoses: Patient Active Problem List   Diagnosis Date Noted  . S/P CABG x 5 03/19/2016  . CAD (coronary artery disease), native coronary artery 03/15/2016  . Pancreatic divisum 04/18/2015  . History of acute pancreatitis 04/06/2015  . Alcohol abuse 03/09/2015  . Fatty liver 03/09/2015  . NSTEMI (non-ST elevated myocardial infarction) (HCC) 01/18/2014  . Tobacco abuse     Discharge Diagnoses:  Active Problems:   NSTEMI (non-ST elevated myocardial infarction) (HCC)   CAD (coronary artery disease), native coronary artery   S/P CABG x 5   Discharged Condition: good  Hospital Course:  [name] underwent a Coronary artery bypass grafting 5 by Dr. Tyrone Sage on 03/19/2016. He tolerated the procedure well and was transferred to the ICU. He was extubated in a timely manner. We initiated diuretic regimen for fluid overload. We continued to mobilize the patient. We weaned oxygen as tolerated. We continued to make progress and he was ready for transfer to the stepdown unit on postop day 2. We did have a bed shortage, therefore he didn't get transferred until postop day 3. On postop day 4 he remained slightly tachycardic. He did have a slight amount of bloody drainage from his distal incision. He was tolerating room air without issue with good oxygen saturation. His hemoglobin and hematocrit remained stable. His platelets remained adequate. On postop day 5 we were able to remove his temporary epicardial pacing wires. He is ambulating with limited assistance, his incision is healing well, he is tolerating room air, and he is ready for discharge. Dr. Tyrone Sage approved him being discharged to a facility for 7 days and then finding family to live with. The patient was agreeable.   Consults: cardiology  Significant Diagnostic  Studies: CLINICAL DATA:  CABG.  EXAM: CHEST  2 VIEW  COMPARISON:  03/21/2016 .  FINDINGS: Interim removal of right IJ sheath. Prior CABG. Heart size normal. Low lung volumes with basilar atelectasis. No interim change small right upper lobe calcified pulmonary nodule consistent granuloma. Stable from prior exam . Small bilateral pleural effusions.  IMPRESSION: 1. Interim removal of right IJ sheath.  2. Prior CABG.  Heart size stable.  3. Low lung volumes with persistent bibasilar atelectasis without significant change. Small bilateral pleural effusions.   Electronically Signed   By: Maisie Fus  Register   On: 03/22/2016 06:59  Treatments:   NAME:  Douglas, [name] NO.:  [id number]  MEDICAL RECORD NO.:  [id number]  LOCATION:  B16C                         FACILITY:  [facility]  PHYSICIAN:  Sheliah Plane, MD    DATE OF BIRTH:  30-Oct-1965  DATE OF PROCEDURE:  03/19/2016 DATE OF DISCHARGE:                              OPERATIVE REPORT   PREOPERATIVE DIAGNOSIS:  Coronary occlusive disease with unstable angina.  POSTOPERATIVE DIAGNOSIS:  Coronary occlusive disease with unstable angina.  SURGICAL PROCEDURES:  Coronary artery bypass grafting x5 with left internal mammary to the left anterior descending coronary artery, reverse saphenous vein graft to the diagonal coronary artery, reverse saphenous vein graft to the second obtuse marginal, sequential reverse saphenous vein  graft to the posterior descending and posterolateral branches of the right coronary artery with right greater saphenous vein endo-harvesting, right thigh and calf.  SURGEON:  Sheliah PlaneEdward Gerhardt, MD  FIRST ASSISTANT:  Rowe ClackWayne E. Gold, P.A.-C.  Discharge Exam: Blood pressure 105/66, pulse 95, temperature 98 F (36.7 C), temperature source Oral, resp. rate 20, height 5\' 11"  (1.803 m), weight 153 lb 3.2 oz (69.5 kg), SpO2 100 %.   General appearance: alert, cooperative and  no distress Heart: regular rate and rhythm, S1, S2 normal, no murmur, click, rub or gallop Lungs: clear to auscultation bilaterally Abdomen: soft, non-tender; bowel sounds normal; no masses,  no organomegaly Extremities: extremities normal, atraumatic, no cyanosis or edema Wound: clean and dry. Small amount of clear drainage on the dressing   Disposition: 01-Home or Self Care  Discharge Instructions    Amb Referral to Cardiac Rehabilitation    Complete by:  As directed    Diagnosis:   CABG NSTEMI     CABG X ___:  5   Discharge patient    Complete by:  As directed    Will have 7 days at a facility then will live with family.   Discharge disposition:  70-Another Health Care Institution Not Defined   Discharge patient date:  03/27/2016     Allergies as of 03/27/2016      Reactions   Ibuprofen Nausea And Vomiting, Other (See Comments)   UPSET STOMACH   Shellfish Allergy Nausea And Vomiting      Medication List    STOP taking these medications   ARTHRITIS STRENGTH BC POWDER PO     TAKE these medications   aspirin 81 MG EC tablet Take 1 tablet (81 mg total) by mouth daily. Start taking on:  03/28/2016   atorvastatin 80 MG tablet Commonly known as:  LIPITOR Take 1 tablet (80 mg total) by mouth daily at 6 PM.   diphenhydrAMINE 25 mg capsule Commonly known as:  BENADRYL Take 1 capsule (25 mg total) by mouth every 6 (six) hours as needed for allergies. What changed:  how much to take   metoprolol tartrate 25 MG tablet Commonly known as:  LOPRESSOR Take 0.5 tablets (12.5 mg total) by mouth every 8 (eight) hours.   nicotine 21 mg/24hr patch Commonly known as:  NICODERM CQ - dosed in mg/24 hours Place 1 patch (21 mg total) onto the skin daily. Start taking on:  03/28/2016   Oxycodone HCl 10 MG Tabs Take 1 tablet (10 mg total) by mouth every 6 (six) hours as needed for severe pain.   potassium chloride SA 20 MEQ tablet Commonly known as:  K-DUR,KLOR-CON Take 2 tablets  (40 mEq total) by mouth daily.      Follow-up Information    Delight OvensEdward B Gerhardt, MD Follow up.   Specialty:  Cardiothoracic Surgery Why:  Your appointment is on 04/19/2016 at 1:30pm. Please arrive at 1:00pm for a chest xray at [facility]. They are located on the first floor of our building.  Contact information: [address] [place] [address] [phone]        Georga HackingW Spencer Tilley, MD. Call in 1 day(s).   Specialty:  Cardiology Why:  Please call the office for an appointment in 2-4 weeks.  Contact information: [address] [place] [address] [phone]          The patient has been discharged on:   1.Beta Blocker:  Yes [ x ]  No   [   ]                              If No, reason:  2.Ace Inhibitor/ARB: Yes [   ]                                     No  [  x  ]                                     If No, reason: BP low  3.Statin:   Yes [x   ]                  No  [   ]                  If No, reason:  4.Ecasa:  Yes  [ x  ]                  No   [   ]                  If No, reason:    Signed: Sharlene Dory 03/27/2016, 1:58 PM

## 2016-03-24 LAB — GLUCOSE, CAPILLARY
Glucose-Capillary: 139 mg/dL — ABNORMAL HIGH (ref 65–99)
Glucose-Capillary: 94 mg/dL (ref 65–99)
Glucose-Capillary: 142 mg/dL — ABNORMAL HIGH (ref 65–99)
Glucose-Capillary: 108 mg/dL — ABNORMAL HIGH (ref 65–99)

## 2016-03-24 LAB — CBC
HCT: 32.8 % — ABNORMAL LOW (ref 39.0–52.0)
Hemoglobin: 10.8 g/dL — ABNORMAL LOW (ref 13.0–17.0)
MCH: 31.5 pg (ref 26.0–34.0)
MCHC: 32.9 g/dL (ref 30.0–36.0)
MCV: 95.6 fL (ref 78.0–100.0)
Platelets: 261 10*3/uL (ref 150–400)
RBC: 3.43 MIL/uL — ABNORMAL LOW (ref 4.22–5.81)
RDW: 12.8 % (ref 11.5–15.5)
WBC: 9.5 10*3/uL (ref 4.0–10.5)

## 2016-03-24 LAB — BASIC METABOLIC PANEL
Anion gap: 10 (ref 5–15)
BUN: 6 mg/dL (ref 6–20)
CO2: 29 mmol/L (ref 22–32)
Calcium: 8.2 mg/dL — ABNORMAL LOW (ref 8.9–10.3)
Chloride: 98 mmol/L — ABNORMAL LOW (ref 101–111)
Creatinine, Ser: 0.98 mg/dL (ref 0.61–1.24)
GFR calc Af Amer: >60 mL/min (ref >60)
GFR calc non Af Amer: >60 mL/min (ref >60)
Glucose, Bld: 109 mg/dL — ABNORMAL HIGH (ref 65–99)
Potassium: 3.1 mmol/L — ABNORMAL LOW (ref 3.5–5.1)
Sodium: 137 mmol/L (ref 135–145)

## 2016-03-24 MED ORDER — POTASSIUM CHLORIDE CRYS ER 20 MEQ PO TBCR
40.0000 meq | EXTENDED_RELEASE_TABLET | Freq: Two times a day (BID) | ORAL | Status: DC
  Administered 2016-03-24 – 2016-03-27 (×7): 40 meq via ORAL
  Filled 2016-03-24 (×7): qty 2

## 2016-03-24 NOTE — Progress Notes (Addendum)
      [address]       [place] [id number]             [phone]      5 Days Post-Op Procedure(s) (LRB): CORONARY ARTERY BYPASS GRAFTING (CABG), ON PUMP, TIMES FIVE, USING LEFT INTERNAL MAMMARY ARTERY AND RIGHT GREATER SAPHENOUS VEIN HARVESTED ENDOSCOPICALLY (N/A) TRANSESOPHAGEAL ECHOCARDIOGRAM (TEE) (N/A)   Subjective:  No complaints.  Says he hasn't spoken with social work.  Objective: Vital signs in last 24 hours: Temp:  [98.1 F (36.7 C)-98.2 F (36.8 C)] 98.2 F (36.8 C) (01/13 0456) Pulse Rate:  [85-107] 87 (01/13 1030) Cardiac Rhythm: Normal sinus rhythm (01/13 0745) Resp:  [18] 18 (01/13 0456) BP: (93-120)/(60-71) 94/60 (01/13 1030) SpO2:  [92 %-97 %] 94 % (01/13 0456) Weight:  [155 lb 4.8 oz (70.4 kg)] 155 lb 4.8 oz (70.4 kg) (01/13 0456)  Intake/Output this shift: Total I/O In: 360 [P.O.:360] Out: -   General appearance: alert, cooperative and no distress Heart: regular rate and rhythm Lungs: clear to auscultation bilaterally Abdomen: soft, non-tender; bowel sounds normal; no masses,  no organomegaly Extremities: edema trace Wound: clean and dry  Lab Results:  Recent Labs  03/22/16 0317 03/24/16 0518  WBC 9.8 9.5  HGB 10.2* 10.8*  HCT 30.5* 32.8*  PLT 150 261   BMET:  Recent Labs  03/22/16 0317 03/24/16 0518  NA 132* 137  K 3.8 3.1*  CL 98* 98*  CO2 27 29  GLUCOSE 108* 109*  BUN 8 6  CREATININE 0.94 0.98  CALCIUM 7.7* 8.2*    PT/INR: No results for input(s): LABPROT, INR in the last 72 hours. ABG    Component Value Date/Time   PHART 7.396 03/19/2016 1815   HCO3 27.9 03/19/2016 1815   TCO2 27 03/20/2016 1705   ACIDBASEDEF 4.0 (H) 03/19/2016 1719   O2SAT 96.0 03/19/2016 1815   CBG (last 3)   Recent Labs  03/23/16 1707 03/23/16 2117 03/24/16 0631  GLUCAP 116* 121* 139*    Assessment/Plan: S/P Procedure(s) (LRB): CORONARY ARTERY BYPASS GRAFTING (CABG), ON PUMP, TIMES FIVE, USING LEFT INTERNAL MAMMARY  ARTERY AND RIGHT GREATER SAPHENOUS VEIN HARVESTED ENDOSCOPICALLY (N/A) TRANSESOPHAGEAL ECHOCARDIOGRAM (TEE) (N/A)  1. CV- NSR- Bp labile- continue Lopressor 2. Pulm- no acute issues, continue IS 3. Renal- creatinine WNL, weight is stable, no lasix indicated at this time 4. Deconditioning- has no assistance at discharge, will need SNF, social work consult has been placed 5. Dispo- patient stable, EPW out, ready for discharge, however awaiting social work for SNF placement   LOS: 10 days    Lowella DandyBARRETT, ERIN 03/24/2016   Chart reviewed, patient examined, agree with above.

## 2016-03-24 NOTE — Progress Notes (Signed)
Pacing wires removed per order. Patient instructed on bedrest and q15 VS x 1hr,

## 2016-03-24 NOTE — Progress Notes (Addendum)
Patient ambulating independently without difficulty, instructed to walk 3 times daily today and tomorrow.  He voiced understanding.  SPO2 93-95% RA while walking at 1130. Spot check was done while he was in the hall.

## 2016-03-25 LAB — GLUCOSE, CAPILLARY
GLUCOSE-CAPILLARY: 130 mg/dL — AB (ref 65–99)
GLUCOSE-CAPILLARY: 85 mg/dL (ref 65–99)

## 2016-03-25 MED ORDER — NICOTINE 21 MG/24HR TD PT24
21.0000 mg | MEDICATED_PATCH | Freq: Every day | TRANSDERMAL | Status: DC
  Administered 2016-03-25 – 2016-03-27 (×3): 21 mg via TRANSDERMAL
  Filled 2016-03-25 (×3): qty 1

## 2016-03-25 NOTE — Progress Notes (Addendum)
      [address]       [place] [id number]             [phone]      6 Days Post-Op Procedure(s) (LRB): CORONARY ARTERY BYPASS GRAFTING (CABG), ON PUMP, TIMES FIVE, USING LEFT INTERNAL MAMMARY ARTERY AND RIGHT GREATER SAPHENOUS VEIN HARVESTED ENDOSCOPICALLY (N/A) TRANSESOPHAGEAL ECHOCARDIOGRAM (TEE) (N/A)   Subjective:  States he has some drainage from his sternum.  Had a bad coughing fit.  + ambulation  + BM  Objective: Vital signs in last 24 hours: Temp:  [97.9 F (36.6 C)-98.9 F (37.2 C)] 98.1 F (36.7 C) (01/14 0451) Pulse Rate:  [85-99] 87 (01/14 0451) Cardiac Rhythm: Normal sinus rhythm (01/14 0736) Resp:  [18-19] 18 (01/14 0451) BP: (94-118)/(54-73) 94/54 (01/14 0451) SpO2:  [96 %-99 %] 96 % (01/14 0451) Weight:  [156 lb 11.2 oz (71.1 kg)] 156 lb 11.2 oz (71.1 kg) (01/14 0451)  Intake/Output from previous day: 01/13 0701 - 01/14 0700 In: 600 [P.O.:600] Out: -  Intake/Output this shift: Total I/O In: 240 [P.O.:240] Out: -   General appearance: alert, cooperative and no distress Heart: regular rate and rhythm Lungs: clear to auscultation bilaterally Abdomen: soft, non-tender; bowel sounds normal; no masses,  no organomegaly Extremities: edema trace Wound: sternotomy with mild drainage along inferior portion, mild erythema present, EVH C/D/I  Lab Results:  Recent Labs  03/24/16 0518  WBC 9.5  HGB 10.8*  HCT 32.8*  PLT 261   BMET:  Recent Labs  03/24/16 0518  NA 137  K 3.1*  CL 98*  CO2 29  GLUCOSE 109*  BUN 6  CREATININE 0.98  CALCIUM 8.2*    PT/INR: No results for input(s): LABPROT, INR in the last 72 hours. ABG    Component Value Date/Time   PHART 7.396 03/19/2016 1815   HCO3 27.9 03/19/2016 1815   TCO2 27 03/20/2016 1705   ACIDBASEDEF 4.0 (H) 03/19/2016 1719   O2SAT 96.0 03/19/2016 1815   CBG (last 3)   Recent Labs  03/24/16 1650 03/24/16 2005 03/25/16 0626  GLUCAP 142* 108* 130*     Assessment/Plan: S/P Procedure(s) (LRB): CORONARY ARTERY BYPASS GRAFTING (CABG), ON PUMP, TIMES FIVE, USING LEFT INTERNAL MAMMARY ARTERY AND RIGHT GREATER SAPHENOUS VEIN HARVESTED ENDOSCOPICALLY (N/A) TRANSESOPHAGEAL ECHOCARDIOGRAM (TEE) (N/A)  1. CV- NSR, Bp remains labile- continue Lopressor 2. Pulm- no acute issues, continue IS 3. Renal- WNL, no edema present, no lasix at this time 4. ID- sternal drainage likely fat necrosis, mild erythema present, small bump--- watch for early signs of cellulitis 5. CSW- consult placed, patient is homeless ( crashes with friends, no help at discharge) needs SNF 6. Dispo- patient stable, watch sternal drainage, to SNF when bed available   LOS: 11 days    Lowella DandyBARRETT, ERIN 03/25/2016   Chart reviewed, patient examined, agree with above. He has a raised area in the lower part of the chest incision that has slight erythema and a couple drops of serosanguinous drainage on the dressing. This looks like it is probably an area of fat necrosis or seroma and may need to be opened but will let Dr. Tyrone SageGerhardt decide. He has been afebrile and WBC yesterday was normal. He is mainly stressed because he does not know where he is going to go after he leaves the hospital.

## 2016-03-25 NOTE — Progress Notes (Signed)
CSW spoke to patient at bedside to discuss discharge needs. Patient stated that he is still expecting to discharge to SNF. CSW spoke to patient about the process of an LOG and that unfortunately  he was not approved for LOG. Patient stated he does not have any where to go. CSW asked if patient could go back to friends house or go to ex-in-laws. Patient stated that he was unable too due to family/friends not having any room for him.  Marrianne MoodAshley Meekah Math, MSW,  [employer]WA [phone]

## 2016-03-26 LAB — GLUCOSE, CAPILLARY
GLUCOSE-CAPILLARY: 78 mg/dL (ref 65–99)
Glucose-Capillary: 109 mg/dL — ABNORMAL HIGH (ref 65–99)
Glucose-Capillary: 119 mg/dL — ABNORMAL HIGH (ref 65–99)
Glucose-Capillary: 124 mg/dL — ABNORMAL HIGH (ref 65–99)

## 2016-03-26 NOTE — Progress Notes (Signed)
[id number] Pt has been walking independently with rolling walker in hallway with steady gait. Tolerated well. Encouraged him to go farther each day. Education completed with pt who voiced understanding. Pt stated he cannot quit cold [place]. Stated he is going to ask for Chantix prescription. Gave pt fake cigarette and smoking cessation handout. Encouraged to call 1800quitnow if needed. Encouraged IS. Ex ed given. Gave heart healthy diet and encouraged healthy food choices. Discussed CRP 2 and gave pt financial form and brochure. Will refer to [place] program. Luetta Nuttingharlene Adden Strout RN BSN 03/26/2016 11:13 AM

## 2016-03-26 NOTE — Progress Notes (Addendum)
      [address]       [place] [id number]             [phone]      7 Days Post-Op Procedure(s) (LRB): CORONARY ARTERY BYPASS GRAFTING (CABG), ON PUMP, TIMES FIVE, USING LEFT INTERNAL MAMMARY ARTERY AND RIGHT GREATER SAPHENOUS VEIN HARVESTED ENDOSCOPICALLY (N/A) TRANSESOPHAGEAL ECHOCARDIOGRAM (TEE) (N/A) Subjective: Feels okay. Still does not know where he is going at discahrge  Objective: Vital signs in last 24 hours: Temp:  [98 F (36.7 C)-98.6 F (37 C)] 98 F (36.7 C) (01/15 0438) Pulse Rate:  [90-100] 95 (01/15 0438) Cardiac Rhythm: Normal sinus rhythm (01/15 0732) Resp:  [18-20] 18 (01/15 0438) BP: (104-121)/(64-75) 110/69 (01/15 0438) SpO2:  [94 %-97 %] 94 % (01/15 0438) Weight:  [155 lb 9.6 oz (70.6 kg)] 155 lb 9.6 oz (70.6 kg) (01/15 0438)     Intake/Output from previous day: 01/14 0701 - 01/15 0700 In: 720 [P.O.:720] Out: -  Intake/Output this shift: No intake/output data recorded.  General appearance: alert, cooperative and no distress Heart: regular rate and rhythm, S1, S2 normal, no murmur, click, rub or gallop Lungs: clear to auscultation bilaterally Abdomen: soft, non-tender; bowel sounds normal; no masses,  no organomegaly Extremities: extremities normal, atraumatic, no cyanosis or edema Wound: clean and dry. Small area of erythema on the distal aspect of his sternal incision  Lab Results:  Recent Labs  03/24/16 0518  WBC 9.5  HGB 10.8*  HCT 32.8*  PLT 261   BMET:  Recent Labs  03/24/16 0518  NA 137  K 3.1*  CL 98*  CO2 29  GLUCOSE 109*  BUN 6  CREATININE 0.98  CALCIUM 8.2*    PT/INR: No results for input(s): LABPROT, INR in the last 72 hours. ABG    Component Value Date/Time   PHART 7.396 03/19/2016 1815   HCO3 27.9 03/19/2016 1815   TCO2 27 03/20/2016 1705   ACIDBASEDEF 4.0 (H) 03/19/2016 1719   O2SAT 96.0 03/19/2016 1815   CBG (last 3)   Recent Labs  03/25/16 0626 03/25/16 2116 03/26/16 0608    GLUCAP 130* 85 109*    Assessment/Plan: S/P Procedure(s) (LRB): CORONARY ARTERY BYPASS GRAFTING (CABG), ON PUMP, TIMES FIVE, USING LEFT INTERNAL MAMMARY ARTERY AND RIGHT GREATER SAPHENOUS VEIN HARVESTED ENDOSCOPICALLY (N/A) TRANSESOPHAGEAL ECHOCARDIOGRAM (TEE) (N/A)  1. CV- NSR- Bp labile- continue Lopressor 2. Pulm- no acute issues, continue IS 3. Renal- creatinine WNL, weight is trending down, no lasix indicated at this time 4. Deconditioning- has no assistance at discharge, will need SNF, social work consult has been placed 5. Dispo- patient stable, EPW out, ready for discharge, however awaiting social work for SNF placement. He is currently homeless.  He does has some possible fat necrosis on the distal end of his sternal incision.      LOS: 12 days    Sharlene Doryessa N Conte 03/26/2016  Stable, waiting for placement after d/c  I have seen and examined Douglas EndsJonathan [name] and agree with the above assessment  and plan.  Delight OvensEdward B Lealon Vanputten MD Beeper [phone] Office [phone] 03/26/2016 5:40 PM

## 2016-03-27 LAB — GLUCOSE, CAPILLARY
GLUCOSE-CAPILLARY: 91 mg/dL (ref 65–99)
GLUCOSE-CAPILLARY: 94 mg/dL (ref 65–99)

## 2016-03-27 MED ORDER — ASPIRIN 81 MG PO TBEC: 81.0000 mg | DELAYED_RELEASE_TABLET | Freq: Every day | ORAL | 1 refills | Status: DC

## 2016-03-27 MED ORDER — DIPHENHYDRAMINE HCL 25 MG PO CAPS: 25.0000 mg | ORAL_CAPSULE | Freq: Four times a day (QID) | ORAL | 0 refills | Status: DC | PRN

## 2016-03-27 MED ORDER — POTASSIUM CHLORIDE CRYS ER 20 MEQ PO TBCR: 40.0000 meq | EXTENDED_RELEASE_TABLET | Freq: Every day | ORAL | 0 refills | Status: DC

## 2016-03-27 MED ORDER — ATORVASTATIN CALCIUM 80 MG PO TABS: 80.0000 mg | ORAL_TABLET | Freq: Every day | ORAL | 1 refills | Status: DC

## 2016-03-27 MED ORDER — OXYCODONE HCL 10 MG PO TABS: 10.0000 mg | ORAL_TABLET | Freq: Four times a day (QID) | ORAL | 0 refills | Status: DC | PRN

## 2016-03-27 MED ORDER — METOPROLOL TARTRATE 25 MG PO TABS: 12.5000 mg | ORAL_TABLET | Freq: Three times a day (TID) | ORAL | 1 refills | Status: DC

## 2016-03-27 MED ORDER — NICOTINE 21 MG/24HR TD PT24: 21.0000 mg | MEDICATED_PATCH | Freq: Every day | TRANSDERMAL | 0 refills | Status: DC

## 2016-03-27 NOTE — [place] FL2 (Signed)
[place] MEDICAID FL2 LEVEL OF CARE SCREENING TOOL     IDENTIFICATION  Patient Name: Douglas EndsJonathan Macaluso Birthdate: May 16, 1965 Sex: male Admission Date (Current Location): 03/14/2016  [facility] [place]   Facility and Address:  [facility] [place], [address], [place], [place] [address]      Provider Number: [id number]  Attending Physician Name and Address:  Delight OvensEdward B Gerhardt, MD  Relative Name and Phone Number:       Current Level of Care: Hospital Recommended Level of Care: Skilled Nursing Facility Prior Approval Number:    Date Approved/Denied:   PASRR Number: [id number]  Discharge Plan: SNF    Current Diagnoses: Patient Active Problem List   Diagnosis Date Noted  . S/P CABG x 5 03/19/2016  . CAD (coronary artery disease), native coronary artery 03/15/2016  . Pancreatic divisum 04/18/2015  . History of acute pancreatitis 04/06/2015  . Alcohol abuse 03/09/2015  . Fatty liver 03/09/2015  . NSTEMI (non-ST elevated myocardial infarction) (HCC) 01/18/2014  . Tobacco abuse     Orientation RESPIRATION BLADDER Height & Weight     Self, Time, Situation, Place  Normal Continent Weight: 153 lb 3.2 oz (69.5 kg) Height:  5\' 11"  (180.3 cm)  BEHAVIORAL SYMPTOMS/MOOD NEUROLOGICAL BOWEL NUTRITION STATUS      Continent Diet (Heart Healthy)  AMBULATORY STATUS COMMUNICATION OF NEEDS Skin   Limited Assist Verbally Normal                       Personal Care Assistance Level of Assistance  Bathing, Feeding, Dressing Bathing Assistance: Limited assistance Feeding assistance: Independent Dressing Assistance: Limited assistance     Functional Limitations Info  Sight, Speech, Hearing Sight Info: Adequate Hearing Info: Adequate Speech Info: Adequate    SPECIAL CARE FACTORS FREQUENCY                       Contractures Contractures Info: Not present    Additional Factors Info  Code Status Code Status Info: Full Code              Current Medications (03/27/2016):  This is the current hospital active medication list Current Facility-Administered Medications  Medication Dose Route Frequency Provider Last Rate Last Dose  . 0.9 %  sodium chloride infusion  250 mL Intravenous PRN Delight OvensEdward B Gerhardt, MD      . 0.9 %  sodium chloride infusion  250 mL Intravenous PRN Delight OvensEdward B Gerhardt, MD      . aspirin EC tablet 81 mg  81 mg Oral Daily Delight OvensEdward B Gerhardt, MD   81 mg at 03/27/16 1110  . atorvastatin (LIPITOR) tablet 80 mg  80 mg Oral q1800 Quintella Reichertraci R Turner, MD   80 mg at 03/26/16 1734  . bisacodyl (DULCOLAX) EC tablet 10 mg  10 mg Oral Daily PRN Delight OvensEdward B Gerhardt, MD       Or  . bisacodyl (DULCOLAX) suppository 10 mg  10 mg Rectal Daily PRN Delight OvensEdward B Gerhardt, MD      . diphenhydrAMINE (BENADRYL) capsule 25 mg  25 mg Oral QHS PRN Delight OvensEdward B Gerhardt, MD      . docusate sodium (COLACE) capsule 200 mg  200 mg Oral Daily Delight OvensEdward B Gerhardt, MD   200 mg at 03/27/16 1110  . enoxaparin (LOVENOX) injection 40 mg  40 mg Subcutaneous QHS Delight OvensEdward B Gerhardt, MD   40 mg at 03/26/16 2131  . guaiFENesin (MUCINEX) 12 hr  tablet 600 mg  600 mg Oral Q12H PRN Delight Ovens, MD      . metoprolol tartrate (LOPRESSOR) tablet 12.5 mg  12.5 mg Oral Q8H Delight Ovens, MD   12.5 mg at 03/27/16 0603  . nicotine (NICODERM CQ - dosed in mg/24 hours) patch 21 mg  21 mg Transdermal Daily Alleen Borne, MD   21 mg at 03/27/16 1111  . ondansetron (ZOFRAN) tablet 4 mg  4 mg Oral Q6H PRN Delight Ovens, MD       Or  . ondansetron [facility]) injection 4 mg  4 mg Intravenous Q6H PRN Delight Ovens, MD      . oxyCODONE (Oxy IR/ROXICODONE) immediate release tablet 10 mg  10 mg Oral Q4H PRN Delight Ovens, MD   10 mg at 03/27/16 1110  . pantoprazole (PROTONIX) EC tablet 40 mg  40 mg Oral QAC breakfast Delight Ovens, MD   40 mg at 03/27/16 1610  . potassium chloride SA (K-DUR,KLOR-CON) CR tablet 40 mEq  40 mEq Oral BID Erin R Barrett, PA-C    40 mEq at 03/27/16 1110  . sodium chloride flush (NS) 0.9 % injection 3 mL  3 mL Intravenous Q12H Delight Ovens, MD   3 mL at 03/26/16 2132  . sodium chloride flush (NS) 0.9 % injection 3 mL  3 mL Intravenous PRN Delight Ovens, MD      . sodium chloride flush (NS) 0.9 % injection 3 mL  3 mL Intravenous Q12H Delight Ovens, MD   3 mL at 03/25/16 2203  . sodium chloride flush (NS) 0.9 % injection 3 mL  3 mL Intravenous PRN Delight Ovens, MD      . traMADol Janean Sark) tablet 50 mg  50 mg Oral Q6H PRN Delight Ovens, MD   50 mg at 03/27/16 9604     Discharge Medications: Please see discharge summary for a list of discharge medications.  Relevant Imaging Results:  Relevant Lab Results:   Additional Information SS#[id number]  Althea Charon, LCSW

## 2016-03-27 NOTE — Progress Notes (Signed)
Clinical Social Worker met with patient at bedside to update patient on discharge plan. Patient was approved for an LOG from Pharmacist, hospital for 7 days. CSW reached out to Tammy from [facility]. Tammy stated [facility] is able to take patient but will need a discharge plan after SNF placement. Patient stated that his friend was going to "spot him some money to pay for first months rent" but is unsure if he would be able to get financial assistance that soon. Patient stated that if he is unable to receive financial assistance then he will discharge to homeless shelter. CSW relayed this information to Tammy. CSW remains available for support and discharge needs.  Rhea Pink, MSW,  [place]

## 2016-03-27 NOTE — Care Management Note (Addendum)
Case Management Note Initial Note Created by Jiles CrockerBrenda Chandler 03/15/16  Patient Details  Name: Minerva EndsJonathan Xiong MRN: [id number] Date of Birth: 03-09-1966  Subjective/Objective:    Admitted with NSTEMI                Action/Plan: Patient lives with a friend, no PCP, no medical insurance; Patient stated " I was scheduled to follow up with a heart doctor but I was stupid and didn't." CM talked to patient at length and he is agreeable to make to make lifestyle changes to live a long healthy life. [facility]; he can be followed by their IT traineroc Worker / eligibility program for further assistance; he works 20 - 30 hrs at [employer]; CM will continue to follow for DCP.   Expected Discharge Date:  03/27/16               Expected Discharge Plan:  Skilled Nursing Facility  In-House Referral:  Clinical Social Work  Discharge planning Services  CM Consult  Post Acute Care Choice:  NA Choice offered to:  NA  DME Arranged:    DME Agency:     HH Arranged:    HH Agency:     Status of Service:  Completed, signed off  If discussed at [employer] Length of Stay Meetings, dates discussed:  1/16  Additional Comments:  03/27/16- 1400- Donn PieriniKristi Arlee Santosuosso RN, CM- per DR. A- in LLOS mtg- pt has been approved for LOG-SNF bed- CSW working on placement- per MD pt is stable for d/c today.   Cherylann ParrClaxton, Samantha S, RN,,BSN 03/20/2016, 10:18 AM Pt is now 1 day s/p CABG.  CM spoke with pt directly - pt alert and oriented.  Pt confirmed he will not have anyone to be with him post discharge as recommended post surgery - pt is in agreement for SNF at discharge.  CSW consulted.  In house financial counselor consulted and following   Darrold SpanWebster, Dimitriy Carreras Hall, RN 03/27/2016, 2:10 PM [phone]

## 2016-03-27 NOTE — Progress Notes (Addendum)
      [address]       [place] [id number]             [phone]      8 Days Post-Op Procedure(s) (LRB): CORONARY ARTERY BYPASS GRAFTING (CABG), ON PUMP, TIMES FIVE, USING LEFT INTERNAL MAMMARY ARTERY AND RIGHT GREATER SAPHENOUS VEIN HARVESTED ENDOSCOPICALLY (N/A) TRANSESOPHAGEAL ECHOCARDIOGRAM (TEE) (N/A) Subjective: Events from yesterday noted. The patient slipped while wearing his own socks and grabbed the counter. His chest hurt after this but the pain medication helped.    Objective: Vital signs in last 24 hours: Temp:  [98 F (36.7 C)-98.5 F (36.9 C)] 98 F (36.7 C) (01/16 0431) Pulse Rate:  [85-99] 95 (01/16 0603) Cardiac Rhythm: Normal sinus rhythm (01/15 1900) Resp:  [20-21] 20 (01/16 0431) BP: (105-121)/(61-72) 105/66 (01/16 0603) SpO2:  [93 %-100 %] 100 % (01/16 0431) Weight:  [153 lb 3.2 oz (69.5 kg)] 153 lb 3.2 oz (69.5 kg) (01/16 0431)     Intake/Output from previous day: 01/15 0701 - 01/16 0700 In: 600 [P.O.:600] Out: -  Intake/Output this shift: No intake/output data recorded.  General appearance: alert, cooperative and no distress Heart: regular rate and rhythm, S1, S2 normal, no murmur, click, rub or gallop Lungs: clear to auscultation bilaterally Abdomen: soft, non-tender; bowel sounds normal; no masses,  no organomegaly Extremities: extremities normal, atraumatic, no cyanosis or edema Wound: clean and dry. Small amount of clear drainage on the dressing   Lab Results: No results for input(s): WBC, HGB, HCT, PLT in the last 72 hours. BMET: No results for input(s): NA, K, CL, CO2, GLUCOSE, BUN, CREATININE, CALCIUM in the last 72 hours.  PT/INR: No results for input(s): LABPROT, INR in the last 72 hours. ABG    Component Value Date/Time   PHART 7.396 03/19/2016 1815   HCO3 27.9 03/19/2016 1815   TCO2 27 03/20/2016 1705   ACIDBASEDEF 4.0 (H) 03/19/2016 1719   O2SAT 96.0 03/19/2016 1815   CBG (last 3)   Recent Labs  03/26/16 1659 03/26/16 2113 03/27/16 0621  GLUCAP 78 124* 91    Assessment/Plan: S/P Procedure(s) (LRB): CORONARY ARTERY BYPASS GRAFTING (CABG), ON PUMP, TIMES FIVE, USING LEFT INTERNAL MAMMARY ARTERY AND RIGHT GREATER SAPHENOUS VEIN HARVESTED ENDOSCOPICALLY (N/A) TRANSESOPHAGEAL ECHOCARDIOGRAM (TEE) (N/A)  1. CV- NSR- Bp better- continue Lopressor 2. Pulm- no acute issues, continue Is. Tolerating room air.  3. Renal- creatinine WNL, weight is trending down, no lasix indicated at this time 4. Deconditioning- has no assistance at discharge, will need SNF, social work consult has been placed 5. Dispo- patient stable, EPW out, ready for discharge, however awaiting social work for SNF placement or other alternative. He is currently homeless. Distal incision looks okay without indication for debridement.    LOS: 13 days    Sharlene Doryessa N Conte 03/27/2016     plan snf today  I have seen and examined Douglas EndsJonathan [name] and agree with the above assessment  and plan.  Delight OvensEdward B Asuncion Shibata MD Beeper [phone] Office [phone] 03/27/2016 2:24 PM

## 2016-03-27 NOTE — Progress Notes (Signed)
Clinical Social Worker facilitated patient discharge including contacting patient family and facility to confirm patient discharge plans.  Clinical information faxed to facility and family agreeable with plan.   stated that his friend Onalee HuaDavid will be taking him to facility after discharge.  RN Rosey Batheresa to call [phone] for report prior to discharge.  Clinical Social Worker will sign off for now as social work intervention is no longer needed. Please consult us again if new need arises.  Marrianne MoodAshley Breslin Burklow, MSW, [employer] [phone]

## 2016-03-27 NOTE — Clinical Social Work Placement (Signed)
   CLINICAL SOCIAL WORK PLACEMENT  NOTE  Date:  03/27/2016  Patient Details  Name: Minerva EndsJonathan [place]a MRN: [id number] Date of Birth: 11-04-1965  Clinical Social Work is seeking post-discharge placement for this patient at the Skilled  Nursing Facility level of care (*CSW will initial, date and re-position this form in  chart as items are completed):  Yes   Patient/family provided with [place] Clinical Social Work Department's list of facilities offering this level of care within the geographic area requested by the patient (or if unable, by the patient's family).  Yes   Patient/family informed of their freedom to choose among providers that offer the needed level of care, that participate in Medicare, Medicaid or managed care program needed by the patient, have an available bed and are willing to accept the patient.  Yes   Patient/family informed of [facility]'s ownership interest in [facility] and [facility], as well as of the fact that they are under no obligation to receive care at these facilities.  PASRR submitted to EDS on 03/27/16     PASRR number received on 03/27/16     Existing PASRR number confirmed on       FL2 transmitted to all facilities in geographic area requested by pt/family on       FL2 transmitted to all facilities within larger geographic area on       Patient informed that his/her managed care company has contracts with or will negotiate with certain facilities, including the following:            Patient/family informed of bed offers received.  Patient chooses bed at       Physician recommends and patient chooses bed at      Patient to be transferred to   on  .  Patient to be transferred to facility by       Patient family notified on   of transfer.  Name of family member notified:        PHYSICIAN Please sign FL2     Additional Comment:    _______________________________________________ Althea CharonAshley C Suhaylah Wampole, LCSW 03/27/2016, 12:18  PM

## 2016-03-28 ENCOUNTER — Other Ambulatory Visit: Payer: Self-pay | Admitting: *Deleted

## 2016-03-28 MED ORDER — OXYCODONE HCL 10 MG PO TABS: 10.0000 mg | ORAL_TABLET | Freq: Four times a day (QID) | ORAL | 0 refills | Status: DC | PRN

## 2016-03-28 NOTE — Telephone Encounter (Signed)
[facility] #[phone] Fax:[phone]  

## 2016-03-29 ENCOUNTER — Telehealth: Payer: Self-pay | Admitting: Physician Assistant

## 2016-03-29 NOTE — Telephone Encounter (Signed)
      [address]       [place] [id number]             [phone]    Minerva EndsJonathan Orsini [id number]   Attempted to call Mr. Lisa RocaGillett at 2:45pm on 03/29/2016 without response. I did not leave a message. I will attempt to call Mr. Lisa RocaGillett at a later date.   Jari Favreessa Burak Zerbe, PA-C

## 2016-04-02 ENCOUNTER — Non-Acute Institutional Stay (SKILLED_NURSING_FACILITY): Payer: Self-pay | Admitting: Internal Medicine

## 2016-04-02 ENCOUNTER — Telehealth: Payer: Self-pay | Admitting: Surgical

## 2016-04-02 ENCOUNTER — Encounter: Payer: Self-pay | Admitting: Internal Medicine

## 2016-04-02 DIAGNOSIS — R946 Abnormal results of thyroid function studies: Secondary | ICD-10-CM

## 2016-04-02 DIAGNOSIS — K861 Other chronic pancreatitis: Secondary | ICD-10-CM

## 2016-04-02 DIAGNOSIS — Z720 Tobacco use: Secondary | ICD-10-CM

## 2016-04-02 DIAGNOSIS — I25118 Atherosclerotic heart disease of native coronary artery with other forms of angina pectoris: Secondary | ICD-10-CM

## 2016-04-02 DIAGNOSIS — F1010 Alcohol abuse, uncomplicated: Secondary | ICD-10-CM

## 2016-04-02 DIAGNOSIS — Z951 Presence of aortocoronary bypass graft: Secondary | ICD-10-CM

## 2016-04-02 DIAGNOSIS — D649 Anemia, unspecified: Secondary | ICD-10-CM

## 2016-04-02 DIAGNOSIS — R7989 Other specified abnormal findings of blood chemistry: Secondary | ICD-10-CM

## 2016-04-02 NOTE — Telephone Encounter (Signed)
      [address]       [name] [id number]             [phone]     Attempted to call patient to see how he was doing since discharge. There was no answer at this time.   GOLD,WAYNE E, PA-C

## 2016-04-02 NOTE — Progress Notes (Signed)
Patient ID: Douglas [name], male   DOB: 1965/04/08, 51 y.o.   MRN: [id number]    HISTORY AND PHYSICAL   DATE: 04/02/2016  Location:     [place] Room Number: 938 B Place of Service: SNF (31)   Extended Emergency Contact Information Primary Emergency Contact: Lenard Galloway [place] Phone: [phone] Mobile Phone: [phone] Relation: Friend Secondary Emergency Contact: Percell Locus [place] of [place] Phone: [phone] Mobile Phone: [phone] Relation: Friend  Advanced Directive information Does Patient Have a Medical Advance Directive?: No, Would patient like information on creating a medical advance directive?: No - Patient declined, Does patient want to make changes to medical advance directive?: No - Patient declined  Chief Complaint  Patient presents with  . New Admit To SNF    HPI:  51 yo male seen today as a new admission into SNF following hospital stay for NSTEMI, CAD with hx CABG, Etoh abuse, pancreatitis, tobacco abuse, PTSD. He underwent 5 vessel CABG on 1/8th by Dr Servando Snare. No immediate postop complications. He was d/c'd on BB, statin and ASA tx. NO ACEI due to low BPs. Hgb 15.7 -->10.8; albumin 3.2; A1c 5.3%; Cr 0.98; K 3.1; Trp as high as 0.12; TSH 4.546; LDL 89 at d/c. He presents to SNF for short term rehab.  Today he reports feeling well overall. He had chest discomfort last night but it resolved on its own. He states drainage from surgical sites have stopped. No SOB or palpitations. He has right lower rib cage pain with deep inspiration.   Etoh abuse - he states he quit the day he went into the hospital  Chronic pancreatitis - due to Etoh abuse.  Tobacco abuse - he quit smoking the day of admission  Past Medical History:  Diagnosis Date  . Anginal pain ([place])   . Coronary artery disease   . GERD (gastroesophageal reflux disease)   . History of acute pancreatitis 04/06/2015  . NSTEMI (non-ST  elevated myocardial infarction) ([place]) 01/2014  . Pancreatitis   . PTSD (post-traumatic stress disorder)   . Tobacco abuse     Past Surgical History:  Procedure Laterality Date  . CARDIAC CATHETERIZATION N/A 03/14/2016   Procedure: Left Heart Cath and Coronary Angiography;  Surgeon: Burnell Blanks, MD;  Location: [facility] CV LAB;  Service: Cardiovascular;  Laterality: N/A;  . CORONARY ANGIOPLASTY WITH STENT PLACEMENT  01/18/2014   "1"  . CORONARY ARTERY BYPASS GRAFT N/A 03/19/2016   Procedure: CORONARY ARTERY BYPASS GRAFTING (CABG), ON PUMP, TIMES FIVE, USING LEFT INTERNAL MAMMARY ARTERY AND RIGHT GREATER SAPHENOUS VEIN HARVESTED ENDOSCOPICALLY;  Surgeon: Grace Isaac, MD;  Location: [facility];  Service: Open Heart Surgery;  Laterality: N/A;  LIMA-LAD SVG-OM SVG-DIAG SEQ SVG-PD-PL  . LEFT HEART CATHETERIZATION WITH CORONARY ANGIOGRAM N/A 01/18/2014   Procedure: LEFT HEART CATHETERIZATION WITH CORONARY ANGIOGRAM;  Surgeon: Lorretta Harp, MD;  Location: [facility] CATH LAB;  Service: Cardiovascular;  Laterality: N/A;  . TEE WITHOUT CARDIOVERSION N/A 03/19/2016   Procedure: TRANSESOPHAGEAL ECHOCARDIOGRAM (TEE);  Surgeon: Grace Isaac, MD;  Location: [facility];  Service: Open Heart Surgery;  Laterality: N/A;    Patient Care Team: No Pcp Per Patient as PCP - General (General Practice)  Social History   Social History  . Marital status: Divorced    Spouse name: N/A  . Number of children: 2  . Years of education: N/A   Occupational History  . [employer]    Social History Main Topics  .  Smoking status: Current Every Day Smoker    Packs/day: 1.00    Years: 31.00    Types: Cigarettes  . Smokeless tobacco: Never Used  . Alcohol use 0.6 oz/week    1 Cans of beer per week  . Drug use: No     Comment: denies 06/17/2015  . Sexual activity: Not Currently   Other Topics Concern  . Not on file   Social History Narrative   Divorced.  2 sons.  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     reports that he has been smoking Cigarettes.  He has a 31.00 pack-year smoking history. He has never used smokeless tobacco. He reports that he drinks about 0.6 oz of alcohol per week . He reports that he does not use drugs.  Family History  Problem Relation Age of Onset  . Cancer Mother     Lung cancer  . Coronary artery disease Mother   . Heart failure Maternal Grandmother    Family Status  Relation Status  . Father    muscular dystrophy  . Mother Deceased   dementia/lung cancer  . Sister Alive  . Maternal Grandmother     Immunization History  Administered Date(s) Administered  . Pneumococcal Polysaccharide-23 04/17/2015    Allergies  Allergen Reactions  . Ibuprofen Nausea And Vomiting and Other (See Comments)    UPSET STOMACH  . Shellfish Allergy Nausea And Vomiting    Medications: Patient's Medications  New Prescriptions   No medications on file  Previous Medications   ASPIRIN EC 81 MG EC TABLET    Take 1 tablet (81 mg total) by mouth daily.   ATORVASTATIN (LIPITOR) 80 MG TABLET    Take 1 tablet (80 mg total) by mouth daily at 6 PM.   DIPHENHYDRAMINE (BENADRYL) 25 MG CAPSULE    Take 1 capsule (25 mg total) by mouth every 6 (six) hours as needed for allergies.   METOPROLOL TARTRATE (LOPRESSOR) 25 MG TABLET    Take 12.5 mg by mouth daily.   NICOTINE (NICODERM CQ - DOSED IN MG/24 HOURS) 21 MG/24HR PATCH    Place 1 patch (21 mg total) onto the skin daily.   OXYCODONE HCL 10 MG TABS    Take 1 tablet (10 mg total) by mouth every 6 (six) hours as needed. For pain   POTASSIUM CHLORIDE SA (K-DUR,KLOR-CON) 20 MEQ TABLET    Take 2 tablets (40 mEq total) by mouth daily.  Modified Medications   No medications on file  Discontinued Medications   METOPROLOL TARTRATE (LOPRESSOR) 25 MG TABLET    Take 0.5 tablets (12.5 mg total) by mouth every 8 (eight) hours.    Review of Systems  Cardiovascular: Positive for chest pain.  Musculoskeletal: Positive  for arthralgias.  All other systems reviewed and are negative.   Vitals:   04/02/16 0902  BP: 117/73  Pulse: 93  Resp: 20  Temp: 97.6 F (36.4 C)  TempSrc: Oral  SpO2: 96%  Weight: 157 lb (71.2 kg)  Height: '5\' 11"'$  (1.803 m)   Body mass index is 21.9 kg/m.  Physical Exam  Constitutional: He is oriented to person, place, and time. He appears well-developed and well-nourished.  HENT:  Mouth/Throat: Oropharynx is clear and moist.  Eyes: Pupils are equal, round, and reactive to light. No scleral icterus.  Neck: Neck supple. Carotid bruit is not present. No thyromegaly present.  Cardiovascular: Normal rate, regular rhythm and intact distal pulses.  Exam reveals no  gallop and no friction rub.   Murmur (1/6 SEM) heard. no distal LE swelling. No calf TTP  Pulmonary/Chest: Effort normal and breath sounds normal. He has no wheezes. He has no rales. He exhibits tenderness (midline surgical incision intact and without d/c, redness or swelling).  Abdominal: Soft. Bowel sounds are normal. He exhibits no distension, no abdominal bruit, no pulsatile midline mass and no mass. There is no hepatomegaly. There is no tenderness. There is no rebound and no guarding.  Upper abdominal wall incision x 2 steri strips intact with no redness, d/c or swelling  Musculoskeletal: He exhibits tenderness (right rib 7-9 stuck up laterally and TTP).  Lymphadenopathy:    He has no cervical adenopathy.  Neurological: He is alert and oriented to person, place, and time.  Skin: Skin is warm and dry. No rash noted.  Psychiatric: He has a normal mood and affect. His behavior is normal. Judgment and thought content normal.     Labs reviewed: Admission on 03/14/2016, Discharged on 03/27/2016  No results displayed because visit has over 200 results.    Admission on 01/28/2016, Discharged on 01/29/2016  Component Date Value Ref Range Status  . Color, Urine 01/29/2016 YELLOW  YELLOW Final  . APPearance 01/29/2016  CLEAR  CLEAR Final  . Specific Gravity, Urine 01/29/2016 1.016  1.005 - 1.030 Final  . pH 01/29/2016 5.5  5.0 - 8.0 Final  . Glucose, UA 01/29/2016 NEGATIVE  NEGATIVE mg/dL Final  . Hgb urine dipstick 01/29/2016 NEGATIVE  NEGATIVE Final  . Bilirubin Urine 01/29/2016 NEGATIVE  NEGATIVE Final  . Ketones, ur 01/29/2016 NEGATIVE  NEGATIVE mg/dL Final  . Protein, ur 01/29/2016 NEGATIVE  NEGATIVE mg/dL Final  . Nitrite 01/29/2016 NEGATIVE  NEGATIVE Final  . Leukocytes, UA 01/29/2016 NEGATIVE  NEGATIVE Final  . Sodium 01/29/2016 136  135 - 145 mmol/L Final  . Potassium 01/29/2016 4.0  3.5 - 5.1 mmol/L Final  . Chloride 01/29/2016 105  101 - 111 mmol/L Final  . CO2 01/29/2016 22  22 - 32 mmol/L Final  . Glucose, Bld 01/29/2016 101* 65 - 99 mg/dL Final  . BUN 01/29/2016 13  6 - 20 mg/dL Final  . Creatinine, Ser 01/29/2016 1.12  0.61 - 1.24 mg/dL Final  . Calcium 01/29/2016 8.8* 8.9 - 10.3 mg/dL Final  . Total Protein 01/29/2016 6.7  6.5 - 8.1 g/dL Final  . Albumin 01/29/2016 4.1  3.5 - 5.0 g/dL Final  . AST 01/29/2016 23  15 - 41 U/L Final  . ALT 01/29/2016 17  17 - 63 U/L Final  . Alkaline Phosphatase 01/29/2016 78  38 - 126 U/L Final  . Total Bilirubin 01/29/2016 0.6  0.3 - 1.2 mg/dL Final  . GFR calc non Af Amer 01/29/2016 >60  >60 mL/min Final  . GFR calc Af Amer 01/29/2016 >60  >60 mL/min Final   Comment: (NOTE) The eGFR has been calculated using the CKD EPI equation. This calculation has not been validated in all clinical situations. eGFR's persistently <60 mL/min signify possible Chronic Kidney Disease.   . Anion gap 01/29/2016 9  5 - 15 Final  . Lipase 01/29/2016 27  11 - 51 U/L Final  . WBC 01/29/2016 9.3  4.0 - 10.5 K/uL Final  . RBC 01/29/2016 4.97  4.22 - 5.81 MIL/uL Final  . Hemoglobin 01/29/2016 15.8  13.0 - 17.0 g/dL Final  . HCT 01/29/2016 45.6  39.0 - 52.0 % Final  . MCV 01/29/2016 91.8  78.0 - 100.0 fL Final  .  MCH 01/29/2016 31.8  26.0 - 34.0 pg Final  . MCHC  01/29/2016 34.6  30.0 - 36.0 g/dL Final  . RDW 97/37/6223 14.2  11.5 - 15.5 % Final  . Platelets 01/29/2016 220  150 - 400 K/uL Final  . Neutrophils Relative % 01/29/2016 60  % Final  . Neutro Abs 01/29/2016 5.7  1.7 - 7.7 K/uL Final  . Lymphocytes Relative 01/29/2016 26  % Final  . Lymphs Abs 01/29/2016 2.4  0.7 - 4.0 K/uL Final  . Monocytes Relative 01/29/2016 9  % Final  . Monocytes Absolute 01/29/2016 0.8  0.1 - 1.0 K/uL Final  . Eosinophils Relative 01/29/2016 4  % Final  . Eosinophils Absolute 01/29/2016 0.3  0.0 - 0.7 K/uL Final  . Basophils Relative 01/29/2016 1  % Final  . Basophils Absolute 01/29/2016 0.1  0.0 - 0.1 K/uL Final    Dg Chest 2 View  Result Date: 03/22/2016 CLINICAL DATA:  CABG. EXAM: CHEST  2 VIEW COMPARISON:  03/21/2016 . FINDINGS: Interim removal of right IJ sheath. Prior CABG. Heart size normal. Low lung volumes with basilar atelectasis. No interim change small right upper lobe calcified pulmonary nodule consistent granuloma. Stable from prior exam . Small bilateral pleural effusions. IMPRESSION: 1. Interim removal of right IJ sheath. 2. Prior CABG.  Heart size stable. 3. Low lung volumes with persistent bibasilar atelectasis without significant change. Small bilateral pleural effusions. Electronically Signed   By: Maisie Fus  Register   On: 03/22/2016 06:59   Dg Chest 2 View  Result Date: 03/14/2016 CLINICAL DATA:  Central chest pain with dyspnea, onset last night. EXAM: CHEST  2 VIEW COMPARISON:  08/05/2015 FINDINGS: Moderate hyperinflation. Scattered calcified granulomatous changes. The lungs are otherwise clear. The pulmonary vasculature is normal. There is no pleural effusion. Hilar and mediastinal contours are unremarkable and unchanged. Heart size is normal. IMPRESSION: Hyperinflation. Stable granulomatous changes. No consolidation or effusion. Electronically Signed   By: Ellery Plunk M.D.   On: 03/14/2016 01:45   Ct Abdomen W Contrast  Result Date:  03/16/2016 CLINICAL DATA:  History of pancreatitis.  Possible pancreatic mass. EXAM: CT ABDOMEN WITH CONTRAST TECHNIQUE: Multidetector CT imaging of the abdomen was performed using the standard protocol following bolus administration of intravenous contrast. CONTRAST:  ISOVUE-300 IOPAMIDOL (ISOVUE-300) INJECTION 61% COMPARISON:  CT scan 06/17/2015 and MRI 04/16/2015 FINDINGS: Lower chest: Emphysematous changes noted and minimal bibasilar atelectasis. No infiltrates or effusions. No worrisome pulmonary lesions. The heart is normal in size. No pericardial effusion. Coronary artery calcifications are noted. The distal esophagus is grossly normal. Hepatobiliary: Stable mild intrahepatic biliary dilatation and mild dilatation of the common bile duct. No common bile duct stones. The gallbladder appears normal. No focal hepatic lesions. Pancreas: Some persistent low-attenuation and strandy ill-defined density between the pancreatic head in the duodenum. 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 This was much more significant on the CT scan from 03/05/2015. No findings for acute pancreatitis. No pancreatic ductal dilatation. Spleen: Normal size.  No focal lesions. Adrenals/Urinary Tract: The adrenal glands and kidneys are unremarkable and stable. No renal or ureteral calculi. No bladder calculi. No renal or bladder mass. Stomach/Bowel: PE stomach, duodenum, small bowel and colon are unremarkable. No inflammatory changes, mass lesions or obstructive findings. The terminal ileum is normal. The appendix is normal. Vascular/Lymphatic: Stable moderate atherosclerotic calcifications involving the iliac arteries. Minimal scattered aortic calcifications. No aneurysm or dissection. The branch vessels are patent. The major venous structures are patent. Small scattered mesenteric and retroperitoneal  lymph nodes are stable. No mass or overt adenopathy. Other: The prostate gland and seminal vesicles are unremarkable. No  pelvic mass or adenopathy. No free pelvic fluid collections. No inguinal mass or adenopathy. Musculoskeletal: No significant bony findings. Incidental unilateral pars defect on the left at L5. IMPRESSION: 1. 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 Chronic inflammation between the pancreatic head and duodenum but overall much improved since prior studies. No pancreatic mass is identified. 2. Stable mild chronic intra and extrahepatic biliary dilatation likely due to distal common bile duct stricture/narrowing due to chronic pancreatitis. 3. Age advanced atherosclerotic calcifications involving the iliac arteries. Electronically Signed   By: Marijo Sanes M.D.   On: 03/16/2016 07:56   Dg Chest Port 1 View  Result Date: 03/21/2016 CLINICAL DATA:  Coronary artery bypass . EXAM: PORTABLE CHEST 1 VIEW COMPARISON:  03/20/2016 . FINDINGS: Right IJ sheath noted in stable position. Interim removal of mediastinal drainage catheter, Swan-Ganz catheter, left chest tube. Mild bibasilar atelectasis. Prior CABG. No from pleural effusion or pneumothorax. IMPRESSION: 1. Interim removal of mediastinal drainage catheter, Swan-Ganz catheter, left chest tube. No pneumothorax. Right IJ sheath in stable position. 2. Prior CABG.  Heart size stable. 3. Low lung volumes with basilar atelectasis again noted. Electronically Signed   By: Marcello Moores  Register   On: 03/21/2016 08:34   Dg Chest Port 1 View  Result Date: 03/20/2016 CLINICAL DATA:  Status post coronary bypass grafting EXAM: PORTABLE CHEST 1 VIEW COMPARISON:  03/19/2016 FINDINGS: Cardiac shadow is stable. The endotracheal tube and nasogastric catheter have been removed in the interval. Swan-Ganz catheter, mediastinal drain and left thoracostomy catheter are again noted and stable. Mild bibasilar atelectasis is seen. The inspiratory effort is less than that seen on prior exam. Stable calcification in the right lung is noted. IMPRESSION: Mild bibasilar  atelectasis likely related to poor inspiratory effort. Tubes and lines as described. Electronically Signed   By: Inez Catalina M.D.   On: 03/20/2016 07:10   Dg Chest Port 1 View  Result Date: 03/19/2016 CLINICAL DATA:  Coronary artery disease.  Status post CABG. EXAM: PORTABLE CHEST 1 VIEW COMPARISON:  03/14/2016 FINDINGS: Endotracheal tube, NG tube and chest tubes and Swan-Ganz catheter all appear in good position. No pneumothorax. No effusions. Minimal atelectasis in the right midzone and at the left lung base. IMPRESSION: Satisfactory appearance of the chest after CABG. Minimal atelectasis. Electronically Signed   By: Lorriane Shire M.D.   On: 03/19/2016 14:22     Assessment/Plan   ICD-9-CM ICD-10-CM   1. S/P CABG x 5 - surgical sites healing V45.81 Z95.1   2. Coronary artery disease involving native coronary artery of native heart with other form of angina pectoris ([place]) 414.01 I25.118    413.9     s/p recent NSTEMI  3. Alcohol abuse - recently quit 305.00 F10.10   4. Tobacco abuse - recently quit 305.1 Z72.0   5. Chronic pancreatitis, unspecified pancreatitis type ([place]) - due to #3 577.1 K86.1   6. Anemia, unspecified type 285.9 D64.9    likely related to recent CABG  7.      Abnormal TSH - NEW but likely due to euthyroid sick syndrome                    790.6         R94.6   Repeat TSH in 4 weeks  Cont current meds as ordered  PT/OT/ST as ordered  f/u with specialists as scheduled  Congratulated him on smoking cessation. Encouraged him to remain Etoh free also.  GOAL: short term rehab and d/c home when medically appropriate. Communicated with pt and nursing.  Will follow  Liberta Gimpel S. Perlie Gold  [facility] [address] [place], [place] [address] [phone] Cell (Monday-Friday 8 AM - 5 PM) [phone] After 5 PM and follow prompts

## 2016-04-03 ENCOUNTER — Non-Acute Institutional Stay (SKILLED_NURSING_FACILITY): Payer: Self-pay | Admitting: Adult Health

## 2016-04-03 ENCOUNTER — Encounter: Payer: Self-pay | Admitting: Adult Health

## 2016-04-03 DIAGNOSIS — Q453 Other congenital malformations of pancreas and pancreatic duct: Secondary | ICD-10-CM

## 2016-04-03 DIAGNOSIS — Z951 Presence of aortocoronary bypass graft: Secondary | ICD-10-CM

## 2016-04-03 DIAGNOSIS — I2510 Atherosclerotic heart disease of native coronary artery without angina pectoris: Secondary | ICD-10-CM

## 2016-04-03 DIAGNOSIS — I214 Non-ST elevation (NSTEMI) myocardial infarction: Secondary | ICD-10-CM

## 2016-04-03 NOTE — Progress Notes (Signed)
Location:   [facility] Room Number: 123b Place of Service:  SNF (31)    CODE STATUS: full code   Allergies  Allergen Reactions  . Ibuprofen Nausea And Vomiting and Other (See Comments)    UPSET STOMACH  . Shellfish Allergy Nausea And Vomiting   Chief Complaint  Patient presents with  . Discharge Note    HPI:  He is being discharged when his housing has been arranged. He will not need home health and does not require dme. He will need his prescriptions to be written and will need to follow up with his medical provider.  He has been hospitalized for cabg X5. He is independent with his adls at this time.   Past Medical History:  Diagnosis Date  . Anginal pain ([facility])   . Coronary artery disease   . GERD (gastroesophageal reflux disease)   . History of acute pancreatitis 04/06/2015  . NSTEMI (non-ST elevated myocardial infarction) ([facility]) 01/2014  . Pancreatitis   . PTSD (post-traumatic stress disorder)   . Tobacco abuse     Past Surgical History:  Procedure Laterality Date  . CARDIAC CATHETERIZATION N/A 03/14/2016   Procedure: Left Heart Cath and Coronary Angiography;  Surgeon: Kathleene Hazel, MD;  Location: [facility] INVASIVE CV LAB;  Service: Cardiovascular;  Laterality: N/A;  . CORONARY ANGIOPLASTY WITH STENT PLACEMENT  01/18/2014   "1"  . CORONARY ARTERY BYPASS GRAFT N/A 03/19/2016   Procedure: CORONARY ARTERY BYPASS GRAFTING (CABG), ON PUMP, TIMES FIVE, USING LEFT INTERNAL MAMMARY ARTERY AND RIGHT GREATER SAPHENOUS VEIN HARVESTED ENDOSCOPICALLY;  Surgeon: Delight Ovens, MD;  Location: [facility] OR;  Service: Open Heart Surgery;  Laterality: N/A;  LIMA-LAD SVG-OM SVG-DIAG SEQ SVG-PD-PL  . LEFT HEART CATHETERIZATION WITH CORONARY ANGIOGRAM N/A 01/18/2014   Procedure: LEFT HEART CATHETERIZATION WITH CORONARY ANGIOGRAM;  Surgeon: Runell Gess, MD;  Location: [facility] CATH LAB;  Service: Cardiovascular;  Laterality: N/A;  . TEE WITHOUT CARDIOVERSION N/A 03/19/2016   Procedure: TRANSESOPHAGEAL ECHOCARDIOGRAM (TEE);  Surgeon: Delight Ovens, MD;  Location[name] OR;  Service: Open Heart Surgery;  Laterality: N/A;    Social History   Social History  . Marital status: Divorced    Spouse name: N/A  . Number of children: 2  . Years of education: N/A   Occupational History  . [employer]    Social History Main Topics  . Smoking status: Current Every Day Smoker    Packs/day: 1.00    Years: 31.00    Types: Cigarettes  . Smokeless tobacco: Never Used  . Alcohol use 0.6 oz/week    1 Cans of beer per week  . Drug use: No     Comment: denies 06/17/2015  . Sexual activity: Not Currently   Other Topics Concern  . Not on file   Social History Narrative   Divorced.  2 sons.  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   Family History  Problem Relation Age of Onset  . Cancer Mother     Lung cancer  . Coronary artery disease Mother   . Heart failure Maternal Grandmother     VITAL SIGNS BP 117/73   Pulse 93   Temp 97.6 F (36.4 C)   Resp 18   Ht 5\' 11"  (1.803 m)   Wt 157 lb (71.2 kg)   SpO2 96%   BMI 21.90 kg/m   Patient's Medications  New Prescriptions   No medications on file  Previous Medications  ASPIRIN EC 81 MG EC TABLET    Take 1 tablet (81 mg total) by mouth daily.   ATORVASTATIN (LIPITOR) 80 MG TABLET    Take 1 tablet (80 mg total) by mouth daily at 6 PM.   DIPHENHYDRAMINE (BENADRYL) 25 MG CAPSULE    Take 1 capsule (25 mg total) by mouth every 6 (six) hours as needed for allergies.   METOPROLOL TARTRATE (LOPRESSOR) 25 MG TABLET    Take 12.5 mg by mouth daily.   NICOTINE (NICODERM CQ - DOSED IN MG/24 HOURS) 21 MG/24HR PATCH    Place 1 patch (21 mg total) onto the skin daily.   OXYCODONE HCL 10 MG TABS    Take 1 tablet (10 mg total) by mouth every 6 (six) hours as needed. For pain   POTASSIUM CHLORIDE SA (K-DUR,KLOR-CON) 20 MEQ TABLET    Take 2 tablets (40 mEq total) by mouth daily.  Modified Medications    No medications on file  Discontinued Medications   No medications on file     SIGNIFICANT DIAGNOSTIC EXAMS   Review of Systems  Constitutional: Negative for malaise/fatigue.  Respiratory: Negative for cough and shortness of breath.   Cardiovascular: Negative for chest pain, palpitations and leg swelling.  Gastrointestinal: Negative for abdominal pain, constipation and heartburn.  Musculoskeletal: Negative for back pain, joint pain and myalgias.  Skin: Negative.   Neurological: Negative for dizziness.  Psychiatric/Behavioral: The patient is not nervous/anxious.     Physical Exam  Constitutional: He is oriented to person, place, and time. No distress.  Eyes: Conjunctivae are normal.  Neck: Neck supple. No JVD present. No thyromegaly present.  Cardiovascular: Normal rate, regular rhythm and intact distal pulses.   Respiratory: Effort normal and breath sounds normal. No respiratory distress. He has no wheezes.  GI: Soft. Bowel sounds are normal. He exhibits no distension. There is no tenderness.  Musculoskeletal: He exhibits no edema.  Able to move all extremities   Lymphadenopathy:    He has no cervical adenopathy.  Neurological: He is alert and oriented to person, place, and time.  Skin: Skin is warm and dry. He is not diaphoretic.  Incision line without signs of infection present.   Psychiatric: He has a normal mood and affect.     ASSESSMENT/ PLAN:   Patient is being discharged with the following home health services:  None needed   Patient is being discharged with the following durable medical equipment:  None required   Patient has been advised to f/u with their PCP in 1-2 weeks to bring them up to date on their rehab stay.  Social services at facility was responsible for arranging this appointment.  Pt was provided with a 30 day supply of prescriptions for medications and refills must be obtained from their PCP.  For controlled substances, a more limited supply may be  provided adequate until PCP appointment only. #5 oxycodone 10 mg tabs.     Time spent with patient  45  minutes >50% time spent counseling; reviewing medical record; tests; labs; and developing future plan of care    Synthia InnocentDeborah Lashika Erker NP [facility]  Contact [phone] Monday through Friday 8am- 5pm  After hours call [phone]

## 2016-04-05 ENCOUNTER — Other Ambulatory Visit: Payer: Self-pay | Admitting: *Deleted

## 2016-04-05 DIAGNOSIS — G8918 Other acute postprocedural pain: Secondary | ICD-10-CM

## 2016-04-05 MED ORDER — TRAMADOL HCL 50 MG PO TABS: 50.0000 mg | ORAL_TABLET | Freq: Four times a day (QID) | ORAL | 0 refills | Status: DC | PRN

## 2016-04-17 ENCOUNTER — Other Ambulatory Visit: Payer: Self-pay | Admitting: Cardiothoracic Surgery

## 2016-04-17 DIAGNOSIS — Z951 Presence of aortocoronary bypass graft: Secondary | ICD-10-CM

## 2016-04-18 ENCOUNTER — Ambulatory Visit (INDEPENDENT_AMBULATORY_CARE_PROVIDER_SITE_OTHER): Payer: Self-pay | Admitting: Physician Assistant

## 2016-04-18 ENCOUNTER — Ambulatory Visit
Admission: RE | Admit: 2016-04-18 | Discharge: 2016-04-18 | Disposition: A | Discharge status: Home / Self Care | Payer: No Typology Code available for payment source | Source: Ambulatory Visit | Attending: Cardiothoracic Surgery | Admitting: Cardiothoracic Surgery

## 2016-04-18 VITALS — BP 136/93 | HR 96 | Resp 16 | Ht 71.0 in | Wt 150.0 lb

## 2016-04-18 DIAGNOSIS — Z951 Presence of aortocoronary bypass graft: Secondary | ICD-10-CM

## 2016-04-18 DIAGNOSIS — I2510 Atherosclerotic heart disease of native coronary artery without angina pectoris: Secondary | ICD-10-CM

## 2016-04-18 NOTE — Progress Notes (Signed)
HPI:   Patient returns for routine postoperative follow-up having undergone CABG x 5 on 03/19/2016.  The patient's early postoperative recovery while in the hospital was uncomplicated.  However being the patient is homeless he was discharged to SNF. Since hospital discharge the patient reports he is doing well.  He is currently staying in a homeless shelter as he is currently not working.  He is ambulating without difficulty.  He continues to not smoke but is currently out of nicotine patches and does not have money to purchase any.  His incisions are healing well, but he does have some burning along his left chest.  He has not followed up with Cardiology yet.  He has also not established care with PCP.   Finally he request a refill on Tramadol.  Current Outpatient Prescriptions  Medication Sig Dispense Refill  . aspirin EC 81 MG EC tablet Take 1 tablet (81 mg total) by mouth daily. 30 tablet 1  . atorvastatin (LIPITOR) 80 MG tablet Take 1 tablet (80 mg total) by mouth daily at 6 PM. 30 tablet 1  . diphenhydrAMINE (BENADRYL) 25 mg capsule Take 1 capsule (25 mg total) by mouth every 6 (six) hours as needed for allergies. 30 capsule 0  . metoprolol tartrate (LOPRESSOR) 25 MG tablet Take 12.5 mg by mouth daily.    . traMADol (ULTRAM) 50 MG tablet Take 1 tablet (50 mg total) by mouth every 6 (six) hours as needed. 20 tablet 0  . nicotine (NICODERM CQ - DOSED IN MG/24 HOURS) 21 mg/24hr patch Place 1 patch (21 mg total) onto the skin daily. (Patient not taking: Reported on 04/18/2016) 14 patch 0   No current facility-administered medications for this visit.     Physical Exam:  BP (!) 136/93 (BP Location: Left Arm, Patient Position: Sitting, Cuff Size: Normal)   Pulse 96   Resp 16   Ht 5\' 11"  (1.803 m)   Wt 150 lb (68 kg)   BMI 20.92 kg/m   Gen: no apparent distress Heart: RRR Lungs: CTA bilaterally Abd: soft non-tender, non-distended Ext: no edema Incisions: well healed  Diagnostic  Tests:  CXR: no pleural effusion, pneumothorax, post surgical changes present  A/P:  1. S/P CABG x 5 doing very well, remains on medications as prescribed.  He is scheduled to follow up with Dr. Donnie Ahoilley next week 2. Hypertension- his BP is mildly elevated in the 130s, would benefit from start of ACE-inhibitor... However without establish primary care will defer to Cardiology to determine if patient needs this medication as he would require occasional monitoring of creatinine level 3. Nicotine abuse- unfortunately we do not have nicotine patches in our office.  Patient was encouraged to continue to not smoke and instructed that they rare available OTC without a prescription.Marland Kitchen. He stated he will get some once he gets some money 4. RTW- slip provided with lifting restrictions of 20 lbs 5. Pain control- no further narcotics or tramadol will be given.Marland Kitchen. He was instructed to use Tylenol prn 6. Dispo- patient doing well... [facility], F/U with Dr. Donnie Ahoilley as instructed, RTC prn  Lowella DandyBARRETT, Matti Minney, PA-C [facility] [phone]

## 2016-04-18 NOTE — Patient Instructions (Signed)
Do not resume smoking cigarettes or any other tobacco products.   Make every effort to maintain a "heart-healthy" lifestyle with regular physical exercise and adherence to a low-fat, low-carbohydrate diet.  Continue to seek regular follow-up appointments with your primary care physician and/or cardiologist.  You may gradually increase your physical activity as tolerated without any particular limitations at this time, except limit lifting to 20 lbs

## 2016-04-19 ENCOUNTER — Ambulatory Visit: Payer: Self-pay | Admitting: Cardiothoracic Surgery

## 2016-08-31 ENCOUNTER — Emergency Department (HOSPITAL_COMMUNITY)
Admission: EM | Admit: 2016-08-31 | Discharge: 2016-08-31 | Disposition: A | Discharge status: Home / Self Care | Payer: Self-pay | Attending: Emergency Medicine | Admitting: Emergency Medicine

## 2016-08-31 ENCOUNTER — Encounter (HOSPITAL_COMMUNITY): Payer: Self-pay | Admitting: Emergency Medicine

## 2016-08-31 ENCOUNTER — Emergency Department (HOSPITAL_COMMUNITY): Payer: Self-pay

## 2016-08-31 DIAGNOSIS — Z951 Presence of aortocoronary bypass graft: Secondary | ICD-10-CM | POA: Insufficient documentation

## 2016-08-31 DIAGNOSIS — R0789 Other chest pain: Secondary | ICD-10-CM | POA: Insufficient documentation

## 2016-08-31 DIAGNOSIS — Z955 Presence of coronary angioplasty implant and graft: Secondary | ICD-10-CM | POA: Insufficient documentation

## 2016-08-31 DIAGNOSIS — I252 Old myocardial infarction: Secondary | ICD-10-CM | POA: Insufficient documentation

## 2016-08-31 DIAGNOSIS — F17210 Nicotine dependence, cigarettes, uncomplicated: Secondary | ICD-10-CM | POA: Insufficient documentation

## 2016-08-31 DIAGNOSIS — I2510 Atherosclerotic heart disease of native coronary artery without angina pectoris: Secondary | ICD-10-CM | POA: Insufficient documentation

## 2016-08-31 DIAGNOSIS — Z7982 Long term (current) use of aspirin: Secondary | ICD-10-CM | POA: Insufficient documentation

## 2016-08-31 LAB — CBC
HCT: 46.1 % (ref 39.0–52.0)
Hemoglobin: 16.1 g/dL (ref 13.0–17.0)
MCH: 31.8 pg (ref 26.0–34.0)
MCHC: 34.9 g/dL (ref 30.0–36.0)
MCV: 90.9 fL (ref 78.0–100.0)
PLATELETS: 236 10*3/uL (ref 150–400)
RBC: 5.07 MIL/uL (ref 4.22–5.81)
RDW: 15.2 % (ref 11.5–15.5)
WBC: 11.4 10*3/uL — AB (ref 4.0–10.5)

## 2016-08-31 LAB — BASIC METABOLIC PANEL
Anion gap: 11 (ref 5–15)
BUN: 11 mg/dL (ref 6–20)
CALCIUM: 8.6 mg/dL — AB (ref 8.9–10.3)
CO2: 21 mmol/L — ABNORMAL LOW (ref 22–32)
CREATININE: 0.91 mg/dL (ref 0.61–1.24)
Chloride: 105 mmol/L (ref 101–111)
Glucose, Bld: 99 mg/dL (ref 65–99)
Potassium: 3.6 mmol/L (ref 3.5–5.1)
SODIUM: 137 mmol/L (ref 135–145)

## 2016-08-31 LAB — I-STAT TROPONIN, ED: TROPONIN I, POC: 0.01 ng/mL (ref 0.00–0.08)

## 2016-08-31 LAB — D-DIMER, QUANTITATIVE (NOT AT ARMC)

## 2016-08-31 MED ORDER — OXYCODONE-ACETAMINOPHEN 5-325 MG PO TABS: 1.0000 | ORAL_TABLET | ORAL | 0 refills | Status: DC | PRN

## 2016-08-31 MED ORDER — ONDANSETRON HCL 4 MG/2ML IJ SOLN
4.0000 mg | Freq: Once | INTRAMUSCULAR | Status: AC
  Administered 2016-08-31: 4 mg via INTRAVENOUS
  Filled 2016-08-31: qty 2

## 2016-08-31 MED ORDER — FENTANYL CITRATE (PF) 100 MCG/2ML IJ SOLN
100.0000 ug | Freq: Once | INTRAMUSCULAR | Status: AC
  Administered 2016-08-31: 100 ug via INTRAVENOUS
  Filled 2016-08-31: qty 2

## 2016-08-31 NOTE — ED Provider Notes (Signed)
[facility]-EMERGENCY DEPT Provider Note: Douglas Dell, MD, FACEP  CSN: [id number] MRN: [id number] ARRIVAL: 08/31/16 at 0149 ROOM: WA18/WA18   CHIEF COMPLAINT  Chest Pain   HISTORY OF PRESENT ILLNESS  Douglas [name] is a 51 y.o. male with a history of coronary artery disease status post CABG in January of this year. He is here with right lower rib pain for the past 5 days. The pain has steadily worsened and he now rates it as a 9 out of 10. Pain is worse with palpation, movement and deep breathing. He denies shortness of breath. The pain is described as sharp and not like his anginal pain associated with his coronary artery disease. He denies injury to the chest wall. He has not had a fever. There is no associated rash.  Consultation with the [facility] state controlled substances database reveals the patient has received one prescription for oxycodone and 2 prescriptions for tramadol in the past year.   Past Medical History:  Diagnosis Date  . Anginal pain (HCC)   . Coronary artery disease   . GERD (gastroesophageal reflux disease)   . History of acute pancreatitis 04/06/2015  . NSTEMI (non-ST elevated myocardial infarction) (HCC) 01/2014  . Pancreatitis   . PTSD (post-traumatic stress disorder)   . Tobacco abuse     Past Surgical History:  Procedure Laterality Date  . CARDIAC CATHETERIZATION N/A 03/14/2016   Procedure: Left Heart Cath and Coronary Angiography;  Surgeon: Kathleene Hazel, MD;  Location: [facility] INVASIVE CV LAB;  Service: Cardiovascular;  Laterality: N/A;  . CORONARY ANGIOPLASTY WITH STENT PLACEMENT  01/18/2014   "1"  . CORONARY ARTERY BYPASS GRAFT N/A 03/19/2016   Procedure: CORONARY ARTERY BYPASS GRAFTING (CABG), ON PUMP, TIMES FIVE, USING LEFT INTERNAL MAMMARY ARTERY AND RIGHT GREATER SAPHENOUS VEIN HARVESTED ENDOSCOPICALLY;  Surgeon: Delight Ovens, MD;  Location: [facility] OR;  Service: Open Heart Surgery;  Laterality: N/A;  LIMA-LAD SVG-OM SVG-DIAG SEQ SVG-PD-PL   . LEFT HEART CATHETERIZATION WITH CORONARY ANGIOGRAM N/A 01/18/2014   Procedure: LEFT HEART CATHETERIZATION WITH CORONARY ANGIOGRAM;  Surgeon: Runell Gess, MD;  Location: [facility] CATH LAB;  Service: Cardiovascular;  Laterality: N/A;  . TEE WITHOUT CARDIOVERSION N/A 03/19/2016   Procedure: TRANSESOPHAGEAL ECHOCARDIOGRAM (TEE);  Surgeon: Delight Ovens, MD;  Location: [facility] OR;  Service: Open Heart Surgery;  Laterality: N/A;    Family History  Problem Relation Age of Onset  . Cancer Mother        Lung cancer  . Coronary artery disease Mother   . Heart failure Maternal Grandmother     Social History  Substance Use Topics  . Smoking status: Current Every Day Smoker    Packs/day: 1.00    Years: 31.00    Types: Cigarettes  . Smokeless tobacco: Never Used  . Alcohol use 0.6 oz/week    1 Cans of beer per week    Prior to Admission medications   Medication Sig Start Date End Date Taking? Authorizing Provider  Aspirin-Salicylamide-Caffeine (BC HEADACHE POWDER PO) Take 1 each by mouth daily as needed (pain).   Yes [provider]  metoprolol tartrate (LOPRESSOR) 25 MG tablet Take 12.5 mg by mouth daily after breakfast.    Yes [provider]    Allergies Ibuprofen and Shellfish allergy   REVIEW OF SYSTEMS  Negative except as noted here or in the History of Present Illness.   PHYSICAL EXAMINATION  Initial Vital Signs Blood pressure (!) 132/91, pulse 82, temperature 97.6 F (36.4 C), temperature  source Oral, resp. rate 18, height 5\' 11"  (1.803 m), weight 68 kg (150 lb), SpO2 98 %.  Examination General: Well-developed, well-nourished male in no acute distress; appearance consistent with age of record HENT: normocephalic; atraumatic Eyes: pupils equal, round and reactive to light; extraocular muscles intact Neck: supple Heart: regular rate and rhythm; no murmur Lungs: clear to auscultation bilaterally Chest: Right lower rib tenderness without deformity or  crepitus; no rash seen Abdomen: soft; nondistended; nontender; bowel sounds present Extremities: No deformity; full range of motion; pulses normal Neurologic: Awake, alert and oriented; motor function intact in all extremities and symmetric; no facial droop Skin: Warm and dry Psychiatric: Normal mood and affect   RESULTS  Summary of this visit's results, reviewed by myself:   EKG Interpretation  Date/Time:  Friday August 31 2016 01:56:42 EDT Ventricular Rate:  97 PR Interval:    QRS Duration: 83 QT Interval:  360 QTC Calculation: 458 R Axis:   74 Text Interpretation:  Sinus rhythm Baseline wander in lead(s) V5 V6 Normal ECG No significant change was found Confirmed by Evonne Rinks, Jonny RuizJohn ([id number]) on 08/31/2016 2:03:41 AM      Laboratory Studies: Results for orders placed or performed during the hospital encounter of 08/31/16 (from the past 24 hour(s))  Basic metabolic panel     Status: Abnormal   Collection Time: 08/31/16  2:06 AM  Result Value Ref Range   Sodium 137 135 - 145 mmol/L   Potassium 3.6 3.5 - 5.1 mmol/L   Chloride 105 101 - 111 mmol/L   CO2 21 (L) 22 - 32 mmol/L   Glucose, Bld 99 65 - 99 mg/dL   BUN 11 6 - 20 mg/dL   Creatinine, Ser 6.040.91 0.61 - 1.24 mg/dL   Calcium 8.6 (L) 8.9 - 10.3 mg/dL   GFR calc non Af Amer >60 >60 mL/min   GFR calc Af Amer >60 >60 mL/min   Anion gap 11 5 - 15  CBC     Status: Abnormal   Collection Time: 08/31/16  2:06 AM  Result Value Ref Range   WBC 11.4 (H) 4.0 - 10.5 K/uL   RBC 5.07 4.22 - 5.81 MIL/uL   Hemoglobin 16.1 13.0 - 17.0 g/dL   HCT 54.046.1 98.139.0 - 19.152.0 %   MCV 90.9 78.0 - 100.0 fL   MCH 31.8 26.0 - 34.0 pg   MCHC 34.9 30.0 - 36.0 g/dL   RDW 47.815.2 29.511.5 - 62.115.5 %   Platelets 236 150 - 400 K/uL  D-dimer, quantitative (not at [facility])     Status: None   Collection Time: 08/31/16  2:06 AM  Result Value Ref Range   D-Dimer, Quant <0.27 0.00 - 0.50 ug/mL-FEU  I-stat troponin, ED     Status: None   Collection Time: 08/31/16  2:15 AM    Result Value Ref Range   Troponin i, poc 0.01 0.00 - 0.08 ng/mL   Comment 3           Imaging Studies: Dg Chest 2 View  Result Date: 08/31/2016 CLINICAL DATA:  Initial evaluation for acute chest pain. EXAM: CHEST  2 VIEW COMPARISON:  Prior radiograph from 04/18/2016. FINDINGS: Median sternotomy wires underlying CABG markers noted, stable. Cardiac and mediastinal silhouettes are stable, and remain within normal limits. Lungs are normally inflated. No focal infiltrates. No pulmonary edema or pleural effusion. No pneumothorax. Calcified granuloma overlies the right upper lobe, stable. No acute osseous abnormality. IMPRESSION: 1. No active cardiopulmonary disease. 2. Sequelae of prior CABG. Electronically  Signed   By: Rise Mu M.D.   On: 08/31/2016 02:44    ED COURSE  Nursing notes and initial vitals signs, including pulse oximetry, reviewed.  Vitals:   08/31/16 0156 08/31/16 0157 08/31/16 0245  BP:  (!) 132/91   Pulse:  (!) 101 82  Resp:  10 18  Temp:  97.6 F (36.4 C)   TempSrc:  Oral   SpO2: 99% 98% 98%  Weight:  68 kg (150 lb)   Height:  5\' 11"  (1.803 m)     PROCEDURES    ED DIAGNOSES     ICD-10-CM   1. Chest wall pain R07.89        Paula Libra, MD 08/31/16 0400

## 2016-08-31 NOTE — ED Triage Notes (Signed)
Pt reports having sharp chest pain on right side with pain when taking deep breath. Pt reports pain began several days ago. Pt reports having 5way bypass on Mar 19, 2016.

## 2016-08-31 NOTE — ED Notes (Signed)
Patient transported to X-ray 

## 2016-09-07 ENCOUNTER — Encounter (HOSPITAL_COMMUNITY): Payer: Self-pay | Admitting: Emergency Medicine

## 2016-09-07 ENCOUNTER — Emergency Department (HOSPITAL_COMMUNITY)
Admission: EM | Admit: 2016-09-07 | Discharge: 2016-09-07 | Disposition: A | Discharge status: Home / Self Care | Payer: Self-pay | Attending: Emergency Medicine | Admitting: Emergency Medicine

## 2016-09-07 ENCOUNTER — Emergency Department (HOSPITAL_COMMUNITY): Payer: Self-pay

## 2016-09-07 DIAGNOSIS — R079 Chest pain, unspecified: Secondary | ICD-10-CM | POA: Insufficient documentation

## 2016-09-07 DIAGNOSIS — Z5321 Procedure and treatment not carried out due to patient leaving prior to being seen by health care provider: Secondary | ICD-10-CM | POA: Insufficient documentation

## 2016-09-07 LAB — BASIC METABOLIC PANEL
ANION GAP: 10 (ref 5–15)
BUN: 6 mg/dL (ref 6–20)
CALCIUM: 8.7 mg/dL — AB (ref 8.9–10.3)
CO2: 21 mmol/L — ABNORMAL LOW (ref 22–32)
Chloride: 108 mmol/L (ref 101–111)
Creatinine, Ser: 1.01 mg/dL (ref 0.61–1.24)
Glucose, Bld: 93 mg/dL (ref 65–99)
Potassium: 3.9 mmol/L (ref 3.5–5.1)
SODIUM: 139 mmol/L (ref 135–145)

## 2016-09-07 LAB — CBC
HCT: 48.1 % (ref 39.0–52.0)
HEMOGLOBIN: 16.6 g/dL (ref 13.0–17.0)
MCH: 31.9 pg (ref 26.0–34.0)
MCHC: 34.5 g/dL (ref 30.0–36.0)
MCV: 92.3 fL (ref 78.0–100.0)
Platelets: 249 10*3/uL (ref 150–400)
RBC: 5.21 MIL/uL (ref 4.22–5.81)
RDW: 15.6 % — ABNORMAL HIGH (ref 11.5–15.5)
WBC: 8.7 10*3/uL (ref 4.0–10.5)

## 2016-09-07 LAB — I-STAT TROPONIN, ED: TROPONIN I, POC: 0.02 ng/mL (ref 0.00–0.08)

## 2016-09-07 NOTE — ED Triage Notes (Signed)
Pt presents to ED for assessment of left sided chest pain, radiating down left arm with some numbness.  Pt has a dry cough at triage with c/o abdominal pain when coughing.  PT states he has had diarrhea x 3 days.  Pt c/o SOB with exertion.  Hx of CABG on Jan 8th.

## 2016-10-08 ENCOUNTER — Emergency Department (HOSPITAL_COMMUNITY): Payer: Self-pay

## 2016-10-08 ENCOUNTER — Encounter (HOSPITAL_COMMUNITY): Payer: Self-pay | Admitting: Emergency Medicine

## 2016-10-08 ENCOUNTER — Observation Stay (HOSPITAL_COMMUNITY)
Admission: EM | Admit: 2016-10-08 | Discharge: 2016-10-09 | Disposition: A | Discharge status: Home / Self Care | Payer: Self-pay | Attending: Internal Medicine | Admitting: Internal Medicine

## 2016-10-08 DIAGNOSIS — Z8719 Personal history of other diseases of the digestive system: Secondary | ICD-10-CM | POA: Insufficient documentation

## 2016-10-08 DIAGNOSIS — Z91013 Allergy to seafood: Secondary | ICD-10-CM | POA: Insufficient documentation

## 2016-10-08 DIAGNOSIS — Z888 Allergy status to other drugs, medicaments and biological substances status: Secondary | ICD-10-CM | POA: Insufficient documentation

## 2016-10-08 DIAGNOSIS — F1011 Alcohol abuse, in remission: Secondary | ICD-10-CM | POA: Insufficient documentation

## 2016-10-08 DIAGNOSIS — F4310 Post-traumatic stress disorder, unspecified: Secondary | ICD-10-CM | POA: Insufficient documentation

## 2016-10-08 DIAGNOSIS — F17200 Nicotine dependence, unspecified, uncomplicated: Secondary | ICD-10-CM | POA: Insufficient documentation

## 2016-10-08 DIAGNOSIS — I2510 Atherosclerotic heart disease of native coronary artery without angina pectoris: Secondary | ICD-10-CM | POA: Diagnosis present

## 2016-10-08 DIAGNOSIS — R079 Chest pain, unspecified: Principal | ICD-10-CM | POA: Diagnosis present

## 2016-10-08 DIAGNOSIS — Z951 Presence of aortocoronary bypass graft: Secondary | ICD-10-CM

## 2016-10-08 DIAGNOSIS — Z7982 Long term (current) use of aspirin: Secondary | ICD-10-CM | POA: Insufficient documentation

## 2016-10-08 DIAGNOSIS — R55 Syncope and collapse: Secondary | ICD-10-CM | POA: Diagnosis present

## 2016-10-08 DIAGNOSIS — Z79899 Other long term (current) drug therapy: Secondary | ICD-10-CM | POA: Insufficient documentation

## 2016-10-08 DIAGNOSIS — Z955 Presence of coronary angioplasty implant and graft: Secondary | ICD-10-CM | POA: Insufficient documentation

## 2016-10-08 DIAGNOSIS — Z8249 Family history of ischemic heart disease and other diseases of the circulatory system: Secondary | ICD-10-CM | POA: Insufficient documentation

## 2016-10-08 DIAGNOSIS — I25119 Atherosclerotic heart disease of native coronary artery with unspecified angina pectoris: Secondary | ICD-10-CM | POA: Insufficient documentation

## 2016-10-08 DIAGNOSIS — K219 Gastro-esophageal reflux disease without esophagitis: Secondary | ICD-10-CM | POA: Insufficient documentation

## 2016-10-08 DIAGNOSIS — Z720 Tobacco use: Secondary | ICD-10-CM | POA: Diagnosis present

## 2016-10-08 DIAGNOSIS — R0789 Other chest pain: Secondary | ICD-10-CM

## 2016-10-08 LAB — BASIC METABOLIC PANEL
ANION GAP: 11 (ref 5–15)
BUN: 11 mg/dL (ref 6–20)
CHLORIDE: 112 mmol/L — AB (ref 101–111)
CO2: 18 mmol/L — ABNORMAL LOW (ref 22–32)
Calcium: 8.1 mg/dL — ABNORMAL LOW (ref 8.9–10.3)
Creatinine, Ser: 1.11 mg/dL (ref 0.61–1.24)
GFR calc Af Amer: >60 mL/min (ref >60)
GFR calc non Af Amer: >60 mL/min (ref >60)
GLUCOSE: 99 mg/dL (ref 65–99)
POTASSIUM: 3.7 mmol/L (ref 3.5–5.1)
SODIUM: 141 mmol/L (ref 135–145)

## 2016-10-08 LAB — HEPATIC FUNCTION PANEL
ALT: 13 U/L — ABNORMAL LOW (ref 17–63)
AST: 18 U/L (ref 15–41)
Albumin: 2.9 g/dL — ABNORMAL LOW (ref 3.5–5.0)
Alkaline Phosphatase: 81 U/L (ref 38–126)
Bilirubin, Direct: 0.2 mg/dL (ref 0.1–0.5)
Indirect Bilirubin: 0 mg/dL — ABNORMAL LOW (ref 0.3–0.9)
Total Bilirubin: 0.2 mg/dL — ABNORMAL LOW (ref 0.3–1.2)
Total Protein: 5.2 g/dL — ABNORMAL LOW (ref 6.5–8.1)

## 2016-10-08 LAB — CBC
HEMATOCRIT: 42 % (ref 39.0–52.0)
HEMATOCRIT: 44 % (ref 39.0–52.0)
Hemoglobin: 14.2 g/dL (ref 13.0–17.0)
Hemoglobin: 14.8 g/dL (ref 13.0–17.0)
MCH: 31.8 pg (ref 26.0–34.0)
MCH: 31.8 pg (ref 26.0–34.0)
MCHC: 33.8 g/dL (ref 30.0–36.0)
MCHC: 33.6 g/dL (ref 30.0–36.0)
MCV: 94 fL (ref 78.0–100.0)
MCV: 94.4 fL (ref 78.0–100.0)
PLATELETS: 213 10*3/uL (ref 150–400)
Platelets: 217 10*3/uL (ref 150–400)
RBC: 4.47 MIL/uL (ref 4.22–5.81)
RBC: 4.66 MIL/uL (ref 4.22–5.81)
RDW: 14.9 % (ref 11.5–15.5)
RDW: 15.3 % (ref 11.5–15.5)
WBC: 8.1 10*3/uL (ref 4.0–10.5)
WBC: 9.3 10*3/uL (ref 4.0–10.5)

## 2016-10-08 LAB — D-DIMER, QUANTITATIVE: D-Dimer, Quant: <0.27 ug/mL-FEU (ref 0.00–0.50)

## 2016-10-08 LAB — LIPASE, BLOOD: LIPASE: 30 U/L (ref 11–51)

## 2016-10-08 LAB — I-STAT TROPONIN, ED: Troponin i, poc: 0 ng/mL (ref 0.00–0.08)

## 2016-10-08 MED ORDER — SODIUM CHLORIDE 0.9 % IV BOLUS (SEPSIS)
1000.0000 mL | Freq: Once | INTRAVENOUS | Status: AC
  Administered 2016-10-08: 1000 mL via INTRAVENOUS

## 2016-10-08 MED ORDER — NITROGLYCERIN 0.4 MG SL SUBL
0.4000 mg | SUBLINGUAL_TABLET | SUBLINGUAL | Status: DC | PRN
  Administered 2016-10-09 (×3): 0.4 mg via SUBLINGUAL
  Filled 2016-10-08: qty 1

## 2016-10-08 MED ORDER — ZOLPIDEM TARTRATE 5 MG PO TABS: 5.0000 mg | ORAL_TABLET | Freq: Every evening | ORAL | Status: DC | PRN

## 2016-10-08 MED ORDER — MORPHINE SULFATE (PF) 4 MG/ML IV SOLN
6.0000 mg | Freq: Once | INTRAVENOUS | Status: AC
  Administered 2016-10-08: 6 mg via INTRAVENOUS
  Filled 2016-10-08: qty 2

## 2016-10-08 MED ORDER — MORPHINE SULFATE (PF) 4 MG/ML IV SOLN
2.0000 mg | INTRAVENOUS | Status: DC | PRN
Start: 2016-10-08 — End: 2016-10-09
  Administered 2016-10-08 – 2016-10-09 (×5): 2 mg via INTRAVENOUS
  Filled 2016-10-08 (×5): qty 1

## 2016-10-08 MED ORDER — ENOXAPARIN SODIUM 40 MG/0.4ML ~~LOC~~ SOLN
40.0000 mg | Freq: Every day | SUBCUTANEOUS | Status: DC
  Administered 2016-10-08: 40 mg via SUBCUTANEOUS
  Filled 2016-10-08: qty 0.4

## 2016-10-08 MED ORDER — ACETAMINOPHEN 325 MG PO TABS: 650.0000 mg | ORAL_TABLET | ORAL | Status: DC | PRN

## 2016-10-08 MED ORDER — ONDANSETRON HCL 4 MG/2ML IJ SOLN
4.0000 mg | Freq: Four times a day (QID) | INTRAMUSCULAR | Status: DC | PRN
  Administered 2016-10-08: 4 mg via INTRAVENOUS
  Filled 2016-10-08: qty 2

## 2016-10-08 MED ORDER — ASPIRIN EC 325 MG PO TBEC
325.0000 mg | DELAYED_RELEASE_TABLET | Freq: Every day | ORAL | Status: DC
  Administered 2016-10-09: 325 mg via ORAL
  Filled 2016-10-08: qty 1

## 2016-10-08 MED ORDER — METOPROLOL TARTRATE 25 MG PO TABS
25.0000 mg | ORAL_TABLET | Freq: Every day | ORAL | Status: DC
  Administered 2016-10-09: 25 mg via ORAL
  Filled 2016-10-08: qty 1

## 2016-10-08 MED ORDER — MORPHINE SULFATE (PF) 4 MG/ML IV SOLN
4.0000 mg | Freq: Once | INTRAVENOUS | Status: AC
  Administered 2016-10-08: 4 mg via INTRAVENOUS
  Filled 2016-10-08: qty 1

## 2016-10-08 NOTE — Consult Note (Signed)
Cardiology Consultation:   Patient ID: Douglas [name]; [id number]; 05/08/1965   Admit date: 10/08/2016 Date of Consult: 10/08/2016  Primary Care Provider: Patient, No Pcp Per Primary Cardiologist: Dr. Donnie Aho Primary Electrophysiologist:  None CT surg: Dr. Tyrone Sage   Patient Profile:   Douglas [name] is a 51 y.o. male with a hx of Recent non-ST elevation myocardial infarction with CABG who is being seen today for the evaluation of chest pain and syncope at the request of Dr. Clayborne Dana.  History of Present Illness:   Douglas [name] is a 51 year old male with a history of severe coronary artery disease status post CABG on 1/8/185 with history of pancreatitis, alcohol use, tobacco use who is here with chest pain and syncopal episode. He states that he was having an argument with his father started to walk away and felt diaphoresis, radiation to both arms and described a sharp severe 10 over 10 chest discomfort. As he was walking back to his apartment he said that he fell out in the yard. Perhaps he was out for approximate 5 minutes. No residual weaknesses. No nausea, no vomiting. In the emergency room, he still complaining of some discomfort and is asking for pain medication. He did note some relief with nitroglycerin. He has noted chest pain off and on since his bypass surgery.  He was seen most recently in emergency department on 08/31/16 with described chest wall pain. No evidence of thrombosis. He's had some issues with medical noncompliance.  His last cardiac catheterization prior to his CABG demonstrated 50% distal left main stenosis and restenosis of the mid RCA previously stented lesion and 99% mid circumflex stenosis.  Thus far, troponin is normal, EKG is unremarkable with no ischemic changes, chest x-ray normal. Lipase was negative.  Past Medical History:  Diagnosis Date  . Anginal pain (HCC)   . Coronary artery disease   . GERD (gastroesophageal reflux disease)   . History of  acute pancreatitis 04/06/2015  . NSTEMI (non-ST elevated myocardial infarction) (HCC) 01/2014  . Pancreatitis   . PTSD (post-traumatic stress disorder)   . Tobacco abuse     Past Surgical History:  Procedure Laterality Date  . CARDIAC CATHETERIZATION N/A 03/14/2016   Procedure: Left Heart Cath and Coronary Angiography;  Surgeon: Kathleene Hazel, MD;  Location: [facility] INVASIVE CV LAB;  Service: Cardiovascular;  Laterality: N/A;  . CORONARY ANGIOPLASTY WITH STENT PLACEMENT  01/18/2014   "1"  . CORONARY ARTERY BYPASS GRAFT N/A 03/19/2016   Procedure: CORONARY ARTERY BYPASS GRAFTING (CABG), ON PUMP, TIMES FIVE, USING LEFT INTERNAL MAMMARY ARTERY AND RIGHT GREATER SAPHENOUS VEIN HARVESTED ENDOSCOPICALLY;  Surgeon: Delight Ovens, MD;  Location: [facility] OR;  Service: Open Heart Surgery;  Laterality: N/A;  LIMA-LAD SVG-OM SVG-DIAG SEQ SVG-PD-PL  . LEFT HEART CATHETERIZATION WITH CORONARY ANGIOGRAM N/A 01/18/2014   Procedure: LEFT HEART CATHETERIZATION WITH CORONARY ANGIOGRAM;  Surgeon: Runell Gess, MD;  Location: [facility] CATH LAB;  Service: Cardiovascular;  Laterality: N/A;  . TEE WITHOUT CARDIOVERSION N/A 03/19/2016   Procedure: TRANSESOPHAGEAL ECHOCARDIOGRAM (TEE);  Surgeon: Delight Ovens, MD;  Location: [facility] OR;  Service: Open Heart Surgery;  Laterality: N/A;     Inpatient Medications: Scheduled Meds: .  morphine injection  6 mg Intravenous Once   Continuous Infusions: . sodium chloride     PRN Meds:   Allergies:    Allergies  Allergen Reactions  . Ibuprofen Nausea And Vomiting and Other (See Comments)    UPSET STOMACH  . Shellfish Allergy Nausea And Vomiting  Social History:   Social History   Social History  . Marital status: Divorced    Spouse name: N/A  . Number of children: 2  . Years of education: N/A   Occupational History  . [employer]    Social History Main Topics  . Smoking status: Former Smoker    Packs/day: 1.00    Years: 31.00    Types: Cigarettes  .  Smokeless tobacco: Never Used  . Alcohol use 0.6 oz/week    1 Cans of beer per week  . Drug use: No     Comment: denies 06/17/2015  . Sexual activity: Not Currently   Other Topics Concern  . Not on file   Social History Narrative   Divorced.  2 sons.  Works at a [employer] in the [employer].  Formerly waited tables.    Family History:   The patient's family history includes Cancer in his mother; Coronary artery disease in his mother; Heart failure in his maternal grandmother.  ROS:  Please see the history of present illness.  ROS positive for syncope, chest pain. Positive for history of alcohol use, noncompliance. No recent fevers, chills, orthopnea, PND. All other ROS reviewed and negative.     Physical Exam/Data:   Vitals:   10/08/16 1801 10/08/16 1815 10/08/16 1830 10/08/16 1915  BP:  119/77 132/79 110/69  Pulse: (!) 110 (!) 107 (!) 109 (!) 107  Resp: 13 20 (!) 27 19  Temp: 97.9 F (36.6 C)     TempSrc: Oral     SpO2: 95% 94% 92% 92%  Weight: 160 lb (72.6 kg)     Height: 5\' 11"  (1.803 m)      No intake or output data in the 24 hours ending 10/08/16 2023 Filed Weights   10/08/16 1801  Weight: 160 lb (72.6 kg)   Body mass index is 22.32 kg/m.  General:  Well nourished, well developed, in no acute distress HEENT: normal Lymph: no adenopathy Neck: no JVD Endocrine:  No thryomegaly Vascular: No carotid bruits; Normal distal pulses  Cardiac:  normal S1, S2; RRR; no murmur post CABG scar noted Lungs:  clear to auscultation bilaterally, no wheezing, rhonchi or rales  Abd: soft, nontender, no hepatomegaly  Ext: no edema Musculoskeletal:  No deformities, BUE and BLE strength normal and equal Skin: warm and dry  Neuro:  CNs 2-12 intact, no focal abnormalities noted Psych:  Normal affect   EKG:  The EKG was personally reviewed and demonstrates:  Sinus tachycardia rate 108 with no ST segment changes. No change from prior EKG personally viewed Telemetry:  Telemetry was  personally reviewed and demonstrates:  Sinus tachycardia currently. No VT.  Relevant CV Studies: Prior catheterization reviewed. Echocardiogram 03/15/16 showed normal ejection fraction.  Laboratory Data:  Chemistry Recent Labs Lab 10/08/16 1811  NA 141  K 3.7  CL 112*  CO2 18*  GLUCOSE 99  BUN 11  CREATININE 1.11  CALCIUM 8.1*  GFRNONAA >60  GFRAA >60  ANIONGAP 11     Recent Labs Lab 10/08/16 1811  PROT 5.2*  ALBUMIN 2.9*  AST 18  ALT 13*  ALKPHOS 81  BILITOT 0.2*   Hematology Recent Labs Lab 10/08/16 1811  WBC 9.3  RBC 4.47  HGB 14.2  HCT 42.0  MCV 94.0  MCH 31.8  MCHC 33.8  RDW 14.9  PLT 217   Cardiac EnzymesNo results for input(s): TROPONINI in the last 168 hours.  Recent Labs Lab 10/08/16 1919  TROPIPOC 0.00  BNPNo results for input(s): BNP, PROBNP in the last 168 hours.  DDimer  Recent Labs Lab 10/08/16 1811  DDIMER <0.27    Radiology/Studies:  Dg Chest 2 View  Result Date: 10/08/2016 CLINICAL DATA:  Intermittent mid and left chest pain for the past week. Bilateral arm numbness. EXAM: CHEST  2 VIEW COMPARISON:  09/07/2016. FINDINGS: Normal sized heart. Post CABG changes. Clear lungs with normal vascularity. Unremarkable bones. IMPRESSION: No acute abnormality. Electronically Signed   By: Beckie SaltsSteven  Reid M.D.   On: 10/08/2016 19:00    Assessment and Plan:   Chest pain  - Has both typical as well as atypical features. Thankfully troponin is currently normal. EKG does not show any ischemic changes.  - Continue to cycle troponin  - Keep nothing by mouth past midnight for possible further testing but troponin is all normal and he is feeling better, it is likely that he is having noncardiac chest discomfort, perhaps musculoskeletal chest pain. He does state that sometimes being in different positions can help out his discomfort. This points towards this differential diagnosis. He does not have any fevers, no significant white count no signs of  infection.  - Note: he does have shellfish allergy if cardiac catheterization is potentially warranted, i.e. if troponin elevates. I would give him prednisone 50 mg every 6 hours prior to procedure.  Syncope  - Unsure of etiology. Continue telemetry. It is possible that he may have been dehydrated given underlying heat and fainted because of hypovolemia. Prior ejection fraction normal. It would not be unreasonable to repeat echocardiogram post CABG. No current signs of any dangerous arrhythmias. Currently stable. Mild tachycardia may be pointing towards hypovolemia.  Coronary artery disease status post CABG  - Continue with secondary prevention. Encourage use of medications.  History of alcohol use and pancreatitis  - Lipase currently normal. Encourage alcohol cessation.  Dr. Donnie Ahoilley will see in the morning.  Signed, Donato SchultzMark Braven Wolk, MD  10/08/2016 8:23 PM

## 2016-10-08 NOTE — ED Triage Notes (Signed)
Per EMS pt got stressed out in argument at pool had chest pain and then had syncopal episode. Pt has past stents placed, when ems arrived pt sweaty and pain down both arms and in pressure central chest. Numbness in both arms currently. 324 aspirin, 2 nitro pain down to 5.

## 2016-10-08 NOTE — H&P (Signed)
History and Physical    Douglas EndsJonathan [name] JYN:[id number] DOB: [birth year] DOA: 10/08/2016  PCP: Patient, No Pcp Per  Patient coming from: Home.  Chief Complaint: Chest pain. Loss of consciousness.  HPI: Douglas EndsJonathan [name] is a 51 y.o. male with history of CAD status post CABG January 2018 with history of alcoholic pancreatitis and tobacco abuse presents to the ER with complaints of chest pain or loss of consciousness. Patient had an argument with his dad following which he started developing chest pain stabbing in nature radiating to the office with diaphoresis. Patient when walking back to his apartment loss consciousness briefly. Patient states he may have lost consciousness for about 5 minutes. Denies any incontinence of urine or tongue bite.   ED Course: In the ER EKG shows normal sinus rhythm chest x-ray was unremarkable troponin was negative and cardiology was consulted. Patient on exam has chest pain which is reproducible. Chest pain improved with sublingual nitroglycerin and morphine. Patient is being admitted for further observation.  Review of Systems: As per HPI, rest all negative.   Past Medical History:  Diagnosis Date  . Anginal pain (HCC)   . Coronary artery disease   . GERD (gastroesophageal reflux disease)   . History of acute pancreatitis 04/06/2015  . NSTEMI (non-ST elevated myocardial infarction) (HCC) 01/2014  . Pancreatitis   . PTSD (post-traumatic stress disorder)   . Tobacco abuse     Past Surgical History:  Procedure Laterality Date  . CARDIAC CATHETERIZATION N/A 03/14/2016   Procedure: Left Heart Cath and Coronary Angiography;  Surgeon: Kathleene Hazelhristopher D McAlhany, MD;  [facility];  Service: Cardiovascular;  Laterality: N/A;  . CORONARY ANGIOPLASTY WITH STENT PLACEMENT  01/18/2014   "1"  . CORONARY ARTERY BYPASS GRAFT N/A 03/19/2016   Procedure: CORONARY ARTERY BYPASS GRAFTING (CABG), ON PUMP, TIMES FIVE, USING LEFT INTERNAL MAMMARY ARTERY AND RIGHT  GREATER SAPHENOUS VEIN HARVESTED ENDOSCOPICALLY;  Surgeon: Delight OvensEdward B Gerhardt, MD;  [facility];  Service: Open Heart Surgery;  Laterality: N/A;  LIMA-LAD SVG-OM SVG-DIAG SEQ SVG-PD-PL  . LEFT HEART CATHETERIZATION WITH CORONARY ANGIOGRAM N/A 01/18/2014   Procedure: LEFT HEART CATHETERIZATION WITH CORONARY ANGIOGRAM;  Surgeon: Runell GessJonathan J Berry, MD;  [facility];  Service: Cardiovascular;  Laterality: N/A;  . TEE WITHOUT CARDIOVERSION N/A 03/19/2016   Procedure: TRANSESOPHAGEAL ECHOCARDIOGRAM (TEE);  Surgeon: Delight OvensEdward B Gerhardt, MD;  [facility];  Service: Open Heart Surgery;  Laterality: N/A;     reports that he has quit smoking. His smoking use included Cigarettes. He has a 31.00 pack-year smoking history. He has never used smokeless tobacco. He reports that he drinks about 0.6 oz of alcohol per week . He reports that he does not use drugs.  Allergies  Allergen Reactions  . Ibuprofen Nausea And Vomiting    UPSETS STOMACH  . Shellfish Allergy Nausea And Vomiting    Family History  Problem Relation Age of Onset  . Cancer Mother        Lung cancer  . Coronary artery disease Mother   . Heart failure Maternal Grandmother     Prior to Admission medications   Medication Sig Start Date End Date Taking? Authorizing Provider  Aspirin-Salicylamide-Caffeine (BC HEADACHE POWDER PO) Take 1 packet by mouth daily.    Yes [provider]  metoprolol tartrate (LOPRESSOR) 25 MG tablet Take 25 mg by mouth daily after breakfast.    Yes [provider]  oxyCODONE-acetaminophen (PERCOCET) 5-325 MG tablet Take 1 tablet by mouth every  4 (four) hours as needed (for chest wall pain). Patient not taking: Reported on 10/08/2016 08/31/16   Paula LibraMolpus, John, MD    Physical Exam: Vitals:   10/08/16 2030 10/08/16 2045 10/08/16 2115 10/08/16 2130  BP: 110/68 133/73 130/76 137/81  Pulse: 99 (!) 102 100 98  Resp: 16 18 13 15   Temp:      TempSrc:      SpO2: 94% 92% 94% 93%  Weight:       Height:          Constitutional: Moderately built and nourished. Vitals:   10/08/16 2030 10/08/16 2045 10/08/16 2115 10/08/16 2130  BP: 110/68 133/73 130/76 137/81  Pulse: 99 (!) 102 100 98  Resp: 16 18 13 15   Temp:      TempSrc:      SpO2: 94% 92% 94% 93%  Weight:      Height:       Eyes: Anicteric no pallor. ENMT: No discharge from the ears eyes nose and mouth. Neck: No mass felt. No JVD appreciated. Respiratory: No rhonchi or crepitations. Cardiovascular: S1-S2 heard no murmurs appreciated. Abdomen: Soft nontender bowel sounds present. Musculoskeletal: No edema. No joint effusion. Skin: No rash or skin appears warm. Neurologic: Alert awake oriented to time place and person. Moves all extremities. Psychiatric: Appears normal. Normal affect.   Labs on Admission: I have personally reviewed following labs and imaging studies  CBC:  Recent Labs Lab 10/08/16 1811  WBC 9.3  HGB 14.2  HCT 42.0  MCV 94.0  PLT 217   Basic Metabolic Panel:  Recent Labs Lab 10/08/16 1811  NA 141  K 3.7  CL 112*  CO2 18*  GLUCOSE 99  BUN 11  CREATININE 1.11  CALCIUM 8.1*   GFR: Estimated Creatinine Clearance: 82.7 mL/min (by C-G formula based on SCr of 1.11 mg/dL). Liver Function Tests:  Recent Labs Lab 10/08/16 1811  AST 18  ALT 13*  ALKPHOS 81  BILITOT 0.2*  PROT 5.2*  ALBUMIN 2.9*    Recent Labs Lab 10/08/16 1811  LIPASE 30   No results for input(s): AMMONIA in the last 168 hours. Coagulation Profile: No results for input(s): INR, PROTIME in the last 168 hours. Cardiac Enzymes: No results for input(s): CKTOTAL, CKMB, CKMBINDEX, TROPONINI in the last 168 hours. BNP (last 3 results) No results for input(s): PROBNP in the last 8760 hours. HbA1C: No results for input(s): HGBA1C in the last 72 hours. CBG: No results for input(s): GLUCAP in the last 168 hours. Lipid Profile: No results for input(s): CHOL, HDL, LDLCALC, TRIG, CHOLHDL, LDLDIRECT in the last  72 hours. Thyroid Function Tests: No results for input(s): TSH, T4TOTAL, FREET4, T3FREE, THYROIDAB in the last 72 hours. Anemia Panel: No results for input(s): VITAMINB12, FOLATE, FERRITIN, TIBC, IRON, RETICCTPCT in the last 72 hours. Urine analysis:    Component Value Date/Time   COLORURINE YELLOW 01/29/2016 0051   APPEARANCEUR CLEAR 01/29/2016 0051   LABSPEC 1.016 01/29/2016 0051   PHURINE 5.5 01/29/2016 0051   GLUCOSEU NEGATIVE 01/29/2016 0051   HGBUR NEGATIVE 01/29/2016 0051   BILIRUBINUR NEGATIVE 01/29/2016 0051   KETONESUR NEGATIVE 01/29/2016 0051   PROTEINUR NEGATIVE 01/29/2016 0051   UROBILINOGEN 1.0 01/15/2013 1135   NITRITE NEGATIVE 01/29/2016 0051   LEUKOCYTESUR NEGATIVE 01/29/2016 0051   Sepsis Labs: @LABRCNTIP (procalcitonin:4,lacticidven:4) )No results found for this or any previous visit (from the past 240 hour(s)).   Radiological Exams on Admission: Dg Chest 2 View  Result Date: 10/08/2016 CLINICAL DATA:  Intermittent mid  and left chest pain for the past week. Bilateral arm numbness. EXAM: CHEST  2 VIEW COMPARISON:  09/07/2016. FINDINGS: Normal sized heart. Post CABG changes. Clear lungs with normal vascularity. Unremarkable bones. IMPRESSION: No acute abnormality. Electronically Signed   By: Beckie Salts M.D.   On: 10/08/2016 19:00    EKG: Independently reviewed. Normal sinus rhythm.  Assessment/Plan Principal Problem:   Chest pain Active Problems:   Tobacco abuse   CAD (coronary artery disease), native coronary artery   S/P CABG x 5    1. Chest pain with history of CAD status post CABG in January 2018 - some of the chest pain is reproducible on palpation. Appreciate cardiology consult. Cycle cardiac markers check 2-D echo aspirin and metoprolol. 2. Syncope - cause not clear. Cardiology thinks patient may have been dehydrated. Patient did received 2 L normal saline bolus in the ER. Closely observe in telemetry for any arrhythmias check 2-D echo. D-dimer was  negative. 3. History of alcoholic pancreatitis - denies any abdominal pain and lipase was normal. 4. History of tobacco abuse - tobacco cessation counseling requested. 5. History of alcohol abuse - patient states he only drinks once or twice a week now days. On thiamine.  Appreciate cardiology consult. I have reviewed patient's old charts and labs.   DVT prophylaxis: Lovenox. Code Status: Full code.  Family Communication: Discussed with patient.  Disposition Plan: Home.  Consults called: Cardiology.  Admission status: Observation.    Eduard Clos MD [facility] Pager [phone].  If 7PM-7AM, please contact night-coverage [email or web address] Password [email or web address]  10/08/2016, 10:06 PM

## 2016-10-08 NOTE — ED Provider Notes (Signed)
[facility]-EM[facility]ENCY DEPT Provider Note   CSN: [id number] Arrival date & time: 10/08/16  1754     History   Chief Complaint Chief Complaint  Patient presents with  . Chest Pain    HPI Douglas [name] is a 51 y.o. male.  HPI  51 y.o. male with a hx of coronary artery disease status post CABG in January of this year, presents to the Emergency Department today due to chest pain with syncopal episode PTA. Notes sharp chest pain around 4pm. Arguing with father when pain started as he walked away. Notes diaphoresis. Radiation bilateral arms. Describes pain as sharp sensation and rated 10/10. As he was ambulating to his apartment he passed outi n the yard with syncopal episode. Witnessed Estimated duration 5min. No headache. No unilateral weakness/numbness. No fevers. No cough/congestion. States pain is 6/10 currently after NTG x 2. Notes relief with NTG. Seen on 08-31-16 for Chest pain and DCed home. D dimer negative at that time. No other symptoms noted.   Cardiologist- Dr. Donnie Ahoilley   Cardiac Catheterization 03-14-16 1. Severe triple vessel CAD with 50% distal left main stenosis.  2. Severe restenosis in the mid RCA stented (DES placed 24 months ago). Severe stenosis right posterolateral branch. 3. Moderately severe mid LAD stenosis. There is a moderate caliber diagonal branch that arises from this plaque. The Diagonal branch has an ostial 70% stenosis.  4. The Circumflex is a moderate caliber vessel with ostial 90% stenosis and mid 99% stenosis at the trifurcation of three OM branches.  5. Normal LV systolic function  Past Medical History:  Diagnosis Date  . Anginal pain (HCC)   . Coronary artery disease   . GERD (gastroesophageal reflux disease)   . History of acute pancreatitis 04/06/2015  . NSTEMI (non-ST elevated myocardial infarction) (HCC) 01/2014  . Pancreatitis   . PTSD (post-traumatic stress disorder)   . Tobacco abuse     Patient Active Problem List   Diagnosis Date Noted  .  S/P CABG x 5 03/19/2016  . CAD (coronary artery disease), native coronary artery 03/15/2016  . Pancreatic divisum 04/18/2015  . History of acute pancreatitis 04/06/2015  . Alcohol abuse 03/09/2015  . Fatty liver 03/09/2015  . NSTEMI (non-ST elevated myocardial infarction) (HCC) 01/18/2014  . Tobacco abuse     Past Surgical History:  Procedure Laterality Date  . CARDIAC CATHETERIZATION N/A 03/14/2016   Procedure: Left Heart Cath and Coronary Angiography;  Surgeon: Kathleene Hazelhristopher D McAlhany, MD;  [facility];  Service: Cardiovascular;  Laterality: N/A;  . CORONARY ANGIOPLASTY WITH STENT PLACEMENT  01/18/2014   "1"  . CORONARY ARTERY BYPASS GRAFT N/A 03/19/2016   Procedure: CORONARY ARTERY BYPASS GRAFTING (CABG), ON PUMP, TIMES FIVE, USING LEFT INTERNAL MAMMARY ARTERY AND RIGHT GREATER SAPHENOUS VEIN HARVESTED ENDOSCOPICALLY;  Surgeon: Delight OvensEdward B Gerhardt, MD;  [facility];  Service: Open Heart Surgery;  Laterality: N/A;  LIMA-LAD SVG-OM SVG-DIAG SEQ SVG-PD-PL  . LEFT HEART CATHETERIZATION WITH CORONARY ANGIOGRAM N/A 01/18/2014   Procedure: LEFT HEART CATHETERIZATION WITH CORONARY ANGIOGRAM;  Surgeon: Runell GessJonathan J Berry, MD;  [facility];  Service: Cardiovascular;  Laterality: N/A;  . TEE WITHOUT CARDIOVERSION N/A 03/19/2016   Procedure: TRANSESOPHAGEAL ECHOCARDIOGRAM (TEE);  Surgeon: Delight OvensEdward B Gerhardt, MD;  [facility];  Service: Open Heart Surgery;  Laterality: N/A;       Home Medications    Prior to Admission medications   Medication Sig Start Date End Date Taking? Authorizing Provider  Aspirin-Salicylamide-Caffeine (BC HEADACHE POWDER PO)  Take 1 each by mouth daily as needed (pain).    [provider]  metoprolol tartrate (LOPRESSOR) 25 MG tablet Take 12.5 mg by mouth daily after breakfast.     [provider]  oxyCODONE-acetaminophen (PERCOCET) 5-325 MG tablet Take 1 tablet by mouth every 4 (four) hours as needed (for chest wall pain).  08/31/16   Molpus, John, MD    Family History Family History  Problem Relation Age of Onset  . Cancer Mother        Lung cancer  . Coronary artery disease Mother   . Heart failure Maternal Grandmother     Social History Social History  Substance Use Topics  . Smoking status: Former Smoker    Packs/day: 1.00    Years: 31.00    Types: Cigarettes  . Smokeless tobacco: Never Used  . Alcohol use 0.6 oz/week    1 Cans of beer per week     Allergies   Ibuprofen and Shellfish allergy   Review of Systems Review of Systems ROS reviewed and all are negative for acute change except as noted in the HPI.  Physical Exam Updated Vital Signs BP 132/79   Pulse (!) 109   Temp 97.9 F (36.6 C) (Oral)   Resp (!) 27   Ht 5\' 11"  (1.803 m)   Wt 72.6 kg (160 lb)   SpO2 92%   BMI 22.32 kg/m   Physical Exam  Constitutional: He is oriented to person, place, and time. Vital signs are normal. He appears well-developed and well-nourished.  HENT:  Head: Normocephalic and atraumatic.  Right Ear: Hearing normal.  Left Ear: Hearing normal.  No carotid bruits auscultated.   Eyes: Pupils are equal, round, and reactive to light. Conjunctivae and EOM are normal.  Neck: Normal range of motion. Neck supple.  Cardiovascular: Regular rhythm, normal heart sounds and intact distal pulses.  Tachycardia present.   Pulmonary/Chest: Effort normal and breath sounds normal.  Abdominal: Soft. There is no tenderness.  Musculoskeletal: Normal range of motion.  Neurological: He is alert and oriented to person, place, and time. He has normal strength. No cranial nerve deficit or sensory deficit.  Cranial Nerves:  II: Pupils equal, round, reactive to light III,IV, VI: ptosis not present, extra-ocular motions intact bilaterally  V,VII: smile symmetric, facial light touch sensation equal VIII: hearing grossly normal bilaterally  IX,X: midline uvula rise  XI: bilateral shoulder shrug equal and strong XII:  midline tongue extension  Skin: Skin is warm and dry.  Psychiatric: He has a normal mood and affect. His speech is normal and behavior is normal. Thought content normal.  Nursing note and vitals reviewed.  ED Treatments / Results  Labs (all labs ordered are listed, but only abnormal results are displayed) Labs Reviewed  BASIC METABOLIC PANEL - Abnormal; Notable for the following:       Result Value   Chloride 112 (*)    CO2 18 (*)    Calcium 8.1 (*)    All other components within normal limits  HEPATIC FUNCTION PANEL - Abnormal; Notable for the following:    Total Protein 5.2 (*)    Albumin 2.9 (*)    ALT 13 (*)    Total Bilirubin 0.2 (*)    Indirect Bilirubin 0.0 (*)    All other components within normal limits  CBC  D-DIMER, QUANTITATIVE (NOT AT [facility])  LIPASE, BLOOD  I-STAT TROPONIN, ED    EKG  EKG Interpretation  Date/Time:  Monday October 08 2016 18:09:09  EDT Ventricular Rate:  108 PR Interval:    QRS Duration: 93 QT Interval:  359 QTC Calculation: 482 R Axis:   79 Text Interpretation:  Sinus tachycardia Borderline prolonged QT interval No significant change since last tracing Confirmed by Marily Memos [phone]) on 10/08/2016 7:10:01 PM       Radiology Dg Chest 2 View  Result Date: 10/08/2016 CLINICAL DATA:  Intermittent mid and left chest pain for the past week. Bilateral arm numbness. EXAM: CHEST  2 VIEW COMPARISON:  09/07/2016. FINDINGS: Normal sized heart. Post CABG changes. Clear lungs with normal vascularity. Unremarkable bones. IMPRESSION: No acute abnormality. Electronically Signed   By: Beckie Salts M.D.   On: 10/08/2016 19:00    Procedures Procedures (including critical care time)  Medications Ordered in ED Medications  morphine 4 MG/ML injection 4 mg (4 mg Intravenous Given 10/08/16 1839)     Initial Impression / Assessment and Plan / ED Course  I have reviewed the triage vital signs and the nursing notes.  Pertinent labs & imaging results that  were available during my care of the patient were reviewed by me and considered in my medical decision making (see chart for details).  Final Clinical Impressions(s) / ED Diagnoses  {I have reviewed and evaluated the relevant laboratory values. {I have reviewed and evaluated the relevant imaging studies. {I have interpreted the relevant EKG. {I have reviewed the relevant previous healthcare records. {I have reviewed EMS Documentation. {I obtained HPI from historian. {Patient discussed with supervising physician.  ED Course:  Assessment: Pt is a 51 y.o. male with a hx of coronary artery disease status post CABG in January of this year, presents to the Emergency Department today due to chest pain with syncopal episode PTA. Notes sharp chest pain around 4pm. Arguing with father when pain started as he walked away. Notes diaphoresis. Radiation bilateral arms. Describes pain as sharp sensation and rated 10/10. As he was ambulating to his apartment he passed outi n the yard with syncopal episode. Witnessed Estimated duration . No headache. No unilateral weakness/numbness. No fevers. No cough/congestion. States pain is 6/10 currently after NTG x 2. Notes relief with NTG. Seen on 08-31-16 for Chest pain and DCed home. D dimer negative at that time. On exam, pt in NAD. Nontoxic/nonseptic appearing. VS with tachycardia. Afebrile. Lungs CTA. Abdomen nontender soft. Worrisome for ACS vs possible PE given syncopal episode. CBC unremarkable. CMP unremarkable. Lipase negative. Trop negative. EKG with sinus tachycardia. CXR unremarkable. D Dimer negative. Given analgesia and antiemetics in ED. Given significant ACS hx. Will Consult cardiology and plan is to Admit to medicine.   Disposition/Plan:  Admit Pt acknowledges and agrees with plan  Supervising Physician Mesner, Barbara Cower, MD  Final diagnoses:  Chest pain, unspecified type    New Prescriptions New Prescriptions   No medications on file     Audry Pili, Cordelia Poche 10/08/16 1953    Mesner, Barbara Cower, MD 10/09/16 [phone]

## 2016-10-08 NOTE — ED Notes (Signed)
PT Also given 4mg  zofran in route

## 2016-10-09 ENCOUNTER — Observation Stay (HOSPITAL_BASED_OUTPATIENT_CLINIC_OR_DEPARTMENT_OTHER): Payer: Self-pay

## 2016-10-09 DIAGNOSIS — I25118 Atherosclerotic heart disease of native coronary artery with other forms of angina pectoris: Secondary | ICD-10-CM

## 2016-10-09 DIAGNOSIS — R079 Chest pain, unspecified: Secondary | ICD-10-CM

## 2016-10-09 DIAGNOSIS — R55 Syncope and collapse: Secondary | ICD-10-CM

## 2016-10-09 DIAGNOSIS — R0782 Intercostal pain: Secondary | ICD-10-CM

## 2016-10-09 DIAGNOSIS — Z720 Tobacco use: Secondary | ICD-10-CM

## 2016-10-09 DIAGNOSIS — F1010 Alcohol abuse, uncomplicated: Secondary | ICD-10-CM

## 2016-10-09 DIAGNOSIS — I2510 Atherosclerotic heart disease of native coronary artery without angina pectoris: Secondary | ICD-10-CM

## 2016-10-09 LAB — CREATININE, SERUM
Creatinine, Ser: 1.01 mg/dL (ref 0.61–1.24)
GFR calc Af Amer: >60 mL/min (ref >60)
GFR calc non Af Amer: >60 mL/min (ref >60)

## 2016-10-09 LAB — ECHOCARDIOGRAM COMPLETE
Height: 71 in
WEIGHTICAEL: 2560 [oz_av]

## 2016-10-09 LAB — TROPONIN I
Troponin I: <0.03 ng/mL (ref <0.03)
Troponin I: <0.03 ng/mL (ref <0.03)
Troponin I: <0.03 ng/mL (ref <0.03)

## 2016-10-09 LAB — HIV ANTIBODY (ROUTINE TESTING W REFLEX): HIV Screen 4th Generation wRfx: NONREACTIVE

## 2016-10-09 MED ORDER — ORAL CARE MOUTH RINSE: 15.0000 mL | Freq: Two times a day (BID) | OROMUCOSAL | Status: DC

## 2016-10-09 MED ORDER — THIAMINE HCL 100 MG/ML IJ SOLN
100.0000 mg | Freq: Every day | INTRAMUSCULAR | Status: DC
  Administered 2016-10-09: 100 mg via INTRAVENOUS
  Filled 2016-10-09: qty 2

## 2016-10-09 NOTE — Discharge Instructions (Signed)
For chest pain: try Motrin 600 mg every 8 hours x 3 doses.  (Do not take Goody powders while on it)

## 2016-10-09 NOTE — Progress Notes (Signed)
Subjective:  Admitted yesterday with chest discomfort that was sharp.  Occurred following an argument at the pool and which she stormed out and walked home.  He then had a syncopal episode after walking back to his apartment and EMS was called.  EKGs and troponins have been unremarkable.  He still complains of chest wall soreness and a sharp type chest pain on the left chest area.  He has been smoking intermittently.  Objective:  Vital Signs in the last 24 hours: BP (!) 144/85   Pulse 78   Temp 97.9 F (36.6 C) (Oral)   Resp 10   Ht 5\' 11"  (1.803 m)   Wt 72.6 kg (160 lb)   SpO2 95%   BMI 22.32 kg/m   Physical Exam: Anxious appearing male in no acute distress Lungs:  Clear Cardiac:  Regular rhythm, normal S1 and S2, no S3, left chest wall and sternum is tender to palpation Extremities:  No edema present  Intake/Output from previous day: 07/30 0701 - 07/31 0700 In: 1000 [IV Piggyback:1000] Out: 250 [Urine:250]  Weight Filed Weights   10/08/16 1801  Weight: 72.6 kg (160 lb)    Lab Results: Basic Metabolic Panel:  Recent Labs  82/95/6207/30/18 1811 10/08/16 2336  NA 141  --   K 3.7  --   CL 112*  --   CO2 18*  --   GLUCOSE 99  --   BUN 11  --   CREATININE 1.11 1.01   CBC:  Recent Labs  10/08/16 1811 10/08/16 2336  WBC 9.3 8.1  HGB 14.2 14.8  HCT 42.0 44.0  MCV 94.0 94.4  PLT 217 213   Cardiac Enzymes: Troponin (Point of Care Test)  Recent Labs  10/08/16 1919  TROPIPOC 0.00   Cardiac Panel (last 3 results)  Recent Labs  10/08/16 2336 10/09/16 0350  TROPONINI <0.03 <0.03    Telemetry: Sinus rhythm  Assessment/Plan:  1.  Chest pain sounds atypical for cardiac type chest pain-chest x-ray and EKG are unremarkable 2.  Coronary artery disease with previous bypass grafting 3.  Noncompliance 4.  Tobacco abuse  Recommendations:  His pain is really not cardiac tube description.  I would repeat an EKG and have him get up and ambulate.  Could probably  go home later today without additional cardiovascular workup.     [name]. Spencer Latonya Knight, [name]  MD [facility]  10/09/2016, 9:40 AM

## 2016-10-09 NOTE — Progress Notes (Signed)
  Echocardiogram 2D Echocardiogram has been performed.  Leta JunglingCooper, Naiya Corral M 10/09/2016, 10:29 AM

## 2016-10-09 NOTE — Progress Notes (Signed)
Discussed with the patient and all questioned fully answered. He will call me if any problems arise.  IV removed. Telemetry removed, CCMD notified. Pt verbalized understanding of all discharge instructions.   Leonidas Rombergaitlin S Bumbledare, RN

## 2016-10-09 NOTE — Discharge Summary (Signed)
Discharge Summary  Minerva EndsJonathan Zieske ZOX:[id number] DOB: 1965/06/05  PCP: Patient, No Pcp Per  Admit date: 10/08/2016 Discharge date: 10/09/2016  Time spent: 25 minutes   Recommendations for Outpatient Follow-up:  1. Motrin 600 mg every 8 hours 3 doses for musculoskeletal chest pain 2. Patient counseled to resume beta blocker   Discharge Diagnoses:  Active Hospital Problems   Diagnosis Date Noted  . Chest pain 10/08/2016  . Syncope 10/08/2016  . S/P CABG x 5 03/19/2016  . CAD (coronary artery disease), native coronary artery 03/15/2016  . Tobacco abuse     Resolved Hospital Problems   Diagnosis Date Noted Date Resolved  No resolved problems to display.    Discharge Condition: Improved, being discharged home   Diet recommendation: Heart healthy   Vitals:   10/09/16 1200 10/09/16 1300  BP: (!) 148/95 (!) 139/91  Pulse: 76 66  Resp: (!) 21 (!) 21  Temp: 98 F (36.7 C) 98.1 F (36.7 C)    History of present illness:   For 51-year-old male with history of CAD, alcohol abuse with history of alcoholic pancreatitis and tobacco abuse presented to the emergency room on the night of 7/30 with complaints of chest pain and loss of consciousness.  Hospital Course:  Principal Problem:   Chest pain: Enzymes 3 negative. See my cardiology. Patient tender chest wall area felt more likely be discussed skeletal. Enzymes 3 negative, stable. Chest pain not felt to be acute coronary syndrome. Advised for a scheduled course of Motrin over 24 hours for musculoskeletal chest Active Problems:   Tobacco abuse   CAD (coronary artery disease), native coronary artery S/P CABG x 5: Stable. As above. Not felt to be cardiac this time.  Of note, patient's echocardiogram did comment on some hypokinesis which could be from posterior circulation ischemia. I discussed this with cardiology, Dr. Donnie Ahoilley, who reviewed the echo further, but at this time felt that no immediate intervention would be needed and  he will see the patient in the office as scheduled.   Syncope: Unclear etiology. May have been dehydration. Echocardiogram normal and EKG and repeat unrevealing Alcohol abuse: Counseled. No signs of withdrawal    Procedures:  Echocardiogram done 7/31: Hypokinesis of the basal-mid inferoseptal myocardium;   consistent with ischemia in the distribution of the posterior descending indicating artery. Preserved ejection fraction. Normal diastolic dysfunction.  Consultations:   Cardiology  Discharge Exam: BP (!) 139/91 (BP Location: Left Arm)   Pulse 66   Temp 98.1 F (36.7 C) (Oral)   Resp (!) 21   Ht 5\' 11"  (1.803 m)   Wt 72.6 kg (160 lb)   SpO2 96%   BMI 22.32 kg/m   General:  Alert and oriented 3, no acute distress  Cardiovascular:  Regular rate and rhythm, S1-S2  Respiratory:  Clear to auscultation bilaterally   Discharge Instructions You were cared for by a hospitalist during your hospital stay. If you have any questions about your discharge medications or the care you received while you were in the hospital after you are discharged, you can call the unit and asked to speak with the hospitalist on call if the hospitalist that took care of you is not available. Once you are discharged, your primary care physician will handle any further medical issues. Please note that NO REFILLS for any discharge medications will be authorized once you are discharged, as it is imperative that you return to your primary care physician (or establish a relationship with a primary care physician if  you do not have one) for your aftercare needs so that they can reassess your need for medications and monitor your lab values.  Discharge Instructions    Diet - low sodium heart healthy    Complete by:  As directed    Increase activity slowly    Complete by:  As directed      Allergies as of 10/09/2016      Reactions   Ibuprofen Nausea And Vomiting   UPSETS STOMACH   Shellfish Allergy Nausea And  Vomiting      Medication List    STOP taking these medications   BC HEADACHE POWDER PO     TAKE these medications   metoprolol tartrate 25 MG tablet Commonly known as:  LOPRESSOR Take 25 mg by mouth daily after breakfast.      Allergies  Allergen Reactions  . Ibuprofen Nausea And Vomiting    UPSETS STOMACH  . Shellfish Allergy Nausea And Vomiting      The results of significant diagnostics from this hospitalization (including imaging, microbiology, ancillary and laboratory) are listed below for reference.    Significant Diagnostic Studies: Dg Chest 2 View  Result Date: 10/08/2016 CLINICAL DATA:  Intermittent mid and left chest pain for the past week. Bilateral arm numbness. EXAM: CHEST  2 VIEW COMPARISON:  09/07/2016. FINDINGS: Normal sized heart. Post CABG changes. Clear lungs with normal vascularity. Unremarkable bones. IMPRESSION: No acute abnormality. Electronically Signed   By: Beckie SaltsSteven  Reid M.D.   On: 10/08/2016 19:00    Microbiology: No results found for this or any previous visit (from the past 240 hour(s)).   Labs: Basic Metabolic Panel:  Recent Labs Lab 10/08/16 1811 10/08/16 2336  NA 141  --   K 3.7  --   CL 112*  --   CO2 18*  --   GLUCOSE 99  --   BUN 11  --   CREATININE 1.11 1.01  CALCIUM 8.1*  --    Liver Function Tests:  Recent Labs Lab 10/08/16 1811  AST 18  ALT 13*  ALKPHOS 81  BILITOT 0.2*  PROT 5.2*  ALBUMIN 2.9*    Recent Labs Lab 10/08/16 1811  LIPASE 30   No results for input(s): AMMONIA in the last 168 hours. CBC:  Recent Labs Lab 10/08/16 1811 10/08/16 2336  WBC 9.3 8.1  HGB 14.2 14.8  HCT 42.0 44.0  MCV 94.0 94.4  PLT 217 213   Cardiac Enzymes:  Recent Labs Lab 10/08/16 2336 10/09/16 0350 10/09/16 0939  TROPONINI <0.03 <0.03 <0.03   BNP: BNP (last 3 results)  Recent Labs  03/14/16 0630  BNP 27.9    ProBNP (last 3 results) No results for input(s): PROBNP in the last 8760 hours.  CBG: No  results for input(s): GLUCAP in the last 168 hours.     Signed:  Hollice EspyKRISHNAN,Vanita Cannell K, MD [facility] 10/09/2016, 4:23 PM

## 2016-10-09 NOTE — Care Management Note (Signed)
Case Management Note Donn PieriniKristi Evangelynn Lochridge RN, BSN Unit 4E-Case Manager [phone]  Patient Details  Name: Minerva EndsJonathan Favero MRN: [id number] Date of Birth: 04-Jul-1965  Subjective/Objective:   Pt presented with syncope                 Action/Plan: PTA pt lived at home- independent- plan to return home- noted pt does not have PCP- spoke with pt at bedside- pt provided list of clinics that see non-insured patients and [facility]. Also discussed medications pt states he works and has income- does not have difficulty getting medications and no new prescriptions noted for discharge.  Pt for d/c home today  Expected Discharge Date:  10/09/16               Expected Discharge Plan:  Home/Self Care  In-House Referral:     Discharge planning Services  CM Consult, [facility]  Post Acute Care Choice:  NA Choice offered to:  NA  DME Arranged:    DME Agency:     HH Arranged:    HH Agency:     Status of Service:  Completed, signed off  If discussed at [employer] Length of Stay Meetings, dates discussed:    Discharge Disposition: home/self care   Additional Comments:  Darrold SpanWebster, Joahan Swatzell Hall, RN 10/09/2016, 4:27 PM

## 2016-11-01 NOTE — Addendum Note (Signed)
Addendum  created 11/01/16 0920 by Jermaine Neuharth, MD   Sign clinical note    

## 2017-02-06 IMAGING — CT CT ABDOMEN W/ CM
2 of 5 series · 15 of 46 positions shown, 17 images · IV contrast (Omni 300)
Comparison: CT scan 06/17/2015 and MRI 04/16/2015

CLINICAL DATA: History of pancreatitis.  Possible pancreatic mass.

EXAM:
CT ABDOMEN WITH CONTRAST
TECHNIQUE: Multidetector CT imaging of the abdomen was performed using the
standard protocol following bolus administration of intravenous
contrast.
CONTRAST:  100mL DOG88L-D00 IOPAMIDOL (DOG88L-D00) INJECTION 61%

[Series 2: a/p w/ 5mm · axial · 0.73mm/px · z∈[-518,-98]mm · 12 of 96 slices shown, 14 images]
[im 6/96  soft-tissue]
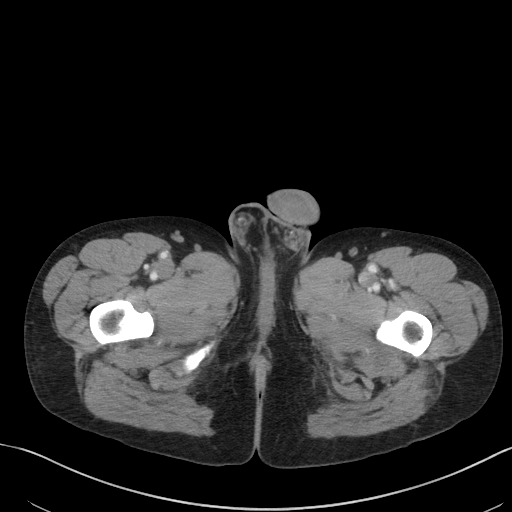
[im 6/96  bone]
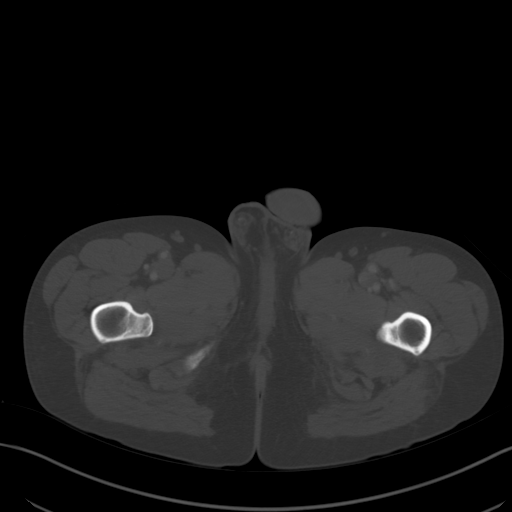
[im 17/96  soft-tissue]
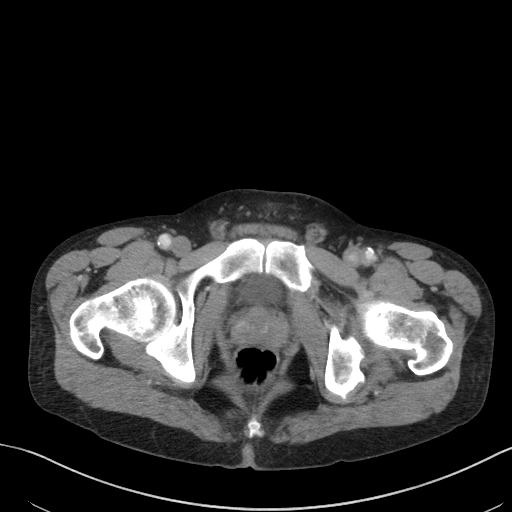
[im 23/96  soft-tissue]
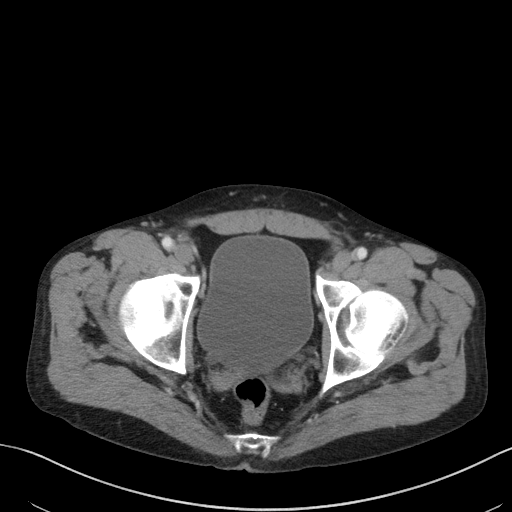
[im 28/96  soft-tissue]
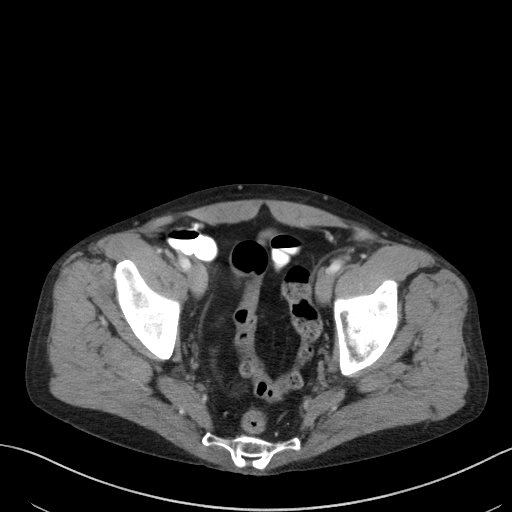
[im 40/96  soft-tissue]
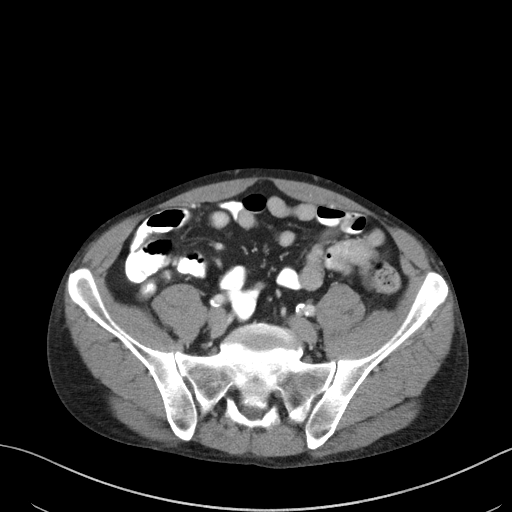
[im 45/96  soft-tissue]
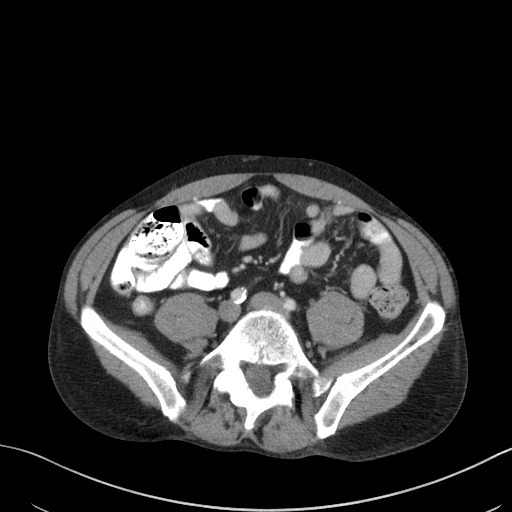
[im 51/96  soft-tissue]
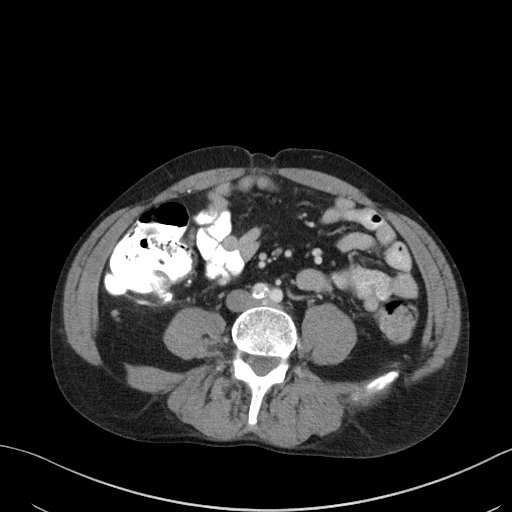
[im 62/96  soft-tissue]
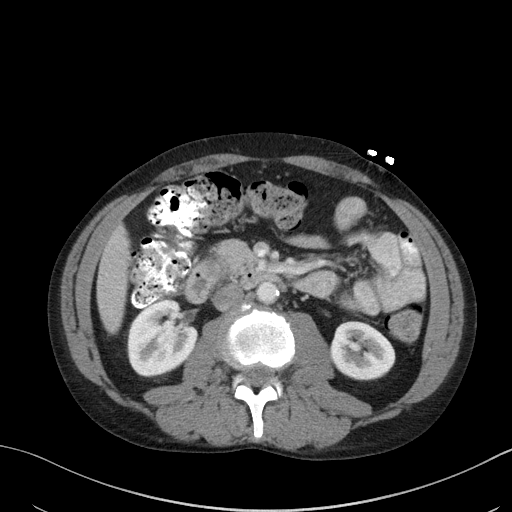
[im 68/96  soft-tissue]
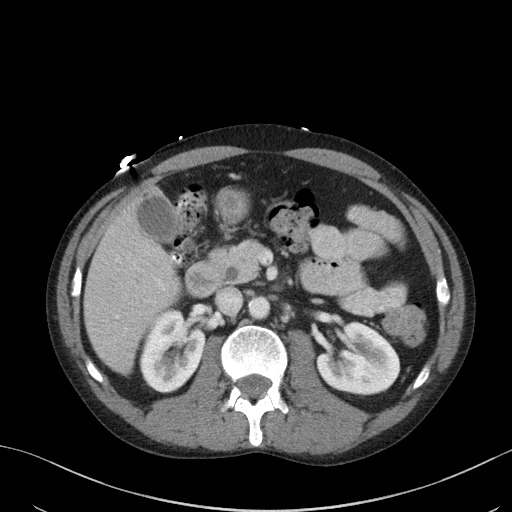
[im 68/96  bone]
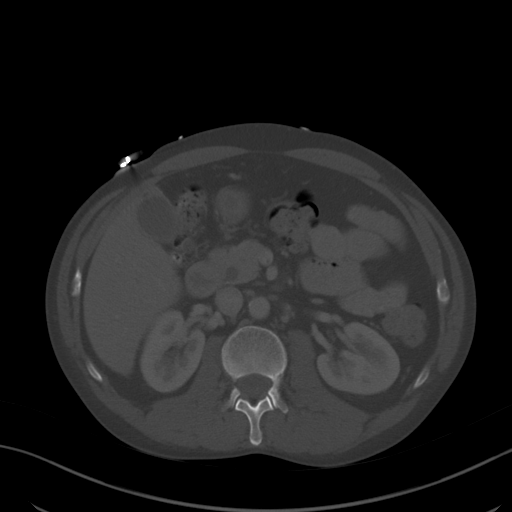
[im 73/96  soft-tissue]
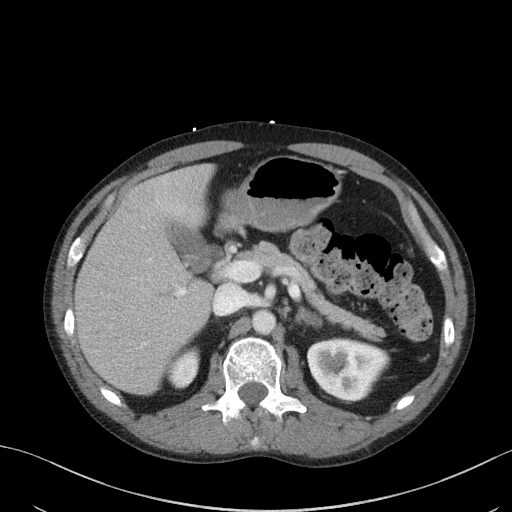
[im 84/96  soft-tissue]
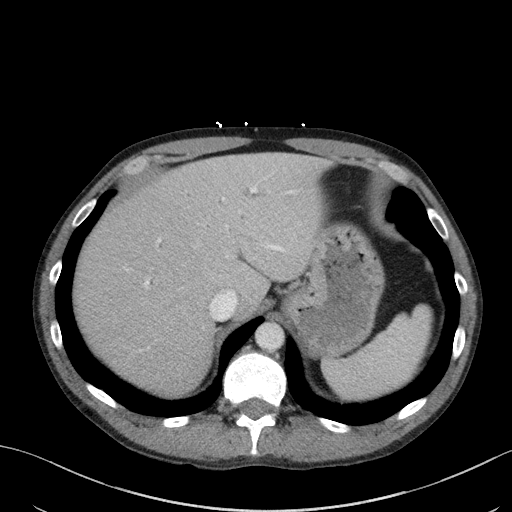
[im 90/96  soft-tissue]
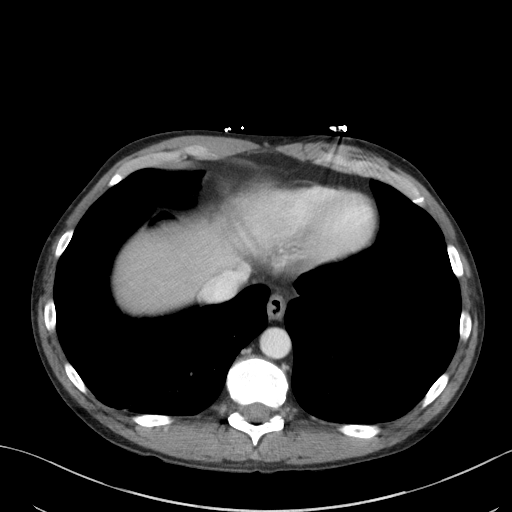

[Series 8: a/p w/ cor · coronal · 0.68mm/px · 3 of 122 slices shown]
[im 41/122  soft-tissue]
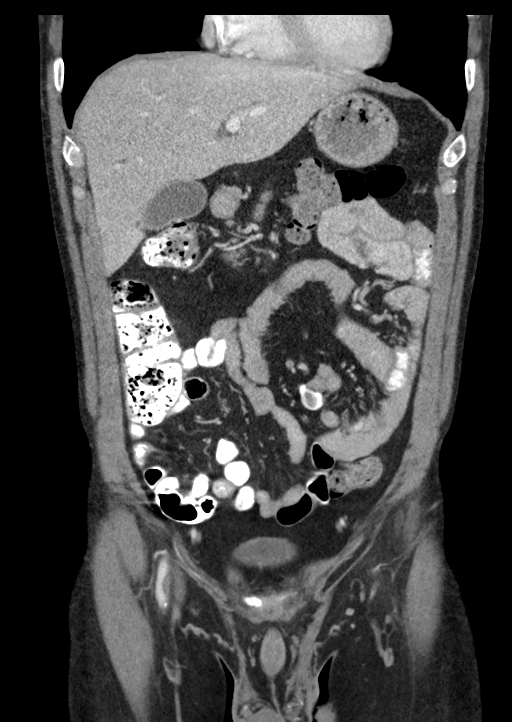
[im 54/122  soft-tissue]
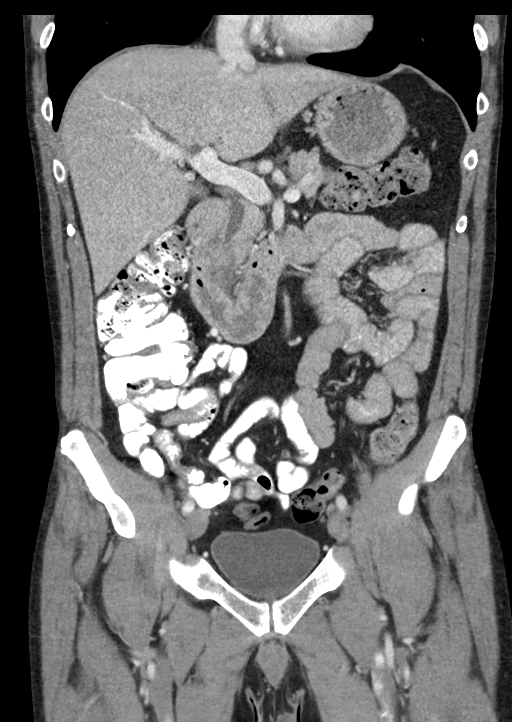
[im 68/122  soft-tissue]
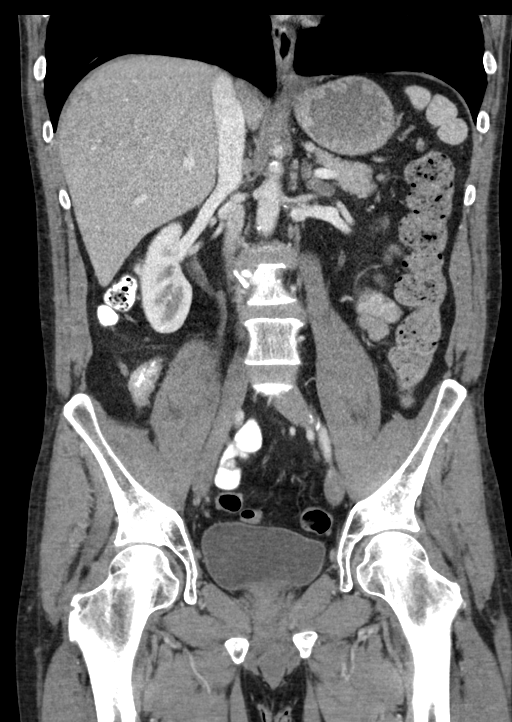

[15 of 46 positions shown; findings below may reference images not displayed]

FINDINGS: Lower chest: Emphysematous changes noted and minimal bibasilar
atelectasis. No infiltrates or effusions. No worrisome pulmonary
lesions. The heart is normal in size. No pericardial effusion.
Coronary artery calcifications are noted. The distal esophagus is
grossly normal.

Hepatobiliary: Stable mild intrahepatic biliary dilatation and mild
dilatation of the common bile duct. No common bile duct stones. The
gallbladder appears normal. No focal hepatic lesions.

Pancreas: Some persistent low-attenuation and strandy ill-defined
density between the pancreatic head in the duodenum. This is most
likely chronic groove pancreatitis. This was much more significant
on the CT scan from 03/05/2015. No findings for acute pancreatitis.
No pancreatic ductal dilatation.

Spleen: Normal size.  No focal lesions.

Adrenals/Urinary Tract: The adrenal glands and kidneys are
unremarkable and stable. No renal or ureteral calculi. No bladder
calculi. No renal or bladder mass.

Stomach/Bowel: PE stomach, duodenum, small bowel and colon are
unremarkable. No inflammatory changes, mass lesions or obstructive
findings. The terminal ileum is normal. The appendix is normal.

Vascular/Lymphatic: Stable moderate atherosclerotic calcifications
involving the iliac arteries. Minimal scattered aortic
calcifications. No aneurysm or dissection. The branch vessels are
patent. The major venous structures are patent.

Small scattered mesenteric and retroperitoneal lymph nodes are
stable. No mass or overt adenopathy.

Other: The prostate gland and seminal vesicles are unremarkable. No
pelvic mass or adenopathy. No free pelvic fluid collections. No
inguinal mass or adenopathy.

Musculoskeletal: No significant bony findings. Incidental unilateral
pars defect on the left at L5.
IMPRESSION: 1. CT findings most consistent with chronic groove pancreatitis.
Chronic inflammation between the pancreatic head and duodenum but
overall much improved since prior studies. No pancreatic mass is
identified.
2. Stable mild chronic intra and extrahepatic biliary dilatation
likely due to distal common bile duct stricture/narrowing due to
chronic pancreatitis.
3. Age advanced atherosclerotic calcifications involving the iliac
arteries.

## 2017-02-11 IMAGING — CR DG CHEST 1V PORT
1 series · 1 of 1 positions shown · non-contrast
Comparison: 03/19/2016

CLINICAL DATA: Status post coronary bypass grafting

EXAM:
PORTABLE CHEST 1 VIEW

[AP]
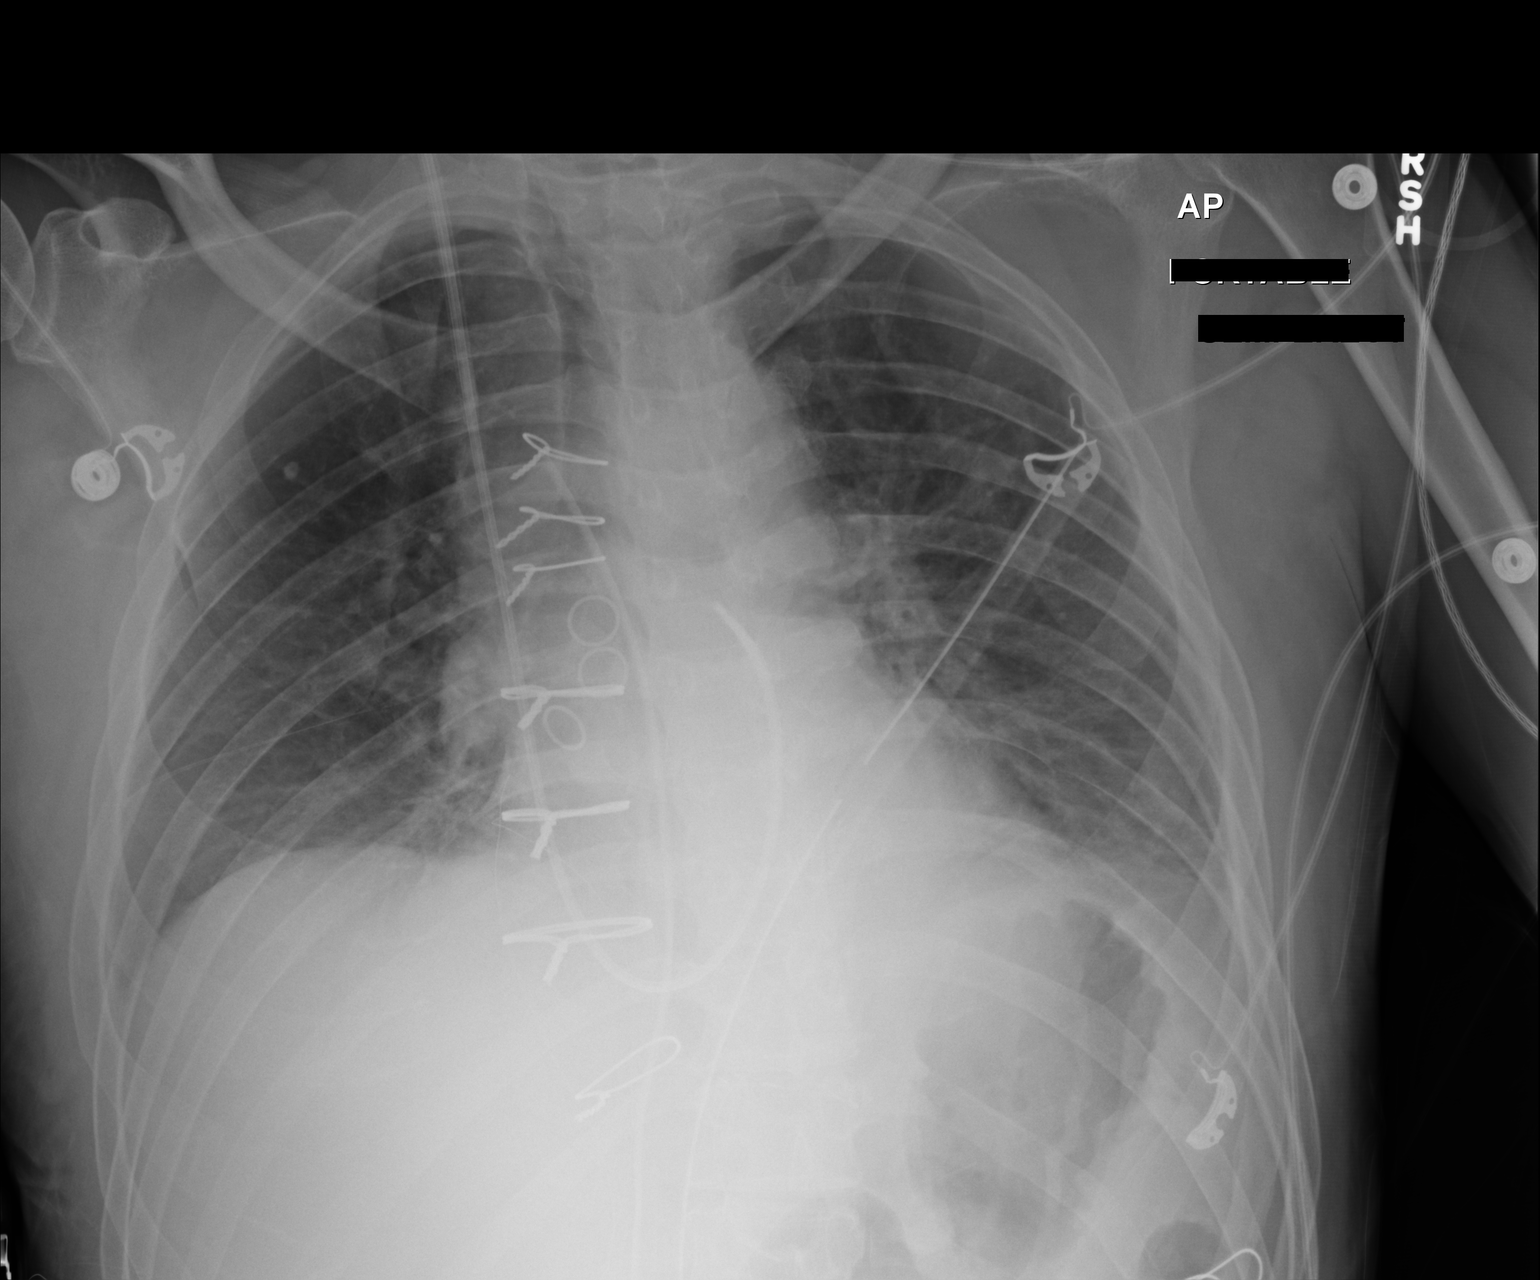

[1 of 1 positions shown; findings below may reference images not displayed]

FINDINGS: Cardiac shadow is stable. The endotracheal tube and nasogastric
catheter have been removed in the interval. Swan-Ganz catheter,
mediastinal drain and left thoracostomy catheter are again noted and
stable. Mild bibasilar atelectasis is seen. The inspiratory effort
is less than that seen on prior exam. Stable calcification in the
right lung is noted.
IMPRESSION: Mild bibasilar atelectasis likely related to poor inspiratory
effort.

Tubes and lines as described.

## 2017-04-09 ENCOUNTER — Encounter (HOSPITAL_COMMUNITY): Payer: Self-pay | Admitting: Emergency Medicine

## 2017-04-09 ENCOUNTER — Emergency Department (HOSPITAL_COMMUNITY): Payer: Self-pay

## 2017-04-09 ENCOUNTER — Other Ambulatory Visit: Payer: Self-pay

## 2017-04-09 DIAGNOSIS — F1010 Alcohol abuse, uncomplicated: Secondary | ICD-10-CM | POA: Diagnosis present

## 2017-04-09 DIAGNOSIS — Y900 Blood alcohol level of less than 20 mg/100 ml: Secondary | ICD-10-CM | POA: Diagnosis present

## 2017-04-09 DIAGNOSIS — Z91013 Allergy to seafood: Secondary | ICD-10-CM

## 2017-04-09 DIAGNOSIS — R000 Tachycardia, unspecified: Secondary | ICD-10-CM | POA: Diagnosis present

## 2017-04-09 DIAGNOSIS — Z951 Presence of aortocoronary bypass graft: Secondary | ICD-10-CM

## 2017-04-09 DIAGNOSIS — E785 Hyperlipidemia, unspecified: Secondary | ICD-10-CM | POA: Diagnosis present

## 2017-04-09 DIAGNOSIS — Z8249 Family history of ischemic heart disease and other diseases of the circulatory system: Secondary | ICD-10-CM

## 2017-04-09 DIAGNOSIS — I252 Old myocardial infarction: Secondary | ICD-10-CM

## 2017-04-09 DIAGNOSIS — K861 Other chronic pancreatitis: Secondary | ICD-10-CM | POA: Diagnosis present

## 2017-04-09 DIAGNOSIS — Z886 Allergy status to analgesic agent status: Secondary | ICD-10-CM

## 2017-04-09 DIAGNOSIS — F4310 Post-traumatic stress disorder, unspecified: Secondary | ICD-10-CM | POA: Diagnosis present

## 2017-04-09 DIAGNOSIS — I70201 Unspecified atherosclerosis of native arteries of extremities, right leg: Secondary | ICD-10-CM | POA: Diagnosis present

## 2017-04-09 DIAGNOSIS — F17210 Nicotine dependence, cigarettes, uncomplicated: Secondary | ICD-10-CM | POA: Diagnosis present

## 2017-04-09 DIAGNOSIS — Z9114 Patient's other noncompliance with medication regimen: Secondary | ICD-10-CM

## 2017-04-09 DIAGNOSIS — T82855A Stenosis of coronary artery stent, initial encounter: Secondary | ICD-10-CM | POA: Diagnosis present

## 2017-04-09 DIAGNOSIS — Z9119 Patient's noncompliance with other medical treatment and regimen: Secondary | ICD-10-CM

## 2017-04-09 DIAGNOSIS — K219 Gastro-esophageal reflux disease without esophagitis: Secondary | ICD-10-CM | POA: Diagnosis present

## 2017-04-09 DIAGNOSIS — I700 Atherosclerosis of aorta: Secondary | ICD-10-CM | POA: Diagnosis present

## 2017-04-09 DIAGNOSIS — I10 Essential (primary) hypertension: Secondary | ICD-10-CM | POA: Diagnosis present

## 2017-04-09 DIAGNOSIS — I25110 Atherosclerotic heart disease of native coronary artery with unstable angina pectoris: Principal | ICD-10-CM | POA: Diagnosis present

## 2017-04-09 DIAGNOSIS — Z955 Presence of coronary angioplasty implant and graft: Secondary | ICD-10-CM

## 2017-04-09 LAB — CBC
HEMATOCRIT: 51.8 % (ref 39.0–52.0)
HEMOGLOBIN: 18.1 g/dL — AB (ref 13.0–17.0)
MCH: 33.5 pg (ref 26.0–34.0)
MCHC: 34.9 g/dL (ref 30.0–36.0)
MCV: 95.7 fL (ref 78.0–100.0)
Platelets: 254 10*3/uL (ref 150–400)
RBC: 5.41 MIL/uL (ref 4.22–5.81)
RDW: 13.6 % (ref 11.5–15.5)
WBC: 9.9 10*3/uL (ref 4.0–10.5)

## 2017-04-09 LAB — I-STAT TROPONIN, ED: Troponin i, poc: 0.01 ng/mL (ref 0.00–0.08)

## 2017-04-09 NOTE — ED Triage Notes (Signed)
Pt states for the past 2 night he has been having chest pain and today he states his pancreatitis has started flaring up  Pt states the chest pain is a tightness in the middle of his chest and he has pain in his abdomen

## 2017-04-10 ENCOUNTER — Emergency Department (HOSPITAL_COMMUNITY): Payer: Self-pay

## 2017-04-10 ENCOUNTER — Inpatient Hospital Stay (HOSPITAL_COMMUNITY)
Admission: EM | Admit: 2017-04-10 | Discharge: 2017-04-12 | DRG: 251 | Disposition: A | Discharge status: Home / Self Care | Payer: Self-pay | Attending: Internal Medicine | Admitting: Internal Medicine

## 2017-04-10 ENCOUNTER — Encounter (HOSPITAL_COMMUNITY): Payer: Self-pay

## 2017-04-10 DIAGNOSIS — R0782 Intercostal pain: Secondary | ICD-10-CM

## 2017-04-10 DIAGNOSIS — Z9861 Coronary angioplasty status: Secondary | ICD-10-CM

## 2017-04-10 DIAGNOSIS — Z91199 Patient's noncompliance with other medical treatment and regimen due to unspecified reason: Secondary | ICD-10-CM

## 2017-04-10 DIAGNOSIS — Z951 Presence of aortocoronary bypass graft: Secondary | ICD-10-CM

## 2017-04-10 DIAGNOSIS — Z8719 Personal history of other diseases of the digestive system: Secondary | ICD-10-CM

## 2017-04-10 DIAGNOSIS — I200 Unstable angina: Secondary | ICD-10-CM | POA: Diagnosis present

## 2017-04-10 DIAGNOSIS — I700 Atherosclerosis of aorta: Secondary | ICD-10-CM | POA: Diagnosis present

## 2017-04-10 DIAGNOSIS — Z9119 Patient's noncompliance with other medical treatment and regimen: Secondary | ICD-10-CM

## 2017-04-10 DIAGNOSIS — I2510 Atherosclerotic heart disease of native coronary artery without angina pectoris: Secondary | ICD-10-CM | POA: Diagnosis present

## 2017-04-10 DIAGNOSIS — F1010 Alcohol abuse, uncomplicated: Secondary | ICD-10-CM | POA: Diagnosis present

## 2017-04-10 DIAGNOSIS — R1031 Right lower quadrant pain: Secondary | ICD-10-CM

## 2017-04-10 DIAGNOSIS — R079 Chest pain, unspecified: Secondary | ICD-10-CM

## 2017-04-10 LAB — BASIC METABOLIC PANEL
Anion gap: 8 (ref 5–15)
BUN: 9 mg/dL (ref 6–20)
CALCIUM: 8.6 mg/dL — AB (ref 8.9–10.3)
CO2: 22 mmol/L (ref 22–32)
CREATININE: 1.06 mg/dL (ref 0.61–1.24)
Chloride: 105 mmol/L (ref 101–111)
GFR calc Af Amer: >60 mL/min (ref >60)
GFR calc non Af Amer: >60 mL/min (ref >60)
GLUCOSE: 104 mg/dL — AB (ref 65–99)
Potassium: 4.3 mmol/L (ref 3.5–5.1)
Sodium: 135 mmol/L (ref 135–145)

## 2017-04-10 LAB — CBC
HEMATOCRIT: 45.4 % (ref 39.0–52.0)
Hemoglobin: 15.9 g/dL (ref 13.0–17.0)
MCH: 33.2 pg (ref 26.0–34.0)
MCHC: 35 g/dL (ref 30.0–36.0)
MCV: 94.8 fL (ref 78.0–100.0)
PLATELETS: 225 10*3/uL (ref 150–400)
RBC: 4.79 MIL/uL (ref 4.22–5.81)
RDW: 13.8 % (ref 11.5–15.5)
WBC: 9.1 10*3/uL (ref 4.0–10.5)

## 2017-04-10 LAB — D-DIMER, QUANTITATIVE (NOT AT ARMC): D DIMER QUANT: 0.27 ug{FEU}/mL (ref 0.00–0.50)

## 2017-04-10 LAB — ETHANOL: Alcohol, Ethyl (B): <10 mg/dL (ref <10)

## 2017-04-10 LAB — TROPONIN I
Troponin I: <0.03 ng/mL (ref <0.03)
Troponin I: <0.03 ng/mL (ref <0.03)
Troponin I: <0.03 ng/mL (ref <0.03)

## 2017-04-10 LAB — CREATININE, SERUM
Creatinine, Ser: 1.06 mg/dL (ref 0.61–1.24)
GFR calc Af Amer: >60 mL/min (ref >60)

## 2017-04-10 LAB — LIPASE, BLOOD: LIPASE: 34 U/L (ref 11–51)

## 2017-04-10 LAB — HEPATIC FUNCTION PANEL
ALT: 18 U/L (ref 17–63)
AST: 26 U/L (ref 15–41)
Albumin: 3.6 g/dL (ref 3.5–5.0)
Alkaline Phosphatase: 86 U/L (ref 38–126)
Bilirubin, Direct: <0.1 mg/dL — ABNORMAL LOW (ref 0.1–0.5)
TOTAL PROTEIN: 6.5 g/dL (ref 6.5–8.1)
Total Bilirubin: 0.5 mg/dL (ref 0.3–1.2)

## 2017-04-10 MED ORDER — ASPIRIN 81 MG PO CHEW
324.0000 mg | CHEWABLE_TABLET | Freq: Once | ORAL | Status: AC
  Administered 2017-04-10: 324 mg via ORAL
  Filled 2017-04-10: qty 4

## 2017-04-10 MED ORDER — THIAMINE HCL 100 MG/ML IJ SOLN
100.0000 mg | Freq: Every day | INTRAMUSCULAR | Status: DC
  Filled 2017-04-10 (×2): qty 1

## 2017-04-10 MED ORDER — IOPAMIDOL (ISOVUE-300) INJECTION 61%
100.0000 mL | Freq: Once | INTRAVENOUS | Status: AC | PRN
  Administered 2017-04-10: 100 mL via INTRAVENOUS

## 2017-04-10 MED ORDER — MORPHINE SULFATE (PF) 4 MG/ML IV SOLN
4.0000 mg | Freq: Once | INTRAVENOUS | Status: AC
  Administered 2017-04-10: 4 mg via INTRAVENOUS
  Filled 2017-04-10: qty 1

## 2017-04-10 MED ORDER — DIAZEPAM 2 MG PO TABS
2.0000 mg | ORAL_TABLET | Freq: Three times a day (TID) | ORAL | Status: AC
  Administered 2017-04-10 – 2017-04-11 (×5): 2 mg via ORAL
  Filled 2017-04-10 (×5): qty 1

## 2017-04-10 MED ORDER — MORPHINE SULFATE (PF) 2 MG/ML IV SOLN
2.0000 mg | INTRAVENOUS | Status: DC | PRN
Start: 2017-04-10 — End: 2017-04-11
  Administered 2017-04-10 (×4): 2 mg via INTRAVENOUS
  Filled 2017-04-10 (×4): qty 1

## 2017-04-10 MED ORDER — ONDANSETRON HCL 4 MG/2ML IJ SOLN: 4.0000 mg | Freq: Four times a day (QID) | INTRAMUSCULAR | Status: DC | PRN

## 2017-04-10 MED ORDER — ASPIRIN EC 325 MG PO TBEC
325.0000 mg | DELAYED_RELEASE_TABLET | Freq: Every day | ORAL | Status: DC
  Administered 2017-04-10 – 2017-04-11 (×2): 325 mg via ORAL
  Filled 2017-04-10 (×2): qty 1

## 2017-04-10 MED ORDER — ACETAMINOPHEN 325 MG PO TABS
650.0000 mg | ORAL_TABLET | ORAL | Status: DC | PRN
  Administered 2017-04-10: 650 mg via ORAL
  Filled 2017-04-10: qty 2

## 2017-04-10 MED ORDER — LORAZEPAM 2 MG/ML IJ SOLN: 1.0000 mg | Freq: Four times a day (QID) | INTRAMUSCULAR | Status: DC | PRN

## 2017-04-10 MED ORDER — ENOXAPARIN SODIUM 40 MG/0.4ML ~~LOC~~ SOLN
40.0000 mg | SUBCUTANEOUS | Status: DC
  Administered 2017-04-10: 40 mg via SUBCUTANEOUS
  Filled 2017-04-10: qty 0.4

## 2017-04-10 MED ORDER — LORAZEPAM 1 MG PO TABS
1.0000 mg | ORAL_TABLET | Freq: Four times a day (QID) | ORAL | Status: DC | PRN
  Administered 2017-04-10: 1 mg via ORAL
  Filled 2017-04-10: qty 1

## 2017-04-10 MED ORDER — MORPHINE SULFATE (PF) 2 MG/ML IV SOLN
1.0000 mg | Freq: Once | INTRAVENOUS | Status: AC
  Administered 2017-04-10: 1 mg via INTRAVENOUS
  Filled 2017-04-10: qty 1

## 2017-04-10 MED ORDER — METOPROLOL TARTRATE 25 MG PO TABS
25.0000 mg | ORAL_TABLET | Freq: Two times a day (BID) | ORAL | Status: DC
  Administered 2017-04-10 – 2017-04-12 (×5): 25 mg via ORAL
  Filled 2017-04-10 (×5): qty 1

## 2017-04-10 MED ORDER — ADULT MULTIVITAMIN W/MINERALS CH
1.0000 | ORAL_TABLET | Freq: Every day | ORAL | Status: DC
  Administered 2017-04-10 – 2017-04-12 (×3): 1 via ORAL
  Filled 2017-04-10 (×3): qty 1

## 2017-04-10 MED ORDER — ONDANSETRON HCL 4 MG/2ML IJ SOLN
4.0000 mg | Freq: Once | INTRAMUSCULAR | Status: AC
  Administered 2017-04-10: 4 mg via INTRAVENOUS
  Filled 2017-04-10: qty 2

## 2017-04-10 MED ORDER — ROSUVASTATIN CALCIUM 10 MG PO TABS
10.0000 mg | ORAL_TABLET | Freq: Every day | ORAL | Status: DC
  Administered 2017-04-10 – 2017-04-11 (×2): 10 mg via ORAL
  Filled 2017-04-10 (×2): qty 1

## 2017-04-10 MED ORDER — NITROGLYCERIN 0.4 MG SL SUBL
0.4000 mg | SUBLINGUAL_TABLET | SUBLINGUAL | Status: DC | PRN
  Administered 2017-04-10: 0.4 mg via SUBLINGUAL
  Filled 2017-04-10: qty 1

## 2017-04-10 MED ORDER — FOLIC ACID 1 MG PO TABS
1.0000 mg | ORAL_TABLET | Freq: Every day | ORAL | Status: DC
  Administered 2017-04-10 – 2017-04-12 (×3): 1 mg via ORAL
  Filled 2017-04-10 (×3): qty 1

## 2017-04-10 MED ORDER — IOPAMIDOL (ISOVUE-300) INJECTION 61%
INTRAVENOUS | Status: AC
  Administered 2017-04-10: 100 mL via INTRAVENOUS
  Filled 2017-04-10: qty 100

## 2017-04-10 MED ORDER — VITAMIN B-1 100 MG PO TABS
100.0000 mg | ORAL_TABLET | Freq: Every day | ORAL | Status: DC
  Administered 2017-04-10 – 2017-04-12 (×3): 100 mg via ORAL
  Filled 2017-04-10 (×3): qty 1

## 2017-04-10 MED ORDER — SODIUM CHLORIDE 0.9 % IV BOLUS (SEPSIS)
1000.0000 mL | Freq: Once | INTRAVENOUS | Status: AC
  Administered 2017-04-10: 1000 mL via INTRAVENOUS

## 2017-04-10 NOTE — ED Provider Notes (Signed)
TIME SEEN: 2:25 AM  CHIEF COMPLAINT: Chest pain, abdominal pain  HPI: Patient is a 52 year old male with history of CAD status post NSTEMI in January 2018 with 5 vessel CABG, pancreatitis from history of alcohol abuse who presents to the emergency department with chest pain.  Describes it as a left-sided tight, sharp pain without radiation.  Pain intermittent for 2 days.  Worse with lying on the left side but not worse with palpation.  He does not think it is exertional.  He does have shortness of breath.  Reports this feels similar to when he had to have a 5 vessel CABG previously.  Has not had any stress test since that time.  Also reports abdominal pain in the right lower quadrant that is sharp and severe that started this morning.  States this feels similar to his pancreatitis.  Last drink alcohol 2 days ago.  Has not had nausea and vomiting but no diarrhea, bloody stools or melena.  No previous abdominal surgery.  ROS: See HPI Constitutional: no fever  Eyes: no drainage  ENT: no runny nose   Cardiovascular:   chest pain  Resp: SOB  GI:  vomiting GU: no dysuria Integumentary: no rash  Allergy: no hives  Musculoskeletal: no leg swelling  Neurological: no slurred speech ROS otherwise negative  PAST MEDICAL HISTORY/PAST SURGICAL HISTORY:  Past Medical History:  Diagnosis Date  . Anginal pain (HCC)   . Coronary artery disease   . GERD (gastroesophageal reflux disease)   . History of acute pancreatitis 04/06/2015  . NSTEMI (non-ST elevated myocardial infarction) (HCC) 01/2014  . Pancreatitis   . PTSD (post-traumatic stress disorder)   . Tobacco abuse     MEDICATIONS:  Prior to Admission medications   Medication Sig Start Date End Date Taking? Authorizing Provider  rosuvastatin (CRESTOR) 10 MG tablet Take 1 tablet (10 mg total) by mouth daily. Patient not taking: Reported on 03/05/2015 01/19/14 03/25/15  Othella Boyerilley, William S, MD    ALLERGIES:  Allergies  Allergen Reactions  .  Ibuprofen Nausea And Vomiting    UPSETS STOMACH  . Shellfish Allergy Nausea And Vomiting    SOCIAL HISTORY:  Social History   Tobacco Use  . Smoking status: Current Every Day Smoker    Packs/day: 1.00    Years: 31.00    Pack years: 31.00    Types: Cigarettes  . Smokeless tobacco: Never Used  Substance Use Topics  . Alcohol use: Yes    Alcohol/week: 0.6 oz    Types: 1 Cans of beer per week    Comment: occ    FAMILY HISTORY: Family History  Problem Relation Age of Onset  . Cancer Mother        Lung cancer  . Coronary artery disease Mother   . Heart failure Maternal Grandmother     EXAM: BP (!) 148/96 (BP Location: Left Arm) Comment: Simultaneous filing. User may not have seen previous data.  Pulse 100 Comment: Simultaneous filing. User may not have seen previous data.  Temp 98.3 F (36.8 C) (Oral)   Resp 14 Comment: Simultaneous filing. User may not have seen previous data.  Ht 5\' 11"  (1.803 m)   Wt 69.9 kg (154 lb)   SpO2 99% Comment: Simultaneous filing. User may not have seen previous data.  BMI 21.48 kg/m  CONSTITUTIONAL: Alert and oriented and responds appropriately to questions. Well-appearing; well-nourished HEAD: Normocephalic EYES: Conjunctivae clear, pupils appear equal, EOMI ENT: normal nose; moist mucous membranes NECK: Supple, no meningismus, no nuchal  rigidity, no LAD  CARD: Regular and tachycardic; S1 and S2 appreciated; no murmurs, no clicks, no rubs, no gallops RESP: Normal chest excursion without splinting or tachypnea; breath sounds clear and equal bilaterally; no wheezes, no rhonchi, no rales, no hypoxia or respiratory distress, speaking full sentences ABD/GI: Normal bowel sounds; non-distended; soft, tender in the right lower quadrant, no rebound, no guarding, no peritoneal signs, no hepatosplenomegaly BACK:  The back appears normal and is non-tender to palpation, there is no CVA tenderness EXT: Normal ROM in all joints; non-tender to palpation;  no edema; normal capillary refill; no cyanosis, no calf tenderness or swelling    SKIN: Normal color for age and race; warm; no rash NEURO: Moves all extremities equally PSYCH: The patient's mood and manner are appropriate. Grooming and personal hygiene are appropriate.  MEDICAL DECISION MAKING: Patient here with complaints of chest pain.  He states this feels similar to his anginal equivalent.  First troponin negative.  EKG shows no ischemic change.  Chest x-ray clear.  Given he has been tachycardic and has some pleuritic pain, will obtain d-dimer.  Patient will likely need admission for chest pain rule out.  Patient also complaining of abdominal pain.  Located in the right lower quadrant although he states this feels similar to his pancreatitis.  LFTs and lipase are normal.  Will obtain CT scan to evaluate for possible appendicitis.  Will give pain medicine, IV fluids, nausea medicine.  ED PROGRESS: CT scan unremarkable.  Appendix is normal.  Pancreas appears normal.  No acute abnormality seen within the abdomen and pelvis.  D-dimer negative.  Will admit for chest pain rule out.  Patient still having chest discomfort.  Will give aspirin and nitroglycerin.  Will discuss with medicine for admission.  He does not have a local primary care provider.   4:26 AM Discussed patient's case with hospitalist, Dr. Toniann Fail.  I have recommended admission and patient (and family if present) agree with this plan. Admitting physician will place admission orders.   I reviewed all nursing notes, vitals, pertinent previous records, EKGs, lab and urine results, imaging (as available).      EKG Interpretation  Date/Time:  Tuesday April 09 2017 22:44:18 EST Ventricular Rate:  125 PR Interval:    QRS Duration: 77 QT Interval:  315 QTC Calculation: 455 R Axis:   73 Text Interpretation:  Sinus tachycardia Probable left atrial enlargement No significant change since last tracing other than rate is faster  Confirmed by Eda Magnussen, Baxter Hire [phone]) on 04/10/2017 2:24:52 AM         Kaelani Kendrick, Layla Maw, DO 04/10/17 6045

## 2017-04-10 NOTE — Plan of Care (Signed)
  Nutrition: Adequate nutrition will be maintained 04/10/2017 1849 - Progressing by William Daltonarpenter, Travin Marik L, RN   Cardiac: Ability to achieve and maintain adequate cardiovascular perfusion will improve 04/10/2017 1849 - Progressing by William Daltonarpenter, Thessaly Mccullers L, RN   Elimination: Will not experience complications related to bowel motility 04/10/2017 1849 - Progressing by William Daltonarpenter, Deven Audi L, RN

## 2017-04-10 NOTE — ED Notes (Signed)
Trixie Rudeilley Williams, MD asked me to figure out who the admitting doctor is so that [facility] can be transferred to [facility]. [facility] and Rosalie GumsEmpoke., MD.

## 2017-04-10 NOTE — Consult Note (Signed)
Cardiology Consult Note  Admit date: 04/10/2017 Name: Douglas [name] 52 y.o.  male DOB:  05-Mar-1966 MRN:  [id number]  Today's date:  04/10/2017  Referring Physician:    [facility]  Reason for Consultation:    Chest pain  IMPRESSIONS: 1.  Chest pain with some typical other atypical features 2.  Accelerated hypertension 3.  Coronary artery disease with previous stenting of the RCA with noncompliance with coronary bypass grafting one year ago 4.  History of recurrent pancreatitis 5.  Tobacco abuse 6.  Medical noncompliance 7.  Aortic atherosclerosis  RECOMMENDATION: Patient has had severe noncompliance and has been off of all of his medicines.  His blood pressure is elevated.  My recommendations would be to transfer him to [facility] and to place him back on beta-blockers, lipid-lowering therapy and to continue to cycle enzymes.  Reinstitute blood pressure medications.  If his chest pain does not settle down following control of his hypertension we may need to proceed with catheterization to assess graft patency.  Compliance has been a very severe issue with him and we again discussed the importance of this.  HISTORY: He has a history of both alcohol and tobacco abuse as well as recurrent pancreatitis and noncompliance.  He was came into the hospital with a non-STEMI over a year ago and was found to have significant distal left main and three-vessel coronary artery disease and underwent bypass grafting by Dr. Laneta Simmers.  He had a staying in a nursing home following that and has really not had good follow-up with cardiology since then.  He was hospitalized with atypical chest pain in July of this year and had a myocardial infarction ruled out.  An echocardiogram at that time showed an EF of 50-55% with some inferior wall hypokinesis.  Over the past 2 months he has had no insurance and he stopped taking all of his medications.  He develops recurrent abdominal pain as well as chest  pain over the past few days.  He says that the pain resembles that of his previous cardiac pain with radiation into the left arm.  It will last up to an hour and a half sometimes is related to exertion and other times it is occurring at rest.  There does not appear to be any pattern for it.  He presented to the emergency room where he was found to be severely hypertensive and he had an initial negative EKG and enzymes except for sinus tachycardia.  He is not felt to have recurrent pancreatitis at the present time he does have some abdominal pain.  He has been smoking about a half a pack of cigarettes per day.  He denies PND orthopnea or edema.  Past Medical History:  Diagnosis Date  . Anginal pain (HCC)   . Coronary artery disease   . GERD (gastroesophageal reflux disease)   . History of acute pancreatitis 04/06/2015  . NSTEMI (non-ST elevated myocardial infarction) (HCC) 01/2014  . Pancreatitis   . PTSD (post-traumatic stress disorder)   . Tobacco abuse      Past Surgical History:  Procedure Laterality Date  . CARDIAC CATHETERIZATION N/A 03/14/2016   Procedure: Left Heart Cath and Coronary Angiography;  Surgeon: Kathleene Hazel, MD;  Location: [facility] INVASIVE CV LAB;  Service: Cardiovascular;  Laterality: N/A;  . CORONARY ANGIOPLASTY WITH STENT PLACEMENT  01/18/2014   "1"  . CORONARY ARTERY BYPASS GRAFT N/A 03/19/2016   Procedure: CORONARY ARTERY BYPASS GRAFTING (CABG), ON PUMP, TIMES FIVE, USING LEFT INTERNAL  MAMMARY ARTERY AND RIGHT GREATER SAPHENOUS VEIN HARVESTED ENDOSCOPICALLY;  Surgeon: Delight Ovens, MD;  Location: [facility] OR;  Service: Open Heart Surgery;  Laterality: N/A;  LIMA-LAD SVG-OM SVG-DIAG SEQ SVG-PD-PL  . LEFT HEART CATHETERIZATION WITH CORONARY ANGIOGRAM N/A 01/18/2014   Procedure: LEFT HEART CATHETERIZATION WITH CORONARY ANGIOGRAM;  Surgeon: Runell Gess, MD;  Location: [facility] CATH LAB;  Service: Cardiovascular;  Laterality: N/A;  . TEE WITHOUT CARDIOVERSION N/A 03/19/2016    Procedure: TRANSESOPHAGEAL ECHOCARDIOGRAM (TEE);  Surgeon: Delight Ovens, MD;  Location: [facility] OR;  Service: Open Heart Surgery;  Laterality: N/A;    Allergies:  is allergic to ibuprofen and shellfish allergy.   Medications: Prior to Admission medications   Medication Sig Start Date End Date Taking? Authorizing Provider  rosuvastatin (CRESTOR) 10 MG tablet Take 1 tablet (10 mg total) by mouth daily. Patient not taking: Reported on 03/05/2015 01/19/14 03/25/15  Othella Boyer, MD   Family History: Family Status  Relation Name Status  . Father  (Not Specified)       muscular dystrophy  . Mother  Deceased       dementia/lung cancer  . Sister  Alive  . MGM  (Not Specified)   Social History:   reports that he has been smoking cigarettes.  He has a 31.00 pack-year smoking history. he has never used smokeless tobacco. He reports that he drinks about 0.6 oz of alcohol per week. He reports that he does not use drugs.   Social History   Social History Narrative   Divorced.  2 sons.  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    Review of Systems: Has intermittent abdominal pain.  Has significant anxiety and depression as well as previous PTSD.  Other than as noted above the remainder of the review of systems is unremarkable.  Physical Exam: BP 122/77 (BP Location: Left Arm)   Pulse 76   Temp 98.3 F (36.8 C) (Oral)   Resp 13   Ht 5\' 11"  (1.803 m)   Wt 69.9 kg (154 lb)   SpO2 96%   BMI 21.48 kg/m   General appearance: uncooperative Head: Normocephalic, without obvious abnormality, atraumatic Eyes: conjunctivae/corneas clear. PERRL, EOM's intact. Fundi not examined Neck: no adenopathy, no carotid bruit, no JVD and supple, symmetrical, trachea midline Lungs: clear to auscultation bilaterally and Healed median sternotomy scar Heart: regular rate and rhythm, S1, S2 normal, no murmur, click, rub or gallop Abdomen: Soft, mildly tender in the  epigastric area without rebound Rectal: deferred Extremities: extremities normal, atraumatic, no cyanosis or edema, healed sv harvesting scars in legs Pulses: 2+ and symmetric Skin: Skin color, texture, turgor normal. No rashes or lesions Neurologic: Grossly normal Psych: Alert and oriented x 3 Labs: CBC Recent Labs    04/10/17 0608  WBC 9.1  RBC 4.79  HGB 15.9  HCT 45.4  PLT 225  MCV 94.8  MCH 33.2  MCHC 35.0  RDW 13.8   CMP  Recent Labs    04/09/17 2323 04/10/17 0230 04/10/17 0608  NA 135  --   --   K 4.3  --   --   CL 105  --   --   CO2 22  --   --   GLUCOSE 104*  --   --   BUN 9  --   --   CREATININE 1.06  --  1.06  CALCIUM 8.6*  --   --   PROT  --  6.5  --   ALBUMIN  --  3.6  --   AST  --  26  --   ALT  --  18  --   ALKPHOS  --  86  --   BILITOT  --  0.5  --   GFRNONAA >60  --  >60  GFRAA >60  --  >60   BNP (last 3 results) BNP    Component Value Date/Time   BNP 27.9 03/14/2016 0630   Cardiac Panel (last 3 results) Troponin (Point of Care Test) Recent Labs    04/09/17 2333  TROPIPOC 0.01   Cardiac Panel (last 3 results) Recent Labs    04/10/17 0741  TROPONINI <0.03     Radiology:  Previous bypass, no acute abnormality, CT scan of abdomen showed aortic atherosclerosis, no acute process in the abdomen or pelvis.  EKG: Sinus tachycardia, otherwise no acute disease Independently reviewed by me  Signed:  W. Ashley RoyaltySpencer Tilley, [name] MD [facility]   Cardiology Consultant  04/10/2017, 8:55 AM  Sinus tachycardia, otherwise no acute disease

## 2017-04-10 NOTE — ED Notes (Signed)
Report given to 1-west. [id number].

## 2017-04-10 NOTE — Progress Notes (Signed)
Patient seen and evaluated, chart reviewed, please see EMR for updated orders. Please see full H&P dictated by admitting physician Toniann FailKakrakandy  for same date of service.   1)Chest Pains-history of CAD with previous 5 vessel bypass in January 2018, ongoing tobacco use and noncompliance with medications and lifestyle, Dr Donnie Ahoilley (cardiologist) recommends [facility] evaluation and possible left heart catheterization to evaluate patency of grafts.  Continue aspirin, statin and metoprolol  2)Etoh Abuse-high risk for delirium tremens, benzos per CIWA protocol   Patient seen and evaluated, chart reviewed, please see EMR for updated orders. Please see full H&P dictated by admitting physician Dr Toniann FailKakrakandy for same date of service.    [facility] evaluation and possible left heart catheterization to evaluate patency of grafts.    ...  D/w Hospitalist Dr Milagros Evenerron Bhadari [facility] Team 2)  Shon Haleourage Othniel Maret, MD

## 2017-04-10 NOTE — ED Notes (Signed)
Spoke to Empoke,MD. He will be over shortly to evaluate the patient and get [facility].

## 2017-04-10 NOTE — H&P (Signed)
History and Physical    Douglas [name] ZOX:[id number] DOB: 08-17-1965 DOA: 04/10/2017  PCP: Patient, No Pcp Per  Patient coming from: Home.  Chief Complaint: Chest pain.  HPI: Douglas [name] is a 52 y.o. male with history of CAD status post CABG last January 2018, history of alcoholic pancreatitis presents to the ER with complaints of chest pain.  Patient states over the last 2 days patient has been having burning and sometimes stabbing type of retrosternal chest pain nonradiating with no associated shortness of breath cough or fever or chills.  Patient states he has been drinking alcohol and did throw up yesterday morning.  Pain became more worsen after throwing up.  Denies abdominal pain or diarrhea.  Of note patient has not been taking any medications and has not followed up with any primary care or cardiology.  ED Course: In the ER EKG shows sinus tachycardia.  LFTs cardiac markers and lipase were negative CT abdomen and pelvis was not showing anything acute.  Patient admitted for further observation given history of CAD.  Review of Systems: As per HPI, rest all negative.   Past Medical History:  Diagnosis Date  . Anginal pain (HCC)   . Coronary artery disease   . GERD (gastroesophageal reflux disease)   . History of acute pancreatitis 04/06/2015  . NSTEMI (non-ST elevated myocardial infarction) (HCC) 01/2014  . Pancreatitis   . PTSD (post-traumatic stress disorder)   . Tobacco abuse     Past Surgical History:  Procedure Laterality Date  . CARDIAC CATHETERIZATION N/A 03/14/2016   Procedure: Left Heart Cath and Coronary Angiography;  Surgeon: Kathleene Hazel, MD;  Location: [facility] INVASIVE CV LAB;  Service: Cardiovascular;  Laterality: N/A;  . CORONARY ANGIOPLASTY WITH STENT PLACEMENT  01/18/2014   "1"  . CORONARY ARTERY BYPASS GRAFT N/A 03/19/2016   Procedure: CORONARY ARTERY BYPASS GRAFTING (CABG), ON PUMP, TIMES FIVE, USING LEFT INTERNAL MAMMARY ARTERY AND RIGHT GREATER  SAPHENOUS VEIN HARVESTED ENDOSCOPICALLY;  Surgeon: Delight Ovens, MD;  Location: [facility] OR;  Service: Open Heart Surgery;  Laterality: N/A;  LIMA-LAD SVG-OM SVG-DIAG SEQ SVG-PD-PL  . LEFT HEART CATHETERIZATION WITH CORONARY ANGIOGRAM N/A 01/18/2014   Procedure: LEFT HEART CATHETERIZATION WITH CORONARY ANGIOGRAM;  Surgeon: Runell Gess, MD;  Location: [facility] CATH LAB;  Service: Cardiovascular;  Laterality: N/A;  . TEE WITHOUT CARDIOVERSION N/A 03/19/2016   Procedure: TRANSESOPHAGEAL ECHOCARDIOGRAM (TEE);  Surgeon: Delight Ovens, MD;  Location: [facility] OR;  Service: Open Heart Surgery;  Laterality: N/A;     reports that he has been smoking cigarettes.  He has a 31.00 pack-year smoking history. he has never used smokeless tobacco. He reports that he drinks about 0.6 oz of alcohol per week. He reports that he does not use drugs.  Allergies  Allergen Reactions  . Ibuprofen Nausea And Vomiting    UPSETS STOMACH  . Shellfish Allergy Nausea And Vomiting    Family History  Problem Relation Age of Onset  . Cancer Mother        Lung cancer  . Coronary artery disease Mother   . Heart failure Maternal Grandmother     Prior to Admission medications   Medication Sig Start Date End Date Taking? Authorizing Provider  rosuvastatin (CRESTOR) 10 MG tablet Take 1 tablet (10 mg total) by mouth daily. Patient not taking: Reported on 03/05/2015 01/19/14 03/25/15  Othella Boyer, MD    Physical Exam: Vitals:   04/09/17 2244 04/10/17 0112 04/10/17 0348  BP: (!) 147/117 Marland Kitchen)  148/96 (!) 143/90  Pulse: (!) 124 100 92  Resp: (!) 9 14 17   Temp: 98.3 F (36.8 C)    TempSrc: Oral    SpO2: 96% 99% 97%  Weight: 69.9 kg (154 lb)    Height: 5\' 11"  (1.803 m)        Constitutional: Moderately built and nourished. Vitals:   04/09/17 2244 04/10/17 0112 04/10/17 0348  BP: (!) 147/117 (!) 148/96 (!) 143/90  Pulse: (!) 124 100 92  Resp: (!) 9 14 17   Temp: 98.3 F (36.8 C)    TempSrc: Oral    SpO2: 96% 99%  97%  Weight: 69.9 kg (154 lb)    Height: 5\' 11"  (1.803 m)     Eyes: Anicteric no pallor. ENMT: No discharge from the ears eyes nose or mouth. Neck: No mass palpated no neck rigidity.  No JVD appreciated. Respiratory: No rhonchi or crepitations. Cardiovascular: S1-S2 heard tachycardic. Abdomen: Soft nontender bowel sounds present. Musculoskeletal: No edema.  No joint effusion. Skin: No rash.  Skin appears warm. Neurologic: Alert awake oriented to time place and person.  Moves all extremities. Psychiatric: Appears normal.  Normal affect.   Labs on Admission: I have personally reviewed following labs and imaging studies  CBC: Recent Labs  Lab 04/09/17 2323  WBC 9.9  HGB 18.1*  HCT 51.8  MCV 95.7  PLT 254   Basic Metabolic Panel: Recent Labs  Lab 04/09/17 2323  NA 135  K 4.3  CL 105  CO2 22  GLUCOSE 104*  BUN 9  CREATININE 1.06  CALCIUM 8.6*   GFR: Estimated Creatinine Clearance: 82.4 mL/min (by C-G formula based on SCr of 1.06 mg/dL). Liver Function Tests: Recent Labs  Lab 04/10/17 0230  AST 26  ALT 18  ALKPHOS 86  BILITOT 0.5  PROT 6.5  ALBUMIN 3.6   Recent Labs  Lab 04/10/17 0230  LIPASE 34   No results for input(s): AMMONIA in the last 168 hours. Coagulation Profile: No results for input(s): INR, PROTIME in the last 168 hours. Cardiac Enzymes: No results for input(s): CKTOTAL, CKMB, CKMBINDEX, TROPONINI in the last 168 hours. BNP (last 3 results) No results for input(s): PROBNP in the last 8760 hours. HbA1C: No results for input(s): HGBA1C in the last 72 hours. CBG: No results for input(s): GLUCAP in the last 168 hours. Lipid Profile: No results for input(s): CHOL, HDL, LDLCALC, TRIG, CHOLHDL, LDLDIRECT in the last 72 hours. Thyroid Function Tests: No results for input(s): TSH, T4TOTAL, FREET4, T3FREE, THYROIDAB in the last 72 hours. Anemia Panel: No results for input(s): VITAMINB12, FOLATE, FERRITIN, TIBC, IRON, RETICCTPCT in the last 72  hours. Urine analysis:    Component Value Date/Time   COLORURINE YELLOW 01/29/2016 0051   APPEARANCEUR CLEAR 01/29/2016 0051   LABSPEC 1.016 01/29/2016 0051   PHURINE 5.5 01/29/2016 0051   GLUCOSEU NEGATIVE 01/29/2016 0051   HGBUR NEGATIVE 01/29/2016 0051   BILIRUBINUR NEGATIVE 01/29/2016 0051   KETONESUR NEGATIVE 01/29/2016 0051   PROTEINUR NEGATIVE 01/29/2016 0051   UROBILINOGEN 1.0 01/15/2013 1135   NITRITE NEGATIVE 01/29/2016 0051   LEUKOCYTESUR NEGATIVE 01/29/2016 0051   Sepsis Labs: @LABRCNTIP (procalcitonin:4,lacticidven:4) )No results found for this or any previous visit (from the past 240 hour(s)).   Radiological Exams on Admission: Dg Chest 2 View  Result Date: 04/09/2017 CLINICAL DATA:  Mid chest pain and shortness of breath for 72 hours, smoker, previous heart surgery EXAM: CHEST  2 VIEW COMPARISON:  Chest x-ray dated 10/08/2016. chest x-ray dated 08/05/2015. FINDINGS: Heart  size and mediastinal contours are normal. Median sternotomy wires appear intact and stable in alignment. Stable small benign granuloma within the right upper lobe. Lungs otherwise clear. No pleural effusion or pneumothorax seen. No acute or suspicious osseous finding. IMPRESSION: No active cardiopulmonary disease. No evidence of pneumonia or pulmonary edema. Electronically Signed   By: Bary Richard M.D.   On: 04/09/2017 23:41   Ct Abdomen Pelvis W Contrast  Result Date: 04/10/2017 CLINICAL DATA:  Abdominal pain. EXAM: CT ABDOMEN AND PELVIS WITH CONTRAST TECHNIQUE: Multidetector CT imaging of the abdomen and pelvis was performed using the standard protocol following bolus administration of intravenous contrast. CONTRAST:  100 mL Isovue-300 COMPARISON:  04/10/2016 FINDINGS: Lower chest: Lung bases are clear. Postoperative changes in the mediastinum. Hepatobiliary: No focal liver abnormality is seen. No gallstones, gallbladder wall thickening, or biliary dilatation. Pancreas: Unremarkable. No pancreatic  ductal dilatation or surrounding inflammatory changes. Spleen: Normal in size without focal abnormality. Adrenals/Urinary Tract: Adrenal glands are unremarkable. Kidneys are normal, without renal calculi, focal lesion, or hydronephrosis. Bladder is unremarkable. Stomach/Bowel: Stomach is within normal limits. Appendix appears normal. No evidence of bowel wall thickening, distention, or inflammatory changes. Vascular/Lymphatic: Aortic atherosclerosis. No enlarged abdominal or pelvic lymph nodes. Reproductive: Prostate is unremarkable. Other: No free air or free fluid in the abdomen. Small periumbilical hernia containing fat. Musculoskeletal: No acute or significant osseous findings. IMPRESSION: 1. No acute process demonstrated in the abdomen or pelvis. No evidence of bowel obstruction or inflammation. Appendix is normal. 2. No renal or ureteral stone or obstruction. 3. Aortic atherosclerosis. Electronically Signed   By: Burman Nieves M.D.   On: 04/10/2017 03:22    EKG: Independently reviewed.  Sinus tachycardia.  Assessment/Plan Principal Problem:   Chest pain Active Problems:   Alcohol abuse   History of acute pancreatitis   CAD (coronary artery disease), native coronary artery   S/P CABG x 5    1. Chest pain with history of CAD status post CABG -appears atypical.  We will cycle cardiac markers.  Patient is on PRN sublingual nitroglycerin and patient also placed on aspirin.  Have requested cardiology consult and then patient will be n.p.o. except medications. 2. History of alcoholic pancreatitis -had one episode of nausea vomiting yesterday.  CT of the abdomen and pelvis unremarkable.  LFTs and lipase were negative.  Closely observe. 3. Alcohol abuse and tobacco abuse -advised to quit.  Patient is on CIWA protocol. 4. Elevated blood pressure -closely follow blood pressure trends.   DVT prophylaxis: Lovenox. Code Status: Full code. Family Communication: Discussed with patient. Disposition  Plan: Home. Consults called: Cardiology. Admission status: Observation.   Eduard Clos MD [facility] Pager [phone].  If 7PM-7AM, please contact night-coverage [email or web address] Password [email or web address]  04/10/2017, 5:11 AM

## 2017-04-10 NOTE — ED Notes (Signed)
ED TO INPATIENT HANDOFF REPORT  Name/Age/Gender Leanord Asal 52 y.o. male  Code Status    Code Status Orders  (From admission, onward)        Start     Ordered   04/10/17 0510  Full code  Continuous     04/10/17 0510    Code Status History    Date Active Date Inactive Code Status Order ID Comments User Context   10/08/2016 22:05 10/09/2016 20:04 Full Code [id number]  Rise Patience, MD ED   03/14/2016 06:12 03/27/2016 19:27 Full Code [id number]  Sueanne Margarita, MD ED   04/16/2015 10:47 04/19/2015 15:57 Full Code [id number]  Debbe Odea, MD ED   04/06/2015 11:57 04/10/2015 16:17 Full Code [id number]  Rama, Venetia Maxon, MD ED   03/05/2015 14:43 03/10/2015 17:35 Full Code [id number]  Velvet Bathe, MD Inpatient   01/18/2014 17:14 01/19/2014 17:29 Full Code [id number]  Lorretta Harp, MD Inpatient   01/18/2014 05:59 01/18/2014 17:14 Full Code [id number]  Jacolyn Reedy, MD ED      Home/SNF/Other Home  Chief Complaint Chest pain, Pancreatitis??  Level of Care/Admitting Diagnosis ED Disposition    ED Disposition Condition Comment   Admit  Hospital Area: [place] [[id number]]  Level of Care: Telemetry [5]  Admit to tele based on following criteria: Monitor for Ischemic changes  Diagnosis: Chest pain [088110]  Admitting Physician: Rise Patience [[id number]]  Attending Physician: Rise Patience [phone]  PT Class (Do Not Modify): Observation [104]  PT Acc Code (Do Not Modify): Observation [10022]       Medical History Past Medical History:  Diagnosis Date  . Anginal pain ([place])   . Coronary artery disease   . GERD (gastroesophageal reflux disease)   . History of acute pancreatitis 04/06/2015  . NSTEMI (non-ST elevated myocardial infarction) ([place]) 01/2014  . Pancreatitis   . PTSD (post-traumatic stress disorder)   . Tobacco abuse     Allergies Allergies  Allergen Reactions  . Ibuprofen Nausea And Vomiting    UPSETS STOMACH  . Shellfish  Allergy Nausea And Vomiting    IV Location/Drains/Wounds Patient Lines/Drains/Airways Status   Active Line/Drains/Airways    Name:   Placement date:   Placement time:   Site:   Days:   Peripheral IV 04/10/17 Right Forearm   04/10/17    0235    Forearm   less than 1   Incision (Closed) 03/19/16 Chest Other (Comment)   03/19/16    1245     387   Incision (Closed) 03/19/16 Leg Right   03/19/16    1245     387   Wound / Incision (Open or Dehisced) 03/14/16 Other (Comment) Finger (Comment which one) Right   03/14/16    1928    Finger (Comment which one)   392          Labs/Imaging Results for orders placed or performed during the hospital encounter of 04/10/17 (from the past 48 hour(s))  Basic metabolic panel     Status: Abnormal   Collection Time: 04/09/17 11:23 PM  Result Value Ref Range   Sodium 135 135 - 145 mmol/L   Potassium 4.3 3.5 - 5.1 mmol/L   Chloride 105 101 - 111 mmol/L   CO2 22 22 - 32 mmol/L   Glucose, Bld 104 (H) 65 - 99 mg/dL   BUN 9 6 - 20 mg/dL   Creatinine, Ser 1.06 0.61 - 1.24 mg/dL   Calcium 8.6 (L) 8.9 -  10.3 mg/dL   GFR calc non Af Amer >60 >60 mL/min   GFR calc Af Amer >60 >60 mL/min    Comment: (NOTE) The eGFR has been calculated using the CKD EPI equation. This calculation has not been validated in all clinical situations. eGFR's persistently <60 mL/min signify possible Chronic Kidney Disease.    Anion gap 8 5 - 15  CBC     Status: Abnormal   Collection Time: 04/09/17 11:23 PM  Result Value Ref Range   WBC 9.9 4.0 - 10.5 K/uL   RBC 5.41 4.22 - 5.81 MIL/uL   Hemoglobin 18.1 (H) 13.0 - 17.0 g/dL   HCT 51.8 39.0 - 52.0 %   MCV 95.7 78.0 - 100.0 fL   MCH 33.5 26.0 - 34.0 pg   MCHC 34.9 30.0 - 36.0 g/dL   RDW 13.6 11.5 - 15.5 %   Platelets 254 150 - 400 K/uL  I-stat troponin, ED     Status: None   Collection Time: 04/09/17 11:33 PM  Result Value Ref Range   Troponin i, poc 0.01 0.00 - 0.08 ng/mL   Comment 3            Comment: Due to the release  kinetics of cTnI, a negative result within the first hours of the onset of symptoms does not rule out myocardial infarction with certainty. If myocardial infarction is still suspected, repeat the test at appropriate intervals.   Hepatic function panel     Status: Abnormal   Collection Time: 04/10/17  2:30 AM  Result Value Ref Range   Total Protein 6.5 6.5 - 8.1 g/dL   Albumin 3.6 3.5 - 5.0 g/dL   AST 26 15 - 41 U/L   ALT 18 17 - 63 U/L   Alkaline Phosphatase 86 38 - 126 U/L   Total Bilirubin 0.5 0.3 - 1.2 mg/dL   Bilirubin, Direct <0.1 (L) 0.1 - 0.5 mg/dL   Indirect Bilirubin NOT CALCULATED 0.3 - 0.9 mg/dL  Lipase, blood     Status: None   Collection Time: 04/10/17  2:30 AM  Result Value Ref Range   Lipase 34 11 - 51 U/L  Ethanol     Status: None   Collection Time: 04/10/17  2:30 AM  Result Value Ref Range   Alcohol, Ethyl (B) <10 <10 mg/dL    Comment:        LOWEST DETECTABLE LIMIT FOR SERUM ALCOHOL IS 10 mg/dL FOR MEDICAL PURPOSES ONLY   D-dimer, quantitative (not at [facility])     Status: None   Collection Time: 04/10/17  2:30 AM  Result Value Ref Range   D-Dimer, Quant 0.27 0.00 - 0.50 ug/mL-FEU    Comment: (NOTE) At the manufacturer cut-off of 0.50 ug/mL FEU, this assay has been documented to exclude PE with a sensitivity and negative predictive value of 97 to 99%.  At this time, this assay has not been approved by the FDA to exclude DVT/VTE. Results should be correlated with clinical presentation.   CBC     Status: None   Collection Time: 04/10/17  6:08 AM  Result Value Ref Range   WBC 9.1 4.0 - 10.5 K/uL   RBC 4.79 4.22 - 5.81 MIL/uL   Hemoglobin 15.9 13.0 - 17.0 g/dL   HCT 45.4 39.0 - 52.0 %   MCV 94.8 78.0 - 100.0 fL   MCH 33.2 26.0 - 34.0 pg   MCHC 35.0 30.0 - 36.0 g/dL   RDW 13.8 11.5 - 15.5 %  Platelets 225 150 - 400 K/uL  Creatinine, serum     Status: None   Collection Time: 04/10/17  6:08 AM  Result Value Ref Range   Creatinine, Ser 1.06 0.61 - 1.24  mg/dL   GFR calc non Af Amer >60 >60 mL/min   GFR calc Af Amer >60 >60 mL/min    Comment: (NOTE) The eGFR has been calculated using the CKD EPI equation. This calculation has not been validated in all clinical situations. eGFR's persistently <60 mL/min signify possible Chronic Kidney Disease.   Troponin I     Status: None   Collection Time: 04/10/17  7:41 AM  Result Value Ref Range   Troponin I <0.03 <0.03 ng/mL   Dg Chest 2 View  Result Date: 04/09/2017 CLINICAL DATA:  Mid chest pain and shortness of breath for 72 hours, smoker, previous heart surgery EXAM: CHEST  2 VIEW COMPARISON:  Chest x-ray dated 10/08/2016. chest x-ray dated 08/05/2015. FINDINGS: Heart size and mediastinal contours are normal. Median sternotomy wires appear intact and stable in alignment. Stable small benign granuloma within the right upper lobe. Lungs otherwise clear. No pleural effusion or pneumothorax seen. No acute or suspicious osseous finding. IMPRESSION: No active cardiopulmonary disease. No evidence of pneumonia or pulmonary edema. Electronically Signed   By: Franki Cabot M.D.   On: 04/09/2017 23:41   Ct Abdomen Pelvis W Contrast  Result Date: 04/10/2017 CLINICAL DATA:  Abdominal pain. EXAM: CT ABDOMEN AND PELVIS WITH CONTRAST TECHNIQUE: Multidetector CT imaging of the abdomen and pelvis was performed using the standard protocol following bolus administration of intravenous contrast. CONTRAST:  100 mL Isovue-300 COMPARISON:  04/10/2016 FINDINGS: Lower chest: Lung bases are clear. Postoperative changes in the mediastinum. Hepatobiliary: No focal liver abnormality is seen. No gallstones, gallbladder wall thickening, or biliary dilatation. Pancreas: Unremarkable. No pancreatic ductal dilatation or surrounding inflammatory changes. Spleen: Normal in size without focal abnormality. Adrenals/Urinary Tract: Adrenal glands are unremarkable. Kidneys are normal, without renal calculi, focal lesion, or hydronephrosis.  Bladder is unremarkable. Stomach/Bowel: Stomach is within normal limits. Appendix appears normal. No evidence of bowel wall thickening, distention, or inflammatory changes. Vascular/Lymphatic: Aortic atherosclerosis. No enlarged abdominal or pelvic lymph nodes. Reproductive: Prostate is unremarkable. Other: No free air or free fluid in the abdomen. Small periumbilical hernia containing fat. Musculoskeletal: No acute or significant osseous findings. IMPRESSION: 1. No acute process demonstrated in the abdomen or pelvis. No evidence of bowel obstruction or inflammation. Appendix is normal. 2. No renal or ureteral stone or obstruction. 3. Aortic atherosclerosis. Electronically Signed   By: Lucienne Capers M.D.   On: 04/10/2017 03:22    Pending Labs Unresulted Labs (From admission, onward)   Start     Ordered   04/17/17 0500  Creatinine, serum  (enoxaparin (LOVENOX)    CrCl >/= 30 ml/min)  Weekly,   R    Comments:  while on enoxaparin therapy    04/10/17 0510   04/10/17 0709  Troponin I  Now then every 6 hours,   R     04/10/17 0708      Vitals/Pain Today's Vitals   04/10/17 0727 04/10/17 0923 04/10/17 0939 04/10/17 1001  BP:  131/84  131/84  Pulse:  79  90  Resp:  13    Temp:      TempSrc:      SpO2:  96%    Weight:      Height:      PainSc: 6   8  Isolation Precautions No active isolations  Medications Medications  nitroGLYCERIN (NITROSTAT) SL tablet 0.4 mg (0.4 mg Sublingual Given 04/10/17 0436)  acetaminophen (TYLENOL) tablet 650 mg (not administered)  ondansetron (ZOFRAN) injection 4 mg (not administered)  enoxaparin (LOVENOX) injection 40 mg (40 mg Subcutaneous Given 04/10/17 1002)  aspirin EC tablet 325 mg (325 mg Oral Given 04/10/17 1001)  LORazepam (ATIVAN) tablet 1 mg (not administered)    Or  LORazepam (ATIVAN) injection 1 mg (not administered)  thiamine (VITAMIN B-1) tablet 100 mg (100 mg Oral Given 04/10/17 1001)    Or  thiamine (B-1) injection 100 mg (  Intravenous See Alternative 11/09/71 5430)  folic acid (FOLVITE) tablet 1 mg (1 mg Oral Given 04/10/17 1001)  multivitamin with minerals tablet 1 tablet (1 tablet Oral Given 04/10/17 1001)  metoprolol tartrate (LOPRESSOR) tablet 25 mg (25 mg Oral Given 04/10/17 1001)  rosuvastatin (CRESTOR) tablet 10 mg (not administered)  morphine 2 MG/ML injection 2 mg (not administered)  diazepam (VALIUM) tablet 2 mg (not administered)  sodium chloride 0.9 % bolus 1,000 mL (0 mLs Intravenous Stopped 04/10/17 0340)  morphine 4 MG/ML injection 4 mg (4 mg Intravenous Given 04/10/17 0237)  ondansetron (ZOFRAN) injection 4 mg (4 mg Intravenous Given 04/10/17 0237)  iopamidol (ISOVUE-300) 61 % injection 100 mL (100 mLs Intravenous Contrast Given 04/10/17 0305)  aspirin chewable tablet 324 mg (324 mg Oral Given 04/10/17 0437)  morphine 2 MG/ML injection 1 mg (1 mg Intravenous Given 04/10/17 0637)    Mobility walks

## 2017-04-11 ENCOUNTER — Encounter (HOSPITAL_COMMUNITY)
Admission: EM | Disposition: A | Discharge status: Home / Self Care | Payer: Self-pay | Source: Home / Self Care | Attending: Family Medicine

## 2017-04-11 DIAGNOSIS — I25110 Atherosclerotic heart disease of native coronary artery with unstable angina pectoris: Principal | ICD-10-CM

## 2017-04-11 DIAGNOSIS — I25118 Atherosclerotic heart disease of native coronary artery with other forms of angina pectoris: Secondary | ICD-10-CM

## 2017-04-11 HISTORY — PX: INTRAVASCULAR PRESSURE WIRE/FFR STUDY: CATH118243

## 2017-04-11 HISTORY — PX: CORONARY BALLOON ANGIOPLASTY: CATH118233

## 2017-04-11 HISTORY — PX: LEFT HEART CATH AND CORS/GRAFTS ANGIOGRAPHY: CATH118250

## 2017-04-11 LAB — BASIC METABOLIC PANEL
ANION GAP: 7 (ref 5–15)
BUN: 8 mg/dL (ref 6–20)
CALCIUM: 8.3 mg/dL — AB (ref 8.9–10.3)
CHLORIDE: 104 mmol/L (ref 101–111)
CO2: 24 mmol/L (ref 22–32)
Creatinine, Ser: 0.97 mg/dL (ref 0.61–1.24)
GFR calc non Af Amer: >60 mL/min (ref >60)
Glucose, Bld: 102 mg/dL — ABNORMAL HIGH (ref 65–99)
POTASSIUM: 4.1 mmol/L (ref 3.5–5.1)
Sodium: 135 mmol/L (ref 135–145)

## 2017-04-11 LAB — POCT ACTIVATED CLOTTING TIME
ACTIVATED CLOTTING TIME: 219 s
ACTIVATED CLOTTING TIME: 213 s
Activated Clotting Time: 268 seconds
Activated Clotting Time: 164 seconds

## 2017-04-11 LAB — PROTIME-INR
INR: 0.88
Prothrombin Time: 11.9 seconds (ref 11.4–15.2)

## 2017-04-11 SURGERY — LEFT HEART CATH AND CORS/GRAFTS ANGIOGRAPHY
Anesthesia: LOCAL

## 2017-04-11 MED ORDER — SODIUM CHLORIDE 0.9 % WEIGHT BASED INFUSION: 1.0000 mL/kg/h | INTRAVENOUS | Status: DC

## 2017-04-11 MED ORDER — ASPIRIN 81 MG PO CHEW
81.0000 mg | CHEWABLE_TABLET | Freq: Every day | ORAL | Status: DC
  Administered 2017-04-12: 09:00:00 81 mg via ORAL
  Filled 2017-04-11: qty 1

## 2017-04-11 MED ORDER — FENTANYL CITRATE (PF) 100 MCG/2ML IJ SOLN
INTRAMUSCULAR | Status: DC | PRN
  Administered 2017-04-11 (×2): 25 ug via INTRAVENOUS
  Administered 2017-04-11: 50 ug via INTRAVENOUS

## 2017-04-11 MED ORDER — CLOPIDOGREL BISULFATE 300 MG PO TABS
ORAL_TABLET | ORAL | Status: AC
  Filled 2017-04-11: qty 1

## 2017-04-11 MED ORDER — SODIUM CHLORIDE 0.9 % IV SOLN: 250.0000 mL | INTRAVENOUS | Status: DC | PRN

## 2017-04-11 MED ORDER — SODIUM CHLORIDE 0.9% FLUSH: 3.0000 mL | INTRAVENOUS | Status: DC | PRN

## 2017-04-11 MED ORDER — MORPHINE SULFATE (PF) 4 MG/ML IV SOLN
2.0000 mg | INTRAVENOUS | Status: DC | PRN
  Administered 2017-04-11 – 2017-04-12 (×8): 2 mg via INTRAVENOUS
  Filled 2017-04-11 (×8): qty 1

## 2017-04-11 MED ORDER — HEPARIN (PORCINE) IN NACL 2-0.9 UNIT/ML-% IJ SOLN
INTRAMUSCULAR | Status: AC
  Filled 2017-04-11: qty 1000

## 2017-04-11 MED ORDER — MIDAZOLAM HCL 2 MG/2ML IJ SOLN
INTRAMUSCULAR | Status: AC
  Filled 2017-04-11: qty 2

## 2017-04-11 MED ORDER — CLOPIDOGREL BISULFATE 300 MG PO TABS
ORAL_TABLET | ORAL | Status: DC | PRN
  Administered 2017-04-11: 600 mg via ORAL

## 2017-04-11 MED ORDER — ADENOSINE 12 MG/4ML IV SOLN
INTRAVENOUS | Status: AC
  Filled 2017-04-11: qty 4

## 2017-04-11 MED ORDER — MIDAZOLAM HCL 2 MG/2ML IJ SOLN
INTRAMUSCULAR | Status: DC | PRN
  Administered 2017-04-11: 0.5 mg via INTRAVENOUS
  Administered 2017-04-11 (×2): 1 mg via INTRAVENOUS

## 2017-04-11 MED ORDER — ASPIRIN 81 MG PO CHEW: 81.0000 mg | CHEWABLE_TABLET | ORAL | Status: DC

## 2017-04-11 MED ORDER — CLOPIDOGREL BISULFATE 75 MG PO TABS
75.0000 mg | ORAL_TABLET | Freq: Every day | ORAL | Status: DC
  Administered 2017-04-12: 75 mg via ORAL
  Filled 2017-04-11: qty 1

## 2017-04-11 MED ORDER — LABETALOL HCL 5 MG/ML IV SOLN: 10.0000 mg | INTRAVENOUS | Status: AC | PRN

## 2017-04-11 MED ORDER — HEPARIN (PORCINE) IN NACL 2-0.9 UNIT/ML-% IJ SOLN
INTRAMUSCULAR | Status: AC | PRN
  Administered 2017-04-11: 1000 mL

## 2017-04-11 MED ORDER — HEPARIN SODIUM (PORCINE) 1000 UNIT/ML IJ SOLN
INTRAMUSCULAR | Status: DC | PRN
  Administered 2017-04-11: 2000 [IU] via INTRAVENOUS
  Administered 2017-04-11: 3000 [IU] via INTRAVENOUS
  Administered 2017-04-11: 6000 [IU] via INTRAVENOUS

## 2017-04-11 MED ORDER — SODIUM CHLORIDE 0.9% FLUSH
3.0000 mL | Freq: Two times a day (BID) | INTRAVENOUS | Status: DC
  Administered 2017-04-11: 3 mL via INTRAVENOUS

## 2017-04-11 MED ORDER — IOPAMIDOL (ISOVUE-370) INJECTION 76%
INTRAVENOUS | Status: DC | PRN
  Administered 2017-04-11: 120 mL via INTRA_ARTERIAL

## 2017-04-11 MED ORDER — ANGIOPLASTY BOOK
Freq: Once | Status: AC
  Administered 2017-04-11: 1
  Filled 2017-04-11: qty 1

## 2017-04-11 MED ORDER — FENTANYL CITRATE (PF) 100 MCG/2ML IJ SOLN
INTRAMUSCULAR | Status: AC
  Filled 2017-04-11: qty 2

## 2017-04-11 MED ORDER — NITROGLYCERIN 1 MG/10 ML FOR IR/CATH LAB
INTRA_ARTERIAL | Status: DC | PRN
  Administered 2017-04-11 (×2): 200 ug via INTRACORONARY

## 2017-04-11 MED ORDER — HEPARIN SODIUM (PORCINE) 1000 UNIT/ML IJ SOLN
INTRAMUSCULAR | Status: AC
  Filled 2017-04-11: qty 1

## 2017-04-11 MED ORDER — SODIUM CHLORIDE 0.9 % IV SOLN: INTRAVENOUS | Status: AC

## 2017-04-11 MED ORDER — SODIUM CHLORIDE 0.9% FLUSH: 3.0000 mL | Freq: Two times a day (BID) | INTRAVENOUS | Status: DC

## 2017-04-11 MED ORDER — IOPAMIDOL (ISOVUE-370) INJECTION 76%
INTRAVENOUS | Status: AC
  Filled 2017-04-11: qty 125

## 2017-04-11 MED ORDER — HEPARIN SODIUM (PORCINE) 5000 UNIT/ML IJ SOLN
5000.0000 [IU] | Freq: Three times a day (TID) | INTRAMUSCULAR | Status: DC
  Administered 2017-04-12: 5000 [IU] via SUBCUTANEOUS
  Filled 2017-04-11: qty 1

## 2017-04-11 MED ORDER — HYDRALAZINE HCL 20 MG/ML IJ SOLN: 5.0000 mg | INTRAMUSCULAR | Status: AC | PRN

## 2017-04-11 MED ORDER — LIDOCAINE HCL (PF) 1 % IJ SOLN
INTRAMUSCULAR | Status: DC | PRN
  Administered 2017-04-11: 15 mL

## 2017-04-11 MED ORDER — LIDOCAINE HCL (PF) 1 % IJ SOLN
INTRAMUSCULAR | Status: AC
  Filled 2017-04-11: qty 30

## 2017-04-11 MED ORDER — SODIUM CHLORIDE 0.9 % WEIGHT BASED INFUSION
3.0000 mL/kg/h | INTRAVENOUS | Status: DC
  Administered 2017-04-11: 3 mL/kg/h via INTRAVENOUS

## 2017-04-11 MED ORDER — ADENOSINE (DIAGNOSTIC) 140MCG/KG/MIN
INTRAVENOUS | Status: DC | PRN
  Administered 2017-04-11: 140 ug/kg/min via INTRAVENOUS

## 2017-04-11 SURGICAL SUPPLY — 24 items
BALLN ~~LOC~~ EMERGE MR 3.5X12 (BALLOONS) ×2
BALLOON ~~LOC~~ EMERGE MR 3.5X12 (BALLOONS) IMPLANT
CATH INFINITI 5 FR MPA2 (CATHETERS) ×1 IMPLANT
CATH INFINITI 5FR MULTPACK ANG (CATHETERS) ×1 IMPLANT
CATH LAUNCHER 5F JR4 (CATHETERS) ×1 IMPLANT
CATH MICROCATH NAVVUS (MICROCATHETER) IMPLANT
COVER PRB 48X5XTLSCP FOLD TPE (BAG) IMPLANT
COVER PROBE 5X48 (BAG) ×2
GLIDESHEATH SLEND SS 6F .021 (SHEATH) IMPLANT
GUIDEWIRE INQWIRE 1.5J.035X260 (WIRE) IMPLANT
GUIDEWIRE PRESSURE COMET II (WIRE) ×1 IMPLANT
INQWIRE 1.5J .035X260CM (WIRE)
KIT ENCORE 26 ADVANTAGE (KITS) ×1 IMPLANT
KIT ESSENTIALS PG (KITS) ×1 IMPLANT
KIT HEART LEFT (KITS) ×2 IMPLANT
KIT MICROINTRODUCER STIFF 5F (SHEATH) ×1 IMPLANT
MICROCATHETER NAVVUS (MICROCATHETER) ×2
PACK CARDIAC CATHETERIZATION (CUSTOM PROCEDURE TRAY) ×2 IMPLANT
SHEATH PINNACLE 5F 10CM (SHEATH) ×1 IMPLANT
SYR MEDRAD MARK V 150ML (SYRINGE) ×2 IMPLANT
TRANSDUCER W/STOPCOCK (MISCELLANEOUS) ×2 IMPLANT
TUBING CIL FLEX 10 FLL-RA (TUBING) ×2 IMPLANT
WIRE EMERALD 3MM-J .035X150CM (WIRE) ×1 IMPLANT
WIRE RUNTHROUGH .014X180CM (WIRE) ×1 IMPLANT

## 2017-04-11 NOTE — Progress Notes (Addendum)
Subjective:  Blood pressure is better controlled.  Calmer today but continues to have chest pain.  Complains of numbness of the arm and states chest pain is similar to what he had previously.he also complains of the pain being sharp and associated with numbness of the left side of his chest occasionally pleuritic.  Objective:  Vital Signs in the last 24 hours: BP 118/76   Pulse 71   Temp 98.1 F (36.7 C) (Oral)   Resp 19   Ht 5' 11" (1.803 m)   Wt 70.6 kg (155 lb 10.3 oz)   SpO2 95%   BMI 21.71 kg/m   Physical Exam: Thin white male in no acute distress Lungs:  Clear Cardiac:  Regular rhythm, normal S1 and S2, no S3 Extremities:  No edema present, the left radial pulse is not greatly prominent.  Intake/Output from previous day: No intake/output data recorded.  Weight Filed Weights   04/09/17 2244 04/11/17 0226  Weight: 69.9 kg (154 lb) 70.6 kg (155 lb 10.3 oz)    Lab Results: Basic Metabolic Panel: Recent Labs    04/09/17 2323 04/10/17 0608  NA 135  --   K 4.3  --   CL 105  --   CO2 22  --   GLUCOSE 104*  --   BUN 9  --   CREATININE 1.06 1.06   CBC: Recent Labs    04/09/17 2323 04/10/17 0608  WBC 9.9 9.1  HGB 18.1* 15.9  HCT 51.8 45.4  MCV 95.7 94.8  PLT 254 225   Cardiac Enzymes: Troponin (Point of Care Test) Recent Labs    04/09/17 2333  TROPIPOC 0.01   Cardiac Panel (last 3 results) Recent Labs    04/10/17 0741 04/10/17 1242 04/10/17 1858  TROPONINI <0.03 <0.03 <0.03    Telemetry: personally reviewed.  Sinus rhythm today heart rate slower  Assessment/Plan:  1.  Chest pain with some typical other atypical features with negative enzymes.  Continues to have ongoing symptoms. 2.  CAD with previous bypass grafting and previous stenting 3.  Noncompliance 4.  Hyperlipidemia  Recommendations:  He is still very anxious about etiology of chest pain.  Has had significant issues with noncompliance and has actually never followed up in my  office following his previous stenting or bypass grafting.  Admission this summer with recurrent chest pain and admission now.  I have recommended cardiac catheterization to assess current status of his graft patency.  Cardiac catheterization was discussed with the patient fully including risks of myocardial infarction, death, stroke, bleeding, arrhythmia, dye allergy, renal insufficiency or bleeding.  The patient understands and is willing to proceed.  Possibility of intervention at the same time also discussed with patient and he understands and is agreeable to proceed. May or may not be able to use the left radial for access.  W. Spencer Tilley, [name]  MD FACC Cardiology  04/11/2017, 8:59 AM   

## 2017-04-11 NOTE — Care Management Note (Addendum)
Case Management Note  Patient Details  Name: Douglas [name] MRN: [id number] Date of Birth: 1965-09-29  Subjective/Objective:  From home with friends, pta indep, presents with chest pain, for heart cath today.  He has no PCP ,no insurance, NCM scheduled a follow up apt with [facility] on 2/12 at 10 am. NCM gave the patient the brochure.    2/1 1021 Letha Capeeborah Anielle Headrick RN, BSN- NCM notified MD to send scripts to [facility] and patient is to go there as soon as he is discharged to get his medications.                   Action/Plan: NCM will follow for dc needs.   Expected Discharge Date:  (unknown)               Expected Discharge Plan:  Home/Self Care  In-House Referral:     Discharge planning Services  CM Consult, Medication Assistance, Follow-up appt scheduled, [facility]  Post Acute Care Choice:    Choice offered to:     DME Arranged:    DME Agency:     HH Arranged:    HH Agency:     Status of Service:  In process, will continue to follow  If discussed at Long Length of Stay Meetings, dates discussed:    Additional Comments:  Leone Havenaylor, Makana Rostad Clinton, RN 04/11/2017, 9:38 AM

## 2017-04-11 NOTE — Brief Op Note (Signed)
BRIEF CARDIAC CATHETERIZATION NOTE  DATE: 04/11/2017 TIME: 5:15 PM  PATIENT:  Minerva EndsJonathan Dabbs  52 y.o. male  PRE-OPERATIVE DIAGNOSIS:  Unstable angina  POST-OPERATIVE DIAGNOSIS:  Unstable angina  PROCEDURE:  Procedure(s) with comments: LEFT HEART CATH AND CORS/GRAFTS ANGIOGRAPHY (N/A) INTRAVASCULAR PRESSURE WIRE/FFR STUDY (N/A) CORONARY BALLOON ANGIOPLASTY (N/A) - Distal RCA  SURGEON:  Surgeon(s) and Role:    Yvonne Kendall* Mariyam Remington, MD - Primary  FINDINGS: 1. Severe native coronary artery disease, including 90% proximal LAD, 80-90% ostial LCx with diffuse LCx/OM disease, and 70% mid RCA ISR (FFR 0.73). 2. Patent LIMA-LAD with 30% ostial stenosis (question spasm). 3. All SVG's chronically occluded. 4. FFR-guided balloon angioplasty to mid RCA stent with <10% residual stenosis. 5. 80-90% right common femoral artery stenosis with large, eccentric plaque.  RECOMMENDATIONS: 1. DAPT with ASA and clopidogrel for at least 1 month, ideally 12 months or longer. 2. Aggressive secondary prevention and medical therapy for LCx disease.  Yvonne Kendallhristopher Ivett Luebbe, MD [facility] Pager: [phone]) [phone]

## 2017-04-11 NOTE — Progress Notes (Signed)
[facility] [place], [facility]ity fundraiserN [facility]. Left number in case she had additional questions. Patient will be going to [id number]. Called [facility] for transport.

## 2017-04-11 NOTE — H&P (View-Only) (Signed)
Subjective:  Blood pressure is better controlled.  Calmer today but continues to have chest pain.  Complains of numbness of the arm and states chest pain is similar to what he had previously.he also complains of the pain being sharp and associated with numbness of the left side of his chest occasionally pleuritic.  Objective:  Vital Signs in the last 24 hours: BP 118/76   Pulse 71   Temp 98.1 F (36.7 C) (Oral)   Resp 19   Ht 5\' 11"  (1.803 m)   Wt 70.6 kg (155 lb 10.3 oz)   SpO2 95%   BMI 21.71 kg/m   Physical Exam: Thin white male in no acute distress Lungs:  Clear Cardiac:  Regular rhythm, normal S1 and S2, no S3 Extremities:  No edema present, the left radial pulse is not greatly prominent.  Intake/Output from previous day: No intake/output data recorded.  Weight Filed Weights   04/09/17 2244 04/11/17 0226  Weight: 69.9 kg (154 lb) 70.6 kg (155 lb 10.3 oz)    Lab Results: Basic Metabolic Panel: Recent Labs    04/09/17 2323 04/10/17 0608  NA 135  --   K 4.3  --   CL 105  --   CO2 22  --   GLUCOSE 104*  --   BUN 9  --   CREATININE 1.06 1.06   CBC: Recent Labs    04/09/17 2323 04/10/17 0608  WBC 9.9 9.1  HGB 18.1* 15.9  HCT 51.8 45.4  MCV 95.7 94.8  PLT 254 225   Cardiac Enzymes: Troponin (Point of Care Test) Recent Labs    04/09/17 2333  TROPIPOC 0.01   Cardiac Panel (last 3 results) Recent Labs    04/10/17 0741 04/10/17 1242 04/10/17 1858  TROPONINI <0.03 <0.03 <0.03    Telemetry: personally reviewed.  Sinus rhythm today heart rate slower  Assessment/Plan:  1.  Chest pain with some typical other atypical features with negative enzymes.  Continues to have ongoing symptoms. 2.  CAD with previous bypass grafting and previous stenting 3.  Noncompliance 4.  Hyperlipidemia  Recommendations:  He is still very anxious about etiology of chest pain.  Has had significant issues with noncompliance and has actually never followed up in my  office following his previous stenting or bypass grafting.  Admission this summer with recurrent chest pain and admission now.  I have recommended cardiac catheterization to assess current status of his graft patency.  Cardiac catheterization was discussed with the patient fully including risks of myocardial infarction, death, stroke, bleeding, arrhythmia, dye allergy, renal insufficiency or bleeding.  The patient understands and is willing to proceed.  Possibility of intervention at the same time also discussed with patient and he understands and is agreeable to proceed. May or may not be able to use the left radial for access.  [name]. Spencer Tilley, [name]  MD [facility]  04/11/2017, 8:59 AM

## 2017-04-11 NOTE — Progress Notes (Signed)
PROGRESS NOTE    Douglas EndsJonathan [name]  ZOX:[id number] DOB: [birth year] DOA: 04/10/2017 PCP: Patient, No Pcp Per  Outpatient Specialists:   Brief Narrative: Patient is a 52 year old Caucasian male with past medical history significant for coronary artery disease status post CABG in 2018, alcohol abuse that is continuous, and alcoholic pancreatitis.  Patient was admitted with chest pain with typical and atypical features.  Troponin has been negative.  Patient continues to report vague left upper chest pain.  Cardiology input is highly appreciated.  Cardiac catheterization is planned for today.  No headache, no neck pain, no shortness of breath.  No withdrawal signs from alcohol.  According to the patient, his last drink was just before admission.  Continue to monitor closely for alcohol withdrawal syndrome.  Assessment & Plan:   Principal Problem:   Chest pain Active Problems:   Alcohol abuse   History of acute pancreatitis   CAD (coronary artery disease), native coronary artery   Aortic atherosclerosis (HCC)   Patient's noncompliance with other medical treatment and regimen   Chest pain. Negative troponin. EKG has not shown any new change. Cardiology input is appreciated. For cardiac catheterization today.  Alcohol abuse, continuous. Patient has been counseled to quit alcohol. Continue thiamine. As needed Ativan. Continue to monitor for possible withdrawal symptoms.  None compliance. Patient has been counseled.    DVT prophylaxis: On Heparin Code Status: Full Family Communication: Family friend Disposition Plan: Likely Home   Consultants:   Cardiology.  Procedures:   For cardiac catheterization today.  Antimicrobials:   None.   Subjective: Vague left upper chest pain  Objective: Vitals:   04/11/17 0426 04/11/17 0612 04/11/17 0809 04/11/17 1520  BP: 122/72 120/76 118/76   Pulse: 75 88 71   Resp: 17  19   Temp: 97.9 F (36.6 C)  98.1 F (36.7 C)     TempSrc: Oral  Oral   SpO2: 99%  95% 99%  Weight:      Height:        Intake/Output Summary (Last 24 hours) at 04/11/2017 1704 Last data filed at 04/11/2017 0811 Gross per 24 hour  Intake 0 ml  Output -  Net 0 ml   Filed Weights   04/09/17 2244 04/11/17 0226  Weight: 69.9 kg (154 lb) 70.6 kg (155 lb 10.3 oz)    Examination:  General exam: Appears calm and comfortable  Respiratory system: Clear to auscultation. Respiratory effort normal. Cardiovascular system: S1 & S2. No pedal edema. Gastrointestinal system: Abdomen is nondistended, soft and nontender. No organomegaly or masses felt. Normal bowel sounds heard. Central nervous system: Alert and oriented. No focal neurological deficits. Extremities: Symmetric 5 x 5 power.  Data Reviewed: I have personally reviewed following labs and imaging studies  CBC: Recent Labs  Lab 04/09/17 2323 04/10/17 0608  WBC 9.9 9.1  HGB 18.1* 15.9  HCT 51.8 45.4  MCV 95.7 94.8  PLT 254 225   Basic Metabolic Panel: Recent Labs  Lab 04/09/17 2323 04/10/17 0608 04/11/17 1004  NA 135  --  135  K 4.3  --  4.1  CL 105  --  104  CO2 22  --  24  GLUCOSE 104*  --  102*  BUN 9  --  8  CREATININE 1.06 1.06 0.97  CALCIUM 8.6*  --  8.3*   GFR: Estimated Creatinine Clearance: 91 mL/min (by C-G formula based on SCr of 0.97 mg/dL). Liver Function Tests: Recent Labs  Lab 04/10/17 0230  AST 26  ALT  18  ALKPHOS 86  BILITOT 0.5  PROT 6.5  ALBUMIN 3.6   Recent Labs  Lab 04/10/17 0230  LIPASE 34   No results for input(s): AMMONIA in the last 168 hours. Coagulation Profile: Recent Labs  Lab 04/11/17 1004  INR 0.88   Cardiac Enzymes: Recent Labs  Lab 04/10/17 0741 04/10/17 1242 04/10/17 1858  TROPONINI <0.03 <0.03 <0.03   BNP (last 3 results) No results for input(s): PROBNP in the last 8760 hours. HbA1C: No results for input(s): HGBA1C in the last 72 hours. CBG: No results for input(s): GLUCAP in the last 168  hours. Lipid Profile: No results for input(s): CHOL, HDL, LDLCALC, TRIG, CHOLHDL, LDLDIRECT in the last 72 hours. Thyroid Function Tests: No results for input(s): TSH, T4TOTAL, FREET4, T3FREE, THYROIDAB in the last 72 hours. Anemia Panel: No results for input(s): VITAMINB12, FOLATE, FERRITIN, TIBC, IRON, RETICCTPCT in the last 72 hours. Urine analysis:    Component Value Date/Time   COLORURINE YELLOW 01/29/2016 0051   APPEARANCEUR CLEAR 01/29/2016 0051   LABSPEC 1.016 01/29/2016 0051   PHURINE 5.5 01/29/2016 0051   GLUCOSEU NEGATIVE 01/29/2016 0051   HGBUR NEGATIVE 01/29/2016 0051   BILIRUBINUR NEGATIVE 01/29/2016 0051   KETONESUR NEGATIVE 01/29/2016 0051   PROTEINUR NEGATIVE 01/29/2016 0051   UROBILINOGEN 1.0 01/15/2013 1135   NITRITE NEGATIVE 01/29/2016 0051   LEUKOCYTESUR NEGATIVE 01/29/2016 0051   Sepsis Labs: @LABRCNTIP (procalcitonin:4,lacticidven:4)  )No results found for this or any previous visit (from the past 240 hour(s)).       Radiology Studies: Dg Chest 2 View  Result Date: 04/09/2017 CLINICAL DATA:  Mid chest pain and shortness of breath for 72 hours, smoker, previous heart surgery EXAM: CHEST  2 VIEW COMPARISON:  Chest x-ray dated 10/08/2016. chest x-ray dated 08/05/2015. FINDINGS: Heart size and mediastinal contours are normal. Median sternotomy wires appear intact and stable in alignment. Stable small benign granuloma within the right upper lobe. Lungs otherwise clear. No pleural effusion or pneumothorax seen. No acute or suspicious osseous finding. IMPRESSION: No active cardiopulmonary disease. No evidence of pneumonia or pulmonary edema. Electronically Signed   By: Bary Richard M.D.   On: 04/09/2017 23:41   Ct Abdomen Pelvis W Contrast  Result Date: 04/10/2017 CLINICAL DATA:  Abdominal pain. EXAM: CT ABDOMEN AND PELVIS WITH CONTRAST TECHNIQUE: Multidetector CT imaging of the abdomen and pelvis was performed using the standard protocol following bolus  administration of intravenous contrast. CONTRAST:  100 mL Isovue-300 COMPARISON:  04/10/2016 FINDINGS: Lower chest: Lung bases are clear. Postoperative changes in the mediastinum. Hepatobiliary: No focal liver abnormality is seen. No gallstones, gallbladder wall thickening, or biliary dilatation. Pancreas: Unremarkable. No pancreatic ductal dilatation or surrounding inflammatory changes. Spleen: Normal in size without focal abnormality. Adrenals/Urinary Tract: Adrenal glands are unremarkable. Kidneys are normal, without renal calculi, focal lesion, or hydronephrosis. Bladder is unremarkable. Stomach/Bowel: Stomach is within normal limits. Appendix appears normal. No evidence of bowel wall thickening, distention, or inflammatory changes. Vascular/Lymphatic: Aortic atherosclerosis. No enlarged abdominal or pelvic lymph nodes. Reproductive: Prostate is unremarkable. Other: No free air or free fluid in the abdomen. Small periumbilical hernia containing fat. Musculoskeletal: No acute or significant osseous findings. IMPRESSION: 1. No acute process demonstrated in the abdomen or pelvis. No evidence of bowel obstruction or inflammation. Appendix is normal. 2. No renal or ureteral stone or obstruction. 3. Aortic atherosclerosis. Electronically Signed   By: Burman Nieves M.D.   On: 04/10/2017 03:22        Scheduled Meds: . aspirin  81 mg Oral Pre-Cath  . [MAR Hold] aspirin EC  325 mg Oral Daily  . [MAR Hold] diazepam  2 mg Oral TID  . [MAR Hold] folic acid  1 mg Oral Daily  . [MAR Hold] metoprolol tartrate  25 mg Oral BID  . [MAR Hold] multivitamin with minerals  1 tablet Oral Daily  . [MAR Hold] rosuvastatin  10 mg Oral q1800  . sodium chloride flush  3 mL Intravenous Q12H  . [MAR Hold] thiamine  100 mg Oral Daily   Or  . [MAR Hold] thiamine  100 mg Intravenous Daily   Continuous Infusions: . sodium chloride    . [START ON 04/12/2017] sodium chloride 3 mL/kg/hr (04/11/17 1000)   Followed by  .  [START ON 04/12/2017] sodium chloride    . adenosine (diagnostic) Stopped (04/11/17 1644)  . heparin       LOS: 0 days    Time spent: 35 minutes.    Berton Mount, MD  [facility] Pager #: [phone] 7PM-7AM contact night coverage as above

## 2017-04-11 NOTE — Progress Notes (Signed)
Site area: right groin  Site Prior to Removal:  Level 1  Pressure Applied For 30 MINUTES    Minutes Beginning at 20:55  Manual:   Yes.    Patient Status During Pull:  WNL  Post Pull Groin Site:  Level 1  Post Pull Instructions Given:  Yes.    Post Pull Pulses Present:  Yes.    Dressing Applied:  Yes.    Comments:  Pressure dressing applied. Pt tolerated well.

## 2017-04-11 NOTE — Interval H&P Note (Signed)
History and Physical Interval Note:  04/11/2017 3:27 PM  Douglas EndsJonathan [name]  has presented today for cardiac catheterization, with the diagnosis of unstable angina. The various methods of treatment have been discussed with the patient and family. After consideration of risks, benefits and other options for treatment, the patient has consented to  Procedure(s): LEFT HEART CATH AND CORS/GRAFTS ANGIOGRAPHY (N/A) as a surgical intervention .  The patient's history has been reviewed, patient examined, no change in status, stable for surgery.  I have reviewed the patient's chart and labs.  Questions were answered to the patient's satisfaction.    Cath Lab Visit (complete for each Cath Lab visit)  Clinical Evaluation Leading to the Procedure:   ACS: Yes.    Non-ACS:  N/A  Emilyanne Mcgough

## 2017-04-12 ENCOUNTER — Encounter (HOSPITAL_COMMUNITY): Payer: Self-pay | Admitting: Internal Medicine

## 2017-04-12 DIAGNOSIS — Z9119 Patient's noncompliance with other medical treatment and regimen: Secondary | ICD-10-CM

## 2017-04-12 DIAGNOSIS — Z9861 Coronary angioplasty status: Secondary | ICD-10-CM

## 2017-04-12 DIAGNOSIS — I700 Atherosclerosis of aorta: Secondary | ICD-10-CM

## 2017-04-12 DIAGNOSIS — I259 Chronic ischemic heart disease, unspecified: Secondary | ICD-10-CM

## 2017-04-12 DIAGNOSIS — Z951 Presence of aortocoronary bypass graft: Secondary | ICD-10-CM

## 2017-04-12 DIAGNOSIS — F1010 Alcohol abuse, uncomplicated: Secondary | ICD-10-CM

## 2017-04-12 LAB — CBC
HEMATOCRIT: 45.8 % (ref 39.0–52.0)
HEMOGLOBIN: 15.1 g/dL (ref 13.0–17.0)
MCH: 32.1 pg (ref 26.0–34.0)
MCHC: 33 g/dL (ref 30.0–36.0)
MCV: 97.2 fL (ref 78.0–100.0)
Platelets: 221 10*3/uL (ref 150–400)
RBC: 4.71 MIL/uL (ref 4.22–5.81)
RDW: 13.4 % (ref 11.5–15.5)
WBC: 7.6 10*3/uL (ref 4.0–10.5)

## 2017-04-12 LAB — BASIC METABOLIC PANEL
ANION GAP: 10 (ref 5–15)
BUN: 12 mg/dL (ref 6–20)
CO2: 24 mmol/L (ref 22–32)
Calcium: 8.3 mg/dL — ABNORMAL LOW (ref 8.9–10.3)
Chloride: 101 mmol/L (ref 101–111)
Creatinine, Ser: 1.36 mg/dL — ABNORMAL HIGH (ref 0.61–1.24)
GFR calc Af Amer: >60 mL/min (ref >60)
GFR, EST NON AFRICAN AMERICAN: 59 mL/min — AB (ref >60)
Glucose, Bld: 106 mg/dL — ABNORMAL HIGH (ref 65–99)
POTASSIUM: 3.8 mmol/L (ref 3.5–5.1)
Sodium: 135 mmol/L (ref 135–145)

## 2017-04-12 MED ORDER — CLOPIDOGREL BISULFATE 75 MG PO TABS: 75.0000 mg | ORAL_TABLET | Freq: Every day | ORAL | 0 refills | Status: DC

## 2017-04-12 MED ORDER — METOPROLOL TARTRATE 25 MG PO TABS: 25.0000 mg | ORAL_TABLET | Freq: Two times a day (BID) | ORAL | 0 refills | Status: DC

## 2017-04-12 MED ORDER — NITROGLYCERIN 0.4 MG SL SUBL: 0.4000 mg | SUBLINGUAL_TABLET | SUBLINGUAL | 0 refills | Status: DC | PRN

## 2017-04-12 MED ORDER — ASPIRIN 81 MG PO CHEW: 81.0000 mg | CHEWABLE_TABLET | Freq: Every day | ORAL | 0 refills | Status: DC

## 2017-04-12 MED ORDER — ROSUVASTATIN CALCIUM 10 MG PO TABS: 10.0000 mg | ORAL_TABLET | Freq: Every day | ORAL | 0 refills | Status: DC

## 2017-04-12 MED ORDER — ACETAMINOPHEN 325 MG PO TABS: 650.0000 mg | ORAL_TABLET | ORAL | Status: DC | PRN

## 2017-04-12 NOTE — Discharge Summary (Signed)
DISCHARGE SUMMARY  Douglas EndsJonathan [name]  MR#: [id number]  DOB:1965-11-03  Date of Admission: 04/10/2017 Date of Discharge: 04/12/2017  Attending Physician:Wilhelmenia Addis Silvestre Gunner Natisha Trzcinski, MD  Patient's WUJ:[id number]:Patient, No Pcp Per  Consults:  Cardiology - Dr. Donnie Ahoilley   Disposition: D/C home   Follow-up Appts: Follow-up Information    [facility] Follow up on 04/23/2017.   Why:  10 am for hospital follow up Contact information: [address] [place] [phone]       Othella Boyerilley, William S, MD. Schedule an appointment as soon as possible for a visit in 1 week(s).   Specialty:  Cardiology Contact information: [address] [place] [address] [phone]           Tests Needing Follow-up: -assessment of renal function is suggested as crt increased mildly post cardiac cath  -assessment of medication compliance -encourage abstinence from alcohol and tobacco   Discharge Diagnoses: CAD s/p CABG - Chest pain  Hyperlipidemia PVD w/ claudication - R femoral artery  Alcohol abuse Tobacco abuse  Noncompliance  Initial presentation: 52 year old male with a history of CAD status post CABG 2018, alcohol abuse, and alcoholic pancreatitis who was admitted with chest pain with typical and atypical features.    Hospital Course:  CAD s/p CABG - Chest pain  Troponin obtained in serial fashion was negative.  Patient continued to report vague left upper chest pain.  Cardiology was consulted and completed a cardiac catheterization 04/11/17 which noted "occlusion of all of his saphenous vein grafts with PCI done of the RCA."  As per Dr. Donnie Ahoilley "he has residual disease involving the diagonal, OM1 and 2 and the distal PL branch on my review."  Pt counsled on importance of ongoing medical tx.  Pt to f/u w/ Dr. Donnie Ahoilley 1 week post d/c.  Hyperlipidemia Continue crestor after d/c   PVD w/ claudication - R femoral artery  Noted at  time of cardiac cath - as per Cardiology "he may need help with the right femoral artery for continuous complain of claudication in the future."  Alcohol and tobacco abuse Patient has been counseled to quit alcohol and tobacco  Noncompliance Patient has been counseled on absolute importance of compliance w/ medications and doctor visits    Allergies as of 04/12/2017      Reactions   Ibuprofen Nausea And Vomiting   UPSETS STOMACH   Shellfish Allergy Nausea And Vomiting      Medication List    TAKE these medications   acetaminophen 325 MG tablet Commonly known as:  TYLENOL Take 2 tablets (650 mg total) by mouth every 4 (four) hours as needed for headache or mild pain.   aspirin 81 MG chewable tablet Chew 1 tablet (81 mg total) by mouth daily. Start taking on:  04/13/2017   clopidogrel 75 MG tablet Commonly known as:  PLAVIX Take 1 tablet (75 mg total) by mouth daily with breakfast. Start taking on:  04/13/2017   metoprolol tartrate 25 MG tablet Commonly known as:  LOPRESSOR Take 1 tablet (25 mg total) by mouth 2 (two) times daily.   nitroGLYCERIN 0.4 MG SL tablet Commonly known as:  NITROSTAT Place 1 tablet (0.4 mg total) under the tongue every 5 (five) minutes as needed for chest pain.   rosuvastatin 10 MG tablet Commonly known as:  CRESTOR Take 1 tablet (10 mg total) by mouth daily at 6 PM.       Day of Discharge BP 107/70 (BP Location:  Left Arm)   Pulse 75   Temp 98.2 F (36.8 C) (Oral)   Resp 17   Ht 5\' 11"  (1.803 m)   Wt 70.6 kg (155 lb 10.3 oz)   SpO2 96%   BMI 21.71 kg/m   Physical Exam: General: No acute respiratory distress Lungs: Clear to auscultation bilaterally without wheezes or crackles Cardiovascular: Regular rate and rhythm without murmur gallop or rub normal S1 and S2 Abdomen: Nontender, nondistended, soft, bowel sounds positive, no rebound, no ascites, no appreciable mass Extremities: No significant cyanosis, clubbing, or edema bilateral  lower extremities  Basic Metabolic Panel: Recent Labs  Lab 04/09/17 2323 04/10/17 0608 04/11/17 1004 04/12/17 0503  NA 135  --  135 135  K 4.3  --  4.1 3.8  CL 105  --  104 101  CO2 22  --  24 24  GLUCOSE 104*  --  102* 106*  BUN 9  --  8 12  CREATININE 1.06 1.06 0.97 1.36*  CALCIUM 8.6*  --  8.3* 8.3*    Liver Function Tests: Recent Labs  Lab 04/10/17 0230  AST 26  ALT 18  ALKPHOS 86  BILITOT 0.5  PROT 6.5  ALBUMIN 3.6   Recent Labs  Lab 04/10/17 0230  LIPASE 34   Coags: Recent Labs  Lab 04/11/17 1004  INR 0.88   CBC: Recent Labs  Lab 04/09/17 2323 04/10/17 0608 04/12/17 0503  WBC 9.9 9.1 7.6  HGB 18.1* 15.9 15.1  HCT 51.8 45.4 45.8  MCV 95.7 94.8 97.2  PLT 254 225 221    Cardiac Enzymes: Recent Labs  Lab 04/10/17 0741 04/10/17 1242 04/10/17 1858  TROPONINI <0.03 <0.03 <0.03    Time spent in discharge (includes decision making & examination of pt): 30 minutes  04/12/2017, 2:38 PM   Lonia Blood, MD [facility] Office  [phone] Pager [phone]  On-Call/Text Page:      [email or web address]      password [email or web address]    c

## 2017-04-12 NOTE — Progress Notes (Signed)
 2mg  morphine witnessed and  wasted in sink by Victorino DecemberLaura Cox RN.

## 2017-04-12 NOTE — Progress Notes (Signed)
Wasted 2 mg morphine 0.5 mls with Seymour BarsNeri Abordo RN at 2115  04/12/2017 Unable to document in pyxis as patient had been removed from pyxis roster

## 2017-04-12 NOTE — Discharge Instructions (Signed)
Angina Pectoris Angina pectoris is a very bad feeling in the chest, neck, or arm. Your doctor may call it angina. There are four types of angina. Angina is caused by a lack of blood in the middle and thickest layer of the heart wall (myocardium). Angina may feel like a crushing or squeezing pain in the chest. It may feel like tightness or heavy pressure in the chest. Some people say it feels like gas, heartburn, or indigestion. Some people have symptoms other than pain. These include:  Shortness of breath.  Cold sweats.  Feeling sick to your stomach (nausea).  Feeling light-headed.  Many women have chest discomfort and some of the other symptoms. However, women often have different symptoms, such as:  Feeling tired (fatigue).  Feeling nervous for no reason.  Feeling weak for no reason.  Dizziness or fainting.  Women may have angina without any symptoms. Follow these instructions at home:  Take medicines only as told by your doctor.  Take care of other health issues as told by your doctor. These include: ? High blood pressure (hypertension). ? Diabetes.  Follow a heart-healthy diet. Your doctor can help you to choose healthy food options and make changes.  Talk to your doctor to learn more about healthy cooking methods and use them. These include: ? Roasting. ? Grilling. ? Broiling. ? Baking. ? Poaching. ? Steaming. ? Stir-frying.  Follow an exercise program approved by your doctor.  Keep a healthy weight. Lose weight as told by your doctor.  Rest when you are tired.  Learn to manage stress.  Do not use any tobacco, such as cigarettes, chewing tobacco, or electronic cigarettes. If you need help quitting, ask your doctor.  If you drink alcohol, and your doctor says it is okay, limit yourself to no more than 1 drink per day. One drink equals 12 ounces of beer, 5 ounces of wine, or 1 ounces of hard liquor.  Stop illegal drug use.  Keep all follow-up visits as told  by your doctor. This is important. Do not take these medicines unless your doctor says that you can:  Nonsteroidal anti-inflammatory drugs (NSAIDs). These include: ? Ibuprofen. ? Naproxen. ? Celecoxib.  Vitamin supplements that have vitamin A, vitamin E, or both.  Hormone therapy that contains estrogen with or without progestin.  Get help right away if:  You have pain in your chest, neck, arm, jaw, stomach, or back that: ? Lasts more than a few minutes. ? Comes back. ? Does not get better after you take medicine under your tongue (sublingual nitroglycerin).  You have any of these symptoms for no reason: ? Gas, heartburn, or indigestion. ? Sweating a lot. ? Shortness of breath or trouble breathing. ? Feeling sick to your stomach or throwing up. ? Feeling more tired than usual. ? Feeling nervous or worrying more than usual. ? Feeling weak. ? Diarrhea.  You are suddenly dizzy or light-headed.  You faint or pass out. These symptoms may be an emergency. Do not wait to see if the symptoms will go away. Get medical help right away. Call your local emergency services (911 in the [place] Do not drive yourself to the hospital. This information is not intended to replace advice given to you by your health care provider. Make sure you discuss any questions you have with your health care provider. Document Released: 08/15/2007 Document Revised: 08/04/2015 Document Reviewed: 06/30/2013 Elsevier Interactive Patient Education  2017 Elsevier Inc.  

## 2017-04-12 NOTE — Progress Notes (Addendum)
Subjective:  Complains of soreness where he had the catheterization yesterday.  Cath site appears relatively stable today.  He also was noted to have a stenosis in his common femoral artery and does complain of some claudication there.  Complains of soreness in the left chest area but no angina.  Objective:  Vital Signs in the last 24 hours: BP 127/70 (BP Location: Left Arm)   Pulse 74   Temp 98 F (36.7 C) (Oral)   Resp 17   Ht 5\' 11"  (1.803 m)   Wt 70.6 kg (155 lb 10.3 oz)   SpO2 96%   BMI 21.71 kg/m   Physical Exam: Thin white male in no acute distress Lungs:  Clear Cardiac:  Regular rhythm, normal S1 and S2, no S3 Extremities:  No edema present, femoral catheterization site is clean and dry with minimal ecchymoses.  Intake/Output from previous day: 01/31 0701 - 02/01 0700 In: 625 [P.O.:250; I.V.:375] Out: 1400 [Urine:1400]  Weight Filed Weights   04/09/17 2244 04/11/17 0226 04/12/17 0209  Weight: 69.9 kg (154 lb) 70.6 kg (155 lb 10.3 oz) 70.6 kg (155 lb 10.3 oz)    Lab Results: Basic Metabolic Panel: Recent Labs    04/11/17 1004 04/12/17 0503  NA 135 135  K 4.1 3.8  CL 104 101  CO2 24 24  GLUCOSE 102* 106*  BUN 8 12  CREATININE 0.97 1.36*   CBC: Recent Labs    04/10/17 0608 04/12/17 0503  WBC 9.1 7.6  HGB 15.9 15.1  HCT 45.4 45.8  MCV 94.8 97.2  PLT 225 221   Cardiac Enzymes: Troponin (Point of Care Test) Recent Labs    04/09/17 2333  TROPIPOC 0.01   Cardiac Panel (last 3 results) Recent Labs    04/10/17 0741 04/10/17 1242 04/10/17 1858  TROPONINI <0.03 <0.03 <0.03    Telemetry: personally reviewed.  Sinus rhythm today heart rate slower  Assessment/Plan:  1.  CAD with bypass graft disease with occlusion of all of his saphenous vein grafts with PCI done of the RCA.  He has residual disease involving the diagonal, OM1 and 2 and the distal PL branch on my review. 2.  Hyperlipidemia 3.  Peripheral vascular disease with claudication 4.   Noncompliance in the past  Recommendations:  Appreciate the help of the interventionalists yesterday.  Discussed importance of compliance with patient.  He asks about disability in encouraged him to apply to see what they say.  He may need help with the right femoral artery for continuous complain of claudication in the future.  I recommended that he keep his follow-up.  He actually has never showed up to see me in the office.  I would discharge him on metoprolol, either rosuvastatin or atorvastatin and generic Plavix as well as aspirin.  Discussed importance of compliance as well as lifestyle modification.  Cardiac rehabilitation.  I would like to see in the office in one week.  Douglas [name], [name]  MD [facility]  04/12/2017, 9:05 AM

## 2017-04-12 NOTE — Progress Notes (Signed)
CARDIAC REHAB PHASE I   PRE:  Rate/Rhythm: 90 SR  BP:  Supine:   Sitting: 117/70  Standing:    SaO2:   MODE:  Ambulation: 500 ft   POST:  Rate/Rhythm: 91 SR  BP:  Supine: 131/77  Sitting:   Standing:    SaO2:  0915-1005 Pt walked 500 ft on RA with c/o groin pain. Had received pain med. Education completed with pt who voiced understanding. Stressed importance of plavix with PTCA, NTG use, ex ed and gave heart healthy diet. Discussed smoking cessation and gave fake cigarette as pt lost the one given last time we saw him. Pt stated he knows he has to stop. Gave smoking cessation handout and encouraged him to call 1800quitnow. Will refer to [place] CRP 2 but pt has no insurance. Discussed with pt that he might be able to do Maintenance if he can afford. Will let outpatient CRP follow up. Encouraged pt to take his meds.   Luetta Nuttingharlene Senai Kingsley, RN BSN  04/12/2017 10:03 AM

## 2017-04-16 ENCOUNTER — Telehealth (HOSPITAL_COMMUNITY): Payer: Self-pay

## 2017-04-16 NOTE — Telephone Encounter (Signed)
Referral received and patient is medicaid potential. Attempted to call patient in regards to insurance and offer maintenance - lm on vm

## 2017-04-23 ENCOUNTER — Inpatient Hospital Stay: Payer: Self-pay | Admitting: Nurse Practitioner

## 2017-04-25 ENCOUNTER — Encounter (HOSPITAL_COMMUNITY): Payer: Self-pay

## 2017-04-25 ENCOUNTER — Telehealth (HOSPITAL_COMMUNITY): Payer: Self-pay

## 2017-04-25 NOTE — Telephone Encounter (Signed)
2nd attempt to call patient in regards to Cardiac Rehab and insurance - lm on vm. Sending letter. °

## 2017-05-08 ENCOUNTER — Emergency Department (HOSPITAL_COMMUNITY)
Admission: EM | Admit: 2017-05-08 | Discharge: 2017-05-08 | Disposition: A | Discharge status: Home / Self Care | Payer: Self-pay | Attending: Emergency Medicine | Admitting: Emergency Medicine

## 2017-05-08 ENCOUNTER — Encounter (HOSPITAL_COMMUNITY): Payer: Self-pay

## 2017-05-08 ENCOUNTER — Telehealth (HOSPITAL_COMMUNITY): Payer: Self-pay

## 2017-05-08 DIAGNOSIS — Z79899 Other long term (current) drug therapy: Secondary | ICD-10-CM | POA: Insufficient documentation

## 2017-05-08 DIAGNOSIS — Z7902 Long term (current) use of antithrombotics/antiplatelets: Secondary | ICD-10-CM | POA: Insufficient documentation

## 2017-05-08 DIAGNOSIS — Z7982 Long term (current) use of aspirin: Secondary | ICD-10-CM | POA: Insufficient documentation

## 2017-05-08 DIAGNOSIS — I2510 Atherosclerotic heart disease of native coronary artery without angina pectoris: Secondary | ICD-10-CM | POA: Insufficient documentation

## 2017-05-08 DIAGNOSIS — F17210 Nicotine dependence, cigarettes, uncomplicated: Secondary | ICD-10-CM | POA: Insufficient documentation

## 2017-05-08 DIAGNOSIS — K0889 Other specified disorders of teeth and supporting structures: Secondary | ICD-10-CM | POA: Insufficient documentation

## 2017-05-08 DIAGNOSIS — Z955 Presence of coronary angioplasty implant and graft: Secondary | ICD-10-CM | POA: Insufficient documentation

## 2017-05-08 DIAGNOSIS — Z951 Presence of aortocoronary bypass graft: Secondary | ICD-10-CM | POA: Insufficient documentation

## 2017-05-08 MED ORDER — PENICILLIN V POTASSIUM 500 MG PO TABS
500.0000 mg | ORAL_TABLET | Freq: Once | ORAL | Status: AC
  Administered 2017-05-08: 500 mg via ORAL
  Filled 2017-05-08: qty 1

## 2017-05-08 MED ORDER — PENICILLIN V POTASSIUM 500 MG PO TABS: 500.0000 mg | ORAL_TABLET | Freq: Four times a day (QID) | ORAL | 0 refills | Status: DC

## 2017-05-08 MED ORDER — HYDROCODONE-ACETAMINOPHEN 5-325 MG PO TABS
2.0000 | ORAL_TABLET | Freq: Once | ORAL | Status: AC
  Administered 2017-05-08: 2 via ORAL
  Filled 2017-05-08: qty 2

## 2017-05-08 NOTE — Telephone Encounter (Signed)
3rd attempt to call patient in regards to Cardiac Rehab - lm on vm °

## 2017-05-08 NOTE — ED Notes (Signed)
Bed: [facility] Expected date:  Expected time:  Means of arrival:  Comments: 

## 2017-05-08 NOTE — ED Triage Notes (Signed)
Pt complains of upper left jaw pain form an abscess

## 2017-05-08 NOTE — ED Provider Notes (Signed)
[facility]-EMERGENCY DEPT Provider Note   CSN: [id number] Arrival date & time: 05/08/17  0105     History   Chief Complaint Chief Complaint  Patient presents with  . Dental Pain    HPI Douglas [name] is a 52 y.o. male.  Patient presents to the emergency department with a dental complaint. Symptoms began yesterday. The patient has tried to alleviate pain with aspirin.  Pain rated at a 10/10, characterized as throbbing in nature and located left upper molars. Patient denies fever, night sweats, chills, difficulty swallowing or opening mouth, SOB, nuchal rigidity or decreased ROM of neck.  Has a dentist appointment tomorrow.    The history is provided by the patient. No language interpreter was used.    Past Medical History:  Diagnosis Date  . Anginal pain (HCC)   . Coronary artery disease   . GERD (gastroesophageal reflux disease)   . History of acute pancreatitis 04/06/2015  . NSTEMI (non-ST elevated myocardial infarction) (HCC) 01/2014  . Pancreatitis   . PTSD (post-traumatic stress disorder)   . Tobacco abuse     Patient Active Problem List   Diagnosis Date Noted  . Aortic atherosclerosis (HCC) 04/10/2017  . Patient's noncompliance with other medical treatment and regimen 04/10/2017  . CAD (coronary artery disease), native coronary artery 03/15/2016  . Pancreatic divisum 04/18/2015  . History of acute pancreatitis 04/06/2015  . Alcohol abuse 03/09/2015  . Fatty liver 03/09/2015  . Tobacco abuse     Past Surgical History:  Procedure Laterality Date  . CARDIAC CATHETERIZATION N/A 03/14/2016   Procedure: Left Heart Cath and Coronary Angiography;  Surgeon: Kathleene Hazel, MD;  Location: [facility] INVASIVE CV LAB;  Service: Cardiovascular;  Laterality: N/A;  . CORONARY ANGIOPLASTY WITH STENT PLACEMENT  01/18/2014   "1"  . CORONARY ARTERY BYPASS GRAFT N/A 03/19/2016   Procedure: CORONARY ARTERY BYPASS GRAFTING (CABG), ON PUMP, TIMES FIVE, USING  LEFT INTERNAL MAMMARY ARTERY AND RIGHT GREATER SAPHENOUS VEIN HARVESTED ENDOSCOPICALLY;  Surgeon: Delight Ovens, MD;  Location: [facility] OR;  Service: Open Heart Surgery;  Laterality: N/A;  LIMA-LAD SVG-OM SVG-DIAG SEQ SVG-PD-PL  . CORONARY BALLOON ANGIOPLASTY N/A 04/11/2017   Procedure: CORONARY BALLOON ANGIOPLASTY;  Surgeon: Yvonne Kendall, MD;  Location: [facility] INVASIVE CV LAB;  Service: Cardiovascular;  Laterality: N/A;  Distal RCA  . INTRAVASCULAR PRESSURE WIRE/FFR STUDY N/A 04/11/2017   Procedure: INTRAVASCULAR PRESSURE WIRE/FFR STUDY;  Surgeon: Yvonne Kendall, MD;  Location: [facility] INVASIVE CV LAB;  Service: Cardiovascular;  Laterality: N/A;  . LEFT HEART CATH AND CORS/GRAFTS ANGIOGRAPHY N/A 04/11/2017   Procedure: LEFT HEART CATH AND CORS/GRAFTS ANGIOGRAPHY;  Surgeon: Yvonne Kendall, MD;  Location: [facility] INVASIVE CV LAB;  Service: Cardiovascular;  Laterality: N/A;  . LEFT HEART CATHETERIZATION WITH CORONARY ANGIOGRAM N/A 01/18/2014   Procedure: LEFT HEART CATHETERIZATION WITH CORONARY ANGIOGRAM;  Surgeon: Runell Gess, MD;  Location: [facility] CATH LAB;  Service: Cardiovascular;  Laterality: N/A;  . TEE WITHOUT CARDIOVERSION N/A 03/19/2016   Procedure: TRANSESOPHAGEAL ECHOCARDIOGRAM (TEE);  Surgeon: Delight Ovens, MD;  Location: [facility] OR;  Service: Open Heart Surgery;  Laterality: N/A;       Home Medications    Prior to Admission medications   Medication Sig Start Date End Date Taking? Authorizing Provider  acetaminophen (TYLENOL) 325 MG tablet Take 2 tablets (650 mg total) by mouth every 4 (four) hours as needed for headache or mild pain. 04/12/17   Lonia Blood, MD  aspirin 81 MG chewable tablet Chew 1 tablet (  81 mg total) by mouth daily. 04/13/17   Lonia BloodMcClung, Jeffrey T, MD  clopidogrel (PLAVIX) 75 MG tablet Take 1 tablet (75 mg total) by mouth daily with breakfast. 04/13/17   Lonia BloodMcClung, Jeffrey T, MD  metoprolol tartrate (LOPRESSOR) 25 MG tablet Take 1 tablet (25 mg total) by mouth 2 (two) times  daily. 04/12/17   Lonia BloodMcClung, Jeffrey T, MD  nitroGLYCERIN (NITROSTAT) 0.4 MG SL tablet Place 1 tablet (0.4 mg total) under the tongue every 5 (five) minutes as needed for chest pain. 04/12/17   Lonia BloodMcClung, Jeffrey T, MD  penicillin v potassium (VEETID) 500 MG tablet Take 1 tablet (500 mg total) by mouth 4 (four) times daily. 05/08/17   Roxy HorsemanBrowning, Deseree Zemaitis, PA-C  rosuvastatin (CRESTOR) 10 MG tablet Take 1 tablet (10 mg total) by mouth daily at 6 PM. 04/12/17   Lonia BloodMcClung, Jeffrey T, MD    Family History Family History  Problem Relation Age of Onset  . Cancer Mother        Lung cancer  . Coronary artery disease Mother   . Heart failure Maternal Grandmother     Social History Social History   Tobacco Use  . Smoking status: Current Every Day Smoker    Packs/day: 1.00    Years: 31.00    Pack years: 31.00    Types: Cigarettes  . Smokeless tobacco: Never Used  Substance Use Topics  . Alcohol use: Yes    Alcohol/week: 0.6 oz    Types: 1 Cans of beer per week    Comment: occ  . Drug use: No    Comment: denies 06/17/2015     Allergies   Ibuprofen and Shellfish allergy   Review of Systems Review of Systems  Constitutional: Negative for chills and fever.  HENT: Positive for dental problem. Negative for drooling.   Neurological: Negative for speech difficulty.  Psychiatric/Behavioral: Positive for sleep disturbance.     Physical Exam Updated Vital Signs BP (!) 155/108 (BP Location: Right Arm)   Pulse (!) 101   Temp 97.8 F (36.6 C) (Oral)   Resp 18   SpO2 99%   Physical Exam Physical Exam  Constitutional: Pt appears well-developed and well-nourished.  HENT:  Head: Normocephalic.  Right Ear: Tympanic membrane, external ear and ear canal normal.  Left Ear: Tympanic membrane, external ear and ear canal normal.  Nose: Nose normal. Right sinus exhibits no maxillary sinus tenderness and no frontal sinus tenderness. Left sinus exhibits no maxillary sinus tenderness and no frontal sinus  tenderness.  Mouth/Throat: Uvula is midline, oropharynx is clear and moist and mucous membranes are normal. No oral lesions. No uvula swelling or lacerations. No oropharyngeal exudate, posterior oropharyngeal edema, posterior oropharyngeal erythema or tonsillar abscesses.  Poor dentition No gingival swelling, fluctuance or induration No gross abscess  No sublingual edema, tenderness to palpation, or sign of Ludwig's angina, or deep space infection Pain at left upper molars Eyes: Conjunctivae are normal. Pupils are equal, round, and reactive to light. Right eye exhibits no discharge. Left eye exhibits no discharge.  Neck: Normal range of motion. Neck supple.  No stridor Handling secretions without difficulty No nuchal rigidity No cervical lymphadenopathy Cardiovascular: Normal rate, regular rhythm and normal heart sounds.   Pulmonary/Chest: Effort normal. No respiratory distress.  Equal chest rise  Abdominal: Soft. Bowel sounds are normal. Pt exhibits no distension. There is no tenderness.  Lymphadenopathy: Pt has no cervical adenopathy.  Neurological: Pt is alert and oriented x 4  Skin: Skin is warm and dry.  Psychiatric:  Pt has a normal mood and affect.  Nursing note and vitals reviewed.    ED Treatments / Results  Labs (all labs ordered are listed, but only abnormal results are displayed) Labs Reviewed - No data to display  EKG  EKG Interpretation None       Radiology No results found.  Procedures Procedures (including critical care time)  Medications Ordered in ED Medications  HYDROcodone-acetaminophen (NORCO/VICODIN) 5-325 MG per tablet 2 tablet (not administered)  penicillin v potassium (VEETID) tablet 500 mg (not administered)     Initial Impression / Assessment and Plan / ED Course  I have reviewed the triage vital signs and the nursing notes.  Pertinent labs & imaging results that were available during my care of the patient were reviewed by me and  considered in my medical decision making (see chart for details).     Patient with dentalgia.  No abscess requiring immediate incision and drainage.  Exam not concerning for Ludwig's angina or pharyngeal abscess.  Will treat with penicillin. Pt instructed to follow-up with dentist.  Discussed return precautions. Pt safe for discharge.   Final Clinical Impressions(s) / ED Diagnoses   Final diagnoses:  Pain, dental    ED Discharge Orders        Ordered    penicillin v potassium (VEETID) 500 MG tablet  4 times daily     05/08/17 0308       Roxy Horseman, PA-C 05/08/17 0310    Zadie Rhine, MD 05/08/17 2312

## 2017-06-12 ENCOUNTER — Telehealth (HOSPITAL_COMMUNITY): Payer: Self-pay

## 2017-06-12 NOTE — Telephone Encounter (Signed)
No response from patient - Closed referral. °

## 2017-08-18 ENCOUNTER — Inpatient Hospital Stay (HOSPITAL_COMMUNITY)
Admission: EM | Admit: 2017-08-18 | Discharge: 2017-08-21 | DRG: 159 | Disposition: A | Discharge status: Home / Self Care | Payer: Self-pay | Attending: Internal Medicine | Admitting: Internal Medicine

## 2017-08-18 ENCOUNTER — Encounter (HOSPITAL_COMMUNITY): Payer: Self-pay | Admitting: Emergency Medicine

## 2017-08-18 ENCOUNTER — Emergency Department (HOSPITAL_COMMUNITY): Payer: Self-pay

## 2017-08-18 ENCOUNTER — Other Ambulatory Visit: Payer: Self-pay

## 2017-08-18 DIAGNOSIS — Z8249 Family history of ischemic heart disease and other diseases of the circulatory system: Secondary | ICD-10-CM

## 2017-08-18 DIAGNOSIS — D751 Secondary polycythemia: Secondary | ICD-10-CM | POA: Diagnosis present

## 2017-08-18 DIAGNOSIS — I70201 Unspecified atherosclerosis of native arteries of extremities, right leg: Secondary | ICD-10-CM | POA: Diagnosis present

## 2017-08-18 DIAGNOSIS — E785 Hyperlipidemia, unspecified: Secondary | ICD-10-CM | POA: Diagnosis present

## 2017-08-18 DIAGNOSIS — K000 Anodontia: Secondary | ICD-10-CM | POA: Diagnosis present

## 2017-08-18 DIAGNOSIS — K047 Periapical abscess without sinus: Principal | ICD-10-CM

## 2017-08-18 DIAGNOSIS — R739 Hyperglycemia, unspecified: Secondary | ICD-10-CM | POA: Diagnosis present

## 2017-08-18 DIAGNOSIS — R079 Chest pain, unspecified: Secondary | ICD-10-CM

## 2017-08-18 DIAGNOSIS — Z951 Presence of aortocoronary bypass graft: Secondary | ICD-10-CM

## 2017-08-18 DIAGNOSIS — K029 Dental caries, unspecified: Secondary | ICD-10-CM | POA: Diagnosis present

## 2017-08-18 DIAGNOSIS — I252 Old myocardial infarction: Secondary | ICD-10-CM

## 2017-08-18 DIAGNOSIS — K0889 Other specified disorders of teeth and supporting structures: Secondary | ICD-10-CM | POA: Diagnosis present

## 2017-08-18 DIAGNOSIS — F17210 Nicotine dependence, cigarettes, uncomplicated: Secondary | ICD-10-CM | POA: Diagnosis present

## 2017-08-18 DIAGNOSIS — Z9119 Patient's noncompliance with other medical treatment and regimen: Secondary | ICD-10-CM

## 2017-08-18 DIAGNOSIS — Z91120 Patient's intentional underdosing of medication regimen due to financial hardship: Secondary | ICD-10-CM

## 2017-08-18 DIAGNOSIS — Z720 Tobacco use: Secondary | ICD-10-CM | POA: Diagnosis present

## 2017-08-18 DIAGNOSIS — R0789 Other chest pain: Secondary | ICD-10-CM | POA: Diagnosis present

## 2017-08-18 DIAGNOSIS — I25119 Atherosclerotic heart disease of native coronary artery with unspecified angina pectoris: Secondary | ICD-10-CM

## 2017-08-18 DIAGNOSIS — F1010 Alcohol abuse, uncomplicated: Secondary | ICD-10-CM | POA: Diagnosis present

## 2017-08-18 DIAGNOSIS — Z886 Allergy status to analgesic agent status: Secondary | ICD-10-CM

## 2017-08-18 DIAGNOSIS — K0381 Cracked tooth: Secondary | ICD-10-CM | POA: Diagnosis present

## 2017-08-18 DIAGNOSIS — Z91199 Patient's noncompliance with other medical treatment and regimen due to unspecified reason: Secondary | ICD-10-CM

## 2017-08-18 DIAGNOSIS — Z955 Presence of coronary angioplasty implant and graft: Secondary | ICD-10-CM

## 2017-08-18 DIAGNOSIS — M264 Malocclusion, unspecified: Secondary | ICD-10-CM | POA: Diagnosis present

## 2017-08-18 DIAGNOSIS — K219 Gastro-esophageal reflux disease without esophagitis: Secondary | ICD-10-CM | POA: Diagnosis present

## 2017-08-18 DIAGNOSIS — K036 Deposits [accretions] on teeth: Secondary | ICD-10-CM | POA: Diagnosis present

## 2017-08-18 DIAGNOSIS — Z91013 Allergy to seafood: Secondary | ICD-10-CM

## 2017-08-18 DIAGNOSIS — Z7982 Long term (current) use of aspirin: Secondary | ICD-10-CM

## 2017-08-18 DIAGNOSIS — K083 Retained dental root: Secondary | ICD-10-CM | POA: Diagnosis present

## 2017-08-18 DIAGNOSIS — I2510 Atherosclerotic heart disease of native coronary artery without angina pectoris: Secondary | ICD-10-CM | POA: Diagnosis present

## 2017-08-18 DIAGNOSIS — K045 Chronic apical periodontitis: Secondary | ICD-10-CM | POA: Diagnosis present

## 2017-08-18 DIAGNOSIS — I10 Essential (primary) hypertension: Secondary | ICD-10-CM | POA: Diagnosis present

## 2017-08-18 DIAGNOSIS — K06020 Generalized gingival recession, unspecified: Secondary | ICD-10-CM | POA: Diagnosis present

## 2017-08-18 LAB — CBC WITH DIFFERENTIAL/PLATELET
BASOS ABS: 0.1 10*3/uL (ref 0.0–0.1)
BASOS PCT: 1 %
EOS ABS: 0.2 10*3/uL (ref 0.0–0.7)
Eosinophils Relative: 2 %
HCT: 50.2 % (ref 39.0–52.0)
Hemoglobin: 17.9 g/dL — ABNORMAL HIGH (ref 13.0–17.0)
LYMPHS ABS: 2.5 10*3/uL (ref 0.7–4.0)
Lymphocytes Relative: 26 %
MCH: 33.9 pg (ref 26.0–34.0)
MCHC: 35.7 g/dL (ref 30.0–36.0)
MCV: 95.1 fL (ref 78.0–100.0)
Monocytes Absolute: 0.8 10*3/uL (ref 0.1–1.0)
Monocytes Relative: 8 %
NEUTROS PCT: 63 %
Neutro Abs: 6.2 10*3/uL (ref 1.7–7.7)
Platelets: 208 10*3/uL (ref 150–400)
RBC: 5.28 MIL/uL (ref 4.22–5.81)
RDW: 13.2 % (ref 11.5–15.5)
WBC: 9.7 10*3/uL (ref 4.0–10.5)

## 2017-08-18 LAB — BASIC METABOLIC PANEL
ANION GAP: 12 (ref 5–15)
BUN: 9 mg/dL (ref 6–20)
CO2: 20 mmol/L — ABNORMAL LOW (ref 22–32)
Calcium: 8.7 mg/dL — ABNORMAL LOW (ref 8.9–10.3)
Chloride: 104 mmol/L (ref 101–111)
Creatinine, Ser: 1 mg/dL (ref 0.61–1.24)
Glucose, Bld: 105 mg/dL — ABNORMAL HIGH (ref 65–99)
POTASSIUM: 4 mmol/L (ref 3.5–5.1)
SODIUM: 136 mmol/L (ref 135–145)

## 2017-08-18 LAB — I-STAT TROPONIN, ED: TROPONIN I, POC: 0 ng/mL (ref 0.00–0.08)

## 2017-08-18 LAB — TROPONIN I
Troponin I: <0.03 ng/mL (ref <0.03)
Troponin I: <0.03 ng/mL (ref <0.03)

## 2017-08-18 LAB — MRSA PCR SCREENING: MRSA by PCR: NEGATIVE

## 2017-08-18 MED ORDER — SODIUM CHLORIDE 0.9 % IV BOLUS
1000.0000 mL | Freq: Once | INTRAVENOUS | Status: AC
  Administered 2017-08-18: 1000 mL via INTRAVENOUS

## 2017-08-18 MED ORDER — OXYCODONE-ACETAMINOPHEN 5-325 MG PO TABS
1.0000 | ORAL_TABLET | ORAL | Status: AC | PRN
  Administered 2017-08-18 (×2): 1 via ORAL
  Filled 2017-08-18 (×2): qty 1

## 2017-08-18 MED ORDER — CLOPIDOGREL BISULFATE 75 MG PO TABS
75.0000 mg | ORAL_TABLET | Freq: Every day | ORAL | Status: DC
Start: 2017-08-18 — End: 2017-08-21
  Administered 2017-08-18 – 2017-08-21 (×4): 75 mg via ORAL
  Filled 2017-08-18 (×4): qty 1

## 2017-08-18 MED ORDER — PENICILLIN V POTASSIUM 250 MG PO TABS
500.0000 mg | ORAL_TABLET | Freq: Four times a day (QID) | ORAL | Status: DC
  Administered 2017-08-18 – 2017-08-19 (×4): 500 mg via ORAL
  Filled 2017-08-18 (×5): qty 2

## 2017-08-18 MED ORDER — MORPHINE SULFATE (PF) 4 MG/ML IV SOLN
4.0000 mg | Freq: Once | INTRAVENOUS | Status: AC
  Administered 2017-08-18: 4 mg via INTRAVENOUS
  Filled 2017-08-18: qty 1

## 2017-08-18 MED ORDER — IBUPROFEN 200 MG PO TABS
200.0000 mg | ORAL_TABLET | Freq: Three times a day (TID) | ORAL | Status: DC | PRN
  Administered 2017-08-18 – 2017-08-19 (×3): 200 mg via ORAL
  Filled 2017-08-18 (×3): qty 1

## 2017-08-18 MED ORDER — FENTANYL CITRATE (PF) 100 MCG/2ML IJ SOLN
50.0000 ug | Freq: Once | INTRAMUSCULAR | Status: AC
  Administered 2017-08-18: 50 ug via INTRAVENOUS
  Filled 2017-08-18: qty 2

## 2017-08-18 MED ORDER — NITROGLYCERIN 2 % TD OINT
1.0000 [in_us] | TOPICAL_OINTMENT | Freq: Three times a day (TID) | TRANSDERMAL | Status: DC
  Administered 2017-08-18 – 2017-08-19 (×4): 1 [in_us] via TOPICAL
  Filled 2017-08-18: qty 30

## 2017-08-18 MED ORDER — ENOXAPARIN SODIUM 40 MG/0.4ML ~~LOC~~ SOLN
40.0000 mg | SUBCUTANEOUS | Status: DC
  Administered 2017-08-18 – 2017-08-20 (×3): 40 mg via SUBCUTANEOUS
  Filled 2017-08-18 (×3): qty 0.4

## 2017-08-18 MED ORDER — ASPIRIN 81 MG PO CHEW
81.0000 mg | CHEWABLE_TABLET | Freq: Every day | ORAL | Status: DC
  Administered 2017-08-19 – 2017-08-21 (×3): 81 mg via ORAL
  Filled 2017-08-18 (×3): qty 1

## 2017-08-18 MED ORDER — NICOTINE 14 MG/24HR TD PT24
14.0000 mg | MEDICATED_PATCH | Freq: Every day | TRANSDERMAL | Status: DC
  Administered 2017-08-18 – 2017-08-21 (×4): 14 mg via TRANSDERMAL
  Filled 2017-08-18 (×4): qty 1

## 2017-08-18 MED ORDER — METOPROLOL TARTRATE 5 MG/5ML IV SOLN: 5.0000 mg | Freq: Once | INTRAVENOUS | Status: AC

## 2017-08-18 MED ORDER — METOPROLOL TARTRATE 5 MG/5ML IV SOLN
INTRAVENOUS | Status: AC
  Administered 2017-08-18: 5 mg
  Filled 2017-08-18: qty 5

## 2017-08-18 MED ORDER — NITROGLYCERIN 0.4 MG SL SUBL
0.4000 mg | SUBLINGUAL_TABLET | SUBLINGUAL | Status: DC | PRN
  Administered 2017-08-18 (×7): 0.4 mg via SUBLINGUAL
  Filled 2017-08-18 (×5): qty 1

## 2017-08-18 MED ORDER — MORPHINE SULFATE (PF) 2 MG/ML IV SOLN
2.0000 mg | INTRAVENOUS | Status: DC | PRN
  Administered 2017-08-18 – 2017-08-21 (×16): 2 mg via INTRAVENOUS
  Filled 2017-08-18 (×16): qty 1

## 2017-08-18 MED ORDER — ROSUVASTATIN CALCIUM 10 MG PO TABS
10.0000 mg | ORAL_TABLET | Freq: Every day | ORAL | Status: DC
  Administered 2017-08-18 – 2017-08-20 (×3): 10 mg via ORAL
  Filled 2017-08-18 (×3): qty 1

## 2017-08-18 MED ORDER — ASPIRIN 81 MG PO CHEW
324.0000 mg | CHEWABLE_TABLET | Freq: Once | ORAL | Status: AC
  Administered 2017-08-18: 324 mg via ORAL
  Filled 2017-08-18: qty 4

## 2017-08-18 MED ORDER — METOPROLOL TARTRATE 25 MG PO TABS
25.0000 mg | ORAL_TABLET | Freq: Two times a day (BID) | ORAL | Status: DC
Start: 2017-08-18 — End: 2017-08-21
  Administered 2017-08-18 – 2017-08-21 (×7): 25 mg via ORAL
  Filled 2017-08-18 (×7): qty 1

## 2017-08-18 NOTE — ED Provider Notes (Signed)
[facility]-EMERGENCY DEPT Provider Note   CSN: [id number] Arrival date & time: 08/18/17  0228     History   Chief Complaint Chief Complaint  Patient presents with  . Chest Pain  . Dental Pain    HPI Douglas [name] is a 52 y.o. male with PMH/o CARD, CABG X 5, GERD, NSTEMI who presents for evaluation of 3 days of constant left-sided chest pain.  He describes it as sharp in nature.  He states that it is worse with exertion and worse with deep inspiration.  He has had some associated diaphoresis, nausea/vomiting.  He states that it feels like when he previously had a heart attack.  Patient reports he has not taken any medications for the symptoms.  He states that it is not worsened by laying supine or sitting up.  Patient reports he is followed by cardiology (Dr. Donnie Aho).  Patient also reports dental pain that is been ongoing for the last several weeks.  Patient reports he does not see a dentist.  He states he has not taken anything for the pain.  He reports pain to the left lower gum and tooth where the tooth is cracked.  Patient states he is still able to tolerate secretions and p.o. without any difficulty.  Denies any facial swelling.  Patient with no fevers.  Patient is a current smoker.  Denies any IV drug use, cocaine use.  Patient has a history of hypertension, hyperlipidemia. He reports intermittently taking his medications. He denies recent immobilization, prior history of DVT/PE, recent surgery, leg swelling, or long travel.   The history is provided by the patient.    Past Medical History:  Diagnosis Date  . Anginal pain (HCC)   . Coronary artery disease   . GERD (gastroesophageal reflux disease)   . History of acute pancreatitis 04/06/2015  . NSTEMI (non-ST elevated myocardial infarction) (HCC) 01/2014  . Pancreatitis   . PTSD (post-traumatic stress disorder)   . Tobacco abuse     Patient Active Problem List   Diagnosis Date Noted  . Chest pain  08/18/2017  . Aortic atherosclerosis (HCC) 04/10/2017  . Patient's noncompliance with other medical treatment and regimen 04/10/2017  . CAD (coronary artery disease), native coronary artery 03/15/2016  . Pancreatic divisum 04/18/2015  . History of acute pancreatitis 04/06/2015  . Alcohol abuse 03/09/2015  . Fatty liver 03/09/2015  . Tobacco abuse     Past Surgical History:  Procedure Laterality Date  . CARDIAC CATHETERIZATION N/A 03/14/2016   Procedure: Left Heart Cath and Coronary Angiography;  Surgeon: Kathleene Hazel, MD;  Location: [facility] INVASIVE CV LAB;  Service: Cardiovascular;  Laterality: N/A;  . CORONARY ANGIOPLASTY WITH STENT PLACEMENT  01/18/2014   "1"  . CORONARY ARTERY BYPASS GRAFT N/A 03/19/2016   Procedure: CORONARY ARTERY BYPASS GRAFTING (CABG), ON PUMP, TIMES FIVE, USING LEFT INTERNAL MAMMARY ARTERY AND RIGHT GREATER SAPHENOUS VEIN HARVESTED ENDOSCOPICALLY;  Surgeon: Delight Ovens, MD;  Location: [facility] OR;  Service: Open Heart Surgery;  Laterality: N/A;  LIMA-LAD SVG-OM SVG-DIAG SEQ SVG-PD-PL  . CORONARY BALLOON ANGIOPLASTY N/A 04/11/2017   Procedure: CORONARY BALLOON ANGIOPLASTY;  Surgeon: Yvonne Kendall, MD;  Location: [facility] INVASIVE CV LAB;  Service: Cardiovascular;  Laterality: N/A;  Distal RCA  . INTRAVASCULAR PRESSURE WIRE/FFR STUDY N/A 04/11/2017   Procedure: INTRAVASCULAR PRESSURE WIRE/FFR STUDY;  Surgeon: Yvonne Kendall, MD;  Location: [facility] INVASIVE CV LAB;  Service: Cardiovascular;  Laterality: N/A;  . LEFT HEART CATH AND CORS/GRAFTS ANGIOGRAPHY N/A 04/11/2017  Procedure: LEFT HEART CATH AND CORS/GRAFTS ANGIOGRAPHY;  Surgeon: Yvonne Kendall, MD;  Location: [facility] INVASIVE CV LAB;  Service: Cardiovascular;  Laterality: N/A;  . LEFT HEART CATHETERIZATION WITH CORONARY ANGIOGRAM N/A 01/18/2014   Procedure: LEFT HEART CATHETERIZATION WITH CORONARY ANGIOGRAM;  Surgeon: Runell Gess, MD;  Location: [facility] CATH LAB;  Service: Cardiovascular;  Laterality: N/A;  . TEE  WITHOUT CARDIOVERSION N/A 03/19/2016   Procedure: TRANSESOPHAGEAL ECHOCARDIOGRAM (TEE);  Surgeon: Delight Ovens, MD;  Location: [facility] OR;  Service: Open Heart Surgery;  Laterality: N/A;        Home Medications    Prior to Admission medications   Medication Sig Start Date End Date Taking? Authorizing Provider  acetaminophen (TYLENOL) 325 MG tablet Take 2 tablets (650 mg total) by mouth every 4 (four) hours as needed for headache or mild pain. 04/12/17  Yes Lonia Blood, MD  aspirin 81 MG chewable tablet Chew 1 tablet (81 mg total) by mouth daily. 04/13/17  Yes Lonia Blood, MD  Aspirin-Salicylamide-Caffeine (BC HEADACHE POWDER PO) Take 1 Package by mouth every 6 (six) hours as needed (pain).   Yes [provider]  nicotine (NICODERM CQ - DOSED IN MG/24 HOURS) 21 mg/24hr patch Place 21 mg onto the skin daily.   Yes [provider]  clopidogrel (PLAVIX) 75 MG tablet Take 1 tablet (75 mg total) by mouth daily with breakfast. Patient not taking: Reported on 08/18/2017 04/13/17   Lonia Blood, MD  metoprolol tartrate (LOPRESSOR) 25 MG tablet Take 1 tablet (25 mg total) by mouth 2 (two) times daily. Patient not taking: Reported on 08/18/2017 04/12/17   Lonia Blood, MD  nitroGLYCERIN (NITROSTAT) 0.4 MG SL tablet Place 1 tablet (0.4 mg total) under the tongue every 5 (five) minutes as needed for chest pain. Patient not taking: Reported on 08/18/2017 04/12/17   Lonia Blood, MD  penicillin v potassium (VEETID) 500 MG tablet Take 1 tablet (500 mg total) by mouth 4 (four) times daily. Patient not taking: Reported on 08/18/2017 05/08/17   Roxy Horseman, PA-C  rosuvastatin (CRESTOR) 10 MG tablet Take 1 tablet (10 mg total) by mouth daily at 6 PM. Patient not taking: Reported on 08/18/2017 04/12/17   Lonia Blood, MD    Family History Family History  Problem Relation Age of Onset  . Cancer Mother        Lung cancer  . Coronary artery disease Mother   . Heart  failure Maternal Grandmother     Social History Social History   Tobacco Use  . Smoking status: Current Every Day Smoker    Packs/day: 1.00    Years: 31.00    Pack years: 31.00    Types: Cigarettes  . Smokeless tobacco: Never Used  Substance Use Topics  . Alcohol use: Yes    Alcohol/week: 0.6 oz    Types: 1 Cans of beer per week    Comment: occ  . Drug use: No    Types: Marijuana    Comment: denies 06/17/2015     Allergies   Ibuprofen and Shellfish allergy   Review of Systems Review of Systems  Constitutional: Positive for diaphoresis. Negative for fever.  HENT: Positive for dental problem. Negative for drooling, facial swelling and trouble swallowing.   Respiratory: Negative for cough and shortness of breath.   Cardiovascular: Positive for chest pain.  Gastrointestinal: Positive for nausea and vomiting. Negative for abdominal pain.  Genitourinary: Negative for dysuria and hematuria.  Neurological: Negative for headaches.  All other systems reviewed and are negative.    Physical Exam Updated Vital Signs BP 109/78   Pulse 80   Temp (!) 97.5 F (36.4 C) (Oral)   Resp 14   Ht 5\' 11"  (1.803 m)   Wt 70.3 kg (155 lb)   SpO2 96%   BMI 21.62 kg/m   Physical Exam  Constitutional: He is oriented to person, place, and time. He appears well-developed and well-nourished.  HENT:  Head: Normocephalic and atraumatic.  Mouth/Throat: Oropharynx is clear and moist and mucous membranes are normal.    Airway patent, phonation intact.  No oral angioedema.  Diffusely scattered dental caries throughout mouth.  Missing teeth through both upper and lower gums.  No trismus.  Uvula is midline.  Eyes: Pupils are equal, round, and reactive to light. Conjunctivae, EOM and lids are normal.  Neck: Full passive range of motion without pain.  Cardiovascular: Normal rate, regular rhythm, normal heart sounds and normal pulses. Exam reveals no gallop and no friction rub.  No murmur  heard. Pulses:      Radial pulses are 2+ on the right side, and 2+ on the left side.       Dorsalis pedis pulses are 2+ on the right side, and 2+ on the left side.  Pulmonary/Chest: Effort normal and breath sounds normal.  Lungs clear to auscultation bilaterally.  Symmetric chest rise.  No wheezing, rales, rhonchi. Pain reproduced with palpation. No deformity or crepitus noted. Cannot reproduce pain with movement of extremities.   Abdominal: Soft. Normal appearance. There is no tenderness. There is no rigidity and no guarding.  Musculoskeletal: Normal range of motion.  Neurological: He is alert and oriented to person, place, and time.  Skin: Skin is warm and dry. Capillary refill takes less than 2 seconds.  Psychiatric: He has a normal mood and affect. His speech is normal.  Nursing note and vitals reviewed.    ED Treatments / Results  Labs (all labs ordered are listed, but only abnormal results are displayed) Labs Reviewed  CBC WITH DIFFERENTIAL/PLATELET - Abnormal; Notable for the following components:      Result Value   Hemoglobin 17.9 (*)    All other components within normal limits  BASIC METABOLIC PANEL - Abnormal; Notable for the following components:   CO2 20 (*)    Glucose, Bld 105 (*)    Calcium 8.7 (*)    All other components within normal limits  TROPONIN I  TROPONIN I  TROPONIN I  I-STAT TROPONIN, ED    EKG EKG Interpretation  Date/Time:  Sunday August 18 2017 02:51:42 EDT Ventricular Rate:  102 PR Interval:    QRS Duration: 80 QT Interval:  374 QTC Calculation: 488 R Axis:   78 Text Interpretation:  Sinus tachycardia Probable left atrial enlargement Nonspecific T abnormalities, lateral leads Borderline prolonged QT interval Confirmed by Benjiman Core [phone]) on 08/18/2017 4:14:25 AM   Radiology Dg Chest 2 View  Result Date: 08/18/2017 CLINICAL DATA:  Acute onset of mid chest pain and shortness of breath. EXAM: CHEST - 2 VIEW COMPARISON:  Chest  radiograph performed 04/09/2017 FINDINGS: The lungs are well-aerated. Calcified granulomata are noted at the right lung. There is no evidence of focal opacification, pleural effusion or pneumothorax. The heart is normal in size; the patient is status post median sternotomy, with evidence of prior CABG. No acute osseous abnormalities are seen. IMPRESSION: No acute cardiopulmonary process seen. Electronically Signed   By: Roanna Raider M.D.   On:  08/18/2017 04:46    Procedures Procedures (including critical care time)  Medications Ordered in ED Medications  oxyCODONE-acetaminophen (PERCOCET/ROXICET) 5-325 MG per tablet 1 tablet (1 tablet Oral Given 08/18/17 0301)  nitroGLYCERIN (NITROSTAT) SL tablet 0.4 mg (0.4 mg Sublingual Given 08/18/17 0541)  sodium chloride 0.9 % bolus 1,000 mL (0 mLs Intravenous Stopped 08/18/17 0523)  morphine 4 MG/ML injection 4 mg (4 mg Intravenous Given 08/18/17 0424)  aspirin chewable tablet 324 mg (324 mg Oral Given 08/18/17 0425)     Initial Impression / Assessment and Plan / ED Course  I have reviewed the triage vital signs and the nursing notes.  Pertinent labs & imaging results that were available during my care of the patient were reviewed by me and considered in my medical decision making (see chart for details).     52 year old male who presents for evaluation of chest pain that is been ongoing x3 days.  Reports that sharp in nature.  Worse with exertion worse with deep inspiration.  Associated with diaphoresis, nausea/vomiting.  Also reports intermittent dental pain.  Has a history of an STEMI and CABG x5.  Currently seen by Dr. Donnie Ahoilley with cardiology.  Reports noncompliance with medications. Patient is afebrile, non-toxic appearing, sitting comfortably on examination table. Vital signs reviewed and stable.  Equal pulses in all 4 extremities.  Concern for ACS etiology versus infectious etiology.  History/physical exam is not concerning for aortic dissection.  Do not  suspect PE given history/physical exam.  Additionally, patient with diffuse dental caries.  No identifiable dental abscess noted.  History/physical exam is not concerning for Ludwig angina or peritonsillar abscess.  Initial labs ordered at triage.  Morphine, nitro, aspirin given in the ED.  BMP shows bicarb of 20.  Otherwise unremarkable.  I-STAT troponin negative.  CBC without any significant leukocytosis.  Hemoglobin is 17.9.  As note, patient has been admitted in January 2018 for chest pain rule out.  At that time had a catheterization that showed severe native coronary artery disease including 90% proximal LAD, 80 to 90% axial left circumflex with diffuse disease and 70% mid RCA.  Patent LIMA-LAD with 30% ostial stenosis.  Otherwise okay.  Given patient's presentation, risk factors, he has a heart score of 6.  Given concerns, will plan for cardiac consult with possible admission.  Consult to Dr. Donnie Ahoilley (Cardiology) placed.   Re-Evaluation.  He reports his chest pain has improved to 6/10 after first round of nitro and morphine.  Still having dental pain.  Discussed with Yehuda SavannahJedrek Wosik (Cardiology).  Suspect that his pain is worse with inspiration there is some reproducible pain with palpation, lower suspicion for cardiac disease but agrees that given his risk factors will need to come in for cardiac rule out.  Recommends medicine admit for chest pain rule out with plans to consult in the AM.   Discussed patient with Dr. Selena BattenKim (hospitalist). Will plan to admit patient.    Final Clinical Impressions(s) / ED Diagnoses   Final diagnoses:  Chest pain, unspecified type    ED Discharge Orders    None       Maxwell CaulLayden, Kimoni Pagliarulo A, PA-C 08/18/17 [id number]    Benjiman CorePickering, Nathan, MD 08/18/17 [phone]

## 2017-08-18 NOTE — ED Notes (Signed)
Report attempted was told nurse on floor would call back for report.

## 2017-08-18 NOTE — H&P (Signed)
History and Physical    Douglas [name] WUJ:[id number] DOB: 02-Feb-1966 DOA: 08/18/2017  PCP: Patient, No Pcp Per   Patient coming from: Home    Chief Complaint: Chest pain  HPI: Douglas [name] is a 52 y.o. male with medical history significant of coronary artery disease, status post stents to right coronary artery, multivessel disease status post CABG in 2018, balloon angioplasty who presented to the emergency department with complaints of right-sided chest pain.  Patient's chest pain was atypical.  He said the pain was present at rest.  Patient was sharp, shooting in nature and not radiating anywhere.  Patient is also having bothering toothache and has severe dental caries.  Patient says the chest pain became worse  after he developed dental pain.  Both chest pain and dental pain started yesterday.  He said that the chest pain was similar to the last angina episodes.  Chest pain did not improve with sublingual nitroglycerin.  And says he also was not taking his home medications because he could not afford it. Patient seen and examined at the bedside.  He was not in acute distress.  He says he still has right-sided chest pain and rates it as 3/10.  Patient denies any shortness of breath, palpitations, fever, chills, cough, nausea, vomiting, diarrhea or headache. He has history of alcohol and tobacco abuse.  He says he has not smoked currently and using nicotine patch.  ED Course: [facility] hospitalist called for admission for chest pain rule out.  Cardiology consulted.  Review of Systems: As per HPI otherwise 10 point review of systems negative.    Past Medical History:  Diagnosis Date  . Anginal pain (HCC)   . Coronary artery disease   . GERD (gastroesophageal reflux disease)   . History of acute pancreatitis 04/06/2015  . NSTEMI (non-ST elevated myocardial infarction) (HCC) 01/2014  . Pancreatitis   . PTSD (post-traumatic stress disorder)   . Tobacco abuse     Past Surgical History:    Procedure Laterality Date  . CARDIAC CATHETERIZATION N/A 03/14/2016   Procedure: Left Heart Cath and Coronary Angiography;  Surgeon: Kathleene Hazel, MD;  Location: [facility] INVASIVE CV LAB;  Service: Cardiovascular;  Laterality: N/A;  . CORONARY ANGIOPLASTY WITH STENT PLACEMENT  01/18/2014   "1"  . CORONARY ARTERY BYPASS GRAFT N/A 03/19/2016   Procedure: CORONARY ARTERY BYPASS GRAFTING (CABG), ON PUMP, TIMES FIVE, USING LEFT INTERNAL MAMMARY ARTERY AND RIGHT GREATER SAPHENOUS VEIN HARVESTED ENDOSCOPICALLY;  Surgeon: Delight Ovens, MD;  Location: [facility] OR;  Service: Open Heart Surgery;  Laterality: N/A;  LIMA-LAD SVG-OM SVG-DIAG SEQ SVG-PD-PL  . CORONARY BALLOON ANGIOPLASTY N/A 04/11/2017   Procedure: CORONARY BALLOON ANGIOPLASTY;  Surgeon: Yvonne Kendall, MD;  Location: [facility] INVASIVE CV LAB;  Service: Cardiovascular;  Laterality: N/A;  Distal RCA  . INTRAVASCULAR PRESSURE WIRE/FFR STUDY N/A 04/11/2017   Procedure: INTRAVASCULAR PRESSURE WIRE/FFR STUDY;  Surgeon: Yvonne Kendall, MD;  Location: [facility] INVASIVE CV LAB;  Service: Cardiovascular;  Laterality: N/A;  . LEFT HEART CATH AND CORS/GRAFTS ANGIOGRAPHY N/A 04/11/2017   Procedure: LEFT HEART CATH AND CORS/GRAFTS ANGIOGRAPHY;  Surgeon: Yvonne Kendall, MD;  Location: [facility] INVASIVE CV LAB;  Service: Cardiovascular;  Laterality: N/A;  . LEFT HEART CATHETERIZATION WITH CORONARY ANGIOGRAM N/A 01/18/2014   Procedure: LEFT HEART CATHETERIZATION WITH CORONARY ANGIOGRAM;  Surgeon: Runell Gess, MD;  Location: [facility] CATH LAB;  Service: Cardiovascular;  Laterality: N/A;  . TEE WITHOUT CARDIOVERSION N/A 03/19/2016   Procedure: TRANSESOPHAGEAL ECHOCARDIOGRAM (TEE);  Surgeon: Delight Ovens,  MD;  Location: [facility] OR;  Service: Open Heart Surgery;  Laterality: N/A;     reports that he has been smoking cigarettes.  He has a 31.00 pack-year smoking history. He has never used smokeless tobacco. He reports that he drinks about 0.6 oz of alcohol per week. He reports that  he does not use drugs.  Allergies  Allergen Reactions  . Ibuprofen Nausea And Vomiting    UPSETS STOMACH  . Shellfish Allergy Nausea And Vomiting    Family History  Problem Relation Age of Onset  . Cancer Mother        Lung cancer  . Coronary artery disease Mother   . Heart failure Maternal Grandmother      Prior to Admission medications   Medication Sig Start Date End Date Taking? Authorizing Provider  acetaminophen (TYLENOL) 325 MG tablet Take 2 tablets (650 mg total) by mouth every 4 (four) hours as needed for headache or mild pain. 04/12/17  Yes Lonia Blood, MD  aspirin 81 MG chewable tablet Chew 1 tablet (81 mg total) by mouth daily. 04/13/17  Yes Lonia Blood, MD  Aspirin-Salicylamide-Caffeine (BC HEADACHE POWDER PO) Take 1 Package by mouth every 6 (six) hours as needed (pain).   Yes [provider]  nicotine (NICODERM CQ - DOSED IN MG/24 HOURS) 21 mg/24hr patch Place 21 mg onto the skin daily.   Yes [provider]  clopidogrel (PLAVIX) 75 MG tablet Take 1 tablet (75 mg total) by mouth daily with breakfast. Patient not taking: Reported on 08/18/2017 04/13/17   Lonia Blood, MD  metoprolol tartrate (LOPRESSOR) 25 MG tablet Take 1 tablet (25 mg total) by mouth 2 (two) times daily. Patient not taking: Reported on 08/18/2017 04/12/17   Lonia Blood, MD  nitroGLYCERIN (NITROSTAT) 0.4 MG SL tablet Place 1 tablet (0.4 mg total) under the tongue every 5 (five) minutes as needed for chest pain. Patient not taking: Reported on 08/18/2017 04/12/17   Lonia Blood, MD  penicillin v potassium (VEETID) 500 MG tablet Take 1 tablet (500 mg total) by mouth 4 (four) times daily. Patient not taking: Reported on 08/18/2017 05/08/17   Roxy Horseman, PA-C  rosuvastatin (CRESTOR) 10 MG tablet Take 1 tablet (10 mg total) by mouth daily at 6 PM. Patient not taking: Reported on 08/18/2017 04/12/17   Lonia Blood, MD    Physical Exam: Vitals:   08/18/17 0636  08/18/17 0700 08/18/17 0800 08/18/17 0830  BP: (!) 133/91 (!) 149/101 (!) 167/104 (!) 132/95  Pulse: 82 81 84 84  Resp: 11 13 15  (!) 22  Temp:   98 F (36.7 C)   TempSrc:   Oral   SpO2: 98% 97% 97% 96%  Weight:   69.1 kg (152 lb 5.4 oz)   Height:   5\' 11"  (1.803 m)     Constitutional: NAD, calm, comfortable Vitals:   08/18/17 0636 08/18/17 0700 08/18/17 0800 08/18/17 0830  BP: (!) 133/91 (!) 149/101 (!) 167/104 (!) 132/95  Pulse: 82 81 84 84  Resp: 11 13 15  (!) 22  Temp:   98 F (36.7 C)   TempSrc:   Oral   SpO2: 98% 97% 97% 96%  Weight:   69.1 kg (152 lb 5.4 oz)   Height:   5\' 11"  (1.803 m)    Eyes: PERRL, lids and conjunctivae normal ENMT: Mucous membranes are moist. Posterior pharynx clear of any exudate or lesions.Normal dentition.  Neck: normal, supple, no masses, no thyromegaly Respiratory: clear  to auscultation bilaterally, no wheezing, no crackles. Normal respiratory effort. No accessory muscle use.  Cardiovascular: Regular rate and rhythm, no murmurs / rubs / gallops. No extremity edema. 2+ pedal pulses. No carotid bruits.  Abdomen: no tenderness, no masses palpated. No hepatosplenomegaly. Bowel sounds positive.  Musculoskeletal: no clubbing / cyanosis. No joint deformity upper and lower extremities. Good ROM, no contractures. Normal muscle tone.  Skin: no rashes, lesions, ulcers. No induration Neurologic: CN 2-12 grossly intact. Sensation intact, DTR normal. Strength 5/5 in all 4.  Psychiatric: Normal judgment and insight. Alert and oriented x 3. Normal mood.   Foley Catheter:None  Labs on Admission: I have personally reviewed following labs and imaging studies  CBC: Recent Labs  Lab 08/18/17 0304  WBC 9.7  NEUTROABS 6.2  HGB 17.9*  HCT 50.2  MCV 95.1  PLT 208   Basic Metabolic Panel: Recent Labs  Lab 08/18/17 0304  NA 136  K 4.0  CL 104  CO2 20*  GLUCOSE 105*  BUN 9  CREATININE 1.00  CALCIUM 8.7*   GFR: Estimated Creatinine Clearance: 86.4  mL/min (by C-G formula based on SCr of 1 mg/dL). Liver Function Tests: No results for input(s): AST, ALT, ALKPHOS, BILITOT, PROT, ALBUMIN in the last 168 hours. No results for input(s): LIPASE, AMYLASE in the last 168 hours. No results for input(s): AMMONIA in the last 168 hours. Coagulation Profile: No results for input(s): INR, PROTIME in the last 168 hours. Cardiac Enzymes: Recent Labs  Lab 08/18/17 0758  TROPONINI <0.03   BNP (last 3 results) No results for input(s): PROBNP in the last 8760 hours. HbA1C: No results for input(s): HGBA1C in the last 72 hours. CBG: No results for input(s): GLUCAP in the last 168 hours. Lipid Profile: No results for input(s): CHOL, HDL, LDLCALC, TRIG, CHOLHDL, LDLDIRECT in the last 72 hours. Thyroid Function Tests: No results for input(s): TSH, T4TOTAL, FREET4, T3FREE, THYROIDAB in the last 72 hours. Anemia Panel: No results for input(s): VITAMINB12, FOLATE, FERRITIN, TIBC, IRON, RETICCTPCT in the last 72 hours. Urine analysis:    Component Value Date/Time   COLORURINE YELLOW 01/29/2016 0051   APPEARANCEUR CLEAR 01/29/2016 0051   LABSPEC 1.016 01/29/2016 0051   PHURINE 5.5 01/29/2016 0051   GLUCOSEU NEGATIVE 01/29/2016 0051   HGBUR NEGATIVE 01/29/2016 0051   BILIRUBINUR NEGATIVE 01/29/2016 0051   KETONESUR NEGATIVE 01/29/2016 0051   PROTEINUR NEGATIVE 01/29/2016 0051   UROBILINOGEN 1.0 01/15/2013 1135   NITRITE NEGATIVE 01/29/2016 0051   LEUKOCYTESUR NEGATIVE 01/29/2016 0051    Radiological Exams on Admission: Dg Chest 2 View  Result Date: 08/18/2017 CLINICAL DATA:  Acute onset of mid chest pain and shortness of breath. EXAM: CHEST - 2 VIEW COMPARISON:  Chest radiograph performed 04/09/2017 FINDINGS: The lungs are well-aerated. Calcified granulomata are noted at the right lung. There is no evidence of focal opacification, pleural effusion or pneumothorax. The heart is normal in size; the patient is status post median sternotomy, with  evidence of prior CABG. No acute osseous abnormalities are seen. IMPRESSION: No acute cardiopulmonary process seen. Electronically Signed   By: Roanna Raider M.D.   On: 08/18/2017 04:46     Assessment/Plan Active Problems:   Chest pain  Chest pain: Looks atypical.  Complains right sided chest pain.Present at rest.  Currently chest pain has improved somewhat but is still there and rates 3/10. Cardiology consulted. Initial troponin negative.  We will continue to cycle the troponin.  EKG without any ischemic changes.  Coronary artery disease: History  of extensive coronary artery disease.  Status post stents, CABG, balloon angioplasty.  Follows with cardiology as an outpatient. Continue aspirin and Plavix, statin.  Tooth ache: Looks like he has a dental abscess.  He is complaining of pain on left lower tooth.  We will start on penicillin, pain management.  He has extensive dental caries.  He needs to follow-up with dentist as an outpatient.  History of peripheral vascular disease: History of significant right common femoral artery disease.  Continue antiplatelet therapy and statin.  Follow-up with vascular surgery as an outpatient.  History of alcohol/smoking: Counseled for cessation for both.  He has history of alcoholic pancolitis.  Hypertension: Noted to be hypertensive on presentation.  Will resume his home medications.  Continue to monitor his blood pressure.  Noncompliance: Counseled for importance of compliance on his medication.  Hyperlipidemia: Continue statin.   Severity of Illness: The appropriate patient status for this patient is OBSERVATION.    DVT prophylaxis: Lovenox Code Status: Full Family Communication: None present at the bedside Consults called: Cardiology     Burnadette PopAmrit Dhanush Jokerst MD [facility] Pager [phone]  If 7PM-7AM, please contact night-coverage [email or web address] Password [email or web address]  08/18/2017, 9:11 AM

## 2017-08-18 NOTE — Progress Notes (Signed)
Patient to transfer to 1440 report given to receiving nurse Morrie SheldonAshley, all questions answered at this time.  Pt. VSS with no s/s of distress noted.  Patient stable for transfer.

## 2017-08-18 NOTE — Consult Note (Signed)
Cardiology Consultation:   Patient ID: Douglas [name]; [id number]; 04-24-1965   Admit date: 08/18/2017 Date of Consult: 08/18/2017  Primary Care Provider: Patient, No Pcp Per Primary Cardiologist:  Georga Hacking, MD Primary Electrophysiologist:  n/a   Patient Profile:   Douglas [name] is a 52 y.o. male with a hx of CAD who is being seen today for the evaluation of chest pain at the request of Dr. Renford Dills.  History of Present Illness:   Douglas [name] has a long-standing history of coronary artery disease.  He has a history of 2015 stent to the right coronary artery (Dr.  Allyson [name], 3.5 mm x 18 mm Xience), but then underwent multivessel bypass surgery in January 2018 (Dr. Tyrone [name], LIMA to LAD, SVG to OM, SVG to diagonal, sequential SVG to PDA and PLA), subsequently returned January 2019 and underwent plain balloon angioplasty for in-stent restenosis in the right coronary artery (Dr. Okey [name]).  He has preserved left ventricular systolic function, despite an inferior wall motion abnormality.  He has not had problems with cardiac arrhythmia or congestive heart failure in the past.  He has had severe tooth pain due to a suspected tooth abscess for several days.  The pain has been gradually worsening and he subsequently developed constant chest pressure reminiscent of his previous angina.  This improved but did not resolve with sublingual nitroglycerin.  His blood pressure has been very elevated.  He has been out of all his cardiac medications due to lack of money for at least a month.  He reports that he has stopped smoking but is still using nicotine patches.  He denies abdominal pain, nausea and vomiting, palpitations, syncope, fever and chills, cough, hemoptysis, leg swelling, intermittent claudication, focal neurological complaints.  Infrequent noncardiac problems include a history of alcohol abuse with recurrent acute pancreatitis, PTSD, gastroesophageal reflux disease and recurrent social  problems including homelessness.  He currently does have a place to live.  Past Medical History:  Diagnosis Date  . Anginal pain (HCC)   . Coronary artery disease   . GERD (gastroesophageal reflux disease)   . History of acute pancreatitis 04/06/2015  . NSTEMI (non-ST elevated myocardial infarction) (HCC) 01/2014  . Pancreatitis   . PTSD (post-traumatic stress disorder)   . Tobacco abuse     Past Surgical History:  Procedure Laterality Date  . CARDIAC CATHETERIZATION N/A 03/14/2016   Procedure: Left Heart Cath and Coronary Angiography;  Surgeon: Douglas Hazel, MD;  Location[facility] INVASIVE CV LAB;  Service: Cardiovascular;  Laterality: N/A;  . CORONARY ANGIOPLASTY WITH STENT PLACEMENT  01/18/2014   "1"  . CORONARY ARTERY BYPASS GRAFT N/A 03/19/2016   Procedure: CORONARY ARTERY BYPASS GRAFTING (CABG), ON PUMP, TIMES FIVE, USING LEFT INTERNAL MAMMARY ARTERY AND RIGHT GREATER SAPHENOUS VEIN HARVESTED ENDOSCOPICALLY;  Surgeon: Douglas Ovens, MD;  Location: [facility] OR;  Service: Open Heart Surgery;  Laterality: N/A;  LIMA-LAD SVG-OM SVG-DIAG SEQ SVG-PD-PL  . CORONARY BALLOON ANGIOPLASTY N/A 04/11/2017   Procedure: CORONARY BALLOON ANGIOPLASTY;  Surgeon: Douglas Kendall, MD;  Location: [facility] INVASIVE CV LAB;  Service: Cardiovascular;  Laterality: N/A;  Distal RCA  . INTRAVASCULAR PRESSURE WIRE/FFR STUDY N/A 04/11/2017   Procedure: INTRAVASCULAR PRESSURE WIRE/FFR STUDY;  Surgeon: Douglas Kendall, MD;  Location: [facility] INVASIVE CV LAB;  Service: Cardiovascular;  Laterality: N/A;  . LEFT HEART CATH AND CORS/GRAFTS ANGIOGRAPHY N/A 04/11/2017   Procedure: LEFT HEART CATH AND CORS/GRAFTS ANGIOGRAPHY;  Surgeon: Douglas Kendall, MD;  Location: [facility] INVASIVE CV LAB;  Service: Cardiovascular;  Laterality:  N/A;  . LEFT HEART CATHETERIZATION WITH CORONARY ANGIOGRAM N/A 01/18/2014   Procedure: LEFT HEART CATHETERIZATION WITH CORONARY ANGIOGRAM;  Surgeon: Douglas Gess, MD;  Location: [facility] CATH LAB;  Service:  Cardiovascular;  Laterality: N/A;  . TEE WITHOUT CARDIOVERSION N/A 03/19/2016   Procedure: TRANSESOPHAGEAL ECHOCARDIOGRAM (TEE);  Surgeon: Douglas Ovens, MD;  Location: [facility] OR;  Service: Open Heart Surgery;  Laterality: N/A;     Home Medications:  Prior to Admission medications   Medication Sig Start Date End Date Taking? Authorizing Provider  acetaminophen (TYLENOL) 325 MG tablet Take 2 tablets (650 mg total) by mouth every 4 (four) hours as needed for headache or mild pain. 04/12/17  Yes Lonia Blood, MD  aspirin 81 MG chewable tablet Chew 1 tablet (81 mg total) by mouth daily. 04/13/17  Yes Lonia Blood, MD  Aspirin-Salicylamide-Caffeine (BC HEADACHE POWDER PO) Take 1 Package by mouth every 6 (six) hours as needed (pain).   Yes [provider]  nicotine (NICODERM CQ - DOSED IN MG/24 HOURS) 21 mg/24hr patch Place 21 mg onto the skin daily.   Yes [provider]  clopidogrel (PLAVIX) 75 MG tablet Take 1 tablet (75 mg total) by mouth daily with breakfast. Patient not taking: Reported on 08/18/2017 04/13/17   Lonia Blood, MD  metoprolol tartrate (LOPRESSOR) 25 MG tablet Take 1 tablet (25 mg total) by mouth 2 (two) times daily. Patient not taking: Reported on 08/18/2017 04/12/17   Lonia Blood, MD  nitroGLYCERIN (NITROSTAT) 0.4 MG SL tablet Place 1 tablet (0.4 mg total) under the tongue every 5 (five) minutes as needed for chest pain. Patient not taking: Reported on 08/18/2017 04/12/17   Lonia Blood, MD  penicillin v potassium (VEETID) 500 MG tablet Take 1 tablet (500 mg total) by mouth 4 (four) times daily. Patient not taking: Reported on 08/18/2017 05/08/17   Douglas Horseman, PA-C  rosuvastatin (CRESTOR) 10 MG tablet Take 1 tablet (10 mg total) by mouth daily at 6 PM. Patient not taking: Reported on 08/18/2017 04/12/17   Lonia Blood, MD    Inpatient Medications: Scheduled Meds:  Continuous Infusions:  PRN Meds: nitroGLYCERIN  Allergies:      Allergies  Allergen Reactions  . Ibuprofen Nausea And Vomiting    UPSETS STOMACH  . Shellfish Allergy Nausea And Vomiting    Social History:   Social History   Socioeconomic History  . Marital status: Divorced    Spouse name: Not on file  . Number of children: 2  . Years of education: Not on file  . Highest education level: Not on file  Occupational History  . Occupation: Statistician  Social Needs  . Financial resource strain: Not on file  . Food insecurity:    Worry: Not on file    Inability: Not on file  . Transportation needs:    Medical: Not on file    Non-medical: Not on file  Tobacco Use  . Smoking status: Current Every Day Smoker    Packs/day: 1.00    Years: 31.00    Pack years: 31.00    Types: Cigarettes  . Smokeless tobacco: Never Used  Substance and Sexual Activity  . Alcohol use: Yes    Alcohol/week: 0.6 oz    Types: 1 Cans of beer per week    Comment: occ  . Drug use: No    Types: Marijuana    Comment: denies 06/17/2015  . Sexual activity: Not Currently  Lifestyle  . Physical activity:  Days per week: Not on file    Minutes per session: Not on file  . Stress: Not on file  Relationships  . Social connections:    Talks on phone: Not on file    Gets together: Not on file    Attends religious service: Not on file    Active member of club or organization: Not on file    Attends meetings of clubs or organizations: Not on file    Relationship status: Not on file  . Intimate partner violence:    Fear of current or ex partner: Not on file    Emotionally abused: Not on file    Physically abused: Not on file    Forced sexual activity: Not on file  Other Topics Concern  . Not on file  Social History Narrative   Divorced.  2 sons.  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    Family History:    Family History  Problem Relation Age of Onset  . Cancer Mother        Lung cancer  . Coronary artery disease Mother   . Heart  failure Maternal Grandmother      ROS:  Please see the history of present illness.   All other ROS reviewed and negative.     Physical Exam/Data:   Vitals:   08/18/17 0600 08/18/17 0636 08/18/17 0700 08/18/17 0800  BP: 109/78 (!) 133/91 (!) 149/101 (!) 167/104  Pulse: 80 82 81 84  Resp: 14 11 13 15   Temp:    98 F (36.7 C)  TempSrc:    Oral  SpO2: 96% 98% 97% 97%  Weight:    152 lb 5.4 oz (69.1 kg)  Height:    5\' 11"  (1.803 m)    Intake/Output Summary (Last 24 hours) at 08/18/2017 0814 Last data filed at 08/18/2017 0523 Gross per 24 hour  Intake 1000 ml  Output -  Net 1000 ml   Filed Weights   08/18/17 0243 08/18/17 0800  Weight: 155 lb (70.3 kg) 152 lb 5.4 oz (69.1 kg)   Body mass index is 21.25 kg/m.  General:  Well nourished, well developed, in no mild distress due to dental pain HEENT: normal Lymph: no adenopathy Neck: no JVD Endocrine:  No thryomegaly Vascular: No carotid bruits; FA pulses 2+ bilaterally without bruits  Cardiac:  normal S1, S2; RRR; no murmur  Lungs:  clear to auscultation bilaterally, no wheezing, rhonchi or rales  Abd: soft, nontender, no hepatomegaly  Ext: no edema Musculoskeletal:  No deformities, BUE and BLE strength normal and equal Skin: warm and dry  Neuro:  CNs 2-12 intact, no focal abnormalities noted Psych:  Normal affect   EKG:  The EKG was personally reviewed and demonstrates:  Mild sinus tachycardia. LA abnormality. Minor T wave changes I and aVL. QTC 488 ms. Other than rate increase, no change from February. Telemetry:  Telemetry was personally reviewed and demonstrates:  NSR  Relevant CV Studies: ECHO 10/09/2016  - Left ventricle: The cavity size was normal. Systolic function was   normal. The estimated ejection fraction was in the range of 50%   to 55%. Hypokinesis of the basal-mid inferoseptal myocardium;   consistent with ischemia in the distribution of the posterior   descending coronary artery. Left ventricular  diastolic function   parameters were normal. - Left atrium: The atrium was mildly dilated.  CATH 04/11/2017 Conclusions: 1. Severe native coronary artery disease, as detailed below, similar to prior  catheterization from 03/2016. 2. Patent LIMA-LAD with 30-40% ostial LIMA narrowing that may reflect vasospasm. 3. All SVGs appear chronically occluded. 4. Mid/distal RCA stent with 70% in-stent restenosis that his hemodynamically significant by FFR (0.73). 5. Successful balloon angioplasty of mid/distal RCA in-stent restenosis with reduction of stenosis from 70% to <10%. 6. Eccentric 80-90% right common femoral artery stenosis just distal to arteriotomy site.  Recommendations: 1. Dual antiplatelet therapy with aspirin and clopidogrel for at least 1 month, ideally 12+ months. 2. Aggressive secondary prevention, including statin therapy and smoking cessation. 3. Close monitoring of pedal pulses, given significant right common femoral artery disease. Vascular surgery is aware of the patient and available if there is evidence of acute limb ischemia. Outpatient vascular surgery follow-up should be considered if Mr. Lisa RocaGillett experiences significant claudication after discharge.  Laboratory Data:  Chemistry Recent Labs  Lab 08/18/17 0304  NA 136  K 4.0  CL 104  CO2 20*  GLUCOSE 105*  BUN 9  CREATININE 1.00  CALCIUM 8.7*  GFRNONAA >60  GFRAA >60  ANIONGAP 12    No results for input(s): PROT, ALBUMIN, AST, ALT, ALKPHOS, BILITOT in the last 168 hours. Hematology Recent Labs  Lab 08/18/17 0304  WBC 9.7  RBC 5.28  HGB 17.9*  HCT 50.2  MCV 95.1  MCH 33.9  MCHC 35.7  RDW 13.2  PLT 208   Cardiac EnzymesNo results for input(s): TROPONINI in the last 168 hours.  Recent Labs  Lab 08/18/17 0317  TROPIPOC 0.00    BNPNo results for input(s): BNP, PROBNP in the last 168 hours.  DDimer No results for input(s): DDIMER in the last 168 hours.  Radiology/Studies:  Dg Chest 2  View  Result Date: 08/18/2017 CLINICAL DATA:  Acute onset of mid chest pain and shortness of breath. EXAM: CHEST - 2 VIEW COMPARISON:  Chest radiograph performed 04/09/2017 FINDINGS: The lungs are well-aerated. Calcified granulomata are noted at the right lung. There is no evidence of focal opacification, pleural effusion or pneumothorax. The heart is normal in size; the patient is status post median sternotomy, with evidence of prior CABG. No acute osseous abnormalities are seen. IMPRESSION: No acute cardiopulmonary process seen. Electronically Signed   By: Roanna RaiderJeffery  Chang M.D.   On: 08/18/2017 04:46    Assessment and Plan:   Mr. Arley PhenixGillette is experiencing angina pectoris due to severe elevation in blood pressure and tachycardia, in turn related to severe dental pain and noncompliance with cardiac medications.  His ECG and cardiac enzymes so far do not support a true acute coronary syndrome.  Although he is at risk for recurrent in-stent restenosis in the right coronary artery, it is a little early for that development, less than 5 months from the last procedure.  In addition, the dental pain and elevation in blood pressure clearly preceded the current complaints of angina pectoris.  Focus on treatment of his blood pressure and his dental abscess.  I suspect his angina will resolve once this is fully addressed.  If his angina is still an issue once the vital signs have normalized and he is back on his heart medications, consider evaluation with a Lexiscan Myoview for possible in-stent restenosis.  Access to necessary cardiac medications should also be addressed, since otherwise he is at high risk for repeat hospitalization.   For questions or updates, please contact [facility] Please consult [email or web address] for contact info under Cardiology/STEMI.   Signed, Thurmon FairMihai Bob Daversa, MD  08/18/2017 8:14 AM

## 2017-08-18 NOTE — ED Triage Notes (Signed)
Pt arriving with complaint of dental pain which began a couple days ago and chest pain off and on for 3 days. Pt took tylenol around 3 hours ago with no relief.

## 2017-08-18 NOTE — ED Notes (Signed)
Bed: WA15 Expected date:  Expected time:  Means of arrival:  Comments: Triage 1  

## 2017-08-18 NOTE — ED Notes (Signed)
ED TO INPATIENT HANDOFF REPORT  Name/Age/Gender Douglas [name] 52 y.o. male  Code Status Code Status History    Date Active Date Inactive Code Status Order ID Comments User Context   04/10/2017 0511 04/12/2017 1836 Full Code [id number]  Rise Patience, MD ED   10/08/2016 2205 10/09/2016 2004 Full Code [id number]  Rise Patience, MD ED   03/14/2016 0612 03/27/2016 1927 Full Code [id number]  Sueanne Margarita, MD ED   04/16/2015 1047 04/19/2015 1557 Full Code [id number]  Debbe Odea, MD ED   04/06/2015 1157 04/10/2015 1617 Full Code [id number]  Rama, Venetia Maxon, MD ED   03/05/2015 1443 03/10/2015 1735 Full Code [id number]  Velvet Bathe, MD Inpatient   01/18/2014 1714 01/19/2014 1729 Full Code [id number]  Lorretta Harp, MD Inpatient   01/18/2014 0559 01/18/2014 1714 Full Code [id number]  Jacolyn Reedy, MD ED      Home/SNF/Other Home  Chief Complaint CHEST PAIN, DENTAL PAIN  Level of Care/Admitting Diagnosis ED Disposition    ED Disposition Condition [facility] Area: [facility] [100102]  Level of Care: Stepdown [14]  Admit to SDU based on following criteria: Cardiac Instability:  Patients experiencing chest pain, unconfirmed MI and stable, arrhythmias and CHF requiring medical management and potentially compromising patient's stability  Diagnosis: Chest pain [737106]  Admitting Physician: Jani Gravel [[id number]]  Attending Physician: Jani Gravel [[id number]]  PT Class (Do Not Modify): Observation [104]  PT Acc Code (Do Not Modify): Observation [10022]       Medical History Past Medical History:  Diagnosis Date  . Anginal pain ([place])   . Coronary artery disease   . GERD (gastroesophageal reflux disease)   . History of acute pancreatitis 04/06/2015  . NSTEMI (non-ST elevated myocardial infarction) ([place]) 01/2014  . Pancreatitis   . PTSD (post-traumatic stress disorder)   . Tobacco abuse     Allergies Allergies  Allergen Reactions  . Ibuprofen  Nausea And Vomiting    UPSETS STOMACH  . Shellfish Allergy Nausea And Vomiting    IV Location/Drains/Wounds Patient Lines/Drains/Airways Status   Active Line/Drains/Airways    Name:   Placement date:   Placement time:   Site:   Days:   Peripheral IV 08/18/17 Right Forearm   08/18/17    0310    Forearm   less than 1          Labs/Imaging Results for orders placed or performed during the hospital encounter of 08/18/17 (from the past 48 hour(s))  CBC with Differential     Status: Abnormal   Collection Time: 08/18/17  3:04 AM  Result Value Ref Range   WBC 9.7 4.0 - 10.5 K/uL   RBC 5.28 4.22 - 5.81 MIL/uL   Hemoglobin 17.9 (H) 13.0 - 17.0 g/dL   HCT 50.2 39.0 - 52.0 %   MCV 95.1 78.0 - 100.0 fL   MCH 33.9 26.0 - 34.0 pg   MCHC 35.7 30.0 - 36.0 g/dL   RDW 13.2 11.5 - 15.5 %   Platelets 208 150 - 400 K/uL   Neutrophils Relative % 63 %   Neutro Abs 6.2 1.7 - 7.7 K/uL   Lymphocytes Relative 26 %   Lymphs Abs 2.5 0.7 - 4.0 K/uL   Monocytes Relative 8 %   Monocytes Absolute 0.8 0.1 - 1.0 K/uL   Eosinophils Relative 2 %   Eosinophils Absolute 0.2 0.0 - 0.7 K/uL   Basophils Relative 1 %   Basophils Absolute 0.1 0.0 -  0.1 K/uL    Comment: Performed at [facility], [place] [address] [place], [place] [address]  Basic metabolic panel     Status: Abnormal   Collection Time: 08/18/17  3:04 AM  Result Value Ref Range   Sodium 136 135 - 145 mmol/L   Potassium 4.0 3.5 - 5.1 mmol/L   Chloride 104 101 - 111 mmol/L   CO2 20 (L) 22 - 32 mmol/L   Glucose, Bld 105 (H) 65 - 99 mg/dL   BUN 9 6 - 20 mg/dL   Creatinine, Ser 1.00 0.61 - 1.24 mg/dL   Calcium 8.7 (L) 8.9 - 10.3 mg/dL   GFR calc non Af Amer >60 >60 mL/min   GFR calc Af Amer >60 >60 mL/min    Comment: (NOTE) The eGFR has been calculated using the CKD EPI equation. This calculation has not been validated in all clinical situations. eGFR's persistently <60 mL/min signify possible Chronic Kidney Disease.    Anion  gap 12 5 - 15    Comment: Performed at [facility], [place] [address], [place], [place] [address]  I-Stat Troponin, ED (not at [facility])     Status: None   Collection Time: 08/18/17  3:17 AM  Result Value Ref Range   Troponin i, poc 0.00 0.00 - 0.08 ng/mL   Comment 3            Comment: Due to the release kinetics of cTnI, a negative result within the first hours of the onset of symptoms does not rule out myocardial infarction with certainty. If myocardial infarction is still suspected, repeat the test at appropriate intervals.    Dg Chest 2 View  Result Date: 08/18/2017 CLINICAL DATA:  Acute onset of mid chest pain and shortness of breath. EXAM: CHEST - 2 VIEW COMPARISON:  Chest radiograph performed 04/09/2017 FINDINGS: The lungs are well-aerated. Calcified granulomata are noted at the right lung. There is no evidence of focal opacification, pleural effusion or pneumothorax. The heart is normal in size; the patient is status post median sternotomy, with evidence of prior CABG. No acute osseous abnormalities are seen. IMPRESSION: No acute cardiopulmonary process seen. Electronically Signed   By: Garald Balding M.D.   On: 08/18/2017 04:46    Pending Labs Unresulted Labs (From admission, onward)   Start     Ordered   08/18/17 0620  Troponin I (q 6hr x 3)  Now then every 6 hours,   R     08/18/17 0619      Vitals/Pain Today's Vitals   08/18/17 0538 08/18/17 0600 08/18/17 0636 08/18/17 0636  BP: 124/88 109/78  (!) 133/91  Pulse: 87 80  82  Resp: _0 Temp:      TempSrc:      SpO2: 99% 96%  98%  Weight:      Height:      PainSc:   6  6     Isolation Precautions No active isolations  Medications Medications  oxyCODONE-acetaminophen (PERCOCET/ROXICET) 5-325 MG per tablet 1 tablet (1 tablet Oral Given 08/18/17 0301)  nitroGLYCERIN (NITROSTAT) SL tablet 0.4 mg (0.4 mg Sublingual Given 08/18/17 0541)  sodium chloride 0.9 % bolus 1,000 mL (0 mLs Intravenous Stopped  08/18/17 0523)  morphine 4 MG/ML injection 4 mg (4 mg Intravenous Given 08/18/17 0424)  aspirin chewable tablet 324 mg (324 mg Oral Given 08/18/17 0425)  fentaNYL (SUBLIMAZE) injection 50 mcg (50 mcg Intravenous Given 08/18/17 0652)    Mobility walks

## 2017-08-18 NOTE — ED Notes (Signed)
After first dose of NGT pt still experiencing pain in chest that is sharp in nature. Pt rates pain 7/10

## 2017-08-19 DIAGNOSIS — I2510 Atherosclerotic heart disease of native coronary artery without angina pectoris: Secondary | ICD-10-CM

## 2017-08-19 DIAGNOSIS — Z9119 Patient's noncompliance with other medical treatment and regimen: Secondary | ICD-10-CM

## 2017-08-19 DIAGNOSIS — F1010 Alcohol abuse, uncomplicated: Secondary | ICD-10-CM

## 2017-08-19 LAB — COMPREHENSIVE METABOLIC PANEL
ALK PHOS: 77 U/L (ref 38–126)
ALT: 15 U/L — ABNORMAL LOW (ref 17–63)
ANION GAP: 7 (ref 5–15)
AST: 18 U/L (ref 15–41)
Albumin: 3.2 g/dL — ABNORMAL LOW (ref 3.5–5.0)
BILIRUBIN TOTAL: 0.4 mg/dL (ref 0.3–1.2)
BUN: 7 mg/dL (ref 6–20)
CALCIUM: 8.5 mg/dL — AB (ref 8.9–10.3)
CO2: 26 mmol/L (ref 22–32)
Chloride: 102 mmol/L (ref 101–111)
Creatinine, Ser: 1.08 mg/dL (ref 0.61–1.24)
GFR calc non Af Amer: >60 mL/min (ref >60)
Glucose, Bld: 128 mg/dL — ABNORMAL HIGH (ref 65–99)
POTASSIUM: 4.3 mmol/L (ref 3.5–5.1)
Sodium: 135 mmol/L (ref 135–145)
TOTAL PROTEIN: 6 g/dL — AB (ref 6.5–8.1)

## 2017-08-19 LAB — CBC WITH DIFFERENTIAL/PLATELET
BASOS PCT: 0 %
Basophils Absolute: 0 10*3/uL (ref 0.0–0.1)
EOS ABS: 0.1 10*3/uL (ref 0.0–0.7)
Eosinophils Relative: 2 %
HEMATOCRIT: 45.5 % (ref 39.0–52.0)
HEMOGLOBIN: 15.6 g/dL (ref 13.0–17.0)
LYMPHS ABS: 0.8 10*3/uL (ref 0.7–4.0)
Lymphocytes Relative: 9 %
MCH: 33.1 pg (ref 26.0–34.0)
MCHC: 34.3 g/dL (ref 30.0–36.0)
MCV: 96.4 fL (ref 78.0–100.0)
MONO ABS: 0.6 10*3/uL (ref 0.1–1.0)
Monocytes Relative: 7 %
NEUTROS PCT: 82 %
Neutro Abs: 7.6 10*3/uL (ref 1.7–7.7)
Platelets: 177 10*3/uL (ref 150–400)
RBC: 4.72 MIL/uL (ref 4.22–5.81)
RDW: 13.2 % (ref 11.5–15.5)
WBC: 9.2 10*3/uL (ref 4.0–10.5)

## 2017-08-19 LAB — MAGNESIUM: MAGNESIUM: 1.9 mg/dL (ref 1.7–2.4)

## 2017-08-19 LAB — PHOSPHORUS: PHOSPHORUS: 2.5 mg/dL (ref 2.5–4.6)

## 2017-08-19 MED ORDER — AMLODIPINE BESYLATE 5 MG PO TABS
5.0000 mg | ORAL_TABLET | Freq: Every day | ORAL | Status: DC
  Administered 2017-08-19: 5 mg via ORAL
  Filled 2017-08-19: qty 1

## 2017-08-19 MED ORDER — AMLODIPINE BESYLATE 10 MG PO TABS
10.0000 mg | ORAL_TABLET | Freq: Every day | ORAL | Status: DC
  Administered 2017-08-20 – 2017-08-21 (×2): 10 mg via ORAL
  Filled 2017-08-19 (×2): qty 1

## 2017-08-19 MED ORDER — HYDRALAZINE HCL 20 MG/ML IJ SOLN: 5.0000 mg | Freq: Four times a day (QID) | INTRAMUSCULAR | Status: DC | PRN

## 2017-08-19 MED ORDER — BENZOCAINE 10 % MT GEL
Freq: Four times a day (QID) | OROMUCOSAL | Status: DC | PRN
  Filled 2017-08-19: qty 9.4

## 2017-08-19 MED ORDER — AMOXICILLIN-POT CLAVULANATE 875-125 MG PO TABS
1.0000 | ORAL_TABLET | Freq: Two times a day (BID) | ORAL | Status: DC
  Administered 2017-08-19 – 2017-08-21 (×5): 1 via ORAL
  Filled 2017-08-19 (×5): qty 1

## 2017-08-19 NOTE — Progress Notes (Signed)
Progress Note  Patient Name: Douglas EndsJonathan Restivo Date of Encounter: 08/19/2017  Primary Cardiologist: Donnie Ahoilley  Subjective   Still with intermittent chest pressure   Pleuritic in nature   NTG has helped but has headache    Inpatient Medications    Scheduled Meds: . aspirin  81 mg Oral Daily  . clopidogrel  75 mg Oral Daily  . enoxaparin (LOVENOX) injection  40 mg Subcutaneous Q24H  . metoprolol tartrate  25 mg Oral BID  . nicotine  14 mg Transdermal Daily  . nitroGLYCERIN  1 inch Topical Q8H  . penicillin v potassium  500 mg Oral Q6H  . rosuvastatin  10 mg Oral q1800   Continuous Infusions:  PRN Meds: ibuprofen, morphine injection, nitroGLYCERIN   Vital Signs    Vitals:   08/18/17 1324 08/18/17 1639 08/18/17 2122 08/19/17 0458  BP: 122/87 102/66 (!) 164/99 (!) 150/90  Pulse: 72 92 79 90  Resp: 18  18 16   Temp: 98.4 F (36.9 C)  97.6 F (36.4 C) 98.5 F (36.9 C)  TempSrc: Oral  Oral Oral  SpO2: 98% 93% 98% 98%  Weight:      Height:        Intake/Output Summary (Last 24 hours) at 08/19/2017 0756 Last data filed at 08/18/2017 0900 Gross per 24 hour  Intake -  Output 450 ml  Net -450 ml   Filed Weights   08/18/17 0243 08/18/17 0800  Weight: 70.3 kg (155 lb) 69.1 kg (152 lb 5.4 oz)    Telemetry    SR - Personally Reviewed  ECG      Physical Exam   GEN: No acute distress.   Neck: No JVD Cardiac: RRR, no murmurs, rubs, or gallops.  Chest   Tender to palpation  Respiratory: Clear to auscultation bilaterally. GI: Soft, nontender, non-distended  MS: No edema; No deformity. Neuro:  Nonfocal  Psych: Normal affect   Labs    Chemistry Recent Labs  Lab 08/18/17 0304  NA 136  K 4.0  CL 104  CO2 20*  GLUCOSE 105*  BUN 9  CREATININE 1.00  CALCIUM 8.7*  GFRNONAA >60  GFRAA >60  ANIONGAP 12     Hematology Recent Labs  Lab 08/18/17 0304  WBC 9.7  RBC 5.28  HGB 17.9*  HCT 50.2  MCV 95.1  MCH 33.9  MCHC 35.7  RDW 13.2  PLT 208     Cardiac Enzymes Recent Labs  Lab 08/18/17 0758 08/18/17 1209 08/18/17 2108  TROPONINI <0.03 <0.03 <0.03    Recent Labs  Lab 08/18/17 0317  TROPIPOC 0.00     BNPNo results for input(s): BNP, PROBNP in the last 168 hours.   DDimer No results for input(s): DDIMER in the last 168 hours.   Radiology    Dg Chest 2 View  Result Date: 08/18/2017 CLINICAL DATA:  Acute onset of mid chest pain and shortness of breath. EXAM: CHEST - 2 VIEW COMPARISON:  Chest radiograph performed 04/09/2017 FINDINGS: The lungs are well-aerated. Calcified granulomata are noted at the right lung. There is no evidence of focal opacification, pleural effusion or pneumothorax. The heart is normal in size; the patient is status post median sternotomy, with evidence of prior CABG. No acute osseous abnormalities are seen. IMPRESSION: No acute cardiopulmonary process seen. Electronically Signed   By: Roanna RaiderJeffery  Chang M.D.   On: 08/18/2017 04:46    Cardiac Studies     Patient Profile     52 y.o. male hx of CAD  Consulted for evaulation of CP    Assessment & Plan    1  CP  Pt with CP that appears atypical for coronary ischemia   Pleuritic in nature   Some reprod with chest compression.   Trop negative     Recomm Treat BP as this is not helping      2  CAD   Pt with extensive cardiac history   (stent 2015; CABG  Jan 2018 (LIMA toLAD; SVG to OM; SVG to Diag; SVG to PDA/PLSA)   In Jan 2019 underwent PTCA for instent restenosis)   Pt admits to missing plavix for about 1 month   "I need [place] to clinic to pick some up"   Reiterated importance of taking    Back on      2  HTN  BP is high but improved   Would start amlodipine  Stop NTG paste with HA.  3  Tob  Says he has quit  4  EtOH   Says he drinks an occasional wine with dinner  Stressed care to not overdo, risk of bleeding on ASA/PLAvix  5  Noncompliance  Discussed ramifications of missing plavix, follow ups.    For questions or updates, please contact  [facility] Please consult [email or web address] for contact info under Cardiology/STEMI.      Signed, Dietrich Pates, MD  08/19/2017, 7:56 AM

## 2017-08-19 NOTE — Progress Notes (Signed)
Pt was a no show for his appointment with [facility] 2/12 at 10 am. Pt will need to call for himself to make an appointment related to no show. Pt was also assist with his medications.  Will not be able to assist pt at present time for  medications

## 2017-08-19 NOTE — Progress Notes (Signed)
PROGRESS NOTE    Douglas [name]  WUJ:[id number] DOB: 29-Mar-1965 DOA: 08/18/2017 PCP: Patient, No Pcp Per   Brief Narrative:  Douglas [name] is a 52 y.o. male with medical history significant of coronary artery disease, status post stents to right coronary artery, multivessel disease status post CABG in 2018, balloon angioplasty who presented to the emergency department with complaints of right-sided chest pain.  Patient's chest pain was atypical.  He said the pain was present at rest.  Patient was sharp, shooting in nature and not radiating anywhere.  Patient is also having bothering toothache and has severe dental caries.  Patient says the chest pain became worse  after he developed dental pain.  Both chest pain and dental pain started yesterday.  He said that the chest pain was similar to the last angina episodes.  Chest pain did not improve with sublingual nitroglycerin.  And says he also was not taking his home medications because he could not afford it. Was admitted for his Chest Pain and Cardiology was consulted and recommending Treatment of Blood pressure and Dental Abscess and feel that his CP will resolve once this happens. Cardiology also added Amlodipine to the Regimen which has improved his BP but still remains high so dose was increased. Dentistry Dr. Kristin Bruins was paged and awaiting call back.   Assessment & Plan:   Principal Problem:   Chest pain Active Problems:   Tobacco abuse   Alcohol abuse   CAD (coronary artery disease), native coronary artery   Patient's noncompliance with other medical treatment and regimen   Tooth abscess  Chest Pain -Appears Atypical for ACS -Complains right sided chest pain and was a 6/10 in severity today. Present at rest.  -Troponin Negative x3  and EKG without any ischemic changes. -Cardiology recommending Treating BP as it may be making the Chest Pain worse -Increased Amlodipine  -NTG Paste causing a Headache so was D/C'd by Cardiology and  he was started on 0.4 mg SL q62min PRN  Coronary Artery Disease  -History of extensive coronary artery disease.  Status post stents, CABG, balloon angioplasty.   -Follows up with Cardiology in the outpatient -Has been missing medications due to Non-compliance -Continue Aspirin 81 mg po Daily, Clopidogrel 75 mg po Daily and Rosuvastatin 10 mg po Daily.  Tooth Ache with likely Dental Abscess.   -He is complaining of pain on left lower tooth.   -Started on Penicillin and changed to Augmentin 875-125 mg po q12h,  -C/w Pain Management.   -He has extensive dental caries.   -Called Dr. Mayford Knife in Dentistry and recommended calling In-House Dentist Dr. Kristin Bruins to evaluate and still awaiting call back from Dr. Kristin Bruins. -C/w Pain Control with IV Morphine 2 mg q4hprn for Sever Pain -Add Benzocaine 10% mucosal gel QIDprn  History of Peripheral Vascular Disease:  -History of significant right common femoral artery disease.   -Continue antiplatelet therapy and statin.   -Follow-up with vascular surgery as an outpatient.  History of Alcohol Abuse and Tobacco Abuse  -Counseled for cessation for both.  He has history of alcoholic pancreatitis -C/w Nicotine Patch 14 mg TD q24h.  Essential Hypertension -BP better controlled but still high -Cardiology added Amlodipine 5 mg but because still uncontrolled increased to 10 mg po Daily -C/w Metoprolol 25 mg po BID -If remains Elevated may need ACE-I/ARB  Noncompliance -Counseled for importance of compliance on his medication. -Unfortunately cannot give a MATCH letter as patient is already had one early this January.  Hyperlipidemia -C/w Rosuvastatin  10 mg po Daily   Hyperglycemia -Patient's Blood Sugars on BMP/CMP ranging from 105-128 -Check HbA1c -Continue to Monitor   DVT prophylaxis: Enoxaparin 40 mg sq q24h Code Status: FULL CODE Family Communication: Discussed with family at bedside  Disposition Plan: Anticipate D/C Home in next  24-48 hours if BP is better controlled and CP is improved.   Consultants:   Cardiology  Paged General Dentistry Dr. Kristin Bruins for recommendations and no answer so left message    Procedures: None   Antimicrobials:  Anti-infectives (From admission, onward)   Start     Dose/Rate Route Frequency Ordered Stop   08/19/17 1130  amoxicillin-clavulanate (AUGMENTIN) 875-125 MG per tablet 1 tablet     1 tablet Oral Every 12 hours 08/19/17 1053     08/18/17 0900  penicillin v potassium (VEETID) tablet 500 mg  Status:  Discontinued     500 mg Oral Every 6 hours 08/18/17 0834 08/19/17 1053     Subjective: Examined bedside and felt uncomfortable stated any significant tooth pain was a 12 out of 10 in the chest pain is a 6 out of 10.  No nausea or vomiting.  Denies any other complaints or concerns at this time and also had a headache earlier.   Objective: Vitals:   08/19/17 0458 08/19/17 0931 08/19/17 0931 08/19/17 1409  BP: (!) 150/90 (!) 161/99 (!) 161/99 (!) 157/95  Pulse: 90  93 (!) 117  Resp: 16   16  Temp: 98.5 F (36.9 C)   99.9 F (37.7 C)  TempSrc: Oral   Oral  SpO2: 98%   98%  Weight:      Height:       No intake or output data in the 24 hours ending 08/19/17 1454 Filed Weights   08/18/17 0243 08/18/17 0800  Weight: 70.3 kg (155 lb) 69.1 kg (152 lb 5.4 oz)   Examination: Physical Exam:  Constitutional: WN/WD thin Caucasian male who appears uncomfortable Eyes: Lids and conjunctivae normal, sclerae anicteric  ENMT: External Ears, Nose appear normal. Grossly normal hearing. Mucous membranes are moist. Poor Dentition with Dental Caries.  Neck: Appears normal, supple, no cervical masses, normal ROM, no appreciable thyromegaly, no JVD Respiratory: Diminished to auscultation bilaterally, no wheezing, rales, rhonchi or crackles. Normal respiratory effort and patient is not tachypenic. No accessory muscle use.  Cardiovascular: RRR, no murmurs / rubs / gallops. S1 and S2  auscultated. No extremity edema.  Abdomen: Soft, non-tender, non-distended. No masses palpated. No appreciable hepatosplenomegaly. Bowel sounds positive x4.  GU: Deferred. Musculoskeletal: No clubbing / cyanosis of digits/nails. No joint deformity upper and lower extremities.  Skin: No rashes, lesions, ulcers on a limited skin evaluation. No induration; Warm and dry.  Neurologic: CN 2-12 grossly intact with no focal deficits. Romberg sign and cerebellar reflexes not assessed.  Psychiatric: Normal judgment and insight. Alert and oriented x 3. Anxious and agitated mood and appropriate affect.   Data Reviewed: I have personally reviewed following labs and imaging studies  CBC: Recent Labs  Lab 08/18/17 0304 08/19/17 0913  WBC 9.7 9.2  NEUTROABS 6.2 7.6  HGB 17.9* 15.6  HCT 50.2 45.5  MCV 95.1 96.4  PLT 208 177   Basic Metabolic Panel: Recent Labs  Lab 08/18/17 0304 08/19/17 0913  NA 136 135  K 4.0 4.3  CL 104 102  CO2 20* 26  GLUCOSE 105* 128*  BUN 9 7  CREATININE 1.00 1.08  CALCIUM 8.7* 8.5*  MG  --  1.9  PHOS  --  2.5   GFR: Estimated Creatinine Clearance: 80 mL/min (by C-G formula based on SCr of 1.08 mg/dL). Liver Function Tests: Recent Labs  Lab 08/19/17 0913  AST 18  ALT 15*  ALKPHOS 77  BILITOT 0.4  PROT 6.0*  ALBUMIN 3.2*   No results for input(s): LIPASE, AMYLASE in the last 168 hours. No results for input(s): AMMONIA in the last 168 hours. Coagulation Profile: No results for input(s): INR, PROTIME in the last 168 hours. Cardiac Enzymes: Recent Labs  Lab 08/18/17 0758 08/18/17 1209 08/18/17 2108  TROPONINI <0.03 <0.03 <0.03   BNP (last 3 results) No results for input(s): PROBNP in the last 8760 hours. HbA1C: No results for input(s): HGBA1C in the last 72 hours. CBG: No results for input(s): GLUCAP in the last 168 hours. Lipid Profile: No results for input(s): CHOL, HDL, LDLCALC, TRIG, CHOLHDL, LDLDIRECT in the last 72 hours. Thyroid  Function Tests: No results for input(s): TSH, T4TOTAL, FREET4, T3FREE, THYROIDAB in the last 72 hours. Anemia Panel: No results for input(s): VITAMINB12, FOLATE, FERRITIN, TIBC, IRON, RETICCTPCT in the last 72 hours. Sepsis Labs: No results for input(s): PROCALCITON, LATICACIDVEN in the last 168 hours.  Recent Results (from the past 240 hour(s))  MRSA PCR Screening     Status: None   Collection Time: 08/18/17  9:17 AM  Result Value Ref Range Status   MRSA by PCR NEGATIVE NEGATIVE Final    Comment:        The GeneXpert MRSA Assay (FDA approved for NASAL specimens only), is one component of a comprehensive MRSA colonization surveillance program. It is not intended to diagnose MRSA infection nor to guide or monitor treatment for MRSA infections. Performed at [facility], [address] [place], [place] [address]     Radiology Studies: Dg Chest 2 View  Result Date: 08/18/2017 CLINICAL DATA:  Acute onset of mid chest pain and shortness of breath. EXAM: CHEST - 2 VIEW COMPARISON:  Chest radiograph performed 04/09/2017 FINDINGS: The lungs are well-aerated. Calcified granulomata are noted at the right lung. There is no evidence of focal opacification, pleural effusion or pneumothorax. The heart is normal in size; the patient is status post median sternotomy, with evidence of prior CABG. No acute osseous abnormalities are seen. IMPRESSION: No acute cardiopulmonary process seen. Electronically Signed   By: Roanna RaiderJeffery  Chang M.D.   On: 08/18/2017 04:46   Scheduled Meds: . [START ON 08/20/2017] amLODipine  10 mg Oral Daily  . amoxicillin-clavulanate  1 tablet Oral Q12H  . aspirin  81 mg Oral Daily  . clopidogrel  75 mg Oral Daily  . enoxaparin (LOVENOX) injection  40 mg Subcutaneous Q24H  . metoprolol tartrate  25 mg Oral BID  . nicotine  14 mg Transdermal Daily  . rosuvastatin  10 mg Oral q1800   Continuous Infusions:   LOS: 0 days   Merlene Laughtermair Latif Hilja Kintzel, DO [facility] Pager [phone]  If 7PM-7AM, please contact night-coverage [email or web address] Password [email or web address] 08/19/2017, 2:54 PM

## 2017-08-20 ENCOUNTER — Encounter (HOSPITAL_COMMUNITY): Payer: Self-pay | Admitting: Dentistry

## 2017-08-20 ENCOUNTER — Inpatient Hospital Stay (HOSPITAL_COMMUNITY): Payer: Self-pay

## 2017-08-20 DIAGNOSIS — K029 Dental caries, unspecified: Secondary | ICD-10-CM

## 2017-08-20 DIAGNOSIS — R509 Fever, unspecified: Secondary | ICD-10-CM

## 2017-08-20 DIAGNOSIS — R6510 Systemic inflammatory response syndrome (SIRS) of non-infectious origin without acute organ dysfunction: Secondary | ICD-10-CM

## 2017-08-20 LAB — CBC WITH DIFFERENTIAL/PLATELET
BASOS PCT: 1 %
Basophils Absolute: 0 10*3/uL (ref 0.0–0.1)
EOS ABS: 0.1 10*3/uL (ref 0.0–0.7)
Eosinophils Relative: 1 %
HEMATOCRIT: 48 % (ref 39.0–52.0)
Hemoglobin: 16.4 g/dL (ref 13.0–17.0)
Lymphocytes Relative: 19 %
Lymphs Abs: 1.2 10*3/uL (ref 0.7–4.0)
MCH: 32.4 pg (ref 26.0–34.0)
MCHC: 34.2 g/dL (ref 30.0–36.0)
MCV: 94.9 fL (ref 78.0–100.0)
MONO ABS: 0.5 10*3/uL (ref 0.1–1.0)
MONOS PCT: 8 %
NEUTROS ABS: 4.7 10*3/uL (ref 1.7–7.7)
NEUTROS PCT: 71 %
Platelets: 163 10*3/uL (ref 150–400)
RBC: 5.06 MIL/uL (ref 4.22–5.81)
RDW: 13.2 % (ref 11.5–15.5)
WBC: 6.5 10*3/uL (ref 4.0–10.5)

## 2017-08-20 LAB — MAGNESIUM: MAGNESIUM: 2 mg/dL (ref 1.7–2.4)

## 2017-08-20 LAB — PHOSPHORUS: PHOSPHORUS: 4.1 mg/dL (ref 2.5–4.6)

## 2017-08-20 LAB — COMPREHENSIVE METABOLIC PANEL
ALK PHOS: 74 U/L (ref 38–126)
ALT: 13 U/L — ABNORMAL LOW (ref 17–63)
ANION GAP: 7 (ref 5–15)
AST: 18 U/L (ref 15–41)
Albumin: 3.3 g/dL — ABNORMAL LOW (ref 3.5–5.0)
BUN: 7 mg/dL (ref 6–20)
CALCIUM: 8.5 mg/dL — AB (ref 8.9–10.3)
CO2: 30 mmol/L (ref 22–32)
CREATININE: 1.1 mg/dL (ref 0.61–1.24)
Chloride: 98 mmol/L — ABNORMAL LOW (ref 101–111)
Glucose, Bld: 144 mg/dL — ABNORMAL HIGH (ref 65–99)
Potassium: 3.5 mmol/L (ref 3.5–5.1)
SODIUM: 135 mmol/L (ref 135–145)
Total Bilirubin: 0.6 mg/dL (ref 0.3–1.2)
Total Protein: 6.1 g/dL — ABNORMAL LOW (ref 6.5–8.1)

## 2017-08-20 MED ORDER — IOHEXOL 300 MG/ML  SOLN
75.0000 mL | Freq: Once | INTRAMUSCULAR | Status: AC | PRN
  Administered 2017-08-20: 75 mL via INTRAVENOUS

## 2017-08-20 MED ORDER — SODIUM CHLORIDE 0.9 % IV SOLN
INTRAVENOUS | Status: AC
  Administered 2017-08-20: 17:00:00 via INTRAVENOUS

## 2017-08-20 NOTE — Progress Notes (Signed)
PROGRESS NOTE    Douglas [name]  ZOX:[id number] DOB: [birth year] DOA: 08/18/2017 PCP: Patient, No Pcp Per   Brief Narrative:  Douglas [name] is a 52 y.o. male with medical history significant of coronary artery disease, status post stents to right coronary artery, multivessel disease status post CABG in 2018, balloon angioplasty who presented to the emergency department with complaints of right-sided chest pain.  Patient's chest pain was atypical.  He said the pain was present at rest.  Patient was sharp, shooting in nature and not radiating anywhere.  Patient is also having bothering toothache and has severe dental caries.  Patient says the chest pain became worse  after he developed dental pain.  Both chest pain and dental pain started the day before admission.  He said that the chest pain was similar to the last angina episodes.  Chest pain did not improve with sublingual nitroglycerin.  And says he also was not taking his home medications because he could not afford it.   Was admitted for his Chest Pain and Cardiology was consulted and recommending Treatment of Blood pressure and Dental Abscess and feel that his CP will resolve once this happens. Cardiology also added Amlodipine to the Regimen which has improved his BP but still remains high so dose was increased. Dentistry Dr. Kristin Bruins was consulted who recommended the patient be seen by Dr. Barbette Merino in consultation for #20 Tooth Extraction.   Patient spiked a temperature overnight and was 101 so will be observed again overnight and given gentle IVF, Have a Maxillofacial CT, and obtain Blood Cultures. Anticipate D/C in AM so he can follow up directly at Dr. Randa Evens office at 1:00 pm for scheduled appointment directly following hospitalization.   Assessment & Plan:   Principal Problem:   Chest pain Active Problems:   Tobacco abuse   Alcohol abuse   CAD (coronary artery disease), native coronary artery   Patient's noncompliance with other  medical treatment and regimen   Tooth abscess  Chest Pain -Appears Atypical for ACS -Complains right sided chest pain and was a 6/10 in severity today. Present at rest.  -Troponin Negative x3  and EKG without any ischemic changes. -Cardiology recommending Treating BP as it may be making the Chest Pain worse; Amlodipine increased to 10 mg po Daily  -Increased Amlodipine  -NTG Paste causing a Headache so was D/C'd by Cardiology and he was started on 0.4 mg SL q60min PRN  Coronary Artery Disease  -History of extensive coronary artery disease.  Status post stents, CABG, balloon angioplasty.   -Follows up with Cardiology in the outpatient -Has been missing medications due to Non-compliance -Continue Aspirin 81 mg po Daily, Clopidogrel 75 mg po Daily and Rosuvastatin 10 mg po Daily and unfortunately cannot give MATCH Letter as patient already had it in January.  -Cardiology following and appreciate evaluation and recommendations  Tooth Ache with likely Dental Abscess.   -He is complaining of pain on left lower tooth.   -Started on IV Penicillin and changed to Augmentin 875-125 mg po q12h,  -C/w Pain Management.   -He has extensive dental caries and had an Orthopantogram today in the Department of Dental Medicine  -Called Dr. Mayford Knife in Dentistry yesterday and recommended calling In-House Dentist Dr. Kristin Bruins. Dr. Kristin Bruins evaluated today and feels as if the patient needs Extraction of Tooth #20 at discharged and have it done at Dr. Randa Evens office as Dr. Kristin Bruins is out of the office for the next week -Will get CT Maxillofacial Today given patient's  Temperature.  -C/w Pain Control with IV Morphine 2 mg q4hprn for Sever Pain -Add Benzocaine 10% mucosal gel QIDprn -I called Dr. Randa Evens office and spoke with the scheduler and patient has been scheduled for a follow-up appointment directly following hospitalization at 1 PM tomorrow 08/21/2017 (If medically stable to D/C) and was told to call back if  she is unable to make the appointment or unable to be discharged in the AM. Dr. Barbette Merino to decide about Plavix   Fever/SIRS -Patient Spiked a Temperature of 101 overnight and also became tachycardic -Likely related to dental infection however will need to rule out other causes -We will obtain CT maxillofacial with contrast to evaluate further -Obtain Blood cultures x2 -Start Gentle IVF Hydration with NS at 75 mL/hr x 12 hours -Antibiotic therapy was recently changed from IV penicillin to p.o. Augmentin -If Patient's fever has improved in the a.m. can be discharged and have patient directly follow up with Dr. Randa Evens office for dental infection  History of Peripheral Vascular Disease:  -History of significant right common femoral artery disease.   -Continue Antiplatelet therapy and statin.   -Follow-up with vascular surgery as an outpatient.  History of Alcohol Abuse and Tobacco Abuse  -Counseled for cessation for both.  He has history of alcoholic pancreatitis -C/w Nicotine Patch 14 mg TD q24h.  Essential Hypertension -BP better controlled at 114/79 -Cardiology added Amlodipine 5 mg but because still uncontrolled yesterday increased to 10 mg po Daily -C/w Metoprolol 25 mg po BID -If remains Elevated may need ACE-I/ARB  Noncompliance -Counseled for importance of compliance on his medication. -Unfortunately cannot give a MATCH letter as patient is already had one early this January. -Will need to Discuss with Care Management about Medication Assistance  Hyperlipidemia -C/w Rosuvastatin 10 mg po Daily   Hyperglycemia -Patient's Blood Sugars on BMP/CMP ranging from 105-144 -Check HbA1c; Last one was 5.3 in 2018 -Continue to Monitor   Erythrocytosis -Likely in the setting of Smoking -Hb was 17.9 on Admission and is now 16.4 -Continue to Monitor and repeat CBC in AM   DVT prophylaxis: Enoxaparin 40 mg sq q24h Code Status: FULL CODE Family Communication: Discussed with  family at bedside  Disposition Plan: Anticipate D/C Home in next 24 hours if afebrile and Pain is better  Consultants:   Cardiology  General Dentistry Dr. Kristin Bruins  Patient has Follow up directly following Hospitalization tomorrow 08/21/17 with Oral Surgery Dr. Barbette Merino at 1:00 pm    Procedures: None   Antimicrobials:  Anti-infectives (From admission, onward)   Start     Dose/Rate Route Frequency Ordered Stop   08/19/17 1130  amoxicillin-clavulanate (AUGMENTIN) 875-125 MG per tablet 1 tablet     1 tablet Oral Every 12 hours 08/19/17 1053     08/18/17 0900  penicillin v potassium (VEETID) tablet 500 mg  Status:  Discontinued     500 mg Oral Every 6 hours 08/18/17 0834 08/19/17 1053     Subjective: Seen and Examined bedside and was any worsening tooth pain as well as left-sided facial swelling.  Denies any nausea, vomiting, shortness of breath, chest pain but did spike a fever of 101 last night.  Also was tachycardic per report however improved.  No lightheadedness or dizziness.  Still appears uncomfortable.  No other concerns or complaints at this time  Objective: Vitals:   08/19/17 2108 08/20/17 0344 08/20/17 1253 08/20/17 1424  BP: 134/87 117/89 111/83 114/79  Pulse: (!) 135 80 82 77  Resp: 20 18  14  Temp: (!) 101 F (38.3 C) 98.3 F (36.8 C) 99 F (37.2 C) 99.4 F (37.4 C)  TempSrc: Oral Oral Oral Oral  SpO2: 98% 99%  99%  Weight:      Height:        Intake/Output Summary (Last 24 hours) at 08/20/2017 1508 Last data filed at 08/20/2017 0500 Gross per 24 hour  Intake 480 ml  Output -  Net 480 ml   Filed Weights   08/18/17 0243 08/18/17 0800  Weight: 70.3 kg (155 lb) 69.1 kg (152 lb 5.4 oz)   Examination: Physical Exam:  Constitutional: Thin Caucasian male currently complaining of severe pain in his tooth; looks uncomfortable  but appears calm Eyes: Sclera anicteric. Lids and conjunctive are normal ENMT: External ears and nose appear normal.  Grossly normal  hearing.  Has dental caries and some swelling of the left side of his face. Neck: Appears supple with no JVD Respiratory: Diminished to auscultation bilaterally with no appreciable wheezing, rales, rhonchi.  Unlabored breathing. Cardiovascular: Regular rate and rhythm; appreciable murmurs, rubs, gallops.  No lower extremity edema noted Abdomen: Soft, nontender, nondistended.  Bowel sounds present all 4 quadrants GU: Deferred Musculoskeletal: No contractures or cyanosis.  No joint deformities noted in upper extremities. Skin: Warm and dry with no appreciable rashes lesions onto skin evaluation. Neurologic: Grossly intact no appreciable focal deficit Psychiatric: Normal judgment intact.  Awake and alert and oriented x3.  Anxious and slightly agitated mood but appropriate affect  Data Reviewed: I have personally reviewed following labs and imaging studies  CBC: Recent Labs  Lab 08/18/17 0304 08/19/17 0913 08/20/17 0402  WBC 9.7 9.2 6.5  NEUTROABS 6.2 7.6 4.7  HGB 17.9* 15.6 16.4  HCT 50.2 45.5 48.0  MCV 95.1 96.4 94.9  PLT 208 177 163   Basic Metabolic Panel: Recent Labs  Lab 08/18/17 0304 08/19/17 0913 08/20/17 0402  NA 136 135 135  K 4.0 4.3 3.5  CL 104 102 98*  CO2 20* 26 30  GLUCOSE 105* 128* 144*  BUN 9 7 7   CREATININE 1.00 1.08 1.10  CALCIUM 8.7* 8.5* 8.5*  MG  --  1.9 2.0  PHOS  --  2.5 4.1   GFR: Estimated Creatinine Clearance: 78.5 mL/min (by C-G formula based on SCr of 1.1 mg/dL). Liver Function Tests: Recent Labs  Lab 08/19/17 0913 08/20/17 0402  AST 18 18  ALT 15* 13*  ALKPHOS 77 74  BILITOT 0.4 0.6  PROT 6.0* 6.1*  ALBUMIN 3.2* 3.3*   No results for input(s): LIPASE, AMYLASE in the last 168 hours. No results for input(s): AMMONIA in the last 168 hours. Coagulation Profile: No results for input(s): INR, PROTIME in the last 168 hours. Cardiac Enzymes: Recent Labs  Lab 08/18/17 0758 08/18/17 1209 08/18/17 2108  TROPONINI <0.03 <0.03 <0.03    BNP (last 3 results) No results for input(s): PROBNP in the last 8760 hours. HbA1C: No results for input(s): HGBA1C in the last 72 hours. CBG: No results for input(s): GLUCAP in the last 168 hours. Lipid Profile: No results for input(s): CHOL, HDL, LDLCALC, TRIG, CHOLHDL, LDLDIRECT in the last 72 hours. Thyroid Function Tests: No results for input(s): TSH, T4TOTAL, FREET4, T3FREE, THYROIDAB in the last 72 hours. Anemia Panel: No results for input(s): VITAMINB12, FOLATE, FERRITIN, TIBC, IRON, RETICCTPCT in the last 72 hours. Sepsis Labs: No results for input(s): PROCALCITON, LATICACIDVEN in the last 168 hours.  Recent Results (from the past 240 hour(s))  MRSA PCR Screening  Status: None   Collection Time: 08/18/17  9:17 AM  Result Value Ref Range Status   MRSA by PCR NEGATIVE NEGATIVE Final    Comment:        The GeneXpert MRSA Assay (FDA approved for NASAL specimens only), is one component of a comprehensive MRSA colonization surveillance program. It is not intended to diagnose MRSA infection nor to guide or monitor treatment for MRSA infections. Performed at [facility], [address], [place], [place] [address]     Radiology Studies: No results found. Scheduled Meds: . amLODipine  10 mg Oral Daily  . amoxicillin-clavulanate  1 tablet Oral Q12H  . aspirin  81 mg Oral Daily  . clopidogrel  75 mg Oral Daily  . enoxaparin (LOVENOX) injection  40 mg Subcutaneous Q24H  . metoprolol tartrate  25 mg Oral BID  . nicotine  14 mg Transdermal Daily  . rosuvastatin  10 mg Oral q1800   Continuous Infusions:   LOS: 0 days   Merlene Laughter, DO [facility] Pager [phone]  If 7PM-7AM, please contact night-coverage [email or web address] Password [email or web address] 08/20/2017, 3:08 PM

## 2017-08-20 NOTE — Consult Note (Signed)
DENTAL CONSULTATION  Date of Consultation:  08/20/2017 Patient Name:   Douglas [name] Date of Birth:   1965-04-21 Medical Record Number: [id number]  VITALS: BP 111/83 (BP Location: Left Arm)   Pulse 82   Temp 99 F (37.2 C) (Oral)   Resp 18   Ht 5\' 11"  (1.803 m)   Wt 152 lb 5.4 oz (69.1 kg)   SpO2 99%   BMI 21.25 kg/m   CHIEF COMPLAINT: Patient referred by Dr. Marland Mcalpine for dental consultation.  HPI: Kaisyn Millea Is a 52 year old male recently admitted with chest pain.  Patient subsequently indicated he also had lower left quadrant tooth pain. Dental consultation was requested to evaluate patient for a toothache.  Patient has a history of acute dental pain coming from tooth #20. Patient describes the pain as being sharp and constant in nature. Tooth has been hurting off and on for the past 2 months. Pain worsened over the last 3-4 days. Patient describes pain that  Reached an intensity of 10 out of 10 but is currently 8 out of 10 with pain medication. Patient last saw the dentist several years ago for dental extraction. This was with Dr. Nuala Alpha. Patient had a tooth extracted without complications. Patient indicates that he was not on Plavix therapy at that time.  Patient does not seek regular dental care due to economic concerns. Patient denies having dental phobia. Patient is interested in eventually having all remaining teeth extracted.  PROBLEM LIST: Patient Active Problem List   Diagnosis Date Noted  . Chest pain 08/18/2017  . Tooth abscess 08/18/2017  . Aortic atherosclerosis (HCC) 04/10/2017  . Patient's noncompliance with other medical treatment and regimen 04/10/2017  . CAD (coronary artery disease), native coronary artery 03/15/2016  . Pancreatic divisum 04/18/2015  . History of acute pancreatitis 04/06/2015  . Alcohol abuse 03/09/2015  . Fatty liver 03/09/2015  . Tobacco abuse     PMH: Past Medical History:  Diagnosis Date  . Anginal pain (HCC)   .  Coronary artery disease   . GERD (gastroesophageal reflux disease)   . History of acute pancreatitis 04/06/2015  . NSTEMI (non-ST elevated myocardial infarction) (HCC) 01/2014  . Pancreatitis   . PTSD (post-traumatic stress disorder)   . Tobacco abuse     PSH: Past Surgical History:  Procedure Laterality Date  . CARDIAC CATHETERIZATION N/A 03/14/2016   Procedure: Left Heart Cath and Coronary Angiography;  Surgeon: Kathleene Hazel, MD;  Location: [facility] INVASIVE CV LAB;  Service: Cardiovascular;  Laterality: N/A;  . CORONARY ANGIOPLASTY WITH STENT PLACEMENT  01/18/2014   "1"  . CORONARY ARTERY BYPASS GRAFT N/A 03/19/2016   Procedure: CORONARY ARTERY BYPASS GRAFTING (CABG), ON PUMP, TIMES FIVE, USING LEFT INTERNAL MAMMARY ARTERY AND RIGHT GREATER SAPHENOUS VEIN HARVESTED ENDOSCOPICALLY;  Surgeon: Delight Ovens, MD;  Location: [facility] OR;  Service: Open Heart Surgery;  Laterality: N/A;  LIMA-LAD SVG-OM SVG-DIAG SEQ SVG-PD-PL  . CORONARY BALLOON ANGIOPLASTY N/A 04/11/2017   Procedure: CORONARY BALLOON ANGIOPLASTY;  Surgeon: Yvonne Kendall, MD;  Location: [facility] INVASIVE CV LAB;  Service: Cardiovascular;  Laterality: N/A;  Distal RCA  . INTRAVASCULAR PRESSURE WIRE/FFR STUDY N/A 04/11/2017   Procedure: INTRAVASCULAR PRESSURE WIRE/FFR STUDY;  Surgeon: Yvonne Kendall, MD;  Location: [facility] INVASIVE CV LAB;  Service: Cardiovascular;  Laterality: N/A;  . LEFT HEART CATH AND CORS/GRAFTS ANGIOGRAPHY N/A 04/11/2017   Procedure: LEFT HEART CATH AND CORS/GRAFTS ANGIOGRAPHY;  Surgeon: Yvonne Kendall, MD;  Location: [facility] INVASIVE CV LAB;  Service: Cardiovascular;  Laterality: N/A;  .  LEFT HEART CATHETERIZATION WITH CORONARY ANGIOGRAM N/A 01/18/2014   Procedure: LEFT HEART CATHETERIZATION WITH CORONARY ANGIOGRAM;  Surgeon: Runell Gess, MD;  Location: [facility] CATH LAB;  Service: Cardiovascular;  Laterality: N/A;  . TEE WITHOUT CARDIOVERSION N/A 03/19/2016   Procedure: TRANSESOPHAGEAL ECHOCARDIOGRAM (TEE);  Surgeon:  Delight Ovens, MD;  Location: [facility] OR;  Service: Open Heart Surgery;  Laterality: N/A;    ALLERGIES: Allergies  Allergen Reactions  . Ibuprofen Nausea And Vomiting    UPSETS STOMACH  . Shellfish Allergy Nausea And Vomiting    MEDICATIONS: Current Facility-Administered Medications  Medication Dose Route Frequency Provider Last Rate Last Dose  . amLODipine (NORVASC) tablet 10 mg  10 mg Oral Daily Marguerita Merles Bruno, DO   10 mg at 08/20/17 1243  . amoxicillin-clavulanate (AUGMENTIN) 875-125 MG per tablet 1 tablet  1 tablet Oral Q12H Marguerita Merles Covington, DO   1 tablet at 08/20/17 1243  . aspirin chewable tablet 81 mg  81 mg Oral Daily Croitoru, Mihai, MD   81 mg at 08/20/17 1243  . benzocaine (ORAJEL) 10 % mucosal gel   Mouth/Throat QID PRN Marguerita Merles Latif, DO      . clopidogrel (PLAVIX) tablet 75 mg  75 mg Oral Daily Croitoru, Mihai, MD   75 mg at 08/20/17 1243  . enoxaparin (LOVENOX) injection 40 mg  40 mg Subcutaneous Q24H Burnadette Pop, MD   40 mg at 08/19/17 2215  . hydrALAZINE (APRESOLINE) injection 5 mg  5 mg Intravenous Q6H PRN Sheikh, Kateri Mc Latif, DO      . ibuprofen (ADVIL,MOTRIN) tablet 200 mg  200 mg Oral Q8H PRN Burnadette Pop, MD   200 mg at 08/19/17 2215  . metoprolol tartrate (LOPRESSOR) tablet 25 mg  25 mg Oral BID Croitoru, Mihai, MD   25 mg at 08/20/17 1244  . morphine 2 MG/ML injection 2 mg  2 mg Intravenous Q4H PRN Burnadette Pop, MD   2 mg at 08/20/17 1240  . nicotine (NICODERM CQ - dosed in mg/24 hours) patch 14 mg  14 mg Transdermal Daily Croitoru, Mihai, MD   14 mg at 08/20/17 1246  . nitroGLYCERIN (NITROSTAT) SL tablet 0.4 mg  0.4 mg Sublingual Q5 min PRN Graciella Freer A, PA-C   0.4 mg at 08/18/17 1641  . rosuvastatin (CRESTOR) tablet 10 mg  10 mg Oral q1800 Croitoru, Mihai, MD   10 mg at 08/19/17 1812    LABS: Lab Results  Component Value Date   WBC 6.5 08/20/2017   HGB 16.4 08/20/2017   HCT 48.0 08/20/2017   MCV 94.9 08/20/2017   PLT 163  08/20/2017      Component Value Date/Time   NA 135 08/20/2017 0402   K 3.5 08/20/2017 0402   CL 98 (L) 08/20/2017 0402   CO2 30 08/20/2017 0402   GLUCOSE 144 (H) 08/20/2017 0402   BUN 7 08/20/2017 0402   CREATININE 1.10 08/20/2017 0402   CALCIUM 8.5 (L) 08/20/2017 0402   GFRNONAA >60 08/20/2017 0402   GFRAA >60 08/20/2017 0402   Lab Results  Component Value Date   INR 0.88 04/11/2017   INR 1.18 03/19/2016   INR 0.99 03/14/2016   No results found for: PTT  SOCIAL HISTORY: Social History   Socioeconomic History  . Marital status: Divorced    Spouse name: Not on file  . Number of children: 2  . Years of education: Not on file  . Highest education level: Not on file  Occupational History  . Occupation: Statistician  Social Needs  . Financial resource strain: Not on file  . Food insecurity:    Worry: Not on file    Inability: Not on file  . Transportation needs:    Medical: Not on file    Non-medical: Not on file  Tobacco Use  . Smoking status: Current Every Day Smoker    Packs/day: 1.00    Years: 31.00    Pack years: 31.00    Types: Cigarettes  . Smokeless tobacco: Never Used  Substance and Sexual Activity  . Alcohol use: Yes    Alcohol/week: 0.6 oz    Types: 1 Cans of beer per week    Comment: occ  . Drug use: No    Types: Marijuana    Comment: denies 06/17/2015  . Sexual activity: Not Currently  Lifestyle  . Physical activity:    Days per week: Not on file    Minutes per session: Not on file  . Stress: Not on file  Relationships  . Social connections:    Talks on phone: Not on file    Gets together: Not on file    Attends religious service: Not on file    Active member of club or organization: Not on file    Attends meetings of clubs or organizations: Not on file    Relationship status: Not on file  . Intimate partner violence:    Fear of current or ex partner: Not on file    Emotionally abused: Not on file    Physically abused: Not on file    Forced  sexual activity: Not on file  Other Topics Concern  . Not on file  Social History Narrative   Divorced.  2 sons.  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    FAMILY HISTORY: Family History  Problem Relation Age of Onset  . Cancer Mother        Lung cancer  . Coronary artery disease Mother   . Heart failure Maternal Grandmother     REVIEW OF SYSTEMS: Reviewed with the patient as per History of present illness. Psych: Patient denies having dental phobia.  DENTAL HISTORY: CHIEF COMPLAINT: Patient referred by Dr. Marland Mcalpine for dental consultation.  HPI: Douglas [name] Is a 52 year old male recently admitted with chest pain.  Patient subsequently indicated he also had lower left quadrant tooth pain. Dental consultation was requested to evaluate patient for a toothache.  Patient has a history of acute dental pain coming from tooth #20. Patient describes the pain as being sharp and constant in nature. Tooth has been hurting off and on for the past 2 months. Pain worsened over the last 3-4 days. Patient describes pain that  Reached an intensity of 10 out of 10 but is currently 8 out of 10 with pain medication. Patient last saw the dentist several years ago for dental extraction. This was with Dr. Nuala Alpha. Patient had a tooth extracted without complications. Patient indicates that he was not on Plavix therapy at that time.  Patient does not seek regular dental care due to economic concerns. Patient denies having dental phobia. Patient is interested in eventually having all remaining teeth extracted.  DENTAL EXAMINATION: GENERAL: The patient is a well-developed, well-nourished male in no acute distress. HEAD AND NECK:  There is no palpable neck lymphadenopathy. The patient denies acute TMJ symptoms.patient may have some facial welling on the left side in the area of the premolar. INTRAORAL EXAM:  Patient has incipient xerostomia. There may  be some slight buccal  swelling in the area of tooth #20. There are areas of leukoplakia involving #17 through 19 and 30-32 consistent with chewing in that area from the upper occlusion. DENTITION:  The patient is missing tooth numbers 1, 3, 11, 16, 17, 18, 19, 30, 31, and 32.There are retained root segments in the area tooth numbers 5 through 10, and 12. PERIODONTAL:  The patient has chronic periodontitis with plaque and calculus accumulations, generalized gingival recession, and tooth mobility. There is incipient to moderate bone loss noted. DENTAL CARIES/SUBOPTIMAL RESTORATIONS:  Multiple dental caries are noted as per dental charting form. ENDODONTIC:  Patient has a history of acute pulpitis symptoms coming from tooth #20. There is periapical pathology and radiolucency at the apex of tooth #20. CROWN AND BRIDGE:  There are no crown or bridge restorations. PROSTHODONTIC: The patient denies having partial dentures. OCCLUSION:  Patient has a poor occlusal scheme secondary to multiple missing teeth, multiple retained root segments, and lack of replacement of missing teeth with dental prostheses.  RADIOGRAPHIC INTERPRETATION: Orthopantogram was taken today in the department of dental medicine. There are multiple missing teeth. There are multiple retained root segments. There are multiple dental caries noted. There is periapical pathology and radiolucency at the apex of tooth #20. There is supra-eruption and drifting of the unopposed teeth into the edentulous areas.   ASSESSMENTS: 1. History of chest pain and reason for this admission 2. History of acute pulpitis symptoms from tooth #20 3. Chronic apical periodontitis 4. Dental caries 5. Multiple retained root segments 6. Multiple missing teeth 7. Supra-eruption and drifting of the unopposed teeth into the edentulous areas 8. Chronic periodontitis with bone loss 9. Accretions 10. Gingival recession 11. Loose teeth 12. Poor occlusal scheme and malocclusion 13. Risk  for bleeding with invasive dental procedures due to current Plavix and Lovenox therapies   PLAN/RECOMMENDATIONS: 1. I discussed the risks, benefits, and complications of various treatment options with the patient in relationship to his medical and dental conditions. We discussed various treatment options to include no treatment, total and subtotal extractions with alveoloplasty, pre-prosthetic surgery as indicated, periodontal therapy, dental restorations, root canal therapy, crown and bridge therapy, implant therapy, and replacement of missing teeth as indicated. The patient currently wishes to proceed with having tooth #20 extracted.  I will be out of the office for the next 6-7 days and Dr.Sheikh will coordinate treatment with Dr. Ocie DoyneScott Jensen, oral surgeon, for extraction of tooth #20 once the patient is discharged. Dr. Barbette MerinoJensen will consider discontinuation of the Plavix therapy as indicated.  The patient will then need to follow-up with a dentist of his choice to be considered for extraction of remaining teeth with alveoloplasty and subsequent fabrication of upper and lower complete dentures after adequate healing. Patient was encouraged to contact Social Services concerning obtaining dental Medicaid. The patient has been informed of the need to find a new primary dentist to assist in provision of comprehensive dental care as indicated.  2. Discussion of findings with medical team and coordination of future medical and dental care as needed.    Charlynne Panderonald F. Somya Jauregui, DDS

## 2017-08-20 NOTE — Progress Notes (Signed)
Subjective:  Feeling better today with less chest discomfort.  Still with some dental pain and had temperature last night.  Plan in place to the dental abscess taken care of.  Objective:  Vital Signs in the last 24 hours: BP 114/79 (BP Location: Left Arm)   Pulse 77   Temp 99.4 F (37.4 C) (Oral)   Resp 14   Ht 5\' 11"  (1.803 m)   Wt 69.1 kg (152 lb 5.4 oz)   SpO2 99%   BMI 21.25 kg/m   Physical Exam: Thin white male in no acute distress Lungs:  Clear Cardiac:  Regular rhythm, normal S1 and S2, no S3 Extremities:  No edema present  Intake/Output from previous day: 06/10 0701 - 06/11 0700 In: 480 [P.O.:480] Out: -   Weight Filed Weights   08/18/17 0243 08/18/17 0800  Weight: 70.3 kg (155 lb) 69.1 kg (152 lb 5.4 oz)    Lab Results: Basic Metabolic Panel: Recent Labs    08/19/17 0913 08/20/17 0402  NA 135 135  K 4.3 3.5  CL 102 98*  CO2 26 30  GLUCOSE 128* 144*  BUN 7 7  CREATININE 1.08 1.10   CBC: Recent Labs    08/19/17 0913 08/20/17 0402  WBC 9.2 6.5  NEUTROABS 7.6 4.7  HGB 15.6 16.4  HCT 45.5 48.0  MCV 96.4 94.9  PLT 177 163   Cardiac Enzymes: Troponin (Point of Care Test) Recent Labs    08/18/17 0317  TROPIPOC 0.00   Cardiac Panel (last 3 results) Recent Labs    08/18/17 0758 08/18/17 1209 08/18/17 2108  TROPONINI <0.03 <0.03 <0.03    Telemetry: Sinus rhythm  Assessment/Plan:  1.  I doubt this is cardiac type chest pain 2.  Hypertension better controlled 3.  CAD with previous bypass grafting and stenting  Recommendations:  At this point his blood pressure is better controlled and the cardiac status has settled down.  He is supposed to see an oral surgeon to get the dental abscess taking care of.  I will see back for follow-up in the office.he is applying for Medicaid.  Please call if further questions.     [name]. Spencer Tilley, [name]  MD [facility]FACC Cardiology  08/20/2017, 5:35 PM

## 2017-08-21 DIAGNOSIS — Z720 Tobacco use: Secondary | ICD-10-CM

## 2017-08-21 DIAGNOSIS — K047 Periapical abscess without sinus: Principal | ICD-10-CM

## 2017-08-21 DIAGNOSIS — I25118 Atherosclerotic heart disease of native coronary artery with other forms of angina pectoris: Secondary | ICD-10-CM

## 2017-08-21 DIAGNOSIS — R079 Chest pain, unspecified: Secondary | ICD-10-CM

## 2017-08-21 LAB — COMPREHENSIVE METABOLIC PANEL
ALBUMIN: 2.8 g/dL — AB (ref 3.5–5.0)
ALK PHOS: 62 U/L (ref 38–126)
ALT: 14 U/L — ABNORMAL LOW (ref 17–63)
AST: 18 U/L (ref 15–41)
Anion gap: 7 (ref 5–15)
BUN: 7 mg/dL (ref 6–20)
CHLORIDE: 105 mmol/L (ref 101–111)
CO2: 28 mmol/L (ref 22–32)
Calcium: 8.1 mg/dL — ABNORMAL LOW (ref 8.9–10.3)
Creatinine, Ser: 0.93 mg/dL (ref 0.61–1.24)
GFR calc non Af Amer: >60 mL/min (ref >60)
GLUCOSE: 114 mg/dL — AB (ref 65–99)
POTASSIUM: 3.7 mmol/L (ref 3.5–5.1)
SODIUM: 140 mmol/L (ref 135–145)
Total Bilirubin: 0.3 mg/dL (ref 0.3–1.2)
Total Protein: 5.4 g/dL — ABNORMAL LOW (ref 6.5–8.1)

## 2017-08-21 LAB — HEMOGLOBIN A1C
HEMOGLOBIN A1C: 5.9 % — AB (ref 4.8–5.6)
MEAN PLASMA GLUCOSE: 122.63 mg/dL

## 2017-08-21 LAB — CBC WITH DIFFERENTIAL/PLATELET
BASOS PCT: 1 %
Basophils Absolute: 0 10*3/uL (ref 0.0–0.1)
EOS ABS: 0.1 10*3/uL (ref 0.0–0.7)
EOS PCT: 2 %
HCT: 45.1 % (ref 39.0–52.0)
HEMOGLOBIN: 15.6 g/dL (ref 13.0–17.0)
Lymphocytes Relative: 28 %
Lymphs Abs: 1.8 10*3/uL (ref 0.7–4.0)
MCH: 32.9 pg (ref 26.0–34.0)
MCHC: 34.6 g/dL (ref 30.0–36.0)
MCV: 95.1 fL (ref 78.0–100.0)
Monocytes Absolute: 0.8 10*3/uL (ref 0.1–1.0)
Monocytes Relative: 13 %
NEUTROS PCT: 56 %
Neutro Abs: 3.6 10*3/uL (ref 1.7–7.7)
PLATELETS: 162 10*3/uL (ref 150–400)
RBC: 4.74 MIL/uL (ref 4.22–5.81)
RDW: 13 % (ref 11.5–15.5)
WBC: 6.3 10*3/uL (ref 4.0–10.5)

## 2017-08-21 LAB — PHOSPHORUS: PHOSPHORUS: 3.7 mg/dL (ref 2.5–4.6)

## 2017-08-21 LAB — MAGNESIUM: Magnesium: 2.1 mg/dL (ref 1.7–2.4)

## 2017-08-21 MED ORDER — AMLODIPINE BESYLATE 10 MG PO TABS: 10.0000 mg | ORAL_TABLET | Freq: Every day | ORAL | 0 refills | Status: DC

## 2017-08-21 MED ORDER — METOPROLOL TARTRATE 25 MG PO TABS: 25.0000 mg | ORAL_TABLET | Freq: Two times a day (BID) | ORAL | 0 refills | Status: DC

## 2017-08-21 MED ORDER — AMOXICILLIN-POT CLAVULANATE 875-125 MG PO TABS: 1.0000 | ORAL_TABLET | Freq: Two times a day (BID) | ORAL | 0 refills | Status: DC

## 2017-08-21 NOTE — Progress Notes (Signed)
Appointment confirmed with Dr. Ocie DoyneScott Jensen" dentist office for 1 PM today. Pt also encouraged to call [facility] an [facility].

## 2017-08-21 NOTE — Discharge Summary (Signed)
Physician Discharge Summary  Patient ID: Gilad Dugger MRN: [id number] DOB/AGE: 1965/05/25 52 y.o.  Admit date: 08/18/2017 Discharge date: 08/21/2017  Admission Diagnoses:  Discharge Diagnoses:  Principal Problem:   Chest pain Active Problems:   Tobacco abuse   Alcohol abuse   CAD (coronary artery disease), native coronary artery   Patient's noncompliance with other medical treatment and regimen   Tooth abscess   Discharged Condition: stable  Hospital Course: Patient is a 52 year old Caucasian male with past medical history significant forcoronary artery disease, status post stents to right coronary artery, multivessel disease status post CABG in 2018, balloon angioplasty.  Patient was admitted with chest pain. Patient's chest pain was atypical.  Troponins came back negative.  Cardiology team directed management of the chest pain.  Cardiology team advised optimization of patient's blood pressure, and treatment of the patient's dental problems.  Patient will follow with the cardiology team on discharge. Patient also had toothache and severe dental caries.  Patient was started on Augmentin during the hospital stay.  Patient will follow with the dentist immediately upon discharge.  Patient already has an appointment to see the dentist today at 12 noon.  Patient's chest pain has resolved.  Blood pressure has been optimized.  Patient will be discharged back home to the care of the primary care provider, cardiology team and the dentist.  During the hospital stay, patient developed fever.  Fever has also resolved, following commencement of Augmentin.   Consults: cardiology.  Significant Diagnostic Studies: labs: Negative cardiac enzymes.  Discharge Exam: Blood pressure 101/70, pulse 65, temperature 98 F (36.7 C), temperature source Oral, resp. rate 18, height 5\' 11"  (1.803 m), weight 69.1 kg (152 lb 5.4 oz), SpO2 98 %.   Disposition: Discharge disposition: 01-Home or Self  Care  Discharge Instructions    Call MD for:   Complete by:  As directed    Call MD for worsening of symptoms   Diet - low sodium heart healthy   Complete by:  As directed    Increase activity slowly   Complete by:  As directed      Allergies as of 08/21/2017      Reactions   Ibuprofen Nausea And Vomiting   UPSETS STOMACH   Shellfish Allergy Nausea And Vomiting      Medication List    STOP taking these medications   acetaminophen 325 MG tablet Commonly known as:  TYLENOL   BC HEADACHE POWDER PO   penicillin v potassium 500 MG tablet Commonly known as:  VEETID     TAKE these medications   amLODipine 10 MG tablet Commonly known as:  NORVASC Take 1 tablet (10 mg total) by mouth daily. Start taking on:  08/22/2017   amoxicillin-clavulanate 875-125 MG tablet Commonly known as:  AUGMENTIN Take 1 tablet by mouth every 12 (twelve) hours.   aspirin 81 MG chewable tablet Chew 1 tablet (81 mg total) by mouth daily.   clopidogrel 75 MG tablet Commonly known as:  PLAVIX Take 1 tablet (75 mg total) by mouth daily with breakfast.   metoprolol tartrate 25 MG tablet Commonly known as:  LOPRESSOR Take 1 tablet (25 mg total) by mouth 2 (two) times daily.   nicotine 21 mg/24hr patch Commonly known as:  NICODERM CQ - dosed in mg/24 hours Place 21 mg onto the skin daily.   nitroGLYCERIN 0.4 MG SL tablet Commonly known as:  NITROSTAT Place 1 tablet (0.4 mg total) under the tongue every 5 (five) minutes as needed for chest  pain.   rosuvastatin 10 MG tablet Commonly known as:  CRESTOR Take 1 tablet (10 mg total) by mouth daily at 6 PM.      Follow-up Information    [facility] Follow up on 08/26/2017.   Specialty:  Internal Medicine Why:  Please call Monday 6/17 for a hospital follow up appointment.  Contact information: [address] [place] [id number] [phone]       Ocie DoyneJensen, Scott, DDS Follow up on 08/21/2017.   Specialty:   Oral Surgery Why:  Appointment at 1:00 PM Contact information: [address] [place] [address] [phone]           Signed: Barnetta ChapelSylvester I Cabell Lazenby 08/21/2017, 11:23 AM

## 2017-08-21 NOTE — Progress Notes (Signed)
Patient is being discharged home. Discharge instructions were given to patient and family 

## 2017-08-25 LAB — CULTURE, BLOOD (ROUTINE X 2)
CULTURE: NO GROWTH
Culture: NO GROWTH
SPECIAL REQUESTS: ADEQUATE
Special Requests: ADEQUATE

## 2017-09-12 ENCOUNTER — Other Ambulatory Visit: Payer: Self-pay

## 2017-09-12 ENCOUNTER — Emergency Department (HOSPITAL_COMMUNITY): Payer: Self-pay

## 2017-09-12 ENCOUNTER — Emergency Department (HOSPITAL_COMMUNITY)
Admission: EM | Admit: 2017-09-12 | Discharge: 2017-09-13 | Disposition: A | Discharge status: Home / Self Care | Payer: Self-pay | Attending: Emergency Medicine | Admitting: Emergency Medicine

## 2017-09-12 ENCOUNTER — Encounter (HOSPITAL_COMMUNITY): Payer: Self-pay | Admitting: Emergency Medicine

## 2017-09-12 DIAGNOSIS — R202 Paresthesia of skin: Secondary | ICD-10-CM | POA: Insufficient documentation

## 2017-09-12 DIAGNOSIS — Z7902 Long term (current) use of antithrombotics/antiplatelets: Secondary | ICD-10-CM | POA: Insufficient documentation

## 2017-09-12 DIAGNOSIS — F17210 Nicotine dependence, cigarettes, uncomplicated: Secondary | ICD-10-CM | POA: Insufficient documentation

## 2017-09-12 DIAGNOSIS — M79662 Pain in left lower leg: Secondary | ICD-10-CM | POA: Insufficient documentation

## 2017-09-12 DIAGNOSIS — M79605 Pain in left leg: Secondary | ICD-10-CM

## 2017-09-12 DIAGNOSIS — Z7982 Long term (current) use of aspirin: Secondary | ICD-10-CM | POA: Insufficient documentation

## 2017-09-12 DIAGNOSIS — I2510 Atherosclerotic heart disease of native coronary artery without angina pectoris: Secondary | ICD-10-CM | POA: Insufficient documentation

## 2017-09-12 LAB — I-STAT TROPONIN, ED: TROPONIN I, POC: 0 ng/mL (ref 0.00–0.08)

## 2017-09-12 NOTE — ED Triage Notes (Signed)
Pt from home. States the past month having pain in his entire left leg with walking.  Pt also states that about an hour ago began having tingling in his left arm. History of CABG in 2017. STates he was told then that the veins in his left leg were not suitable for harvest so wonders if this is a vascular issue he is having with his left leg.

## 2017-09-13 ENCOUNTER — Emergency Department (HOSPITAL_COMMUNITY): Payer: Self-pay

## 2017-09-13 ENCOUNTER — Ambulatory Visit (HOSPITAL_COMMUNITY): Admission: RE | Admit: 2017-09-13 | Payer: Self-pay | Source: Ambulatory Visit

## 2017-09-13 ENCOUNTER — Encounter (HOSPITAL_COMMUNITY): Payer: Self-pay | Admitting: Radiology

## 2017-09-13 LAB — BASIC METABOLIC PANEL
Anion gap: 11 (ref 5–15)
BUN: 5 mg/dL — ABNORMAL LOW (ref 6–20)
CHLORIDE: 102 mmol/L (ref 98–111)
CO2: 22 mmol/L (ref 22–32)
CREATININE: 1.18 mg/dL (ref 0.61–1.24)
Calcium: 8.7 mg/dL — ABNORMAL LOW (ref 8.9–10.3)
GFR calc Af Amer: >60 mL/min (ref >60)
GFR calc non Af Amer: >60 mL/min (ref >60)
GLUCOSE: 93 mg/dL (ref 70–99)
Potassium: 4 mmol/L (ref 3.5–5.1)
Sodium: 135 mmol/L (ref 135–145)

## 2017-09-13 LAB — CBC
HCT: 51.1 % (ref 39.0–52.0)
HEMOGLOBIN: 17.3 g/dL — AB (ref 13.0–17.0)
MCH: 31.7 pg (ref 26.0–34.0)
MCHC: 33.9 g/dL (ref 30.0–36.0)
MCV: 93.8 fL (ref 78.0–100.0)
PLATELETS: 212 10*3/uL (ref 150–400)
RBC: 5.45 MIL/uL (ref 4.22–5.81)
RDW: 13.2 % (ref 11.5–15.5)
WBC: 8.9 10*3/uL (ref 4.0–10.5)

## 2017-09-13 LAB — RAPID URINE DRUG SCREEN, HOSP PERFORMED
Amphetamines: NOT DETECTED
BENZODIAZEPINES: NOT DETECTED
Cocaine: NOT DETECTED
Opiates: NOT DETECTED
Tetrahydrocannabinol: NOT DETECTED

## 2017-09-13 MED ORDER — KETOROLAC TROMETHAMINE 30 MG/ML IJ SOLN
30.0000 mg | Freq: Once | INTRAMUSCULAR | Status: AC
  Administered 2017-09-13: 30 mg via INTRAVENOUS
  Filled 2017-09-13: qty 1

## 2017-09-13 MED ORDER — DICLOFENAC SODIUM 1 % TD GEL: 4.0000 g | Freq: Four times a day (QID) | TRANSDERMAL | 0 refills | Status: DC

## 2017-09-13 NOTE — ED Notes (Signed)
ED Provider at bedside. 

## 2017-09-13 NOTE — ED Notes (Signed)
Patient transported to MRI 

## 2017-09-13 NOTE — ED Provider Notes (Addendum)
[facility] EMERGENCY DEPARTMENT Provider Note   CSN: [id number] Arrival date & time: 09/12/17  2304     History   Chief Complaint Chief Complaint  Patient presents with  . Extremity Weakness    HPI Douglas [name] is a 52 y.o. male.  The history is provided by the patient. No language interpreter was used.  Leg Pain   This is a new problem. The current episode started more than 1 week ago (1 month). The problem occurs constantly. The problem has not changed since onset.The pain is present in the left lower leg. The quality of the pain is described as aching. The pain is at a severity of 5/10. The pain is moderate. Associated symptoms include numbness. Pertinent negatives include full range of motion, no tingling and no itching. The symptoms are aggravated by standing and activity. He has tried nothing for the symptoms. The treatment provided no relief. There has been no history of extremity trauma. Family history is significant for no rheumatoid arthritis.  Also having tingling of the LUE.  No CP no SOB.  Has not told his cardiologist or PMD.    Past Medical History:  Diagnosis Date  . Anginal pain (HCC)   . Coronary artery disease   . GERD (gastroesophageal reflux disease)   . History of acute pancreatitis 04/06/2015  . NSTEMI (non-ST elevated myocardial infarction) (HCC) 01/2014  . Pancreatitis   . PTSD (post-traumatic stress disorder)   . Tobacco abuse     Patient Active Problem List   Diagnosis Date Noted  . Chest pain 08/18/2017  . Tooth abscess 08/18/2017  . Aortic atherosclerosis (HCC) 04/10/2017  . Patient's noncompliance with other medical treatment and regimen 04/10/2017  . CAD (coronary artery disease), native coronary artery 03/15/2016  . Pancreatic divisum 04/18/2015  . History of acute pancreatitis 04/06/2015  . Alcohol abuse 03/09/2015  . Fatty liver 03/09/2015  . Tobacco abuse     Past Surgical History:  Procedure Laterality Date    . CARDIAC CATHETERIZATION N/A 03/14/2016   Procedure: Left Heart Cath and Coronary Angiography;  Surgeon: Kathleene Hazel, MD;  Location: [facility] INVASIVE CV LAB;  Service: Cardiovascular;  Laterality: N/A;  . CORONARY ANGIOPLASTY WITH STENT PLACEMENT  01/18/2014   "1"  . CORONARY ARTERY BYPASS GRAFT N/A 03/19/2016   Procedure: CORONARY ARTERY BYPASS GRAFTING (CABG), ON PUMP, TIMES FIVE, USING LEFT INTERNAL MAMMARY ARTERY AND RIGHT GREATER SAPHENOUS VEIN HARVESTED ENDOSCOPICALLY;  Surgeon: Delight Ovens, MD;  Location: [facility] OR;  Service: Open Heart Surgery;  Laterality: N/A;  LIMA-LAD SVG-OM SVG-DIAG SEQ SVG-PD-PL  . CORONARY BALLOON ANGIOPLASTY N/A 04/11/2017   Procedure: CORONARY BALLOON ANGIOPLASTY;  Surgeon: Yvonne Kendall, MD;  Location: [facility] INVASIVE CV LAB;  Service: Cardiovascular;  Laterality: N/A;  Distal RCA  . INTRAVASCULAR PRESSURE WIRE/FFR STUDY N/A 04/11/2017   Procedure: INTRAVASCULAR PRESSURE WIRE/FFR STUDY;  Surgeon: Yvonne Kendall, MD;  Location: [facility] INVASIVE CV LAB;  Service: Cardiovascular;  Laterality: N/A;  . LEFT HEART CATH AND CORS/GRAFTS ANGIOGRAPHY N/A 04/11/2017   Procedure: LEFT HEART CATH AND CORS/GRAFTS ANGIOGRAPHY;  Surgeon: Yvonne Kendall, MD;  Location: [facility] INVASIVE CV LAB;  Service: Cardiovascular;  Laterality: N/A;  . LEFT HEART CATHETERIZATION WITH CORONARY ANGIOGRAM N/A 01/18/2014   Procedure: LEFT HEART CATHETERIZATION WITH CORONARY ANGIOGRAM;  Surgeon: Runell Gess, MD;  Location: [facility] CATH LAB;  Service: Cardiovascular;  Laterality: N/A;  . TEE WITHOUT CARDIOVERSION N/A 03/19/2016   Procedure: TRANSESOPHAGEAL ECHOCARDIOGRAM (TEE);  Surgeon: Delight Ovens, MD;  Location: [facility] OR;  Service: Open Heart Surgery;  Laterality: N/A;        Home Medications    Prior to Admission medications   Medication Sig Start Date End Date Taking? Authorizing Provider  amLODipine (NORVASC) 10 MG tablet Take 1 tablet (10 mg total) by mouth daily. 08/22/17   Barnetta Chapelgbata,  Sylvester I, MD  amoxicillin-clavulanate (AUGMENTIN) 875-125 MG tablet Take 1 tablet by mouth every 12 (twelve) hours. 08/21/17   Barnetta Chapelgbata, Sylvester I, MD  aspirin 81 MG chewable tablet Chew 1 tablet (81 mg total) by mouth daily. 04/13/17   Lonia BloodMcClung, Jeffrey T, MD  clopidogrel (PLAVIX) 75 MG tablet Take 1 tablet (75 mg total) by mouth daily with breakfast. Patient not taking: Reported on 08/18/2017 04/13/17   Lonia BloodMcClung, Jeffrey T, MD  diclofenac sodium (VOLTAREN) 1 % GEL Apply 4 g topically 4 (four) times daily. 09/13/17   Barbi Kumagai, MD  metoprolol tartrate (LOPRESSOR) 25 MG tablet Take 1 tablet (25 mg total) by mouth 2 (two) times daily. 08/21/17   Berton Mountgbata, Sylvester I, MD  nicotine (NICODERM CQ - DOSED IN MG/24 HOURS) 21 mg/24hr patch Place 21 mg onto the skin daily.    [provider]  nitroGLYCERIN (NITROSTAT) 0.4 MG SL tablet Place 1 tablet (0.4 mg total) under the tongue every 5 (five) minutes as needed for chest pain. Patient not taking: Reported on 08/18/2017 04/12/17   Lonia BloodMcClung, Jeffrey T, MD  rosuvastatin (CRESTOR) 10 MG tablet Take 1 tablet (10 mg total) by mouth daily at 6 PM. Patient not taking: Reported on 08/18/2017 04/12/17   Lonia BloodMcClung, Jeffrey T, MD    Family History Family History  Problem Relation Age of Onset  . Cancer Mother        Lung cancer  . Coronary artery disease Mother   . Heart failure Maternal Grandmother     Social History Social History   Tobacco Use  . Smoking status: Current Every Day Smoker    Packs/day: 1.00    Years: 31.00    Pack years: 31.00    Types: Cigarettes  . Smokeless tobacco: Never Used  Substance Use Topics  . Alcohol use: Yes    Alcohol/week: 0.6 oz    Types: 1 Cans of beer per week    Comment: occ  . Drug use: No    Types: Marijuana    Comment: denies 06/17/2015     Allergies   Ibuprofen and Shellfish allergy   Review of Systems Review of Systems  Constitutional: Negative for diaphoresis and fever.  HENT: Negative for congestion.    Respiratory: Negative for choking, chest tightness and shortness of breath.   Cardiovascular: Negative for chest pain, palpitations and leg swelling.  Musculoskeletal: Negative for back pain.  Skin: Negative for itching.  Neurological: Positive for numbness. Negative for tingling, seizures, facial asymmetry, speech difficulty, weakness, light-headedness and headaches.  All other systems reviewed and are negative.    Physical Exam Updated Vital Signs BP (!) 148/88   Pulse 93   Temp 98.9 F (37.2 C) (Oral)   Resp 20   SpO2 98%   Physical Exam  Constitutional: He is oriented to person, place, and time. He appears well-developed and well-nourished. No distress.  HENT:  Head: Normocephalic and atraumatic.  Mouth/Throat: No oropharyngeal exudate.  Eyes: Pupils are equal, round, and reactive to light. Conjunctivae are normal.  Neck: Normal range of motion. Neck supple. No JVD present.  Cardiovascular: Normal rate, regular rhythm, normal heart sounds and intact distal pulses.  Pulmonary/Chest: Effort normal and breath sounds normal. No stridor. He has no wheezes. He has no rales.  Abdominal: Soft. Bowel sounds are normal. He exhibits no mass. There is no tenderness. There is no rebound and no guarding.  Musculoskeletal: Normal range of motion.  3 + dorsalis pedis of the LLE, leg is warm and NVI   LUE sensation is intact to confrontation   LUE/LLE 5/5 strength  Neurological: He is alert and oriented to person, place, and time. He displays normal reflexes.  Skin: Skin is warm and dry. Capillary refill takes less than 2 seconds.  Psychiatric: He has a normal mood and affect.     ED Treatments / Results  Labs (all labs ordered are listed, but only abnormal results are displayed) Labs Reviewed  BASIC METABOLIC PANEL - Abnormal; Notable for the following components:      Result Value   BUN 5 (*)    Calcium 8.7 (*)    All other components within normal limits  CBC - Abnormal;  Notable for the following components:   Hemoglobin 17.3 (*)    All other components within normal limits  RAPID URINE DRUG SCREEN, HOSP PERFORMED  I-STAT TROPONIN, ED    EKG EKG Interpretation  Date/Time:  Friday September 13 2017 01:28:21 EDT Ventricular Rate:  99 PR Interval:  140 QRS Duration: 101 QT Interval:  366 QTC Calculation: 470 R Axis:   83 Text Interpretation:  Sinus rhythm Confirmed by Nicanor Alcon, Elmon Shader ([id number]) on 09/13/2017 3:00:59 AM   Radiology Dg Chest 2 View  Result Date: 09/12/2017 CLINICAL DATA:  Weakness. EXAM: CHEST - 2 VIEW COMPARISON:  08/18/2017 FINDINGS: Postoperative changes in the mediastinum. Hyperinflation likely indicating emphysema. Lungs are clear. No airspace disease or consolidation. No blunting of costophrenic angles. No pneumothorax. Mediastinal contours appear intact. Tiny nodular opacities over the mid lungs likely representing prominent nipple shadows. Old left rib fractures. IMPRESSION: Emphysematous changes in the lungs. No evidence of active pulmonary disease. Electronically Signed   By: Burman Nieves M.D.   On: 09/12/2017 23:56   Ct Head Wo Contrast  Result Date: 09/13/2017 CLINICAL DATA:  Intermittent left leg pain with exertion for 1 month. Tingling in the left leg beginning tonight. EXAM: CT HEAD WITHOUT CONTRAST TECHNIQUE: Contiguous axial images were obtained from the base of the skull through the vertex without intravenous contrast. COMPARISON:  Facial CT 08/20/2017 FINDINGS: Brain: Old lacunar infarct in the right occipital white matter. No evidence of acute infarction, hemorrhage, hydrocephalus, extra-axial collection or mass lesion/mass effect. Vascular: No hyperdense vessel or unexpected calcification. Skull: Normal. Negative for fracture or focal lesion. Sinuses/Orbits: No acute finding. Other: None. IMPRESSION: No acute intracranial abnormalities. Electronically Signed   By: Burman Nieves M.D.   On: 09/13/2017 00:16   Mr Brain Wo  Contrast  Result Date: 09/13/2017 CLINICAL DATA:  52 y/o M; pain in left leg with walking. Tingling in left arm beginning 1 hour ago. EXAM: MRI HEAD WITHOUT CONTRAST TECHNIQUE: Multiplanar, multiecho pulse sequences of the brain and surrounding structures were obtained without intravenous contrast. COMPARISON:  09/13/2017 CT head. FINDINGS: Brain: No acute infarction, hemorrhage, hydrocephalus, extra-axial collection or mass lesion. 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 Mild diffuse brain parenchymal volume loss. Minimal chronic microvascular ischemic changes of white matter. Vascular: Normal flow voids. Skull and upper cervical spine: Normal marrow signal. Sinuses/Orbits: Negative. Other: None. IMPRESSION: 1. No acute intracranial abnormality identified. 2. Minimal chronic microvascular ischemic changes and mild parenchymal volume loss  of the brain. 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 Electronically Signed   By: Mitzi Hansen M.D.   On: 09/13/2017 01:30    Procedures Procedures (including critical care time)  Medications Ordered in ED Medications  ketorolac (TORADOL) 30 MG/ML injection 30 mg (30 mg Intravenous Given 09/13/17 0025)     Case d/w Dr. Marshia Ly, if no acute finding on MRI discharge.    Final Clinical Impressions(s) / ED Diagnoses   Final diagnoses:  Paresthesias  Pain of left lower extremity   Will set up for outpatient doppler of the LLE.  Follow up with PMD regarding LUE paresthesias.    Return for pain, numbness, changes in vision or speech, fevers >100.4 unrelieved by medication, shortness of breath, intractable vomiting, or diarrhea, abdominal pain, Inability to tolerate liquids or food, cough, altered mental status or any concerns. No signs of systemic illness or infection. The patient is nontoxic-appearing on exam and vital signs are within normal limits. Will refer to urology for microscopy hematuria  as patient is asymptomatic.  I have reviewed the triage vital signs and the nursing notes. Pertinent labs &imaging results that were available during my care of the patient were reviewed by me and considered in my medical decision making (see chart for details).  After history, exam, and medical workup I feel the patient has been appropriately medically screened and is safe for discharge home. Pertinent diagnoses were discussed with the patient. Patient was given return precautions. ED Discharge Orders        Ordered    LE VENOUS     09/13/17 0413    diclofenac sodium (VOLTAREN) 1 % GEL  4 times daily     09/13/17 0413       Hoda Hon, MD 09/13/17 Feliz Beam, Ilyana Manuele, MD 09/15/17 2319

## 2017-09-20 ENCOUNTER — Ambulatory Visit (HOSPITAL_COMMUNITY): Admission: RE | Admit: 2017-09-20 | Payer: Self-pay | Source: Ambulatory Visit

## 2017-09-20 ENCOUNTER — Other Ambulatory Visit: Payer: Self-pay

## 2017-09-20 ENCOUNTER — Emergency Department (HOSPITAL_COMMUNITY): Payer: Self-pay

## 2017-09-20 ENCOUNTER — Emergency Department (HOSPITAL_COMMUNITY)
Admission: EM | Admit: 2017-09-20 | Discharge: 2017-09-20 | Disposition: A | Discharge status: Home / Self Care | Payer: Self-pay | Attending: Emergency Medicine | Admitting: Emergency Medicine

## 2017-09-20 ENCOUNTER — Encounter (HOSPITAL_COMMUNITY): Payer: Self-pay | Admitting: Emergency Medicine

## 2017-09-20 DIAGNOSIS — Z951 Presence of aortocoronary bypass graft: Secondary | ICD-10-CM | POA: Insufficient documentation

## 2017-09-20 DIAGNOSIS — R112 Nausea with vomiting, unspecified: Secondary | ICD-10-CM | POA: Insufficient documentation

## 2017-09-20 DIAGNOSIS — F17210 Nicotine dependence, cigarettes, uncomplicated: Secondary | ICD-10-CM | POA: Insufficient documentation

## 2017-09-20 DIAGNOSIS — R079 Chest pain, unspecified: Secondary | ICD-10-CM | POA: Insufficient documentation

## 2017-09-20 DIAGNOSIS — Z955 Presence of coronary angioplasty implant and graft: Secondary | ICD-10-CM | POA: Insufficient documentation

## 2017-09-20 DIAGNOSIS — R1030 Lower abdominal pain, unspecified: Secondary | ICD-10-CM | POA: Insufficient documentation

## 2017-09-20 DIAGNOSIS — Z79899 Other long term (current) drug therapy: Secondary | ICD-10-CM | POA: Insufficient documentation

## 2017-09-20 DIAGNOSIS — I2510 Atherosclerotic heart disease of native coronary artery without angina pectoris: Secondary | ICD-10-CM | POA: Insufficient documentation

## 2017-09-20 DIAGNOSIS — I252 Old myocardial infarction: Secondary | ICD-10-CM | POA: Insufficient documentation

## 2017-09-20 DIAGNOSIS — R0602 Shortness of breath: Secondary | ICD-10-CM | POA: Insufficient documentation

## 2017-09-20 DIAGNOSIS — M5030 Other cervical disc degeneration, unspecified cervical region: Secondary | ICD-10-CM | POA: Insufficient documentation

## 2017-09-20 LAB — CBC WITH DIFFERENTIAL/PLATELET
BASOS ABS: 0.1 10*3/uL (ref 0.0–0.1)
Basophils Relative: 1 %
EOS PCT: 3 %
Eosinophils Absolute: 0.3 10*3/uL (ref 0.0–0.7)
HCT: 48.7 % (ref 39.0–52.0)
Hemoglobin: 17.3 g/dL — ABNORMAL HIGH (ref 13.0–17.0)
LYMPHS ABS: 2.5 10*3/uL (ref 0.7–4.0)
LYMPHS PCT: 27 %
MCH: 33.3 pg (ref 26.0–34.0)
MCHC: 35.5 g/dL (ref 30.0–36.0)
MCV: 93.7 fL (ref 78.0–100.0)
MONO ABS: 0.9 10*3/uL (ref 0.1–1.0)
MONOS PCT: 9 %
NEUTROS ABS: 5.5 10*3/uL (ref 1.7–7.7)
Neutrophils Relative %: 60 %
Platelets: 240 10*3/uL (ref 150–400)
RBC: 5.2 MIL/uL (ref 4.22–5.81)
RDW: 13.7 % (ref 11.5–15.5)
WBC: 9.2 10*3/uL (ref 4.0–10.5)

## 2017-09-20 LAB — I-STAT TROPONIN, ED
Troponin i, poc: 0 ng/mL (ref 0.00–0.08)
Troponin i, poc: 0.01 ng/mL (ref 0.00–0.08)

## 2017-09-20 LAB — BASIC METABOLIC PANEL
Anion gap: 8 (ref 5–15)
BUN: 6 mg/dL (ref 6–20)
CALCIUM: 8.5 mg/dL — AB (ref 8.9–10.3)
CHLORIDE: 104 mmol/L (ref 98–111)
CO2: 23 mmol/L (ref 22–32)
CREATININE: 0.99 mg/dL (ref 0.61–1.24)
GFR calc Af Amer: >60 mL/min (ref >60)
GFR calc non Af Amer: >60 mL/min (ref >60)
GLUCOSE: 101 mg/dL — AB (ref 70–99)
Potassium: 3.4 mmol/L — ABNORMAL LOW (ref 3.5–5.1)
Sodium: 135 mmol/L (ref 135–145)

## 2017-09-20 LAB — D-DIMER, QUANTITATIVE: D-Dimer, Quant: <0.27 ug/mL-FEU (ref 0.00–0.50)

## 2017-09-20 MED ORDER — GADOBENATE DIMEGLUMINE 529 MG/ML IV SOLN
15.0000 mL | Freq: Once | INTRAVENOUS | Status: AC | PRN
  Administered 2017-09-20: 15 mL via INTRAVENOUS

## 2017-09-20 MED ORDER — ETODOLAC 300 MG PO CAPS
300.0000 mg | ORAL_CAPSULE | Freq: Three times a day (TID) | ORAL | 0 refills | Status: DC
Start: 2017-09-20 — End: 2017-11-07

## 2017-09-20 MED ORDER — CYCLOBENZAPRINE HCL 10 MG PO TABS: 10.0000 mg | ORAL_TABLET | Freq: Two times a day (BID) | ORAL | 0 refills | Status: DC | PRN

## 2017-09-20 MED ORDER — FENTANYL CITRATE (PF) 100 MCG/2ML IJ SOLN
100.0000 ug | Freq: Once | INTRAMUSCULAR | Status: AC
Start: 2017-09-20 — End: 2017-09-20
  Administered 2017-09-20: 100 ug via INTRAVENOUS
  Filled 2017-09-20: qty 2

## 2017-09-20 NOTE — ED Provider Notes (Signed)
[facility] Provider Note: [name], MD, FACEP  CSN: [id number] MRN: [id number] ARRIVAL: 09/20/17 at 0203 ROOM: WA15/WA15   CHIEF COMPLAINT  Left-Sided Pain   HISTORY OF PRESENT ILLNESS  09/20/17 5:23 AM Douglas [name] is a 52 y.o. male who complains of left sided numbness for the past 4 months.  The numbness is not complete and sensitivity.  He is now having a 1 day history of pain in his left arm and left leg.  He rates the pain as a 9 out of 10.  The pain in his leg gets worse with ambulation, typically after about 25 minutes.  He also complains of chest pain that began about 15 minutes after arrival here.  The pain is in his left upper chest, is sharp and is pleuritic in nature.  He rates it as a 9 out of 10.  He also states he is short of breath with this which may be due to reticence to take a deep breath due to pain.  He also complains of daily morning nausea and vomiting.  He usually vomits up clear liquid or mucus.  This morning there was some blood in it.  He has a history of pancreatitis but no pancreatic pain at this time.  He does have some lower abdominal pain with movement or palpation.    Past Medical History:  Diagnosis Date  . Anginal pain (HCC)   . Coronary artery disease   . GERD (gastroesophageal reflux disease)   . History of acute pancreatitis 04/06/2015  . NSTEMI (non-ST elevated myocardial infarction) (HCC) 01/2014  . Pancreatitis   . PTSD (post-traumatic stress disorder)   . Tobacco abuse     Past Surgical History:  Procedure Laterality Date  . CARDIAC CATHETERIZATION N/A 03/14/2016   Procedure: Left Heart Cath and Coronary Angiography;  Surgeon: Kathleene Hazel, MD;  Location: [facility] INVASIVE CV LAB;  Service: Cardiovascular;  Laterality: N/A;  . CORONARY ANGIOPLASTY WITH STENT PLACEMENT  01/18/2014   "1"  . CORONARY ARTERY BYPASS GRAFT N/A 03/19/2016   Procedure: CORONARY ARTERY BYPASS GRAFTING (CABG), ON PUMP, TIMES FIVE, USING LEFT  INTERNAL MAMMARY ARTERY AND RIGHT GREATER SAPHENOUS VEIN HARVESTED ENDOSCOPICALLY;  Surgeon: Delight Ovens, MD;  Location: [facility] OR;  Service: Open Heart Surgery;  Laterality: N/A;  LIMA-LAD SVG-OM SVG-DIAG SEQ SVG-PD-PL  . CORONARY BALLOON ANGIOPLASTY N/A 04/11/2017   Procedure: CORONARY BALLOON ANGIOPLASTY;  Surgeon: Yvonne Kendall, MD;  Location: [facility] INVASIVE CV LAB;  Service: Cardiovascular;  Laterality: N/A;  Distal RCA  . INTRAVASCULAR PRESSURE WIRE/FFR STUDY N/A 04/11/2017   Procedure: INTRAVASCULAR PRESSURE WIRE/FFR STUDY;  Surgeon: Yvonne Kendall, MD;  Location: [facility] INVASIVE CV LAB;  Service: Cardiovascular;  Laterality: N/A;  . LEFT HEART CATH AND CORS/GRAFTS ANGIOGRAPHY N/A 04/11/2017   Procedure: LEFT HEART CATH AND CORS/GRAFTS ANGIOGRAPHY;  Surgeon: Yvonne Kendall, MD;  Location: [facility] INVASIVE CV LAB;  Service: Cardiovascular;  Laterality: N/A;  . LEFT HEART CATHETERIZATION WITH CORONARY ANGIOGRAM N/A 01/18/2014   Procedure: LEFT HEART CATHETERIZATION WITH CORONARY ANGIOGRAM;  Surgeon: Runell Gess, MD;  Location: [facility] CATH LAB;  Service: Cardiovascular;  Laterality: N/A;  . TEE WITHOUT CARDIOVERSION N/A 03/19/2016   Procedure: TRANSESOPHAGEAL ECHOCARDIOGRAM (TEE);  Surgeon: Delight Ovens, MD;  Location: [facility] OR;  Service: Open Heart Surgery;  Laterality: N/A;    Family History  Problem Relation Age of Onset  . Cancer Mother        Lung cancer  . Coronary artery disease Mother   .  Heart failure Maternal Grandmother     Social History   Tobacco Use  . Smoking status: Current Every Day Smoker    Packs/day: 1.00    Years: 31.00    Pack years: 31.00    Types: Cigarettes  . Smokeless tobacco: Never Used  Substance Use Topics  . Alcohol use: Yes    Alcohol/week: 0.6 oz    Types: 1 Cans of beer per week    Comment: occ  . Drug use: No    Types: Marijuana    Comment: denies 06/17/2015    Prior to Admission medications   Medication Sig Start Date End Date Taking?  Authorizing Provider  amLODipine (NORVASC) 10 MG tablet Take 1 tablet (10 mg total) by mouth daily. 08/22/17  Yes Berton Mount I, MD  metoprolol tartrate (LOPRESSOR) 25 MG tablet Take 1 tablet (25 mg total) by mouth 2 (two) times daily. 08/21/17  Yes Berton Mount I, MD  amoxicillin-clavulanate (AUGMENTIN) 875-125 MG tablet Take 1 tablet by mouth every 12 (twelve) hours. Patient not taking: Reported on 09/20/2017 08/21/17   Berton Mount I, MD  aspirin 81 MG chewable tablet Chew 1 tablet (81 mg total) by mouth daily. Patient not taking: Reported on 09/20/2017 04/13/17   Lonia Blood, MD  clopidogrel (PLAVIX) 75 MG tablet Take 1 tablet (75 mg total) by mouth daily with breakfast. Patient not taking: Reported on 08/18/2017 04/13/17   Lonia Blood, MD  cyclobenzaprine (FLEXERIL) 10 MG tablet Take 1 tablet (10 mg total) by mouth 2 (two) times daily as needed for muscle spasms. 09/20/17   Linwood Dibbles, MD  diclofenac sodium (VOLTAREN) 1 % GEL Apply 4 g topically 4 (four) times daily. Patient not taking: Reported on 09/20/2017 09/13/17   Palumbo, April, MD  etodolac (LODINE) 300 MG capsule Take 1 capsule (300 mg total) by mouth every 8 (eight) hours. 09/20/17   Linwood Dibbles, MD  nitroGLYCERIN (NITROSTAT) 0.4 MG SL tablet Place 1 tablet (0.4 mg total) under the tongue every 5 (five) minutes as needed for chest pain. Patient not taking: Reported on 08/18/2017 04/12/17   Lonia Blood, MD  rosuvastatin (CRESTOR) 10 MG tablet Take 1 tablet (10 mg total) by mouth daily at 6 PM. Patient not taking: Reported on 08/18/2017 04/12/17   Lonia Blood, MD    Allergies Ibuprofen and Shellfish allergy   REVIEW OF SYSTEMS  Negative except as noted here or in the History of Present Illness.   PHYSICAL EXAMINATION  Initial Vital Signs Blood pressure (!) 131/98, pulse (!) 112, temperature 98.4 F (36.9 C), temperature source Oral, resp. rate 20, SpO2 96 %.  Examination General: Well-developed,  well-nourished male in no acute distress; appearance consistent with age of record HENT: normocephalic; atraumatic Eyes: pupils equal, round and reactive to light; extraocular muscles intact Neck: supple Heart: regular rate and rhythm; no murmur Lungs: clear to auscultation bilaterally Chest: Left upper chest wall tenderness Abdomen: soft; nondistended; lower abdominal tenderness; no masses or hepatosplenomegaly; bowel sounds present Extremities: No deformity; full range of motion; pulses normal Neurologic: Awake, alert and oriented; motor function intact in all extremities and symmetric; subjectively decreased sensation in the left; no facial droop Skin: Warm and dry Psychiatric: Normal mood and affect   RESULTS  Summary of this visit's results, reviewed by myself:   EKG Interpretation  Date/Time:  Friday September 20 2017 02:13:50 EDT Ventricular Rate:  116 PR Interval:    QRS Duration: 79 QT Interval:  345 QTC Calculation: 480  R Axis:   80 Text Interpretation:  Sinus tachycardia Probable left atrial enlargement Nonspecific T abnrm, anterolateral leads Borderline prolonged QT interval Rate is faster Confirmed by Javon Snee ([id number]) on 09/20/2017 2:20:31 AM       EKG Interpretation  Date/Time:  Friday September 20 2017 04:07:42 EDT Ventricular Rate:  93 PR Interval:    QRS Duration: 80 QT Interval:  385 QTC Calculation: 479 R Axis:   72 Text Interpretation:  Sinus rhythm Probable left atrial enlargement Nonspecific T abnrm, anterolateral leads Borderline prolonged QT interval No significant change was found Confirmed by Paula Libra ([id number]) on 09/20/2017 5:23:52 AM       Laboratory Studies: Results for orders placed or performed during the hospital encounter of 09/20/17 (from the past 24 hour(s))  Basic metabolic panel     Status: Abnormal   Collection Time: 09/20/17  3:52 AM  Result Value Ref Range   Sodium 135 135 - 145 mmol/L   Potassium 3.4 (L) 3.5 - 5.1 mmol/L   Chloride  104 98 - 111 mmol/L   CO2 23 22 - 32 mmol/L   Glucose, Bld 101 (H) 70 - 99 mg/dL   BUN 6 6 - 20 mg/dL   Creatinine, Ser 9.14 0.61 - 1.24 mg/dL   Calcium 8.5 (L) 8.9 - 10.3 mg/dL   GFR calc non Af Amer >60 >60 mL/min   GFR calc Af Amer >60 >60 mL/min   Anion gap 8 5 - 15  CBC with Differential/Platelet     Status: Abnormal   Collection Time: 09/20/17  3:52 AM  Result Value Ref Range   WBC 9.2 4.0 - 10.5 K/uL   RBC 5.20 4.22 - 5.81 MIL/uL   Hemoglobin 17.3 (H) 13.0 - 17.0 g/dL   HCT 78.2 95.6 - 21.3 %   MCV 93.7 78.0 - 100.0 fL   MCH 33.3 26.0 - 34.0 pg   MCHC 35.5 30.0 - 36.0 g/dL   RDW 08.6 57.8 - 46.9 %   Platelets 240 150 - 400 K/uL   Neutrophils Relative % 60 %   Neutro Abs 5.5 1.7 - 7.7 K/uL   Lymphocytes Relative 27 %   Lymphs Abs 2.5 0.7 - 4.0 K/uL   Monocytes Relative 9 %   Monocytes Absolute 0.9 0.1 - 1.0 K/uL   Eosinophils Relative 3 %   Eosinophils Absolute 0.3 0.0 - 0.7 K/uL   Basophils Relative 1 %   Basophils Absolute 0.1 0.0 - 0.1 K/uL  D-dimer, quantitative (not at [facility])     Status: None   Collection Time: 09/20/17  3:52 AM  Result Value Ref Range   D-Dimer, Quant <0.27 0.00 - 0.50 ug/mL-FEU  I-stat troponin, ED     Status: None   Collection Time: 09/20/17  4:01 AM  Result Value Ref Range   Troponin i, poc 0.00 0.00 - 0.08 ng/mL   Comment 3          I-stat troponin, ED     Status: None   Collection Time: 09/20/17  7:24 AM  Result Value Ref Range   Troponin i, poc 0.01 0.00 - 0.08 ng/mL   Comment 3           Imaging Studies: Dg Chest 2 View  Result Date: 09/20/2017 CLINICAL DATA:  52 year old male with chest pain. EXAM: CHEST - 2 VIEW COMPARISON:  Chest radiograph dated 09/12/2017 and 08/18/2017 and chest CT dated 03/14/2016 FINDINGS: The lungs are clear. A 5 mm right upper lobe calcified granuloma  as seen on the prior CT. A 5 mm apparent nodular density over the right lower lung field, likely corresponds to the nipple shadow. There is no pleural  effusion or pneumothorax. The cardiac silhouette is within normal limits. Median sternotomy wires and CABG clips noted. No acute osseous pathology. IMPRESSION: No active cardiopulmonary disease. Electronically Signed   By: Elgie CollardArash  Radparvar M.D.   On: 09/20/2017 03:43   Mr Cervical Spine W Or Wo Contrast  Result Date: 09/20/2017 CLINICAL DATA:  52 year old male with left arm and leg pain and tingling, paresthesia. EXAM: MRI CERVICAL SPINE WITHOUT AND WITH CONTRAST TECHNIQUE: Multiplanar and multiecho pulse sequences of the cervical spine, to include the craniocervical junction and cervicothoracic junction, were obtained without and with intravenous contrast. CONTRAST:  15mL MULTIHANCE GADOBENATE DIMEGLUMINE 529 MG/ML IV SOLN COMPARISON:  Brain MRI 09/13/2017. FINDINGS: Alignment: Straightening and mild reversal of cervical lordosis. Mild retrolisthesis of C5 on C6. Vertebrae: No marrow edema or evidence of acute osseous abnormality. Visualized bone marrow signal is within normal limits. Cord: No cervical spinal cord signal abnormality despite lower cervical stenosis with possible cord mass effect, see below. No abnormal intradural enhancement. No dural thickening. Posterior Fossa, vertebral arteries, paraspinal tissues: Cervicomedullary junction is within normal limits. Stable visible posterior fossa. Preserved major vascular flow voids in the neck. Negative neck soft tissues.  Negative visible lung apices. Disc levels: C2-C3:  Negative. C3-C4: Mild foraminal endplate spurring greater on the right. Minimal to mild facet hypertrophy. Borderline to mild right C4 foraminal stenosis. C4-C5: Mild disc space loss. Small central disc protrusion superimposed on broad-based posterior disc bulging (series 5, image 14). Mild foraminal disc bulging and endplate spurring. Spinal stenosis with mild if any spinal cord mass effect. Mild to moderate bilateral C5 foraminal stenosis. C5-C6: Disc space loss with circumferential disc  bulge and endplate spurring. Broad-based posterior and biforaminal involvement (series 5, image 18). Mild ligament flavum hypertrophy. Spinal stenosis with mild spinal cord mass effect. Severe bilateral C6 foraminal stenosis. C6-C7: Mild disc bulge and endplate spurring. Mild to moderate facet hypertrophy. Mild bilateral C7 foraminal stenosis. C7-T1: Moderate to severe facet hypertrophy greater on the left where degenerative facet joint fluid and a small posteriorly situated synovial cysts are noted (series 3, image 11, should not cause neural compromise). No spinal stenosis. There is mild to moderate left C8 foraminal stenosis. No upper thoracic spinal or foraminal stenosis. IMPRESSION: 1. Degenerative cervical spinal stenosis with up to mild spinal cord mass effect at C4-C5 and C5-C6 primarily due to disc disease. No spinal cord signal abnormality. Associated up to moderate C5 and severe C6 bilateral neural foraminal stenosis. 2. Advanced facet degeneration at C7-T1 greater on the left. Up to moderate associated left C8 neural foraminal stenosis. Electronically Signed   By: Odessa FlemingH  Hall M.D.   On: 09/20/2017 12:25    ED COURSE and MDM  Nursing notes and initial vitals signs, including pulse oximetry, reviewed.  Vitals:   09/20/17 0728 09/20/17 0800 09/20/17 0900 09/20/17 1000  BP: 121/79 (!) 133/91 (!) 134/97 94/63  Pulse: 86 79 76 78  Resp: 13 15 (!) 9 17  Temp:      TempSrc:      SpO2: 99% 97% 97% 97%   6:29 AM Pain improved after IV fentanyl.  D-dimer is normal.  His pain is more consistent with chest wall pain than cardiac pain.  He is left-sided pain and numbness are concerning for cervical myelopathy and an MRI is pending.  A repeat troponin  is also pending.  7:14 AM And by pending.  Dr. Lynelle Doctor will follow up on results and make disposition.  PROCEDURES    ED DIAGNOSES     ICD-10-CM   1. 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

## 2017-09-20 NOTE — ED Triage Notes (Signed)
Pt from home with c/o left arm and leg pain with tingling. Pt has been evaluated for this. Pt states tonight his chest started hurting. Pt has hx of CABG 2 years ago. Pt states he experienced emesis every morning and when it happened this morning, he threw up blood.

## 2017-09-20 NOTE — Discharge Instructions (Addendum)
Follow-up with a primary care doctor regarding your neck pain, and your cardiologist, take medications as needed; consider seeing an neurosurgeon

## 2017-09-20 NOTE — ED Notes (Signed)
Pt reports increased chest pain VSS repeat 12 lead EKG done was unchanged from previous,  given to EDP.  Mr Lisa RocaGillett remains stable

## 2017-09-20 NOTE — ED Notes (Signed)
Lab called to run blue top for D-Dimer

## 2017-09-20 NOTE — ED Provider Notes (Signed)
Pt was seen by Dr Read DriversMolpus.  Please see his notes.  MRI shows DDD.  Delta trop negative.  Will dc home with outpatient follow up.  Extremity sx may be related to his DDD.  No signs of acute cardiac ischemia   Linwood DibblesKnapp, Vasily Fedewa, MD 09/20/17 1323

## 2017-09-27 NOTE — Progress Notes (Signed)
Patient seen in the emergency department 09/12/2017, order received for lower extremity venous duplex with appointment made for 09/13/2017 at 8:00am to have test completed. Patient did not show to this appointment, was rescheduled for 09/20/17, and did not show to that appointment either. Patient arrived to our department today expecting to receive his lower extremity venous duplex, however there is no longer an active order for this exam. It was explained to the patient that he had missed previous appointments, and that there is no active order that enables us to perform the test. The patient was advised to follow up with primary care physician or urgent care if his symptoms persist.  09/27/2017 2:16 PM Gertie FeyMichelle Juana Montini, BS, RVT, RDCS, RDMS

## 2017-11-04 ENCOUNTER — Emergency Department (HOSPITAL_COMMUNITY): Payer: Self-pay

## 2017-11-04 ENCOUNTER — Inpatient Hospital Stay (HOSPITAL_COMMUNITY)
Admission: EM | Admit: 2017-11-04 | Discharge: 2017-11-07 | DRG: 247 | Disposition: A | Discharge status: Home / Self Care | Payer: Self-pay | Attending: Internal Medicine | Admitting: Internal Medicine

## 2017-11-04 ENCOUNTER — Encounter (HOSPITAL_COMMUNITY): Payer: Self-pay

## 2017-11-04 ENCOUNTER — Other Ambulatory Visit: Payer: Self-pay

## 2017-11-04 DIAGNOSIS — I10 Essential (primary) hypertension: Secondary | ICD-10-CM

## 2017-11-04 DIAGNOSIS — I25118 Atherosclerotic heart disease of native coronary artery with other forms of angina pectoris: Secondary | ICD-10-CM

## 2017-11-04 DIAGNOSIS — Z9119 Patient's noncompliance with other medical treatment and regimen: Secondary | ICD-10-CM

## 2017-11-04 DIAGNOSIS — I25110 Atherosclerotic heart disease of native coronary artery with unstable angina pectoris: Secondary | ICD-10-CM | POA: Diagnosis present

## 2017-11-04 DIAGNOSIS — K219 Gastro-esophageal reflux disease without esophagitis: Secondary | ICD-10-CM | POA: Diagnosis present

## 2017-11-04 DIAGNOSIS — I708 Atherosclerosis of other arteries: Secondary | ICD-10-CM | POA: Diagnosis present

## 2017-11-04 DIAGNOSIS — Y831 Surgical operation with implant of artificial internal device as the cause of abnormal reaction of the patient, or of later complication, without mention of misadventure at the time of the procedure: Secondary | ICD-10-CM | POA: Diagnosis present

## 2017-11-04 DIAGNOSIS — Z8249 Family history of ischemic heart disease and other diseases of the circulatory system: Secondary | ICD-10-CM

## 2017-11-04 DIAGNOSIS — Q453 Other congenital malformations of pancreas and pancreatic duct: Secondary | ICD-10-CM

## 2017-11-04 DIAGNOSIS — I119 Hypertensive heart disease without heart failure: Principal | ICD-10-CM | POA: Diagnosis present

## 2017-11-04 DIAGNOSIS — T82855A Stenosis of coronary artery stent, initial encounter: Secondary | ICD-10-CM | POA: Diagnosis present

## 2017-11-04 DIAGNOSIS — Z886 Allergy status to analgesic agent status: Secondary | ICD-10-CM

## 2017-11-04 DIAGNOSIS — I739 Peripheral vascular disease, unspecified: Secondary | ICD-10-CM

## 2017-11-04 DIAGNOSIS — F17210 Nicotine dependence, cigarettes, uncomplicated: Secondary | ICD-10-CM | POA: Diagnosis present

## 2017-11-04 DIAGNOSIS — I200 Unstable angina: Secondary | ICD-10-CM

## 2017-11-04 DIAGNOSIS — Z951 Presence of aortocoronary bypass graft: Secondary | ICD-10-CM

## 2017-11-04 DIAGNOSIS — R079 Chest pain, unspecified: Secondary | ICD-10-CM | POA: Diagnosis present

## 2017-11-04 DIAGNOSIS — F4310 Post-traumatic stress disorder, unspecified: Secondary | ICD-10-CM | POA: Diagnosis present

## 2017-11-04 DIAGNOSIS — K861 Other chronic pancreatitis: Secondary | ICD-10-CM | POA: Diagnosis present

## 2017-11-04 DIAGNOSIS — Z9114 Patient's other noncompliance with medication regimen: Secondary | ICD-10-CM

## 2017-11-04 DIAGNOSIS — I2510 Atherosclerotic heart disease of native coronary artery without angina pectoris: Secondary | ICD-10-CM | POA: Diagnosis present

## 2017-11-04 DIAGNOSIS — Z91013 Allergy to seafood: Secondary | ICD-10-CM

## 2017-11-04 DIAGNOSIS — I252 Old myocardial infarction: Secondary | ICD-10-CM

## 2017-11-04 DIAGNOSIS — Z955 Presence of coronary angioplasty implant and graft: Secondary | ICD-10-CM

## 2017-11-04 DIAGNOSIS — R112 Nausea with vomiting, unspecified: Secondary | ICD-10-CM

## 2017-11-04 DIAGNOSIS — Z7982 Long term (current) use of aspirin: Secondary | ICD-10-CM

## 2017-11-04 DIAGNOSIS — Z801 Family history of malignant neoplasm of trachea, bronchus and lung: Secondary | ICD-10-CM

## 2017-11-04 DIAGNOSIS — E785 Hyperlipidemia, unspecified: Secondary | ICD-10-CM | POA: Diagnosis present

## 2017-11-04 LAB — COMPREHENSIVE METABOLIC PANEL
ALK PHOS: 98 U/L (ref 38–126)
ALT: 21 U/L (ref 0–44)
ANION GAP: 9 (ref 5–15)
AST: 28 U/L (ref 15–41)
Albumin: 3.7 g/dL (ref 3.5–5.0)
BUN: 8 mg/dL (ref 6–20)
CALCIUM: 9.2 mg/dL (ref 8.9–10.3)
CO2: 24 mmol/L (ref 22–32)
Chloride: 105 mmol/L (ref 98–111)
Creatinine, Ser: 1.05 mg/dL (ref 0.61–1.24)
GFR calc non Af Amer: >60 mL/min (ref >60)
Glucose, Bld: 113 mg/dL — ABNORMAL HIGH (ref 70–99)
Potassium: 4.8 mmol/L (ref 3.5–5.1)
SODIUM: 138 mmol/L (ref 135–145)
Total Bilirubin: 0.7 mg/dL (ref 0.3–1.2)
Total Protein: 6.8 g/dL (ref 6.5–8.1)

## 2017-11-04 LAB — CBC
HCT: 52.4 % — ABNORMAL HIGH (ref 39.0–52.0)
Hemoglobin: 18.3 g/dL — ABNORMAL HIGH (ref 13.0–17.0)
MCH: 34.6 pg — ABNORMAL HIGH (ref 26.0–34.0)
MCHC: 34.9 g/dL (ref 30.0–36.0)
MCV: 99.1 fL (ref 78.0–100.0)
PLATELETS: 256 10*3/uL (ref 150–400)
RBC: 5.29 MIL/uL (ref 4.22–5.81)
RDW: 13.7 % (ref 11.5–15.5)
WBC: 9 10*3/uL (ref 4.0–10.5)

## 2017-11-04 LAB — TROPONIN I: Troponin I: <0.03 ng/mL (ref <0.03)

## 2017-11-04 LAB — I-STAT TROPONIN, ED: TROPONIN I, POC: 0.02 ng/mL (ref 0.00–0.08)

## 2017-11-04 MED ORDER — FAMOTIDINE 20 MG PO TABS
20.0000 mg | ORAL_TABLET | Freq: Two times a day (BID) | ORAL | Status: DC
  Administered 2017-11-04 – 2017-11-07 (×7): 20 mg via ORAL
  Filled 2017-11-04 (×7): qty 1

## 2017-11-04 MED ORDER — METOPROLOL TARTRATE 25 MG PO TABS
25.0000 mg | ORAL_TABLET | Freq: Two times a day (BID) | ORAL | Status: DC
  Administered 2017-11-04 – 2017-11-07 (×6): 25 mg via ORAL
  Filled 2017-11-04 (×6): qty 1

## 2017-11-04 MED ORDER — ACETAMINOPHEN 325 MG PO TABS
650.0000 mg | ORAL_TABLET | ORAL | Status: DC | PRN
  Administered 2017-11-04 – 2017-11-05 (×2): 650 mg via ORAL
  Filled 2017-11-04 (×2): qty 2

## 2017-11-04 MED ORDER — OXYCODONE HCL 5 MG PO TABS
5.0000 mg | ORAL_TABLET | ORAL | Status: AC | PRN
  Administered 2017-11-04 – 2017-11-05 (×3): 10 mg via ORAL
  Filled 2017-11-04 (×3): qty 2

## 2017-11-04 MED ORDER — ASPIRIN 325 MG PO TABS: 325.0000 mg | ORAL_TABLET | Freq: Every day | ORAL | Status: DC

## 2017-11-04 MED ORDER — ASPIRIN 81 MG PO CHEW
324.0000 mg | CHEWABLE_TABLET | Freq: Once | ORAL | Status: AC
  Administered 2017-11-04: 324 mg via ORAL
  Filled 2017-11-04: qty 4

## 2017-11-04 MED ORDER — GI COCKTAIL ~~LOC~~
30.0000 mL | Freq: Three times a day (TID) | ORAL | Status: DC | PRN
  Administered 2017-11-04: 30 mL via ORAL
  Filled 2017-11-04: qty 30

## 2017-11-04 MED ORDER — ENOXAPARIN SODIUM 40 MG/0.4ML ~~LOC~~ SOLN
40.0000 mg | SUBCUTANEOUS | Status: DC
  Administered 2017-11-04 – 2017-11-05 (×2): 40 mg via SUBCUTANEOUS
  Filled 2017-11-04 (×2): qty 0.4

## 2017-11-04 MED ORDER — NITROGLYCERIN 2 % TD OINT
0.5000 [in_us] | TOPICAL_OINTMENT | Freq: Four times a day (QID) | TRANSDERMAL | Status: DC
  Administered 2017-11-04 – 2017-11-06 (×8): 0.5 [in_us] via TOPICAL
  Filled 2017-11-04: qty 30
  Filled 2017-11-04: qty 1

## 2017-11-04 MED ORDER — ONDANSETRON HCL 4 MG/2ML IJ SOLN
4.0000 mg | Freq: Four times a day (QID) | INTRAMUSCULAR | Status: DC | PRN
  Administered 2017-11-04: 4 mg via INTRAVENOUS
  Filled 2017-11-04: qty 2

## 2017-11-04 MED ORDER — NITROGLYCERIN 0.4 MG SL SUBL
0.4000 mg | SUBLINGUAL_TABLET | Freq: Once | SUBLINGUAL | Status: AC
  Administered 2017-11-04 (×2): 0.4 mg via SUBLINGUAL
  Filled 2017-11-04: qty 1

## 2017-11-04 MED ORDER — AMLODIPINE BESYLATE 10 MG PO TABS
10.0000 mg | ORAL_TABLET | Freq: Every day | ORAL | Status: DC
  Administered 2017-11-04 – 2017-11-07 (×4): 10 mg via ORAL
  Filled 2017-11-04: qty 2
  Filled 2017-11-04: qty 1
  Filled 2017-11-04 (×2): qty 2

## 2017-11-04 MED ORDER — NICOTINE 21 MG/24HR TD PT24
21.0000 mg | MEDICATED_PATCH | Freq: Every day | TRANSDERMAL | Status: DC
  Administered 2017-11-04 – 2017-11-06 (×3): 21 mg via TRANSDERMAL
  Filled 2017-11-04 (×3): qty 1

## 2017-11-04 MED ORDER — NITROGLYCERIN 0.4 MG SL SUBL
0.4000 mg | SUBLINGUAL_TABLET | SUBLINGUAL | Status: DC | PRN
  Administered 2017-11-04 (×3): 0.4 mg via SUBLINGUAL
  Filled 2017-11-04: qty 1

## 2017-11-04 NOTE — ED Triage Notes (Signed)
Patient c/o intermittent mid and left chest pain and SOB x 3 days. patient reports that pain radiates down both arms. patient states SOB increases with activity which is unusual for him.

## 2017-11-04 NOTE — ED Notes (Signed)
Pt to be transported to [facility]/ Cardiology.

## 2017-11-04 NOTE — Progress Notes (Signed)
ANTICOAGULATION CONSULT NOTE - Initial Consult  Pharmacy Consult for Lovenox Indication: VTE prophylaxis  Allergies  Allergen Reactions  . Ibuprofen Nausea And Vomiting    UPSETS STOMACH  . Shellfish Allergy Nausea And Vomiting    Patient Measurements: Height: 6' (182.9 cm) Weight: 155 lb (70.3 kg) IBW/kg (Calculated) : 77.6  Vital Signs: Temp: 97.9 F (36.6 C) (08/26 1048) Temp Source: Oral (08/26 1048) BP: 149/90 (08/26 1216) Pulse Rate: 120 (08/26 1048)  Labs: Recent Labs    11/04/17 1132  HGB 18.3*  HCT 52.4*  PLT 256  CREATININE 1.05    Estimated Creatinine Clearance: 83.7 mL/min (by C-G formula based on SCr of 1.05 mg/dL).   Medical History: Past Medical History:  Diagnosis Date  . Anginal pain (HCC)   . Coronary artery disease   . GERD (gastroesophageal reflux disease)   . History of acute pancreatitis 04/06/2015  . NSTEMI (non-ST elevated myocardial infarction) (HCC) 01/2014  . Pancreatitis   . PTSD (post-traumatic stress disorder)   . Tobacco abuse     Medications:  No oral anticoagulants PTA  Assessment: Pt to be admitted with chest pain- troponin negative.  Scr, CBC WNL.  Lean body weight.   Goal of Therapy:  Monitor platelets by anticoagulation protocol: Yes   Plan:  Lovenox 40mg  sq q24h No dose adjustments anticipated- pharmacy to sign off.  Please re-consult if needed.   Elson ClanLilliston, Kasir Hallenbeck Michelle 11/04/2017,1:44 PM

## 2017-11-04 NOTE — ED Notes (Signed)
ED TO INPATIENT HANDOFF REPORT  Name/Age/Gender Douglas [name] 52 y.o. male  Code Status    Code Status Orders  (From admission, onward)         Start     Ordered   11/04/17 1333  Full code  Continuous     11/04/17 1333        Code Status History    Date Active Date Inactive Code Status Order ID Comments User Context   04/10/2017 0511 04/12/2017 1836 Full Code [id number]  Rise Patience, MD ED   10/08/2016 2205 10/09/2016 2004 Full Code [id number]  Rise Patience, MD ED   03/14/2016 0612 03/27/2016 1927 Full Code [id number]  Sueanne Margarita, MD ED   04/16/2015 1047 04/19/2015 1557 Full Code [id number]  Debbe Odea, MD ED   04/06/2015 1157 04/10/2015 1617 Full Code [id number]  Rama, Venetia Maxon, MD ED   03/05/2015 1443 03/10/2015 1735 Full Code [id number]  Velvet Bathe, MD Inpatient   01/18/2014 1714 01/19/2014 1729 Full Code [id number]  Lorretta Harp, MD Inpatient   01/18/2014 0559 01/18/2014 1714 Full Code [id number]  Jacolyn Reedy, MD ED      Home/SNF/Other Home  Chief Complaint chest;arm;leg pain  Level of Care/Admitting Diagnosis ED Disposition    ED Disposition Condition [facility]: [facility] [[id number]]  Level of Care: Telemetry [5]  Admit to tele based on following criteria: Monitor for Ischemic changes  Diagnosis: Chest pain [974163]  Admitting Physician: [place], [place]  Attending Physician: Debbe Odea [[id number]]  PT Class (Do Not Modify): Observation [104]  PT Acc Code (Do Not Modify): Observation [10022]       Medical History Past Medical History:  Diagnosis Date  . Anginal pain ([place])   . Coronary artery disease   . GERD (gastroesophageal reflux disease)   . History of acute pancreatitis 04/06/2015  . NSTEMI (non-ST elevated myocardial infarction) ([place]) 01/2014  . Pancreatitis   . PTSD (post-traumatic stress disorder)   . Tobacco abuse     Allergies Allergies  Allergen Reactions  . Ibuprofen  Nausea And Vomiting    UPSETS STOMACH  . Shellfish Allergy Nausea And Vomiting    IV Location/Drains/Wounds Patient Lines/Drains/Airways Status   Active Line/Drains/Airways    Name:   Placement date:   Placement time:   Site:   Days:   Peripheral IV 11/04/17 Left Antecubital   11/04/17    1134    Antecubital   less than 1          Labs/Imaging Results for orders placed or performed during the hospital encounter of 11/04/17 (from the past 48 hour(s))  CBC     Status: Abnormal   Collection Time: 11/04/17 11:32 AM  Result Value Ref Range   WBC 9.0 4.0 - 10.5 K/uL   RBC 5.29 4.22 - 5.81 MIL/uL   Hemoglobin 18.3 (H) 13.0 - 17.0 g/dL   HCT 52.4 (H) 39.0 - 52.0 %   MCV 99.1 78.0 - 100.0 fL   MCH 34.6 (H) 26.0 - 34.0 pg   MCHC 34.9 30.0 - 36.0 g/dL   RDW 13.7 11.5 - 15.5 %   Platelets 256 150 - 400 K/uL    Comment: Performed at [facility], [place] [address] [place], [place] [address]  Comprehensive metabolic panel     Status: Abnormal   Collection Time: 11/04/17 11:32 AM  Result Value Ref Range   Sodium 138 135 - 145 mmol/L  Potassium 4.8 3.5 - 5.1 mmol/L   Chloride 105 98 - 111 mmol/L   CO2 24 22 - 32 mmol/L   Glucose, Bld 113 (H) 70 - 99 mg/dL   BUN 8 6 - 20 mg/dL   Creatinine, Ser 1.05 0.61 - 1.24 mg/dL   Calcium 9.2 8.9 - 10.3 mg/dL   Total Protein 6.8 6.5 - 8.1 g/dL   Albumin 3.7 3.5 - 5.0 g/dL   AST 28 15 - 41 U/L   ALT 21 0 - 44 U/L   Alkaline Phosphatase 98 38 - 126 U/L   Total Bilirubin 0.7 0.3 - 1.2 mg/dL   GFR calc non Af Amer >60 >60 mL/min   GFR calc Af Amer >60 >60 mL/min    Comment: (NOTE) The eGFR has been calculated using the CKD EPI equation. This calculation has not been validated in all clinical situations. eGFR's persistently <60 mL/min signify possible Chronic Kidney Disease.    Anion gap 9 5 - 15    Comment: Performed at [facility], [place] [address] [place], [place] [address]  I-stat troponin, ED      Status: None   Collection Time: 11/04/17 11:41 AM  Result Value Ref Range   Troponin i, poc 0.02 0.00 - 0.08 ng/mL   Comment 3            Comment: Due to the release kinetics of cTnI, a negative result within the first hours of the onset of symptoms does not rule out myocardial infarction with certainty. If myocardial infarction is still suspected, repeat the test at appropriate intervals.    Dg Chest 2 View  Result Date: 11/04/2017 CLINICAL DATA:  Intermittent mid and LEFT chest pain with shortness of breath for 3 days, pain radiating down both arms, increased shortness of breath with activity, history hypertension, coronary artery disease post NSTEMI and CABG EXAM: CHEST - 2 VIEW COMPARISON:  09/20/2017 FINDINGS: Normal heart size post CABG. Mediastinal contours and pulmonary vascularity normal. Calcified granuloma RIGHT upper lobe. BILATERAL nipple shadows, unchanged. No infiltrate, pleural effusion or pneumothorax. Osseous structures unremarkable. IMPRESSION: No acute abnormalities. Electronically Signed   By: Lavonia Dana M.D.   On: 11/04/2017 11:11    Pending Labs Unresulted Labs (From admission, onward)    Start     Ordered   11/04/17 1419  Troponin I  Now then every 6 hours,   R     11/04/17 1418   11/04/17 1332  HIV antibody (Routine Testing)  Once,   R     11/04/17 1333          Vitals/Pain Today's Vitals   11/04/17 1125 11/04/17 1137 11/04/17 1216 11/04/17 1222  BP: (!) 158/104  (!) 149/90   Pulse:      Resp: (!) 24  14   Temp:      TempSrc:      SpO2:      Weight:      Height:      PainSc:  7   7     Isolation Precautions No active isolations  Medications Medications  amLODipine (NORVASC) tablet 10 mg (has no administration in time range)  aspirin tablet 325 mg (has no administration in time range)  metoprolol tartrate (LOPRESSOR) tablet 25 mg (has no administration in time range)  nicotine (NICODERM CQ - dosed in mg/24 hours) patch 21 mg (has no  administration in time range)  nitroGLYCERIN (NITROGLYN) 2 % ointment 0.5 inch (has no administration in time range)  nitroGLYCERIN (NITROSTAT) SL tablet 0.4 mg (has no administration in time range)  acetaminophen (TYLENOL) tablet 650 mg (has no administration in time range)  ondansetron (ZOFRAN) injection 4 mg (has no administration in time range)  famotidine (PEPCID) tablet 20 mg (has no administration in time range)  enoxaparin (LOVENOX) injection 40 mg (has no administration in time range)  gi cocktail (Maalox,Lidocaine,Donnatal) (has no administration in time range)  nitroGLYCERIN (NITROSTAT) SL tablet 0.4 mg (0.4 mg Sublingual Given 11/04/17 1219)  aspirin chewable tablet 324 mg (324 mg Oral Given 11/04/17 1126)    Mobility walks

## 2017-11-04 NOTE — ED Notes (Signed)
ED TO INPATIENT HANDOFF REPORT  Name/Age/Gender Leanord Asal 52 y.o. male  Code Status    Code Status Orders  (From admission, onward)         Start     Ordered   11/04/17 1333  Full code  Continuous     11/04/17 1333        Code Status History    Date Active Date Inactive Code Status Order ID Comments User Context   04/10/2017 0511 04/12/2017 1836 Full Code [id number]  Rise Patience, MD ED   10/08/2016 2205 10/09/2016 2004 Full Code [id number]  Rise Patience, MD ED   03/14/2016 0612 03/27/2016 1927 Full Code [id number]  Sueanne Margarita, MD ED   04/16/2015 1047 04/19/2015 1557 Full Code [id number]  Debbe Odea, MD ED   04/06/2015 1157 04/10/2015 1617 Full Code [id number]  Rama, Venetia Maxon, MD ED   03/05/2015 1443 03/10/2015 1735 Full Code [id number]  Velvet Bathe, MD Inpatient   01/18/2014 1714 01/19/2014 1729 Full Code [id number]  Lorretta Harp, MD Inpatient   01/18/2014 0559 01/18/2014 1714 Full Code [id number]  Jacolyn Reedy, MD ED      Home/SNF/Other Home  Chief Complaint chest;arm;leg pain  Level of Care/Admitting Diagnosis ED Disposition    ED Disposition Condition [place]: [place] [100100]  Level of Care: Telemetry [5]  Diagnosis: Chest pain [233007]  Admitting Physician: [place], [place]  Attending Physician: Debbe Odea [[id number]]  PT Class (Do Not Modify): Observation [104]  PT Acc Code (Do Not Modify): Observation [10022]       Medical History Past Medical History:  Diagnosis Date  . Anginal pain ([place])   . Coronary artery disease   . GERD (gastroesophageal reflux disease)   . History of acute pancreatitis 04/06/2015  . NSTEMI (non-ST elevated myocardial infarction) ([place]) 01/2014  . Pancreatitis   . PTSD (post-traumatic stress disorder)   . Tobacco abuse     Allergies Allergies  Allergen Reactions  . Ibuprofen Nausea And Vomiting    UPSETS STOMACH  . Shellfish Allergy Nausea And  Vomiting    IV Location/Drains/Wounds Patient Lines/Drains/Airways Status   Active Line/Drains/Airways    Name:   Placement date:   Placement time:   Site:   Days:   Peripheral IV 11/04/17 Left Antecubital   11/04/17    1134    Antecubital   less than 1          Labs/Imaging Results for orders placed or performed during the hospital encounter of 11/04/17 (from the past 48 hour(s))  CBC     Status: Abnormal   Collection Time: 11/04/17 11:32 AM  Result Value Ref Range   WBC 9.0 4.0 - 10.5 K/uL   RBC 5.29 4.22 - 5.81 MIL/uL   Hemoglobin 18.3 (H) 13.0 - 17.0 g/dL   HCT 52.4 (H) 39.0 - 52.0 %   MCV 99.1 78.0 - 100.0 fL   MCH 34.6 (H) 26.0 - 34.0 pg   MCHC 34.9 30.0 - 36.0 g/dL   RDW 13.7 11.5 - 15.5 %   Platelets 256 150 - 400 K/uL    Comment: Performed at [facility], [place] [address], [place], [place] [address]  Comprehensive metabolic panel     Status: Abnormal   Collection Time: 11/04/17 11:32 AM  Result Value Ref Range   Sodium 138 135 - 145 mmol/L   Potassium 4.8 3.5 - 5.1 mmol/L   Chloride 105 98 -  111 mmol/L   CO2 24 22 - 32 mmol/L   Glucose, Bld 113 (H) 70 - 99 mg/dL   BUN 8 6 - 20 mg/dL   Creatinine, Ser 1.05 0.61 - 1.24 mg/dL   Calcium 9.2 8.9 - 10.3 mg/dL   Total Protein 6.8 6.5 - 8.1 g/dL   Albumin 3.7 3.5 - 5.0 g/dL   AST 28 15 - 41 U/L   ALT 21 0 - 44 U/L   Alkaline Phosphatase 98 38 - 126 U/L   Total Bilirubin 0.7 0.3 - 1.2 mg/dL   GFR calc non Af Amer >60 >60 mL/min   GFR calc Af Amer >60 >60 mL/min    Comment: (NOTE) The eGFR has been calculated using the CKD EPI equation. This calculation has not been validated in all clinical situations. eGFR's persistently <60 mL/min signify possible Chronic Kidney Disease.    Anion gap 9 5 - 15    Comment: Performed at [facility], [place] [address] [place], [place] [address]  I-stat troponin, ED     Status: None   Collection Time: 11/04/17 11:41 AM  Result Value Ref Range    Troponin i, poc 0.02 0.00 - 0.08 ng/mL   Comment 3            Comment: Due to the release kinetics of cTnI, a negative result within the first hours of the onset of symptoms does not rule out myocardial infarction with certainty. If myocardial infarction is still suspected, repeat the test at appropriate intervals.    Dg Chest 2 View  Result Date: 11/04/2017 CLINICAL DATA:  Intermittent mid and LEFT chest pain with shortness of breath for 3 days, pain radiating down both arms, increased shortness of breath with activity, history hypertension, coronary artery disease post NSTEMI and CABG EXAM: CHEST - 2 VIEW COMPARISON:  09/20/2017 FINDINGS: Normal heart size post CABG. Mediastinal contours and pulmonary vascularity normal. Calcified granuloma RIGHT upper lobe. BILATERAL nipple shadows, unchanged. No infiltrate, pleural effusion or pneumothorax. Osseous structures unremarkable. IMPRESSION: No acute abnormalities. Electronically Signed   By: Lavonia Dana M.D.   On: 11/04/2017 11:11    Pending Labs Unresulted Labs (From admission, onward)    Start     Ordered   11/04/17 1419  Troponin I  Now then every 6 hours,   R     11/04/17 1418   11/04/17 1332  HIV antibody (Routine Testing)  Once,   R     11/04/17 1333          Vitals/Pain Today's Vitals   11/04/17 1125 11/04/17 1137 11/04/17 1216 11/04/17 1222  BP: (!) 158/104  (!) 149/90   Pulse:      Resp: (!) 24  14   Temp:      TempSrc:      SpO2:      Weight:      Height:      PainSc:  7   7     Isolation Precautions No active isolations  Medications Medications  amLODipine (NORVASC) tablet 10 mg (has no administration in time range)  aspirin tablet 325 mg (has no administration in time range)  metoprolol tartrate (LOPRESSOR) tablet 25 mg (has no administration in time range)  nicotine (NICODERM CQ - dosed in mg/24 hours) patch 21 mg (has no administration in time range)  nitroGLYCERIN (NITROGLYN) 2 % ointment 0.5 inch  (has no administration in time range)  nitroGLYCERIN (NITROSTAT) SL tablet 0.4 mg (has no administration in time  range)  acetaminophen (TYLENOL) tablet 650 mg (has no administration in time range)  ondansetron (ZOFRAN) injection 4 mg (has no administration in time range)  famotidine (PEPCID) tablet 20 mg (has no administration in time range)  enoxaparin (LOVENOX) injection 40 mg (has no administration in time range)  gi cocktail (Maalox,Lidocaine,Donnatal) (has no administration in time range)  nitroGLYCERIN (NITROSTAT) SL tablet 0.4 mg (0.4 mg Sublingual Given 11/04/17 1219)  aspirin chewable tablet 324 mg (324 mg Oral Given 11/04/17 1126)    Mobility walks with person assist

## 2017-11-04 NOTE — H&P (Signed)
History and Physical    Douglas [name]  NWG:[id number]  DOB: Jul 02, 1965  DOA: 11/04/2017 PCP: Patient, No Pcp Per   Patient coming from: home  Chief Complaint: chest pain  HPI: Douglas EndsJonathan [name] is a 52 y.o. male with medical history of CAD/ CABG, PVD, HLD, issues with compliance with medical treatment, Tobacco abuse, Alcohol abuse.  Per cardiac catheterization 04/11/17, he had an "occlusion of all of his saphenous vein grafts with PCI done of the RCA."   He presents with chest pain present in the center of his chest described as sharp and tight. He states it radiated to both his arm and his left leg. He feels it improved with Nitro received in the ED. No other associated symptoms. He currently feels the pain is recurring and is about a 7/10.  He did have vomiting today but prior to the episode of pain. He has been having vomiting every morning for a few days now and then nausea most of the day. Has not eaten much due to the nausea. He is able to drink fluids. He admits to taking frequent BC powders for chronic headaches.   ED Course: given Nitro and Aspirin.   Review of Systems:  headaches Dry cough Easy bruising All other systems reviewed and apart from HPI, are negative.  Past Medical History:  Diagnosis Date  . Anginal pain ([facility])   . Coronary artery disease   . GERD (gastroesophageal reflux disease)   . History of acute pancreatitis 04/06/2015  . NSTEMI (non-ST elevated myocardial infarction) ([facility]) 01/2014  . Pancreatitis   . PTSD (post-traumatic stress disorder)   . Tobacco abuse     Past Surgical History:  Procedure Laterality Date  . CARDIAC CATHETERIZATION N/A 03/14/2016   Procedure: Left Heart Cath and Coronary Angiography;  Surgeon: Kathleene Hazelhristopher D McAlhany, MD;  [facility];  Service: Cardiovascular;  Laterality: N/A;  . CORONARY ANGIOPLASTY WITH STENT PLACEMENT  01/18/2014   "1"  . CORONARY ARTERY BYPASS GRAFT N/A 03/19/2016   Procedure: CORONARY  ARTERY BYPASS GRAFTING (CABG), ON PUMP, TIMES FIVE, USING LEFT INTERNAL MAMMARY ARTERY AND RIGHT GREATER SAPHENOUS VEIN HARVESTED ENDOSCOPICALLY;  Surgeon: Delight OvensEdward B Gerhardt, MD;  [facility];  Service: Open Heart Surgery;  Laterality: N/A;  LIMA-LAD SVG-OM SVG-DIAG SEQ SVG-PD-PL  . CORONARY BALLOON ANGIOPLASTY N/A 04/11/2017   Procedure: CORONARY BALLOON ANGIOPLASTY;  Surgeon: Yvonne KendallEnd, Christopher, MD;  Location: [facility] INVAS[facility] CV LAB;  Service: Cardiovascular;  Laterality: N/A;  Distal RCA  . INTRAVASCULAR PRESSURE WIRE/FFR STUDY N/A 04/11/2017   Procedure: INTRAVASCULAR PRESSURE WIRE/FFR STUDY;  Surgeon: Yvonne KendallEnd, Christopher, MD;  Location: [facility] INVASIVE CV LAB;  Service: Cardiovascular;  Laterality: N/A;  . LEFT HEART CATH AND CORS/GRAFTS ANGIOGRAPHY N/A 04/11/2017   Procedure: LEFT HEART CATH AND CORS/GRAFTS ANGIOGRAPHY;  Surgeon: Yvonne KendallEnd, Christopher, MD;  Location: [facility]VAS[facility] CV LAB;  Service: Cardiovascular;  Laterality: N/A;  . LEFT HEART CATHETERIZATION WITH CORONARY ANGIOGRAM N/A 01/18/2014   Procedure: LEFT HEART CATHETERIZATION WITH CORONARY ANGIOGRAM;  Surgeon: Runell GessJonathan J Berry, MD;  [facility];  Service: Cardiovascular;  Laterality: N/A;  . TEE WITHOUT CARDIOVERSION N/A 03/19/2016   Procedure: TRANSESOPHAGEAL ECHOCARDIOGRAM (TEE);  Surgeon: Delight OvensEdward B Gerhardt, MD;  [facility];  Service: Open Heart Surgery;  Laterality: N/A;    Social History:   Reports that he has quit smoking in January. His smoking use included cigarettes. He has a 31.00 pack-year smoking history. He has never used smokeless tobacco.  He stopped drinking alcohol "several  months ago".   He reports that he has no current drug history   Allergies  Allergen Reactions  . Ibuprofen Nausea And Vomiting    UPSETS STOMACH  . Shellfish Allergy Nausea And Vomiting    Family History  Problem Relation Age of Onset  . Cancer Mother        Lung cancer  . Coronary artery disease Mother   . Heart failure Maternal  Grandmother      Prior to Admission medications   Medication Sig Start Date End Date Taking? Authorizing Provider  amLODipine (NORVASC) 10 MG tablet Take 1 tablet (10 mg total) by mouth daily. 08/22/17  Yes Berton Mount I, MD  aspirin 325 MG tablet Take 325 mg by mouth daily.   Yes [provider]  Aspirin-Salicylamide-Caffeine (BC HEADACHE PO) Take 1 packet by mouth 2 (two) times daily.   Yes [provider]  metoprolol tartrate (LOPRESSOR) 25 MG tablet Take 1 tablet (25 mg total) by mouth 2 (two) times daily. 08/21/17  Yes Berton Mount I, MD  nicotine (NICODERM CQ - DOSED IN MG/24 HOURS) 21 mg/24hr patch Place 21 mg onto the skin daily.   Yes [provider]  nitroGLYCERIN (NITROSTAT) 0.4 MG SL tablet Place 1 tablet (0.4 mg total) under the tongue every 5 (five) minutes as needed for chest pain. 04/12/17  Yes Lonia Blood, MD  amoxicillin-clavulanate (AUGMENTIN) 875-125 MG tablet Take 1 tablet by mouth every 12 (twelve) hours. Patient not taking: Reported on 11/04/2017 08/21/17   Berton Mount I, MD  aspirin 81 MG chewable tablet Chew 1 tablet (81 mg total) by mouth daily. Patient not taking: Reported on 11/04/2017 04/13/17   Lonia Blood, MD  clopidogrel (PLAVIX) 75 MG tablet Take 1 tablet (75 mg total) by mouth daily with breakfast. Patient not taking: Reported on 11/04/2017 04/13/17   Lonia Blood, MD  cyclobenzaprine (FLEXERIL) 10 MG tablet Take 1 tablet (10 mg total) by mouth 2 (two) times daily as needed for muscle spasms. Patient not taking: Reported on 11/04/2017 09/20/17   Linwood Dibbles, MD  diclofenac sodium (VOLTAREN) 1 % GEL Apply 4 g topically 4 (four) times daily. Patient not taking: Reported on 11/04/2017 09/13/17   Palumbo, April, MD  etodolac (LODINE) 300 MG capsule Take 1 capsule (300 mg total) by mouth every 8 (eight) hours. Patient not taking: Reported on 11/04/2017 09/20/17   Linwood Dibbles, MD  rosuvastatin (CRESTOR) 10 MG tablet Take 1  tablet (10 mg total) by mouth daily at 6 PM. Patient not taking: Reported on 11/04/2017 04/12/17   Lonia Blood, MD    Physical Exam: Wt Readings from Last 3 Encounters:  11/04/17 70.3 kg  08/18/17 69.1 kg  04/12/17 70.6 kg   Vitals:   11/04/17 1048 11/04/17 1049 11/04/17 1125 11/04/17 1216  BP: (!) 167/115  (!) 158/104 (!) 149/90  Pulse: (!) 120     Resp: 14  (!) 24 14  Temp: 97.9 F (36.6 C)     TempSrc: Oral     SpO2: 100%     Weight:  70.3 kg    Height:  6' (1.829 m)        Constitutional:  Calm & comfortable Eyes: PERRLA, lids and conjunctivae normal ENT:  Mucous membranes are moist.  Pharynx clear of exudate   Normal dentition.  Neck: Supple, no masses  Respiratory:  Clear to auscultation bilaterally  Normal respiratory effort.  Cardiovascular:  S1 & S2 heard, regular rate and rhythm  No Murmurs Abdomen:  Non distended No tenderness, No masses Bowel sounds normal Extremities:  No clubbing / cyanosis No pedal edema No joint deformity    Skin:  No rashes, lesions or ulcers Neurologic:  AAO x 3 CN 2-12 grossly intact Sensation intact Strength 5/5 in all 4 extremities Psychiatric:  Normal Mood and affect    Labs on Admission: I have personally reviewed following labs and imaging studies  CBC: Recent Labs  Lab 11/04/17 1132  WBC 9.0  HGB 18.3*  HCT 52.4*  MCV 99.1  PLT 256   Basic Metabolic Panel: Recent Labs  Lab 11/04/17 1132  NA 138  K 4.8  CL 105  CO2 24  GLUCOSE 113*  BUN 8  CREATININE 1.05  CALCIUM 9.2   GFR: Estimated Creatinine Clearance: 83.7 mL/min (by C-G formula based on SCr of 1.05 mg/dL). Liver Function Tests: Recent Labs  Lab 11/04/17 1132  AST 28  ALT 21  ALKPHOS 98  BILITOT 0.7  PROT 6.8  ALBUMIN 3.7   No results for input(s): LIPASE, AMYLASE in the last 168 hours. No results for input(s): AMMONIA in the last 168 hours. Coagulation Profile: No results for input(s): INR, PROTIME in the last 168  hours. Cardiac Enzymes: No results for input(s): CKTOTAL, CKMB, CKMBINDEX, TROPONINI in the last 168 hours. BNP (last 3 results) No results for input(s): PROBNP in the last 8760 hours. HbA1C: No results for input(s): HGBA1C in the last 72 hours. CBG: No results for input(s): GLUCAP in the last 168 hours. Lipid Profile: No results for input(s): CHOL, HDL, LDLCALC, TRIG, CHOLHDL, LDLDIRECT in the last 72 hours. Thyroid Function Tests: No results for input(s): TSH, T4TOTAL, FREET4, T3FREE, THYROIDAB in the last 72 hours. Anemia Panel: No results for input(s): VITAMINB12, FOLATE, FERRITIN, TIBC, IRON, RETICCTPCT in the last 72 hours. Urine analysis:    Component Value Date/Time   COLORURINE YELLOW 01/29/2016 0051   APPEARANCEUR CLEAR 01/29/2016 0051   LABSPEC 1.016 01/29/2016 0051   PHURINE 5.5 01/29/2016 0051   GLUCOSEU NEGATIVE 01/29/2016 0051   HGBUR NEGATIVE 01/29/2016 0051   BILIRUBINUR NEGATIVE 01/29/2016 0051   KETONESUR NEGATIVE 01/29/2016 0051   PROTEINUR NEGATIVE 01/29/2016 0051   UROBILINOGEN 1.0 01/15/2013 1135   NITRITE NEGATIVE 01/29/2016 0051   LEUKOCYTESUR NEGATIVE 01/29/2016 0051   Sepsis Labs: @LABRCNTIP (procalcitonin:4,lacticidven:4) )No results found for this or any previous visit (from the past 240 hour(s)).   Radiological Exams on Admission: Dg Chest 2 View  Result Date: 11/04/2017 CLINICAL DATA:  Intermittent mid and LEFT chest pain with shortness of breath for 3 days, pain radiating down both arms, increased shortness of breath with activity, history hypertension, coronary artery disease post NSTEMI and CABG EXAM: CHEST - 2 VIEW COMPARISON:  09/20/2017 FINDINGS: Normal heart size post CABG. Mediastinal contours and pulmonary vascularity normal. Calcified granuloma RIGHT upper lobe. BILATERAL nipple shadows, unchanged. No infiltrate, pleural effusion or pneumothorax. Osseous structures unremarkable. IMPRESSION: No acute abnormalities. Electronically Signed    By: Ulyses Southward M.D.   On: 11/04/2017 11:11    EKG: Independently reviewed. Sinus tach at 120 bpm  Assessment/Plan Principal Problem:   Chest pain - with known history of CAD/ CABG and stent to RCA - ED has spoken with Dr Donnie Aho who recommends admit to [facility] and he will evaluate the patient there - check Troponin levels (first is negative), place Nitro paste for ongoing pain, cont ASA/ Plavix, statin    Active Problems:   Nausea & vomiting - may be causing  above pain- I feel it may be related to use of BC powders which I have advised him to stop using- his allergy list also mentions that Ibuprofen causes upset stomach and I have explained how Ibuprofen is related to [facility] powders - will order Pepcid & GI cocktail  H/o PAD -cont ASA/ Plavix, Statin  HTN - cont Amlodipine and Lopressor  Smoker - on Nicotine patch which I will continue    DVT prophylaxis: Lovenox Code Status: full code  Family Communication:   Disposition Plan: telemetry at [facility] called: ED spoke with Cardiology/ Dr Donnie Aho  Admission status: observation    Calvert Cantor MD [facility] Pager: [email or web address] Password [email or web address] [phone], please contact night-coverage   11/04/2017, 2:16 PM

## 2017-11-04 NOTE — ED Provider Notes (Signed)
[facility]-EMERGENCY DEPT Provider Note   CSN: [id number] Arrival date & time: 11/04/17  1036     History   Chief Complaint Chief Complaint  Patient presents with  . Chest Pain  . Shortness of Breath    HPI Douglas [name] is a 52 y.o. male.  The history is provided by the patient.  Chest Pain   This is a recurrent problem. The current episode started 1 to 2 hours ago. The problem occurs daily. The problem has been gradually worsening. The pain is associated with exertion. The pain is present in the substernal region. The pain is at a severity of 8/10. The pain is severe. The quality of the pain is described as exertional and pressure-like. The pain radiates to the left arm and right arm. The symptoms are aggravated by exertion. Associated symptoms include shortness of breath. Pertinent negatives include no abdominal pain, no back pain, no cough, no diaphoresis, no exertional chest pressure, no fever, no lower extremity edema, no nausea, no orthopnea, no palpitations, no sputum production, no syncope and no vomiting. He has tried rest and nitroglycerin for the symptoms. The treatment provided mild relief.  His past medical history is significant for CAD.  Pertinent negatives for past medical history include no seizures.    Past Medical History:  Diagnosis Date  . Anginal pain (HCC)   . Coronary artery disease   . GERD (gastroesophageal reflux disease)   . History of acute pancreatitis 04/06/2015  . NSTEMI (non-ST elevated myocardial infarction) (HCC) 01/2014  . Pancreatitis   . PTSD (post-traumatic stress disorder)   . Tobacco abuse     Patient Active Problem List   Diagnosis Date Noted  . Chest pain 08/18/2017  . Tooth abscess 08/18/2017  . Aortic atherosclerosis (HCC) 04/10/2017  . Patient's noncompliance with other medical treatment and regimen 04/10/2017  . CAD (coronary artery disease), native coronary artery 03/15/2016  . Pancreatic divisum  04/18/2015  . History of acute pancreatitis 04/06/2015  . Alcohol abuse 03/09/2015  . Fatty liver 03/09/2015  . Tobacco abuse     Past Surgical History:  Procedure Laterality Date  . CARDIAC CATHETERIZATION N/A 03/14/2016   Procedure: Left Heart Cath and Coronary Angiography;  Surgeon: Kathleene Hazel, MD;  Location: [facility] INVASIVE CV LAB;  Service: Cardiovascular;  Laterality: N/A;  . CORONARY ANGIOPLASTY WITH STENT PLACEMENT  01/18/2014   "1"  . CORONARY ARTERY BYPASS GRAFT N/A 03/19/2016   Procedure: CORONARY ARTERY BYPASS GRAFTING (CABG), ON PUMP, TIMES FIVE, USING LEFT INTERNAL MAMMARY ARTERY AND RIGHT GREATER SAPHENOUS VEIN HARVESTED ENDOSCOPICALLY;  Surgeon: Delight Ovens, MD;  Location: [facility] OR;  Service: Open Heart Surgery;  Laterality: N/A;  LIMA-LAD SVG-OM SVG-DIAG SEQ SVG-PD-PL  . CORONARY BALLOON ANGIOPLASTY N/A 04/11/2017   Procedure: CORONARY BALLOON ANGIOPLASTY;  Surgeon: Yvonne Kendall, MD;  Location: [facility] INVASIVE CV LAB;  Service: Cardiovascular;  Laterality: N/A;  Distal RCA  . INTRAVASCULAR PRESSURE WIRE/FFR STUDY N/A 04/11/2017   Procedure: INTRAVASCULAR PRESSURE WIRE/FFR STUDY;  Surgeon: Yvonne Kendall, MD;  Location: [facility] INVASIVE CV LAB;  Service: Cardiovascular;  Laterality: N/A;  . LEFT HEART CATH AND CORS/GRAFTS ANGIOGRAPHY N/A 04/11/2017   Procedure: LEFT HEART CATH AND CORS/GRAFTS ANGIOGRAPHY;  Surgeon: Yvonne Kendall, MD;  Location: [facility] INVASIVE CV LAB;  Service: Cardiovascular;  Laterality: N/A;  . LEFT HEART CATHETERIZATION WITH CORONARY ANGIOGRAM N/A 01/18/2014   Procedure: LEFT HEART CATHETERIZATION WITH CORONARY ANGIOGRAM;  Surgeon: Runell Gess, MD;  Location: [facility] CATH LAB;  Service:  Cardiovascular;  Laterality: N/A;  . TEE WITHOUT CARDIOVERSION N/A 03/19/2016   Procedure: TRANSESOPHAGEAL ECHOCARDIOGRAM (TEE);  Surgeon: Delight Ovens, MD;  Location: [facility] OR;  Service: Open Heart Surgery;  Laterality: N/A;        Home Medications    Prior to  Admission medications   Medication Sig Start Date End Date Taking? Authorizing Provider  amLODipine (NORVASC) 10 MG tablet Take 1 tablet (10 mg total) by mouth daily. 08/22/17  Yes Berton Mount I, MD  aspirin 325 MG tablet Take 325 mg by mouth daily.   Yes [provider]  Aspirin-Salicylamide-Caffeine (BC HEADACHE PO) Take 1 packet by mouth 2 (two) times daily.   Yes [provider]  metoprolol tartrate (LOPRESSOR) 25 MG tablet Take 1 tablet (25 mg total) by mouth 2 (two) times daily. 08/21/17  Yes Berton Mount I, MD  nicotine (NICODERM CQ - DOSED IN MG/24 HOURS) 21 mg/24hr patch Place 21 mg onto the skin daily.   Yes [provider]  nitroGLYCERIN (NITROSTAT) 0.4 MG SL tablet Place 1 tablet (0.4 mg total) under the tongue every 5 (five) minutes as needed for chest pain. 04/12/17  Yes Lonia Blood, MD  amoxicillin-clavulanate (AUGMENTIN) 875-125 MG tablet Take 1 tablet by mouth every 12 (twelve) hours. Patient not taking: Reported on 11/04/2017 08/21/17   Berton Mount I, MD  aspirin 81 MG chewable tablet Chew 1 tablet (81 mg total) by mouth daily. Patient not taking: Reported on 11/04/2017 04/13/17   Lonia Blood, MD  clopidogrel (PLAVIX) 75 MG tablet Take 1 tablet (75 mg total) by mouth daily with breakfast. Patient not taking: Reported on 11/04/2017 04/13/17   Lonia Blood, MD  cyclobenzaprine (FLEXERIL) 10 MG tablet Take 1 tablet (10 mg total) by mouth 2 (two) times daily as needed for muscle spasms. Patient not taking: Reported on 11/04/2017 09/20/17   Linwood Dibbles, MD  diclofenac sodium (VOLTAREN) 1 % GEL Apply 4 g topically 4 (four) times daily. Patient not taking: Reported on 11/04/2017 09/13/17   Palumbo, April, MD  etodolac (LODINE) 300 MG capsule Take 1 capsule (300 mg total) by mouth every 8 (eight) hours. Patient not taking: Reported on 11/04/2017 09/20/17   Linwood Dibbles, MD  rosuvastatin (CRESTOR) 10 MG tablet Take 1 tablet (10 mg total) by mouth  daily at 6 PM. Patient not taking: Reported on 11/04/2017 04/12/17   Lonia Blood, MD    Family History Family History  Problem Relation Age of Onset  . Cancer Mother        Lung cancer  . Coronary artery disease Mother   . Heart failure Maternal Grandmother     Social History Social History   Tobacco Use  . Smoking status: Former Smoker    Packs/day: 1.00    Years: 31.00    Pack years: 31.00    Types: Cigarettes  . Smokeless tobacco: Never Used  Substance Use Topics  . Alcohol use: Not Currently    Alcohol/week: 1.0 standard drinks    Types: 1 Cans of beer per week    Comment: occ  . Drug use: Not Currently    Types: Marijuana    Comment: denies 06/17/2015     Allergies   Ibuprofen and Shellfish allergy   Review of Systems Review of Systems  Constitutional: Negative for chills, diaphoresis and fever.  HENT: Negative for ear pain and sore throat.   Eyes: Negative for pain and visual disturbance.  Respiratory: Positive for shortness of breath.  Negative for cough and sputum production.   Cardiovascular: Positive for chest pain. Negative for palpitations, orthopnea and syncope.  Gastrointestinal: Negative for abdominal pain, nausea and vomiting.  Genitourinary: Negative for dysuria and hematuria.  Musculoskeletal: Negative for arthralgias and back pain.  Skin: Negative for color change and rash.  Neurological: Negative for seizures and syncope.  All other systems reviewed and are negative.    Physical Exam Updated Vital Signs  ED Triage Vitals  Enc Vitals Group     BP 11/04/17 1048 (!) 167/115     Pulse Rate 11/04/17 1048 (!) 120     Resp 11/04/17 1048 14     Temp 11/04/17 1048 97.9 F (36.6 C)     Temp Source 11/04/17 1048 Oral     SpO2 11/04/17 1048 100 %     Weight 11/04/17 1049 155 lb (70.3 kg)     Height 11/04/17 1049 6' (1.829 m)     Head Circumference --      Peak Flow --      Pain Score 11/04/17 1048 8     Pain Loc --      Pain Edu? --       Excl. in GC? --     Physical Exam  Constitutional: He appears well-developed and well-nourished.  HENT:  Head: Normocephalic and atraumatic.  Eyes: Pupils are equal, round, and reactive to light. Conjunctivae and EOM are normal.  Neck: Normal range of motion. Neck supple.  Cardiovascular: Normal rate, regular rhythm, intact distal pulses and normal pulses.  No murmur heard. Pulmonary/Chest: Effort normal and breath sounds normal. No respiratory distress. He has no decreased breath sounds.  Abdominal: Soft. There is no tenderness.  Musculoskeletal: Normal range of motion. He exhibits no edema.       Right lower leg: Normal. He exhibits no tenderness and no edema.       Left lower leg: Normal. He exhibits no tenderness and no edema.  Neurological: He is alert.  Skin: Skin is warm and dry.  Psychiatric: He has a normal mood and affect.  Nursing note and vitals reviewed.    ED Treatments / Results  Labs (all labs ordered are listed, but only abnormal results are displayed) Labs Reviewed  CBC - Abnormal; Notable for the following components:      Result Value   Hemoglobin 18.3 (*)    HCT 52.4 (*)    MCH 34.6 (*)    All other components within normal limits  COMPREHENSIVE METABOLIC PANEL - Abnormal; Notable for the following components:   Glucose, Bld 113 (*)    All other components within normal limits  HIV ANTIBODY (ROUTINE TESTING)  I-STAT TROPONIN, ED    EKG EKG Interpretation  Date/Time:  Monday November 04 2017 10:47:18 EDT Ventricular Rate:  120 PR Interval:    QRS Duration: 94 QT Interval:  318 QTC Calculation: 450 R Axis:   80 Text Interpretation:  Sinus tachycardia Probable left atrial enlargement Confirmed by Virgina Norfolk [phone]) on 11/04/2017 11:23:41 AM   Radiology Dg Chest 2 View  Result Date: 11/04/2017 CLINICAL DATA:  Intermittent mid and LEFT chest pain with shortness of breath for 3 days, pain radiating down both arms, increased shortness of breath  with activity, history hypertension, coronary artery disease post NSTEMI and CABG EXAM: CHEST - 2 VIEW COMPARISON:  09/20/2017 FINDINGS: Normal heart size post CABG. Mediastinal contours and pulmonary vascularity normal. Calcified granuloma RIGHT upper lobe. BILATERAL nipple shadows, unchanged. No infiltrate, pleural effusion or  pneumothorax. Osseous structures unremarkable. IMPRESSION: No acute abnormalities. Electronically Signed   By: Ulyses SouthwardMark  Boles M.D.   On: 11/04/2017 11:11    Procedures Procedures (including critical care time)  Medications Ordered in ED Medications  amLODipine (NORVASC) tablet 10 mg (has no administration in time range)  aspirin tablet 325 mg (has no administration in time range)  metoprolol tartrate (LOPRESSOR) tablet 25 mg (has no administration in time range)  nicotine (NICODERM CQ - dosed in mg/24 hours) patch 21 mg (has no administration in time range)  nitroGLYCERIN (NITROGLYN) 2 % ointment 0.5 inch (has no administration in time range)  nitroGLYCERIN (NITROSTAT) SL tablet 0.4 mg (has no administration in time range)  acetaminophen (TYLENOL) tablet 650 mg (has no administration in time range)  ondansetron (ZOFRAN) injection 4 mg (has no administration in time range)  famotidine (PEPCID) tablet 20 mg (has no administration in time range)  enoxaparin (LOVENOX) injection 40 mg (has no administration in time range)  nitroGLYCERIN (NITROSTAT) SL tablet 0.4 mg (0.4 mg Sublingual Given 11/04/17 1219)  aspirin chewable tablet 324 mg (324 mg Oral Given 11/04/17 1126)     Initial Impression / Assessment and Plan / ED Course  I have reviewed the triage vital signs and the nursing notes.  Pertinent labs & imaging results that were available during my care of the patient were reviewed by me and considered in my medical decision making (see chart for details).     Minerva EndsJonathan Chick is a 52 year old male with history of CAD status post CABG who presents to the ED with chest  pain, leg pain.  Patient with unremarkable vitals except for mild tachycardia. Patient with exertional chest pain for the last several days.  Continues to have pain in his calf with ambulation and is known to have possible peripheral arterial disease.  Has not followed up with vascular surgery or cardiology recently.  Patient with EKG that does not show any acute ST changes, no ischemic changes.  Patient with clear breath sounds.  Exam is overall unremarkable.  No significant anemia, electrolyte abnormality, kidney injury.  Troponin within normal limits.  Chest x-ray shows no signs of pneumonia, pneumothorax, pleural effusion.  Patient given aspirin and nitroglycerin with improvement of pain.  Suspect patient has peripheral arterial disease and concern for ACS.  No concern for acute limb ischemia at this time.  Dr. Donnie Ahoilley with cardiology was consulted and he recommends admission to [facility] for further ACS rule out.  Hospitalist was consulted and made aware of the plan and will admit.  Patient hemodynamically stable throughout my care.  This chart was dictated using voice recognition software.  Despite best efforts to proofread,  errors can occur which can change the documentation meaning.   Final Clinical Impressions(s) / ED Diagnoses   Final diagnoses:  Chest pain, unspecified type    ED Discharge Orders    None       Virgina NorfolkCuratolo, Derreck Wiltsey, DO 11/04/17 1357

## 2017-11-05 ENCOUNTER — Observation Stay (HOSPITAL_BASED_OUTPATIENT_CLINIC_OR_DEPARTMENT_OTHER): Payer: Self-pay

## 2017-11-05 ENCOUNTER — Observation Stay (HOSPITAL_COMMUNITY): Payer: Self-pay

## 2017-11-05 DIAGNOSIS — Z951 Presence of aortocoronary bypass graft: Secondary | ICD-10-CM

## 2017-11-05 DIAGNOSIS — Z955 Presence of coronary angioplasty implant and graft: Secondary | ICD-10-CM

## 2017-11-05 DIAGNOSIS — I209 Angina pectoris, unspecified: Secondary | ICD-10-CM

## 2017-11-05 DIAGNOSIS — I739 Peripheral vascular disease, unspecified: Secondary | ICD-10-CM

## 2017-11-05 DIAGNOSIS — R079 Chest pain, unspecified: Secondary | ICD-10-CM

## 2017-11-05 DIAGNOSIS — I70212 Atherosclerosis of native arteries of extremities with intermittent claudication, left leg: Secondary | ICD-10-CM

## 2017-11-05 LAB — ECHOCARDIOGRAM LIMITED
Height: 72 in
Weight: 2424 oz

## 2017-11-05 LAB — HIV ANTIBODY (ROUTINE TESTING W REFLEX): HIV SCREEN 4TH GENERATION: NONREACTIVE

## 2017-11-05 MED ORDER — SODIUM CHLORIDE 0.9% FLUSH: 3.0000 mL | INTRAVENOUS | Status: DC | PRN

## 2017-11-05 MED ORDER — ROSUVASTATIN CALCIUM 20 MG PO TABS
40.0000 mg | ORAL_TABLET | Freq: Every day | ORAL | Status: DC
  Administered 2017-11-05: 40 mg via ORAL
  Filled 2017-11-05: qty 2

## 2017-11-05 MED ORDER — OXYCODONE HCL 5 MG PO TABS
5.0000 mg | ORAL_TABLET | ORAL | Status: AC | PRN
  Administered 2017-11-05 – 2017-11-06 (×3): 5 mg via ORAL
  Filled 2017-11-05 (×3): qty 1

## 2017-11-05 MED ORDER — ROSUVASTATIN CALCIUM 10 MG PO TABS: 10.0000 mg | ORAL_TABLET | Freq: Every day | ORAL | Status: DC

## 2017-11-05 MED ORDER — SODIUM CHLORIDE 0.9% FLUSH
3.0000 mL | Freq: Two times a day (BID) | INTRAVENOUS | Status: DC
  Administered 2017-11-05: 3 mL via INTRAVENOUS

## 2017-11-05 MED ORDER — SODIUM CHLORIDE 0.9 % WEIGHT BASED INFUSION
1.0000 mL/kg/h | INTRAVENOUS | Status: DC
  Administered 2017-11-06: 1 mL/kg/h via INTRAVENOUS

## 2017-11-05 MED ORDER — SODIUM CHLORIDE 0.9 % IV SOLN: 250.0000 mL | INTRAVENOUS | Status: DC | PRN

## 2017-11-05 MED ORDER — ASPIRIN 81 MG PO CHEW
81.0000 mg | CHEWABLE_TABLET | ORAL | Status: AC
  Administered 2017-11-06: 81 mg via ORAL
  Filled 2017-11-05: qty 1

## 2017-11-05 MED ORDER — SODIUM CHLORIDE 0.9 % WEIGHT BASED INFUSION
3.0000 mL/kg/h | INTRAVENOUS | Status: DC
  Administered 2017-11-06: 3 mL/kg/h via INTRAVENOUS

## 2017-11-05 MED ORDER — CLOPIDOGREL BISULFATE 75 MG PO TABS
75.0000 mg | ORAL_TABLET | Freq: Every day | ORAL | Status: DC
  Administered 2017-11-05 – 2017-11-07 (×3): 75 mg via ORAL
  Filled 2017-11-05 (×3): qty 1

## 2017-11-05 NOTE — Care Management Note (Signed)
Case Management Note  Patient Details  Name: Douglas [name] MRN: [id number] Date of Birth: 03-29-1965  Subjective/Objective: Pt presented for Chest Pain. 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 CM did receive consult for Medication Needs. At the time of visit patient's God Father at the bedside.                     Action/Plan: CM did discuss with patient in regards to getting established in the community with PCP/ medication assistance since he has minimal funds. [place]. God Father has scheduled patient an [facility] with Dr Gildardo Crankerharles [name] and he states will assist with cost of medications. No further needs from CM at this time.     Expected Discharge Date:           Expected Discharge Plan:  Home/Self Care  In-House Referral:  NA  Discharge planning Services  CM Consult, Medication Assistance  Post Acute Care Choice:  NA Choice offered to:  NA  DME Arranged:  N/A DME Agency:  NA  HH Arranged:  NA HH Agency:  NA  Status of Service:  Completed, signed off  If discussed at Long Length of Stay Meetings, dates discussed:    Additional Comments:  Douglas [name], Douglas Coulthard Kaye, RN 11/05/2017, 10:22 AM

## 2017-11-05 NOTE — Progress Notes (Addendum)
PROGRESS NOTE  Douglas [name] WUJ:[id number] DOB: [birth year] DOA: 11/04/2017 PCP: Patient, No Pcp Per  HPI/Brief Narrative  Douglas [name] is a 52 y.o. year old male with medical history significant for CAD with previous CABG (2018), last PCI done to RCA on 04/11/2017), vascular disease, hyperlipidemia, tobacco and alcohol abuse who presented on 11/04/2017 with chest pain x 3 days with associated nausea and emesis, radiation down both legs.   For months, his left leg is so painful he can barely walk 100 yards without pain and having to sit down. 3 days ago started while walking to the store noticing tight and sharp and pressure in his chest that radiates to both arms. States it is very similar to the pain he had in 2018 before getting his CABG. At home, nitro helped. Resting also helped both chest pain and leg pain. Any exertion makes pain worse. On the morning of 11/04/17 he got sick and threw up which prompted him to come to the ED. He's also noticed worsening fatigue for a few months now.   In the ED he was afebrile, respiratory rate 24, heart rate 120 blood pressure range 149/90-167/115.  Initial troponin was 0.02, next troponin was less than 0.03.  CBC notable for hemoglobin of 18.3.  CMP unremarkable.  Chest x-ray showed no acute abnormalities.  Patient was given nitro sublingual tabs x3 with nitro paste..  Initial EKG showed normal sinus rhythm with no acute ischemic changes.  Repeat EKG on 8/27 similar morphology.  Subjective Still having 7/8 chest pain. Nitro paste just giving a headache.   Assessment/Plan:  Chest pain with typical features. Has known CAD disease and prior stents and pain similar to prior. Troponin trend has been negative so far with no ischemic changes on EKG. Evaluate for change in EF and wall motion dysfunction on TTE.  His BP was very high on arrival which could also be contributing but still having chest pain despite BP being 124/80 this morning. Pain control with  nitro PRN. Continue home aspirin, resume crestor ( not taking at home) monitor on telemetry. ADDENDUM: Spoke with primary cardiologist Dr. Viann FishSpencer Tilley: patient has long history of nonadherence and has never followed up, even after last PCI this past January. He is concerned for recurrent restenosis of previous stent  History of PAD, concern for new claudication in left leg. Has a previous diagnosis in right leg. No having leg symptoms in left leg with inability to walk 100 feet without pain requiring him to stop for relief, concerning for claudication. Still palpable pulses and warm to touch no signs of acute limb ischemia. Will discuss with vascular team while in hospital to determine further evaluation.  CAD with previous CABG(2018),  Non-adherent to medication regimen. Last admitted 03/2017 for in-stent restenosis of RCA that required new stent.  He did not follow up after hospitalization and has been off plavix for several months after initial script ran out.   Nausea and vomiting, resolved. Likely secondary to above. Monitor, antiemetics PRN  HTN, at goal currently. Quite elevated on arrival. But currently 124/80  Continue home amlodipine and Lopressor 25 mg twice daily  Tobacco abuse Nicotine patch  History of ethanol abuse. Monitor on CIWA  Code Status: FULL CODE   Family Communication: Father at bedside   Disposition Plan: TTE, monitor BP,NPO at midnight plan for likely cath on 11/06/17   Consultants:  Cardiology (Dr. Viann FishSpencer Tilley)    Procedures:  None   Antimicrobials: Anti-infectives (From admission, onward)  None         Cultures:  none  Telemetry:yes  DVT prophylaxis: Lovenox   Objective: Vitals:   11/04/17 2117 11/04/17 2315 11/04/17 2316 11/05/17 0516  BP: 121/80   124/80  Pulse: 85 71 69 66  Resp:    17  Temp:    97.7 F (36.5 C)  TempSrc:    Oral  SpO2: 95% 99%  97%  Weight:    68.7 kg  Height:        Intake/Output Summary (Last 24  hours) at 11/05/2017 0805 Last data filed at 11/05/2017 1610 Gross per 24 hour  Intake 240 ml  Output 675 ml  Net -435 ml   Filed Weights   11/04/17 1049 11/05/17 0516  Weight: 70.3 kg 68.7 kg    Exam:  Constitutional:normal appearing male Eyes: EOMI, anicteric, normal conjunctivae ENMT: Oropharynx with moist mucous membranes, normal dentition Cardiovascular: RRR no MRGs, with no peripheral edema, palpable pulses distally in legs, no chest wall tenderness Respiratory: Normal respiratory effort, clear breath sounds  Skin: No rash ulcers, or lesions. Without skin tenting  Neurologic: Grossly no focal neuro deficit. Psychiatric:Appropriate affect, and mood. Mental status AAOx3  Data Reviewed: CBC: Recent Labs  Lab 11/04/17 1132  WBC 9.0  HGB 18.3*  HCT 52.4*  MCV 99.1  PLT 256   Basic Metabolic Panel: Recent Labs  Lab 11/04/17 1132  NA 138  K 4.8  CL 105  CO2 24  GLUCOSE 113*  BUN 8  CREATININE 1.05  CALCIUM 9.2   GFR: Estimated Creatinine Clearance: 81.8 mL/min (by C-G formula based on SCr of 1.05 mg/dL). Liver Function Tests: Recent Labs  Lab 11/04/17 1132  AST 28  ALT 21  ALKPHOS 98  BILITOT 0.7  PROT 6.8  ALBUMIN 3.7   No results for input(s): LIPASE, AMYLASE in the last 168 hours. No results for input(s): AMMONIA in the last 168 hours. Coagulation Profile: No results for input(s): INR, PROTIME in the last 168 hours. Cardiac Enzymes: Recent Labs  Lab 11/04/17 1525  TROPONINI <0.03   BNP (last 3 results) No results for input(s): PROBNP in the last 8760 hours. HbA1C: No results for input(s): HGBA1C in the last 72 hours. CBG: No results for input(s): GLUCAP in the last 168 hours. Lipid Profile: No results for input(s): CHOL, HDL, LDLCALC, TRIG, CHOLHDL, LDLDIRECT in the last 72 hours. Thyroid Function Tests: No results for input(s): TSH, T4TOTAL, FREET4, T3FREE, THYROIDAB in the last 72 hours. Anemia Panel: No results for input(s):  VITAMINB12, FOLATE, FERRITIN, TIBC, IRON, RETICCTPCT in the last 72 hours. Urine analysis:    Component Value Date/Time   COLORURINE YELLOW 01/29/2016 0051   APPEARANCEUR CLEAR 01/29/2016 0051   LABSPEC 1.016 01/29/2016 0051   PHURINE 5.5 01/29/2016 0051   GLUCOSEU NEGATIVE 01/29/2016 0051   HGBUR NEGATIVE 01/29/2016 0051   BILIRUBINUR NEGATIVE 01/29/2016 0051   KETONESUR NEGATIVE 01/29/2016 0051   PROTEINUR NEGATIVE 01/29/2016 0051   UROBILINOGEN 1.0 01/15/2013 1135   NITRITE NEGATIVE 01/29/2016 0051   LEUKOCYTESUR NEGATIVE 01/29/2016 0051   Sepsis Labs: @LABRCNTIP (procalcitonin:4,lacticidven:4)  )No results found for this or any previous visit (from the past 240 hour(s)).    Studies: Dg Chest 2 View  Result Date: 11/04/2017 CLINICAL DATA:  Intermittent mid and LEFT chest pain with shortness of breath for 3 days, pain radiating down both arms, increased shortness of breath with activity, history hypertension, coronary artery disease post NSTEMI and CABG EXAM: CHEST - 2 VIEW COMPARISON:  09/20/2017  FINDINGS: Normal heart size post CABG. Mediastinal contours and pulmonary vascularity normal. Calcified granuloma RIGHT upper lobe. BILATERAL nipple shadows, unchanged. No infiltrate, pleural effusion or pneumothorax. Osseous structures unremarkable. IMPRESSION: No acute abnormalities. Electronically Signed   By: Ulyses Southward M.D.   On: 11/04/2017 11:11    Scheduled Meds: . amLODipine  10 mg Oral Daily  . enoxaparin (LOVENOX) injection  40 mg Subcutaneous Q24H  . famotidine  20 mg Oral BID  . metoprolol tartrate  25 mg Oral BID  . nicotine  21 mg Transdermal QHS  . nitroGLYCERIN  0.5 inch Topical Q6H    Continuous Infusions:   LOS: 0 days     Laverna Peace, MD [facility] Pager [phone]  If 7PM-7AM, please contact night-coverage [email or web address] Password [facility] 11/05/2017, 8:05 AM

## 2017-11-05 NOTE — Progress Notes (Signed)
Patient had a 9 beat NSVT tonight, notified MD.

## 2017-11-05 NOTE — Consult Note (Addendum)
Hospital Consult    Reason for Consult:  claudication Requesting Physician:  Mal Mistyetty MRN #:  [id number]  History of Present Illness: This is a 52 y.o. male who was admitted yesterday with chest pain.  He has a hx of CABG x 5 in 2018.   He is being followed by cardiology and has unstable angina.  He has CAD with distal left main and 3VCAD with occlusion of the vein grafts but mammary to LAD is patent.  He was on Plavix but has been non compliant with this since his rx ran out.   He is scheduled for cardiac catheterization tomorrow.    He states that he has been having left leg pain with walking for about 4 months.  He states that he can walk for about 15 minutes and then starts to get pain in his entire leg from his ankle up to his groin.  He states he has a little pain in the right leg but not nearly as much.  He states that after he rest for a few minutes, he can again start walking and develops the same sx again in a little less time than the original 15 minutes.  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  He says he would like to return to work.  He states he officially stopped smoking in January, however he says he has had some cigarettes since then.  He uses nicotine patches to help him quit smoking.  He said Dr. Donnie Ahoilley also discussed with him the importance of tobacco cessation.  He denies any non healing wounds.   Hospital medications consist of adult Asprin, plavix, CCB, BB and statin.  He is on Lovenox for DVT prophylaxis.    Past Medical History:  Diagnosis Date  . Anginal pain (HCC)   . Coronary artery disease   . GERD (gastroesophageal reflux disease)   . History of acute pancreatitis 04/06/2015  . NSTEMI (non-ST elevated myocardial infarction) (HCC) 01/2014  . Pancreatitis   . PTSD (post-traumatic stress disorder)   . Tobacco abuse     Past Surgical History:  Procedure Laterality Date  . CARDIAC CATHETERIZATION N/A 03/14/2016   Procedure: Left Heart Cath and  Coronary Angiography;  Surgeon: Kathleene Hazelhristopher D McAlhany, MD;  [facility];  Service: Cardiovascular;  Laterality: N/A;  . CORONARY ANGIOPLASTY WITH STENT PLACEMENT  01/18/2014   "1"  . CORONARY ARTERY BYPASS GRAFT N/A 03/19/2016   Procedure: CORONARY ARTERY BYPASS GRAFTING (CABG), ON PUMP, TIMES FIVE, USING LEFT INTERNAL MAMMARY ARTERY AND RIGHT GREATER SAPHENOUS VEIN HARVESTED ENDOSCOPICALLY;  Surgeon: Delight OvensEdward B Gerhardt, MD;  [facility];  Service: Open Heart Surgery;  Laterality: N/A;  LIMA-LAD SVG-OM SVG-DIAG SEQ SVG-PD-PL  . CORONARY BALLOON ANGIOPLASTY N/A 04/11/2017   Procedure: CORONARY BALLOON ANGIOPLASTY;  Surgeon: Yvonne KendallEnd, Christopher, MD;  Location: [facility] IN[facility] CV LAB;  Service: Cardiovascular;  Laterality: N/A;  Distal RCA  . INTRAVASCULAR PRESSURE WIRE/FFR STUDY N/A 04/11/2017   Procedure: INTRAVASCULAR PRESSURE WIRE/FFR STUDY;  Surgeon: Yvonne KendallEnd, Christopher, MD;  Location: [facility] INVASIVE CV LAB;  Service: Cardiovascular;  Laterality: N/A;  . LEFT HEART CATH AND CORS/GRAFTS ANGIOGRAPHY N/A 04/11/2017   Procedure: LEFT HEART CATH AND CORS/GRAFTS ANGIOGRAPHY;  Surgeon: Yvonne KendallEnd, Christopher, MD;  Location: [facility] INVAS[facility] CV LAB;  Service: Cardiovascular;  Laterality: N/A;  . LEFT HEART CATHETERIZATION WITH CORONARY ANGIOGRAM N/A 01/18/2014   Procedure: LEFT HEART CATHETERIZATION WITH CORONARY ANGIOGRAM;  Surgeon: Runell GessJonathan J Berry, MD;  [facility];  Service: Cardiovascular;  Laterality: N/A;  . TEE WITHOUT CARDIOVERSION N/A 03/19/2016   Procedure: TRANSESOPHAGEAL ECHOCARDIOGRAM (TEE);  Surgeon: Delight Ovens, MD;  Location: [facility] OR;  Service: Open Heart Surgery;  Laterality: N/A;    Allergies  Allergen Reactions  . Ibuprofen Nausea And Vomiting    UPSETS STOMACH  . Shellfish Allergy Nausea And Vomiting    Prior to Admission medications   Medication Sig Start Date End Date Taking? Authorizing Provider  amLODipine (NORVASC) 10 MG tablet Take 1 tablet (10 mg total) by mouth  daily. 08/22/17  Yes Berton Mount I, MD  aspirin 325 MG tablet Take 325 mg by mouth daily.   Yes [provider]  Aspirin-Salicylamide-Caffeine (BC HEADACHE PO) Take 1 packet by mouth 2 (two) times daily.   Yes [provider]  metoprolol tartrate (LOPRESSOR) 25 MG tablet Take 1 tablet (25 mg total) by mouth 2 (two) times daily. 08/21/17  Yes Berton Mount I, MD  nicotine (NICODERM CQ - DOSED IN MG/24 HOURS) 21 mg/24hr patch Place 21 mg onto the skin daily.   Yes [provider]  nitroGLYCERIN (NITROSTAT) 0.4 MG SL tablet Place 1 tablet (0.4 mg total) under the tongue every 5 (five) minutes as needed for chest pain. 04/12/17  Yes Lonia Blood, MD  amoxicillin-clavulanate (AUGMENTIN) 875-125 MG tablet Take 1 tablet by mouth every 12 (twelve) hours. Patient not taking: Reported on 11/04/2017 08/21/17   Berton Mount I, MD  aspirin 81 MG chewable tablet Chew 1 tablet (81 mg total) by mouth daily. Patient not taking: Reported on 11/04/2017 04/13/17   Lonia Blood, MD  clopidogrel (PLAVIX) 75 MG tablet Take 1 tablet (75 mg total) by mouth daily with breakfast. Patient not taking: Reported on 11/04/2017 04/13/17   Lonia Blood, MD  cyclobenzaprine (FLEXERIL) 10 MG tablet Take 1 tablet (10 mg total) by mouth 2 (two) times daily as needed for muscle spasms. Patient not taking: Reported on 11/04/2017 09/20/17   Linwood Dibbles, MD  diclofenac sodium (VOLTAREN) 1 % GEL Apply 4 g topically 4 (four) times daily. Patient not taking: Reported on 11/04/2017 09/13/17   Palumbo, April, MD  etodolac (LODINE) 300 MG capsule Take 1 capsule (300 mg total) by mouth every 8 (eight) hours. Patient not taking: Reported on 11/04/2017 09/20/17   Linwood Dibbles, MD  rosuvastatin (CRESTOR) 10 MG tablet Take 1 tablet (10 mg total) by mouth daily at 6 PM. Patient not taking: Reported on 11/04/2017 04/12/17   Lonia Blood, MD    Social History   Socioeconomic History  . Marital status:  Divorced    Spouse name: Not on file  . Number of children: 2  . Years of education: Not on file  . Highest education level: Not on file  Occupational History  . Occupation: Statistician  Social Needs  . Financial resource strain: Not on file  . Food insecurity:    Worry: Not on file    Inability: Not on file  . Transportation needs:    Medical: Not on file    Non-medical: Not on file  Tobacco Use  . Smoking status: Former Smoker    Packs/day: 1.00    Years: 31.00    Pack years: 31.00    Types: Cigarettes  . Smokeless tobacco: Never Used  Substance and Sexual Activity  . Alcohol use: Not Currently    Alcohol/week: 1.0 standard drinks    Types: 1 Cans of beer per week    Comment:  occ  . Drug use: Not Currently    Types: Marijuana    Comment: denies 06/17/2015  . Sexual activity: Not Currently  Lifestyle  . Physical activity:    Days per week: Not on file    Minutes per session: Not on file  . Stress: Not on file  Relationships  . Social connections:    Talks on phone: Not on file    Gets together: Not on file    Attends religious service: Not on file    Active member of club or organization: Not on file    Attends meetings of clubs or organizations: Not on file    Relationship status: Not on file  . Intimate partner violence:    Fear of current or ex partner: Not on file    Emotionally abused: Not on file    Physically abused: Not on file    Forced sexual activity: Not on file  Other Topics Concern  . Not on file  Social History Narrative   Divorced.  2 sons.  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     Family History  Problem Relation Age of Onset  . Cancer Mother        Lung cancer  . Coronary artery disease Mother   . Heart failure Maternal Grandmother     ROS: [x]  Positive   [ ]  Negative   [ ]  All sytems reviewed and are negative  Cardiac: [x]  chest pain/pressure []  palpitations []  SOB lying flat []  DOE  Vascular: [x]   pain in legs while walking []  pain in legs at rest []  pain in legs at night []  non-healing ulcers []  hx of DVT []  swelling in legs  Pulmonary: []  productive cough []  asthma/wheezing []  home O2  Neurologic: []  weakness in []  arms []  legs []  numbness in []  arms []  legs []  hx of CVA []  mini stroke [] difficulty speaking or slurred speech []  temporary loss of vision in one eye []  dizziness  Hematologic: []  hx of cancer []  bleeding problems []  problems with blood clotting easily  Endocrine:   []  diabetes []  thyroid disease  GI []  vomiting blood []  blood in stool  GU: []  CKD/renal failure []  HD--[]  M/W/F or []  T/T/S []  burning with urination []  blood in urine  Psychiatric: []  anxiety []  depression  Musculoskeletal: []  arthritis []  joint pain  Integumentary: []  rashes []  ulcers  Constitutional: []  fever []  chills   Physical Examination  Vitals:   11/05/17 0516 11/05/17 0853  BP: 124/80 126/82  Pulse: 66 72  Resp: 17   Temp: 97.7 F (36.5 C)   SpO2: 97%    Body mass index is 20.55 kg/m.  General:  WDWN in NAD Gait: Not observed HENT: WNL, normocephalic Pulmonary: normal non-labored breathing, without Rales, rhonchi,  wheezing Cardiac: regular, without  Murmurs, rubs or gallops; without carotid bruits Abdomen:  soft, NT/ND, no masses Skin: without rashes Vascular Exam/Pulses:  Right Left  Radial 1+ (weak) 2+ (normal)  Ulnar Unable to palpate  Unable to palpate   Femoral 2+ (normal) Unable to palpate   Popliteal Unable to palpate  Unable to palpate   DP 2+ (normal) Unable to palpate   PT 1+ (weak)  Unable to palpate    Extremities: without ischemic changes, without Gangrene , without cellulitis; without open wounds;  Musculoskeletal: no muscle wasting or atrophy  Neurologic: A&O X 3;  No focal weakness or paresthesias are detected; speech is fluent/normal Psychiatric:  The pt has Normal affect. Lymph:  No inguinal  lymphadenopathy   CBC    Component Value Date/Time   WBC 9.0 11/04/2017 1132   RBC 5.29 11/04/2017 1132   HGB 18.3 (H) 11/04/2017 1132   HCT 52.4 (H) 11/04/2017 1132   HCT 45.1 04/08/2015 0350   PLT 256 11/04/2017 1132   MCV 99.1 11/04/2017 1132   MCH 34.6 (H) 11/04/2017 1132   MCHC 34.9 11/04/2017 1132   RDW 13.7 11/04/2017 1132   LYMPHSABS 2.5 09/20/2017 0352   MONOABS 0.9 09/20/2017 0352   EOSABS 0.3 09/20/2017 0352   BASOSABS 0.1 09/20/2017 0352    BMET    Component Value Date/Time   NA 138 11/04/2017 1132   K 4.8 11/04/2017 1132   CL 105 11/04/2017 1132   CO2 24 11/04/2017 1132   GLUCOSE 113 (H) 11/04/2017 1132   BUN 8 11/04/2017 1132   CREATININE 1.05 11/04/2017 1132   CALCIUM 9.2 11/04/2017 1132   GFRNONAA >60 11/04/2017 1132   GFRAA >60 11/04/2017 1132    COAGS: Lab Results  Component Value Date   INR 0.88 04/11/2017   INR 1.18 03/19/2016   INR 0.99 03/14/2016     Non-Invasive Vascular Imaging:   ABI's 11/05/17: ARTERIAL:  Left:  75-99% stenosis noted in the iliac artery.     ABI completed:  Resting right ankle-brachial index is within normal range. No evidence of significant right lower extremity arterial disease.                           Left:   Resting left ankle-brachial index indicates moderate left lower extremity arterial disease.      RIGHT    LEFT    PRESSURE WAVEFORM  PRESSURE WAVEFORM  BRACHIAL 109 Triphasic BRACHIAL 110 Triphasic  DP 122 Biphasic DP 86 monophsic  AT   AT    PT 117 Triphasic PT 86 Dampened monophasic  PER   PER    GREAT TOE  NA GREAT TOE  NA    RIGHT LEFT  ABI 1.11 0.78     Statin:  Yes.   Beta Blocker:  Yes.   Aspirin:  Yes.   ACEI:  No. ARB:  No. CCB use:  Yes Other antiplatelets/anticoagulants:  Yes.   Lovenox for DVT prophylaxis    ASSESSMENT/PLAN: This is a 52 y.o. male with unstable angina and claudication.    -pt to undergo cardiac catheterization tomorrow by Dr.  Clifton James  -pt does have left leg claudication with a decreased ABI of 0.78.  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   His right DP pulse is palpable and he does have a normal ABI on the right.   -he does not have any non healing wounds at this time.  He will need an angiogram at some point in the future but not until his heart issues are resolved.  -I also discussed with the pt the importance of smoking cessation.  -continue asa/statin    Doreatha Massed, PA-C [facility] [phone]  I agree with the above.  The patient has left leg claudication at 150 yards that is significantly limiting his quality of life.  Duplex shows left iliac stenosis.  I spoke with Dr. Kirke Corin about imaging this and fixing it if possible.  If not, I can arrange to do it after he recovers from  his cath.  WElls Nihaal Friesen

## 2017-11-05 NOTE — Progress Notes (Addendum)
VASCULAR LAB PRELIMINARY  ARTERIAL:  Left:  75-99% stenosis noted in the iliac artery.     ABI completed:  Resting right ankle-brachial index is within normal range. No evidence of significant right lower extremity arterial disease.                           Left:   Resting left ankle-brachial index indicates moderate left lower extremity arterial disease.      RIGHT    LEFT    PRESSURE WAVEFORM  PRESSURE WAVEFORM  BRACHIAL 109 Triphasic BRACHIAL 110 Triphasic  DP 122 Biphasic DP 86 monophsic  AT   AT    PT 117 Triphasic PT 86 Dampened monophasic  PER   PER    GREAT TOE  NA GREAT TOE  NA    RIGHT LEFT  ABI 1.11 0.78     HONGYING  Kayo Zion, RVT 11/05/2017, 3:56 PM

## 2017-11-05 NOTE — Consult Note (Signed)
Cardiology Consult Note  Admit date: 11/04/2017 Name: Douglas EndsJonathan Nakatani 52 y.o.  male DOB:  1965/08/13 MRN:  [id number]  Today's date:  11/05/2017  Referring Physician:   Dr. Caleb PoppNettey  Primary Physician:    None  Reason for Consultation:    angina  IMPRESSIONS: 1. Unstable angina-he is within the window for restenosis of a previous in-stent restenosis that has previously been treated with angioplasty.  This unfortunately has a high risk of recurrence.  Has a prior history of noncompliance with treatment including noncompliance with Plavix and has not taken Plavix since his original prescription ran out. 2.  CAD with distal left main and three-vessel disease with occlusion of the vein grafts patent mammary graft to the LAD 3.  Hypertensive heart disease 4.  Hyperlipidemia untreated prior to admission 5.  Significant claudication and peripheral vascular disease 6.  Severe issues with noncompliance  RECOMMENDATION: Long discussion with patient as well as father at bedtime regarding compliance issues.  He has never made a follow-up appointment in the office since 2015.  Discussed the importance of compliance with his medical regimen including cigarette smoking cessation, regular exercise, and the need to take his medicines on a regular basis.  Discussed that if he has interventions that he has a high likelihood of stent thrombosis if he was to not take his antiplatelet medicines.  With progressive angina discussed with interventional cardiology and they are agreeable to taking the patient back to the Cath Lab to really look at things.  Options would include angioplasty again although if he does have restenosis there is a high likelihood of recurrence with this approach alone.  Other option would be an additional stent.  He also needs to have a referral to vascular surgery and I would go ahead and get them to see him while he is in the hospital.  Discussed smoking cessation with him.  He also  needs a social work consult.  HISTORY: 52 year old male with significant premature CAD.  He hadPCI done of the right coronary artery in 2015 and is had both alcohol and tobacco abuse previously as well as recurrent pancreatitis and significant noncompliance.  He has never followed up in the office despite a PCI in 2015.  He had bypass grafting by Dr. Laneta SimmersBartle in January 2018 when he was found to have significant distal left main and three-vessel disease.  He was in a nursing home and has never followed up with cardiology following that.  He stopped taking all of his medicines and was admitted to the hospital in January of this year and was found to have occluded all of his vein grafts and had a patent mammary graft to the LAD.  He was initially treated medically but because of recurrence of chest pain earlier at catheterization had a in-stent restenosis of the right coronary artery that was treated with angioplasty by Dr. Okey DupreEnd.  Repeat stenting because of his noncompliance was not done.  The patient never followed up following that hospitalization and stated that he stopped smoking and has been using the patches although he does admit to smoking occasional cigarette if he runs out of patches.  He more recently has developed severe claudication involving his left leg but is quite limiting to him and can walk only a few 100 feet without having to stop.  He has a previous diagnosis of peripheral vascular disease in the right leg was diagnosed in January.  He was admitted to the hospital with chest discomfort following cessation of his  medicines when he had severe dental pain due to an abscess.  He presented to the [facility] long emergency room yesterday with progressive chest discomfort that started about 3 days ago described as midsternal chest discomfort with radiation to his arm and was quite limiting to him.  He was transferred to [facility] and I was asked to see him today.  His troponins and EKG have been  unremarkable.  Past Medical History:  Diagnosis Date  . Anginal pain (HCC)   . Coronary artery disease   . GERD (gastroesophageal reflux disease)   . History of acute pancreatitis 04/06/2015  . NSTEMI (non-ST elevated myocardial infarction) (HCC) 01/2014  . Pancreatitis   . PTSD (post-traumatic stress disorder)   . Tobacco abuse       Past Surgical History:  Procedure Laterality Date  . CARDIAC CATHETERIZATION N/A 03/14/2016   Procedure: Left Heart Cath and Coronary Angiography;  Surgeon: Kathleene Hazel, MD;  Location: [facility] INVASIVE CV LAB;  Service: Cardiovascular;  Laterality: N/A;  . CORONARY ANGIOPLASTY WITH STENT PLACEMENT  01/18/2014   "1"  . CORONARY ARTERY BYPASS GRAFT N/A 03/19/2016   Procedure: CORONARY ARTERY BYPASS GRAFTING (CABG), ON PUMP, TIMES FIVE, USING LEFT INTERNAL MAMMARY ARTERY AND RIGHT GREATER SAPHENOUS VEIN HARVESTED ENDOSCOPICALLY;  Surgeon: Delight Ovens, MD;  Location: [facility] OR;  Service: Open Heart Surgery;  Laterality: N/A;  LIMA-LAD SVG-OM SVG-DIAG SEQ SVG-PD-PL  . CORONARY BALLOON ANGIOPLASTY N/A 04/11/2017   Procedure: CORONARY BALLOON ANGIOPLASTY;  Surgeon: Yvonne Kendall, MD;  Location: [facility] INVASIVE CV LAB;  Service: Cardiovascular;  Laterality: N/A;  Distal RCA  . INTRAVASCULAR PRESSURE WIRE/FFR STUDY N/A 04/11/2017   Procedure: INTRAVASCULAR PRESSURE WIRE/FFR STUDY;  Surgeon: Yvonne Kendall, MD;  Location: [facility] INVASIVE CV LAB;  Service: Cardiovascular;  Laterality: N/A;  . LEFT HEART CATH AND CORS/GRAFTS ANGIOGRAPHY N/A 04/11/2017   Procedure: LEFT HEART CATH AND CORS/GRAFTS ANGIOGRAPHY;  Surgeon: Yvonne Kendall, MD;  Location: [facility] INVASIVE CV LAB;  Service: Cardiovascular;  Laterality: N/A;  . LEFT HEART CATHETERIZATION WITH CORONARY ANGIOGRAM N/A 01/18/2014   Procedure: LEFT HEART CATHETERIZATION WITH CORONARY ANGIOGRAM;  Surgeon: Runell Gess, MD;  Location: [facility] CATH LAB;  Service: Cardiovascular;  Laterality: N/A;  . TEE WITHOUT  CARDIOVERSION N/A 03/19/2016   Procedure: TRANSESOPHAGEAL ECHOCARDIOGRAM (TEE);  Surgeon: Delight Ovens, MD;  Location: [facility] OR;  Service: Open Heart Surgery;  Laterality: N/A;     Allergies:  is allergic to ibuprofen and shellfish allergy.   Medications: Prior to Admission medications   Medication Sig Start Date End Date Taking? Authorizing Provider  amLODipine (NORVASC) 10 MG tablet Take 1 tablet (10 mg total) by mouth daily. 08/22/17  Yes Berton Mount I, MD  aspirin 325 MG tablet Take 325 mg by mouth daily.   Yes [provider]  Aspirin-Salicylamide-Caffeine (BC HEADACHE PO) Take 1 packet by mouth 2 (two) times daily.   Yes [provider]  metoprolol tartrate (LOPRESSOR) 25 MG tablet Take 1 tablet (25 mg total) by mouth 2 (two) times daily. 08/21/17  Yes Berton Mount I, MD  nicotine (NICODERM CQ - DOSED IN MG/24 HOURS) 21 mg/24hr patch Place 21 mg onto the skin daily.   Yes [provider]  nitroGLYCERIN (NITROSTAT) 0.4 MG SL tablet Place 1 tablet (0.4 mg total) under the tongue every 5 (five) minutes as needed for chest pain. 04/12/17  Yes Lonia Blood, MD  amoxicillin-clavulanate (AUGMENTIN) 875-125 MG tablet Take 1 tablet by mouth every 12 (twelve) hours. Patient  not taking: Reported on 11/04/2017 08/21/17   Berton Mount I, MD  aspirin 81 MG chewable tablet Chew 1 tablet (81 mg total) by mouth daily. Patient not taking: Reported on 11/04/2017 04/13/17   Lonia Blood, MD  clopidogrel (PLAVIX) 75 MG tablet Take 1 tablet (75 mg total) by mouth daily with breakfast. Patient not taking: Reported on 11/04/2017 04/13/17   Lonia Blood, MD  cyclobenzaprine (FLEXERIL) 10 MG tablet Take 1 tablet (10 mg total) by mouth 2 (two) times daily as needed for muscle spasms. Patient not taking: Reported on 11/04/2017 09/20/17   Linwood Dibbles, MD  diclofenac sodium (VOLTAREN) 1 % GEL Apply 4 g topically 4 (four) times daily. Patient not taking: Reported on  11/04/2017 09/13/17   Palumbo, April, MD  etodolac (LODINE) 300 MG capsule Take 1 capsule (300 mg total) by mouth every 8 (eight) hours. Patient not taking: Reported on 11/04/2017 09/20/17   Linwood Dibbles, MD  rosuvastatin (CRESTOR) 10 MG tablet Take 1 tablet (10 mg total) by mouth daily at 6 PM. Patient not taking: Reported on 11/04/2017 04/12/17   Lonia Blood, MD    Family History: Family Status  Relation Name Status  . Father  (Not Specified)       muscular dystrophy  . Mother  Deceased       dementia/lung cancer  . Sister  Alive  . MGM  (Not Specified)    Social History:   reports that he has quit smoking. His smoking use included cigarettes. He has a 31.00 pack-year smoking history. He has never used smokeless tobacco. He reports that he drank about 1.0 standard drinks of alcohol per week. He reports that he has current or past drug history. Drug: Marijuana.   Social History   Social History Narrative   Divorced.  2 sons.  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  Physical Exam: BP 126/82   Pulse 72   Temp 97.7 F (36.5 C) (Oral)   Resp 17   Ht 6' (1.829 m)   Wt 68.7 kg   SpO2 97%   BMI 20.55 kg/m   General appearance: anxious appearing male in no acute distress Head: Normocephalic, without obvious abnormality, atraumatic Eyes: conjunctivae/corneas clear. PERRL, EOM's intact. Fundi benign. Neck: no adenopathy, no carotid bruit, no JVD and supple, symmetrical, trachea midline Lungs: clear to auscultation bilaterally Heart: regular rate and rhythm, S1, S2 normal, no murmur, click, rub or gallop Abdomen: soft, non-tender; bowel sounds normal; no masses,  no organomegaly Rectal: deferred Extremities: no edema noted, full range of motion bilaterally Pulses: right radial pulse is 2+, left  radial pulse difficult to feel, femoral pulses diminished pedal pulses diminished bilateral femoral bruits noted Skin: Skin color, texture, turgor normal. No rashes or lesions Neurologic: Grossly normal Psych: Alert and oriented x 3 Labs: CBC Recent Labs    11/04/17 1132  WBC 9.0  RBC 5.29  HGB 18.3*  HCT 52.4*  PLT 256  MCV 99.1  MCH 34.6*  MCHC 34.9  RDW 13.7   CMP  Recent Labs    11/04/17 1132  NA 138  K 4.8  CL 105  CO2 24  GLUCOSE 113*  BUN 8  CREATININE 1.05  CALCIUM 9.2  PROT 6.8  ALBUMIN 3.7  AST 28  ALT 21  ALKPHOS 98  BILITOT 0.7  GFRNONAA >60  GFRAA >60   BNP (last 3 results) BNP    Component Value Date/Time   BNP 27.9 03/14/2016 0630   Cardiac Panel (last 3 results) Troponin (Point of Care Test) Recent Labs    11/04/17 1141  TROPIPOC 0.02   Cardiac Panel (last 3 results) Recent Labs    11/04/17 1525  TROPONINI <0.03     Radiology:  No acute disease  EKG: normal Independently reviewed by me  Signed:  W. Ashley Royalty MD [facility]   Cardiology Consultant  11/05/2017, 1:12 PM

## 2017-11-05 NOTE — Progress Notes (Signed)
  Echocardiogram 2D Echocardiogram has been performed.  Janalyn HarderWest, Lyndie Vanderloop R 11/05/2017, 6:04 PM

## 2017-11-06 ENCOUNTER — Encounter (HOSPITAL_COMMUNITY): Payer: Self-pay | Admitting: Cardiovascular Disease

## 2017-11-06 ENCOUNTER — Encounter (HOSPITAL_COMMUNITY)
Admission: EM | Disposition: A | Discharge status: Home / Self Care | Payer: Self-pay | Source: Home / Self Care | Attending: Internal Medicine

## 2017-11-06 DIAGNOSIS — I25110 Atherosclerotic heart disease of native coronary artery with unstable angina pectoris: Secondary | ICD-10-CM

## 2017-11-06 DIAGNOSIS — M79606 Pain in leg, unspecified: Secondary | ICD-10-CM

## 2017-11-06 DIAGNOSIS — R079 Chest pain, unspecified: Secondary | ICD-10-CM | POA: Diagnosis present

## 2017-11-06 DIAGNOSIS — F1010 Alcohol abuse, uncomplicated: Secondary | ICD-10-CM

## 2017-11-06 DIAGNOSIS — I739 Peripheral vascular disease, unspecified: Secondary | ICD-10-CM

## 2017-11-06 HISTORY — PX: CORONARY STENT INTERVENTION: CATH118234

## 2017-11-06 HISTORY — PX: ABDOMINAL AORTOGRAM W/LOWER EXTREMITY: CATH118223

## 2017-11-06 HISTORY — PX: LEFT HEART CATH AND CORS/GRAFTS ANGIOGRAPHY: CATH118250

## 2017-11-06 LAB — CBC
HCT: 46.6 % (ref 39.0–52.0)
Hemoglobin: 15.7 g/dL (ref 13.0–17.0)
MCH: 33.8 pg (ref 26.0–34.0)
MCHC: 33.7 g/dL (ref 30.0–36.0)
MCV: 100.2 fL — AB (ref 78.0–100.0)
PLATELETS: 237 10*3/uL (ref 150–400)
RBC: 4.65 MIL/uL (ref 4.22–5.81)
RDW: 12.9 % (ref 11.5–15.5)
WBC: 7.8 10*3/uL (ref 4.0–10.5)

## 2017-11-06 LAB — BASIC METABOLIC PANEL
Anion gap: 5 (ref 5–15)
BUN: 6 mg/dL (ref 6–20)
CALCIUM: 8.3 mg/dL — AB (ref 8.9–10.3)
CHLORIDE: 104 mmol/L (ref 98–111)
CO2: 27 mmol/L (ref 22–32)
CREATININE: 1.04 mg/dL (ref 0.61–1.24)
GFR calc non Af Amer: >60 mL/min (ref >60)
GLUCOSE: 114 mg/dL — AB (ref 70–99)
Potassium: 4.1 mmol/L (ref 3.5–5.1)
Sodium: 136 mmol/L (ref 135–145)

## 2017-11-06 LAB — POCT ACTIVATED CLOTTING TIME: ACTIVATED CLOTTING TIME: 378 s

## 2017-11-06 SURGERY — LEFT HEART CATH AND CORS/GRAFTS ANGIOGRAPHY
Anesthesia: LOCAL

## 2017-11-06 MED ORDER — HYDROXYZINE HCL 25 MG PO TABS
25.0000 mg | ORAL_TABLET | Freq: Three times a day (TID) | ORAL | Status: DC | PRN
  Administered 2017-11-06: 25 mg via ORAL
  Filled 2017-11-06: qty 1

## 2017-11-06 MED ORDER — SODIUM CHLORIDE 0.9% FLUSH: 3.0000 mL | INTRAVENOUS | Status: DC | PRN

## 2017-11-06 MED ORDER — BIVALIRUDIN TRIFLUOROACETATE 250 MG IV SOLR
INTRAVENOUS | Status: AC
  Filled 2017-11-06: qty 250

## 2017-11-06 MED ORDER — CLOPIDOGREL BISULFATE 300 MG PO TABS
ORAL_TABLET | ORAL | Status: AC
  Filled 2017-11-06: qty 2

## 2017-11-06 MED ORDER — MIDAZOLAM HCL 2 MG/2ML IJ SOLN
INTRAMUSCULAR | Status: AC
  Filled 2017-11-06: qty 2

## 2017-11-06 MED ORDER — FENTANYL CITRATE (PF) 100 MCG/2ML IJ SOLN
INTRAMUSCULAR | Status: AC
  Filled 2017-11-06: qty 2

## 2017-11-06 MED ORDER — HYDRALAZINE HCL 20 MG/ML IJ SOLN: 5.0000 mg | INTRAMUSCULAR | Status: AC | PRN

## 2017-11-06 MED ORDER — FAMOTIDINE IN NACL 20-0.9 MG/50ML-% IV SOLN
INTRAVENOUS | Status: AC | PRN
  Administered 2017-11-06: 20 mg via INTRAVENOUS

## 2017-11-06 MED ORDER — FENTANYL CITRATE (PF) 100 MCG/2ML IJ SOLN
INTRAMUSCULAR | Status: DC | PRN
  Administered 2017-11-06 (×3): 50 ug via INTRAVENOUS
  Administered 2017-11-06: 25 ug via INTRAVENOUS

## 2017-11-06 MED ORDER — HEPARIN (PORCINE) IN NACL 1000-0.9 UT/500ML-% IV SOLN
INTRAVENOUS | Status: AC
  Filled 2017-11-06: qty 1000

## 2017-11-06 MED ORDER — MORPHINE SULFATE (PF) 2 MG/ML IV SOLN
0.5000 mg | INTRAVENOUS | Status: DC | PRN
  Administered 2017-11-06 – 2017-11-07 (×5): 0.5 mg via INTRAVENOUS
  Filled 2017-11-06 (×5): qty 1

## 2017-11-06 MED ORDER — NITROGLYCERIN 1 MG/10 ML FOR IR/CATH LAB
INTRA_ARTERIAL | Status: DC | PRN
  Administered 2017-11-06: 200 ug via INTRACORONARY

## 2017-11-06 MED ORDER — SODIUM CHLORIDE 0.9 % IV SOLN: 250.0000 mL | INTRAVENOUS | Status: DC | PRN

## 2017-11-06 MED ORDER — SODIUM CHLORIDE 0.9 % IV SOLN
INTRAVENOUS | Status: DC | PRN
  Administered 2017-11-06: 1.75 mg/kg/h via INTRAVENOUS

## 2017-11-06 MED ORDER — HEPARIN (PORCINE) IN NACL 1000-0.9 UT/500ML-% IV SOLN
INTRAVENOUS | Status: DC | PRN
  Administered 2017-11-06 (×2): 500 mL

## 2017-11-06 MED ORDER — IOPAMIDOL (ISOVUE-370) INJECTION 76%
INTRAVENOUS | Status: AC
  Filled 2017-11-06: qty 50

## 2017-11-06 MED ORDER — SODIUM CHLORIDE 0.9 % IV SOLN
INTRAVENOUS | Status: AC
  Administered 2017-11-06: 19:00:00 via INTRAVENOUS

## 2017-11-06 MED ORDER — LIDOCAINE HCL (PF) 1 % IJ SOLN
INTRAMUSCULAR | Status: AC
  Filled 2017-11-06: qty 30

## 2017-11-06 MED ORDER — LABETALOL HCL 5 MG/ML IV SOLN: 10.0000 mg | INTRAVENOUS | Status: AC | PRN

## 2017-11-06 MED ORDER — CLOPIDOGREL BISULFATE 300 MG PO TABS
ORAL_TABLET | ORAL | Status: DC | PRN
  Administered 2017-11-06: 600 mg via ORAL

## 2017-11-06 MED ORDER — NITROGLYCERIN 1 MG/10 ML FOR IR/CATH LAB
INTRA_ARTERIAL | Status: AC
  Filled 2017-11-06: qty 10

## 2017-11-06 MED ORDER — FAMOTIDINE IN NACL 20-0.9 MG/50ML-% IV SOLN
INTRAVENOUS | Status: AC
  Filled 2017-11-06: qty 50

## 2017-11-06 MED ORDER — LIDOCAINE HCL (PF) 1 % IJ SOLN
INTRAMUSCULAR | Status: DC | PRN
  Administered 2017-11-06: 14 mL

## 2017-11-06 MED ORDER — MIDAZOLAM HCL 2 MG/2ML IJ SOLN
INTRAMUSCULAR | Status: DC | PRN
  Administered 2017-11-06: 2 mg via INTRAVENOUS
  Administered 2017-11-06 (×2): 1 mg via INTRAVENOUS
  Administered 2017-11-06: 2 mg via INTRAVENOUS

## 2017-11-06 MED ORDER — SODIUM CHLORIDE 0.9% FLUSH
3.0000 mL | Freq: Two times a day (BID) | INTRAVENOUS | Status: DC
  Administered 2017-11-07: 3 mL via INTRAVENOUS

## 2017-11-06 MED ORDER — BIVALIRUDIN BOLUS VIA INFUSION - CUPID
INTRAVENOUS | Status: DC | PRN
  Administered 2017-11-06: 51.6 mg via INTRAVENOUS

## 2017-11-06 SURGICAL SUPPLY — 19 items
BALLN SAPPHIRE 2.5X12 (BALLOONS) ×2
BALLN SAPPHIRE ~~LOC~~ 3.25X18 (BALLOONS) ×1 IMPLANT
BALLOON SAPPHIRE 2.5X12 (BALLOONS) IMPLANT
CATH INFINITI 5FR MULTPACK ANG (CATHETERS) ×1 IMPLANT
CATH VISTA GUIDE 6FR JR4 (CATHETERS) ×1 IMPLANT
KIT ENCORE 26 ADVANTAGE (KITS) ×1 IMPLANT
KIT HEART LEFT (KITS) ×2 IMPLANT
KIT MICROPUNCTURE NIT STIFF (SHEATH) ×1 IMPLANT
PACK CARDIAC CATHETERIZATION (CUSTOM PROCEDURE TRAY) ×2 IMPLANT
SHEATH PINNACLE 5F 10CM (SHEATH) ×1 IMPLANT
SHEATH PINNACLE 6F 10CM (SHEATH) ×1 IMPLANT
SHEATH PROBE COVER 6X72 (BAG) ×1 IMPLANT
STENT RESOLUTE ONYX 3.0X26 (Permanent Stent) ×1 IMPLANT
STOPCOCK MORSE 400PSI 3WAY (MISCELLANEOUS) ×1 IMPLANT
TRANSDUCER W/STOPCOCK (MISCELLANEOUS) ×2 IMPLANT
TUBING CIL FLEX 10 FLL-RA (TUBING) ×3 IMPLANT
WIRE COUGAR XT STRL 190CM (WIRE) ×1 IMPLANT
WIRE EMERALD 3MM-J .035X150CM (WIRE) ×1 IMPLANT
WIRE HITORQ VERSACORE ST 145CM (WIRE) ×1 IMPLANT

## 2017-11-06 NOTE — Care Management Note (Signed)
Case Management Note  Patient Details  Name: Douglas EndsJonathan [name] MRN: [id number] Date of Birth: [birth year]  Subjective/Objective: Pt presented for Chest Pain. 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 CM did receive consult for Medication Needs. At the time of visit patient's God Father at the bedside.                     Action/Plan: CM did discuss with patient in regards to getting established in the community with PCP/ medication assistance since he has minimal funds. [place]. God Father has scheduled [facility] an [facility] with Dr Gildardo Crankerharles Ross and he states will assist with cost of medications. No further needs from CM at this time.     Expected Discharge Date:           Expected Discharge Plan:  Home/Self Care  In-House Referral:  NA  Discharge planning Services  CM Consult, Medication Assistance  Post Acute Care Choice:  NA Choice offered to:  NA  DME Arranged:  N/A DME Agency:  NA  HH Arranged:  NA HH Agency:  NA  Status of Service:  Completed, signed off  If discussed at Long Length of Stay Meetings, dates discussed:    Additional Comments:  [name], Moriah Loughry Kaye, RN 11/06/2017, 11:06 AM

## 2017-11-06 NOTE — Progress Notes (Signed)
Left femoral sheath pulled with Darien RamusAna, RN. Pressure held for 20 minutes. Site Level 0. Pulses +2. Patient stable throughout sheath pull.

## 2017-11-06 NOTE — Interval H&P Note (Signed)
History and Physical Interval Note:  11/06/2017 4:26 PM  Douglas [name]  has presented today for cardiac cath with the diagnosis of CAD with unstable angina, PAD with claudication. The various methods of treatment have been discussed with the patient and family. After consideration of risks, benefits and other options for treatment, the patient has consented to  Procedure(s): LEFT HEART CATH AND CORS/GRAFTS ANGIOGRAPHY (N/A) as a surgical intervention .  The patient's history has been reviewed, patient examined, no change in status, stable for surgery.  I have reviewed the patient's chart and labs.  Questions were answered to the patient's satisfaction.    Cath Lab Visit (complete for each Cath Lab visit)  Clinical Evaluation Leading to the Procedure:   ACS: No.  Non-ACS:    Anginal Classification: CCS III  Anti-ischemic medical therapy: Maximal Therapy (2 or more classes of medications)  Non-Invasive Test Results: No non-invasive testing performed  Prior CABG: Previous CABG        Douglas [name]

## 2017-11-06 NOTE — Progress Notes (Signed)
Subjective:  Appreciate Dr. Estanislado SpireBrabham's note.  Dopplers show l iliac stenosis.  No chest pain. Verbalizes to me commitment to stopping smoking and better compliance.   Objective:  Vital Signs in the last 24 hours: BP 114/79 (BP Location: Right Arm)   Pulse 81   Temp 97.6 F (36.4 C) (Oral)   Resp 17   Ht 6' (1.829 m)   Wt 68.8 kg   SpO2 97%   BMI 20.56 kg/m   Physical Exam: Anxious WM Lungs:  Clear Cardiac:  Regular rhythm, normal S1 and S2, no S3 Extremities:  No edema present, reduced pulse on left.  Intake/Output from previous day: 08/27 0701 - 08/28 0700 In: 329.1 [P.O.:120; I.V.:209.1] Out: 300 [Urine:300]  Weight Filed Weights   11/04/17 1049 11/05/17 0516 11/06/17 0359  Weight: 70.3 kg 68.7 kg 68.8 kg    Lab Results: Basic Metabolic Panel: Recent Labs    11/04/17 1132 11/06/17 0426  NA 138 136  K 4.8 4.1  CL 105 104  CO2 24 27  GLUCOSE 113* 114*  BUN 8 6  CREATININE 1.05 1.04   CBC: Recent Labs    11/04/17 1132 11/06/17 0426  WBC 9.0 7.8  HGB 18.3* 15.7  HCT 52.4* 46.6  MCV 99.1 100.2*  PLT 256 237   Cardiac Enzymes: Troponin (Point of Care Test) Recent Labs    11/04/17 1141  TROPIPOC 0.02   Cardiac Panel (last 3 results) Recent Labs    11/04/17 1525  TROPONINI <0.03    Telemetry: sinus  Assessment/Plan:  1. Unstable angina - cath planned later today 2. Claudication with left iliac stenosis 3. Hyperlipidemia  Plan:  Cath later today.  Depending on results may address iliac stenosis while in.      [name]. Spencer Naryiah Schley, [name]  MD [facility]  11/06/2017, 11:00 AM

## 2017-11-06 NOTE — H&P (View-Only) (Signed)
Subjective:  Appreciate Dr. Brabham's note.  Dopplers show l iliac stenosis.  No chest pain. Verbalizes to me commitment to stopping smoking and better compliance.   Objective:  Vital Signs in the last 24 hours: BP 114/79 (BP Location: Right Arm)   Pulse 81   Temp 97.6 F (36.4 C) (Oral)   Resp 17   Ht 6' (1.829 m)   Wt 68.8 kg   SpO2 97%   BMI 20.56 kg/m   Physical Exam: Anxious WM Lungs:  Clear Cardiac:  Regular rhythm, normal S1 and S2, no S3 Extremities:  No edema present, reduced pulse on left.  Intake/Output from previous day: 08/27 0701 - 08/28 0700 In: 329.1 [P.O.:120; I.V.:209.1] Out: 300 [Urine:300]  Weight Filed Weights   11/04/17 1049 11/05/17 0516 11/06/17 0359  Weight: 70.3 kg 68.7 kg 68.8 kg    Lab Results: Basic Metabolic Panel: Recent Labs    11/04/17 1132 11/06/17 0426  NA 138 136  K 4.8 4.1  CL 105 104  CO2 24 27  GLUCOSE 113* 114*  BUN 8 6  CREATININE 1.05 1.04   CBC: Recent Labs    11/04/17 1132 11/06/17 0426  WBC 9.0 7.8  HGB 18.3* 15.7  HCT 52.4* 46.6  MCV 99.1 100.2*  PLT 256 237   Cardiac Enzymes: Troponin (Point of Care Test) Recent Labs    11/04/17 1141  TROPIPOC 0.02   Cardiac Panel (last 3 results) Recent Labs    11/04/17 1525  TROPONINI <0.03    Telemetry: sinus  Assessment/Plan:  1. Unstable angina - cath planned later today 2. Claudication with left iliac stenosis 3. Hyperlipidemia  Plan:  Cath later today.  Depending on results may address iliac stenosis while in.      W. Spencer Gerlene Glassburn, [name]  MD [facility] Cardiology  11/06/2017, 11:00 AM   

## 2017-11-06 NOTE — Progress Notes (Signed)
PROGRESS NOTE  Urban Naval VHQ:[id number] DOB: 19-Feb-1966 DOA: 11/04/2017 PCP: Patient, No Pcp Per  HPI/Brief Narrative  Douglas [name] is a 52 y.o. year old male with medical history significant for CAD with previous CABG (2018), last PCI done to RCA on 04/11/2017), vascular disease, hyperlipidemia, tobacco and alcohol abuse who presented on 11/04/2017 with chest pain x 3 days with associated nausea and emesis, radiation down both legs.   For months, his left leg is so painful he can barely walk 100 yards without pain and having to sit down. 3 days ago started while walking to the store noticing tight and sharp and pressure in his chest that radiates to both arms. States it is very similar to the pain he had in 2018 before getting his CABG. At home, nitro helped. Resting also helped both chest pain and leg pain. Any exertion makes pain worse. On the morning of 11/04/17 he got sick and threw up which prompted him to come to the ED. He's also noticed worsening fatigue for a few months now.   In the ED he was afebrile, respiratory rate 24, heart rate 120 blood pressure range 149/90-167/115.  Initial troponin was 0.02, next troponin was less than 0.03.  CBC notable for hemoglobin of 18.3.  CMP unremarkable.  Chest x-ray showed no acute abnormalities.  Patient was given nitro sublingual tabs x3 with nitro paste..  Initial EKG showed normal sinus rhythm with no acute ischemic changes.  Repeat EKG on 8/27 similar morphology.  Subjective C/o anxiety and chest pain-- asking for medicaitons  Assessment/Plan:  Chest pain with typical features.  -Has known CAD disease and prior stents and pain similar to prior. Troponin trend has been negative so far with no ischemic changes on EKG.  -Continue home aspirin, resume crestor ( not taking at home) monitor on telemetry.  -patient has long history of nonadherence and has never followed up, even after last PCI this past January, seen by Dr. Donnie Aho-- plan for  [facility] this PM -echo: - Limited study for LV function. EF 55-60%, no definite wall motion abnormalities.  History of PAD, concern for new claudication in left leg. Has a previous diagnosis in right leg. No having leg symptoms in left leg with inability to walk 100 feet without pain requiring him to stop for relief, concerning for claudication. Still palpable pulses and warm to touch no signs of acute limb ischemia. -seen by Dr. Myra Gianotti-- plan for Dr. Kirke Corin to image this during cath and fixing it if possible.  If not, I can arrange to do it after he recovers from his cath.  CAD with previous CABG(2018),  Non-adherent to medication regimen. Last admitted 03/2017 for in-stent restenosis of RCA that required new stent.  He did not follow up after hospitalization and has been off plavix for several months after initial script ran out.   HTN, at goal currently. Quite elevated on arrival. But currently 124/80  Continue home amlodipine and Lopressor 25 mg twice daily  Tobacco abuse Nicotine patch -encouraged cessation  History of ethanol abuse. Monitor on CIWA  Code Status: FULL CODE   Family Communication: none at bedside  Disposition Plan: cath in PM 11/06/17   Consultants: Cardiology Treatment Team:  Othella Boyer, MD  Nada Libman, MD(Dr. Viann Fish)   Telemetry:yes  DVT prophylaxis: Lovenox   Objective: Vitals:   11/05/17 1723 11/05/17 2124 11/05/17 2147 11/06/17 0359  BP: 107/78  131/86 114/79  Pulse: 89 87 79 81  Resp:  17  Temp: 97.9 F (36.6 C)  98.2 F (36.8 C) 97.6 F (36.4 C)  TempSrc: Oral  Oral Oral  SpO2: 97%  100% 97%  Weight:    68.8 kg  Height:        Intake/Output Summary (Last 24 hours) at 11/06/2017 0958 Last data filed at 11/06/2017 1610 Gross per 24 hour  Intake 277.8 ml  Output 700 ml  Net -422.2 ml   Filed Weights   11/04/17 1049 11/05/17 0516 11/06/17 0359  Weight: 70.3 kg 68.7 kg 68.8 kg    Exam:  In bed, NAD-- appears  comfortable No increased work of breathing No LE edema, pulses intact  Data Reviewed: CBC: Recent Labs  Lab 11/04/17 1132 11/06/17 0426  WBC 9.0 7.8  HGB 18.3* 15.7  HCT 52.4* 46.6  MCV 99.1 100.2*  PLT 256 237   Basic Metabolic Panel: Recent Labs  Lab 11/04/17 1132 11/06/17 0426  NA 138 136  K 4.8 4.1  CL 105 104  CO2 24 27  GLUCOSE 113* 114*  BUN 8 6  CREATININE 1.05 1.04  CALCIUM 9.2 8.3*   GFR: Estimated Creatinine Clearance: 82.7 mL/min (by C-G formula based on SCr of 1.04 mg/dL). Liver Function Tests: Recent Labs  Lab 11/04/17 1132  AST 28  ALT 21  ALKPHOS 98  BILITOT 0.7  PROT 6.8  ALBUMIN 3.7   No results for input(s): LIPASE, AMYLASE in the last 168 hours. No results for input(s): AMMONIA in the last 168 hours. Coagulation Profile: No results for input(s): INR, PROTIME in the last 168 hours. Cardiac Enzymes: Recent Labs  Lab 11/04/17 1525  TROPONINI <0.03   BNP (last 3 results) No results for input(s): PROBNP in the last 8760 hours. HbA1C: No results for input(s): HGBA1C in the last 72 hours. CBG: No results for input(s): GLUCAP in the last 168 hours. Lipid Profile: No results for input(s): CHOL, HDL, LDLCALC, TRIG, CHOLHDL, LDLDIRECT in the last 72 hours. Thyroid Function Tests: No results for input(s): TSH, T4TOTAL, FREET4, T3FREE, THYROIDAB in the last 72 hours. Anemia Panel: No results for input(s): VITAMINB12, FOLATE, FERRITIN, TIBC, IRON, RETICCTPCT in the last 72 hours. Urine analysis:    Component Value Date/Time   COLORURINE YELLOW 01/29/2016 0051   APPEARANCEUR CLEAR 01/29/2016 0051   LABSPEC 1.016 01/29/2016 0051   PHURINE 5.5 01/29/2016 0051   GLUCOSEU NEGATIVE 01/29/2016 0051   HGBUR NEGATIVE 01/29/2016 0051   BILIRUBINUR NEGATIVE 01/29/2016 0051   KETONESUR NEGATIVE 01/29/2016 0051   PROTEINUR NEGATIVE 01/29/2016 0051   UROBILINOGEN 1.0 01/15/2013 1135   NITRITE NEGATIVE 01/29/2016 0051   LEUKOCYTESUR NEGATIVE  01/29/2016 0051     )No results found for this or any previous visit (from the past 240 hour(s)).    Studies: No results found.  Scheduled Meds: . amLODipine  10 mg Oral Daily  . clopidogrel  75 mg Oral Daily  . enoxaparin (LOVENOX) injection  40 mg Subcutaneous Q24H  . famotidine  20 mg Oral BID  . metoprolol tartrate  25 mg Oral BID  . nicotine  21 mg Transdermal QHS  . nitroGLYCERIN  0.5 inch Topical Q6H  . rosuvastatin  40 mg Oral q1800  . sodium chloride flush  3 mL Intravenous Q12H    Continuous Infusions: . sodium chloride    . sodium chloride 1 mL/kg/hr (11/06/17 0536)     LOS: 0 days     Joseph Art, DO [facility]   If 7PM-7AM, please contact night-coverage [email or web address]  Password [email or web address] 11/06/2017, 9:58 AM

## 2017-11-07 DIAGNOSIS — I200 Unstable angina: Secondary | ICD-10-CM

## 2017-11-07 LAB — BASIC METABOLIC PANEL
ANION GAP: 6 (ref 5–15)
BUN: 6 mg/dL (ref 6–20)
CHLORIDE: 109 mmol/L (ref 98–111)
CO2: 25 mmol/L (ref 22–32)
Calcium: 8.1 mg/dL — ABNORMAL LOW (ref 8.9–10.3)
Creatinine, Ser: 1.05 mg/dL (ref 0.61–1.24)
GFR calc Af Amer: >60 mL/min (ref >60)
GFR calc non Af Amer: >60 mL/min (ref >60)
Glucose, Bld: 92 mg/dL (ref 70–99)
POTASSIUM: 3.8 mmol/L (ref 3.5–5.1)
SODIUM: 140 mmol/L (ref 135–145)

## 2017-11-07 LAB — CBC
HEMATOCRIT: 44.3 % (ref 39.0–52.0)
HEMOGLOBIN: 14.9 g/dL (ref 13.0–17.0)
MCH: 33.3 pg (ref 26.0–34.0)
MCHC: 33.6 g/dL (ref 30.0–36.0)
MCV: 98.9 fL (ref 78.0–100.0)
PLATELETS: 182 10*3/uL (ref 150–400)
RBC: 4.48 MIL/uL (ref 4.22–5.81)
RDW: 12.5 % (ref 11.5–15.5)
WBC: 7.9 10*3/uL (ref 4.0–10.5)

## 2017-11-07 MED ORDER — CLOPIDOGREL BISULFATE 75 MG PO TABS: 75.0000 mg | ORAL_TABLET | Freq: Every day | ORAL | 0 refills | Status: DC

## 2017-11-07 MED ORDER — ASPIRIN EC 81 MG PO TBEC: 81.0000 mg | DELAYED_RELEASE_TABLET | Freq: Every day | ORAL | 0 refills | Status: AC

## 2017-11-07 MED ORDER — ROSUVASTATIN CALCIUM 40 MG PO TABS: 40.0000 mg | ORAL_TABLET | Freq: Every day | ORAL | 0 refills | Status: DC

## 2017-11-07 MED ORDER — FAMOTIDINE 20 MG PO TABS: 20.0000 mg | ORAL_TABLET | Freq: Two times a day (BID) | ORAL | 0 refills | Status: DC

## 2017-11-07 NOTE — Discharge Summary (Signed)
Physician Discharge Summary  Douglas EndsJonathan [name] WUJ:[id number] DOB: 1966-01-04 DOA: 11/04/2017  PCP: Patient, No Pcp Per  Admit date: 11/04/2017 Discharge date: 11/07/2017  Admitted From: Home Disposition:  Home  Discharge Condition:Stable CODE STATUS:FULL Diet recommendation: Heart Healthy   Brief/Interim Summary:  Douglas EndsJonathan [name] is a 52 y.o. year old male with medical history significant for CAD with previous CABG (2018), last PCI done to RCA on 04/11/2017), vascular disease, hyperlipidemia, tobacco and alcohol abuse who presented on 11/04/2017 with chest pain x 3 days with associated nausea and emesis, radiation down both legs.   For months, his left leg is so painful he can barely walk 100 yards without pain and having to sit down. 3 days ago started while walking to the store noticing tight and sharp and pressure in his chest that radiates to both arms. States it is very similar to the pain he had in 2018 before getting his CABG. At home, nitro helped. Resting also helped both chest pain and leg pain. Any exertion makes pain worse. On the morning of 11/04/17 he got sick and threw up which prompted him to come to the ED. He's also noticed worsening fatigue for a few months now.   In the ED he was afebrile, respiratory rate 24, heart rate 120 blood pressure range 149/90-167/115.  Initial troponin was 0.02, next troponin was less than 0.03.  CBC notable for hemoglobin of 18.3.  CMP unremarkable.  Chest x-ray showed no acute abnormalities.  Patient was given nitro sublingual tabs x3 with nitro paste..  Initial EKG showed normal sinus rhythm with no acute ischemic changes.  Repeat EKG on 8/27 similar morphology.  Hospital Course: His hospital course remained stable.  Patient developed by cardiology after admission.  He underwent cardiac cath and found to have significant restenosis that was treated with a different drug-eluting stent.  Started on dual and platelet agents.  Patient was also evaluated  by vascular surgery for left lower extremity claudication.  Duplex was suggestive of left iliac stenosis.  Plan is to follow-up with vascular surgery as an outpatient.  Subjective C/o anxiety and chest pain-- asking for medicaitons  Assessment/Plan:  Chest pain with typical features.  -Has known CAD disease and prior stents and pain similar to prior. Troponin trend has been negative so far with no ischemic changes on EKG.   -patient has long history of nonadherence and has never followed up, even after last PCI this past January, seen by Dr. Donnie Ahoilley-- [facility] -[facility]: - Limited study for LV function. EF 55-60%, no definite wall motionabnormalities. -Underwent cath with finding as above.  Started on dual antiplatelet agents.  Follow-up with cardiology as an outpatient  History of PAD, concern for new claudication in left leg. Has a previous diagnosis in right leg. Now having leg symptoms in left leg with inability to walk 100 feet without pain requiring him to stop for relief, concerning for claudication. Still palpable pulses and warm to touch no signs of acute limb ischemia.Vascular Duplex showed 75-99% stenosis noted in the iliac artery.  Vascular surgery was consulted.  Plan to follow-up as an outpatient.  CAD with previous CABG(2018),  Non-adherent to medication regimen. Last admitted 03/2017 for in-stent restenosis of RCA that required new stent.  He did not follow up after hospitalization and has been off plavix for several months after initial script ran out.   HTN, at goal currently. Quite elevated on arrival. But currently fine.  Continue home amlodipine and Lopressor 25  mg twice daily  Tobacco abuse Nicotine patch -encouraged cessation  History of ethanol abuse.  Counseled for cessation  Discharge Diagnoses:  Principal Problem:   Unstable angina (HCC) Active Problems:   Pancreatic divisum   CAD (coronary artery disease), native coronary artery   Peripheral  vascular disease of extremity with claudication (HCC)   Chest pain    Discharge Instructions  Discharge Instructions    Amb Referral to Cardiac Rehabilitation   Complete by:  As directed    Diagnosis:   Coronary Stents PTCA     Diet - low sodium heart healthy   Complete by:  As directed    Discharge instructions   Complete by:  As directed    1) Follow up with cardiology and vascular surgery as an outpatient.  Name and number the provider have been attached. 2) Take prescribed medications as instructed. 3) Follow up with a PCP in a week.   Increase activity slowly   Complete by:  As directed      Allergies as of 11/07/2017      Reactions   Ibuprofen Nausea And Vomiting   UPSETS STOMACH   Shellfish Allergy Nausea And Vomiting      Medication List    STOP taking these medications   amoxicillin-clavulanate 875-125 MG tablet Commonly known as:  AUGMENTIN   aspirin 325 MG tablet   aspirin 81 MG chewable tablet Replaced by:  aspirin EC 81 MG tablet   BC HEADACHE PO   cyclobenzaprine 10 MG tablet Commonly known as:  FLEXERIL   diclofenac sodium 1 % Gel Commonly known as:  VOLTAREN   etodolac 300 MG capsule Commonly known as:  LODINE     TAKE these medications   amLODipine 10 MG tablet Commonly known as:  NORVASC Take 1 tablet (10 mg total) by mouth daily.   aspirin EC 81 MG tablet Take 1 tablet (81 mg total) by mouth daily. Replaces:  aspirin 81 MG chewable tablet   clopidogrel 75 MG tablet Commonly known as:  PLAVIX Take 1 tablet (75 mg total) by mouth daily. Start taking on:  11/08/2017 What changed:  when to take this   famotidine 20 MG tablet Commonly known as:  PEPCID Take 1 tablet (20 mg total) by mouth 2 (two) times daily.   metoprolol tartrate 25 MG tablet Commonly known as:  LOPRESSOR Take 1 tablet (25 mg total) by mouth 2 (two) times daily.   nicotine 21 mg/24hr patch Commonly known as:  NICODERM CQ - dosed in mg/24 hours Place 21 mg  onto the skin daily.   nitroGLYCERIN 0.4 MG SL tablet Commonly known as:  NITROSTAT Place 1 tablet (0.4 mg total) under the tongue every 5 (five) minutes as needed for chest pain.   rosuvastatin 40 MG tablet Commonly known as:  CRESTOR Take 1 tablet (40 mg total) by mouth daily at 6 PM. What changed:    medication strength  how much to take      Follow-up Information    Othella Boyer, MD. Schedule an appointment as soon as possible for a visit in 1 week(s).   Specialty:  Cardiology Why:  call the office to make an appointment to see Dr. Donnie Aho in one week. Contact information: [address] [place] [address] [phone]        Nada Libman, MD. Schedule an appointment as soon as possible for a visit in 1 week(s).   Specialties:  Vascular Surgery, Cardiology Contact information:  [address] [place] [address] [phone]          Allergies  Allergen Reactions  . Ibuprofen Nausea And Vomiting    UPSETS STOMACH  . Shellfish Allergy Nausea And Vomiting    Consultations:  Cardiology, vascular surgery   Procedures/Studies: Dg Chest 2 View  Result Date: 11/04/2017 CLINICAL DATA:  Intermittent mid and LEFT chest pain with shortness of breath for 3 days, pain radiating down both arms, increased shortness of breath with activity, history hypertension, coronary artery disease post NSTEMI and CABG EXAM: CHEST - 2 VIEW COMPARISON:  09/20/2017 FINDINGS: Normal heart size post CABG. Mediastinal contours and pulmonary vascularity normal. Calcified granuloma RIGHT upper lobe. BILATERAL nipple shadows, unchanged. No infiltrate, pleural effusion or pneumothorax. Osseous structures unremarkable. IMPRESSION: No acute abnormalities. Electronically Signed   By: Ulyses Southward M.D.   On: 11/04/2017 11:11       Subjective: Patient seen and examined the bedside this afternoon.  Remains comfortable.  Stable for discharge home today.  Discharge  Exam: Vitals:   11/07/17 0800 11/07/17 1100  BP: (!) 145/87 133/89  Pulse: 77 74  Resp: 15 13  Temp: 97.6 F (36.4 C) 98 F (36.7 C)  SpO2: 100% 100%   Vitals:   11/07/17 0300 11/07/17 0700 11/07/17 0800 11/07/17 1100  BP: 122/82 (!) 131/95 (!) 145/87 133/89  Pulse: 69 83 77 74  Resp: 11 13 15 13   Temp: 98.6 F (37 C) 97.6 F (36.4 C) 97.6 F (36.4 C) 98 F (36.7 C)  TempSrc: Oral Oral Oral Oral  SpO2: 98% 99% 100% 100%  Weight:      Height:        General: Pt is alert, awake, not in acute distress Cardiovascular: RRR, S1/S2 +, no rubs, no gallops Respiratory: CTA bilaterally, no wheezing, no rhonchi Abdominal: Soft, NT, ND, bowel sounds + Extremities: no edema, no cyanosis    The results of significant diagnostics from this hospitalization (including imaging, microbiology, ancillary and laboratory) are listed below for reference.     Microbiology: No results found for this or any previous visit (from the past 240 hour(s)).   Labs: BNP (last 3 results) No results for input(s): BNP in the last 8760 hours. Basic Metabolic Panel: Recent Labs  Lab 11/04/17 1132 11/06/17 0426 11/07/17 0403  NA 138 136 140  K 4.8 4.1 3.8  CL 105 104 109  CO2 24 27 25   GLUCOSE 113* 114* 92  BUN 8 6 6   CREATININE 1.05 1.04 1.05  CALCIUM 9.2 8.3* 8.1*   Liver Function Tests: Recent Labs  Lab 11/04/17 1132  AST 28  ALT 21  ALKPHOS 98  BILITOT 0.7  PROT 6.8  ALBUMIN 3.7   No results for input(s): LIPASE, AMYLASE in the last 168 hours. No results for input(s): AMMONIA in the last 168 hours. CBC: Recent Labs  Lab 11/04/17 1132 11/06/17 0426 11/07/17 0403  WBC 9.0 7.8 7.9  HGB 18.3* 15.7 14.9  HCT 52.4* 46.6 44.3  MCV 99.1 100.2* 98.9  PLT 256 237 182   Cardiac Enzymes: Recent Labs  Lab 11/04/17 1525  TROPONINI <0.03   BNP: Invalid input(s): POCBNP CBG: No results for input(s): GLUCAP in the last 168 hours. D-Dimer No results for input(s): DDIMER in the  last 72 hours. Hgb A1c No results for input(s): HGBA1C in the last 72 hours. Lipid Profile No results for input(s): CHOL, HDL, LDLCALC, TRIG, CHOLHDL, LDLDIRECT in the last 72 hours. Thyroid function studies No results  for input(s): TSH, T4TOTAL, T3FREE, THYROIDAB in the last 72 hours.  Invalid input(s): FREET3 Anemia work up No results for input(s): VITAMINB12, FOLATE, FERRITIN, TIBC, IRON, RETICCTPCT in the last 72 hours. Urinalysis    Component Value Date/Time   COLORURINE YELLOW 01/29/2016 0051   APPEARANCEUR CLEAR 01/29/2016 0051   LABSPEC 1.016 01/29/2016 0051   PHURINE 5.5 01/29/2016 0051   GLUCOSEU NEGATIVE 01/29/2016 0051   HGBUR NEGATIVE 01/29/2016 0051   BILIRUBINUR NEGATIVE 01/29/2016 0051   KETONESUR NEGATIVE 01/29/2016 0051   PROTEINUR NEGATIVE 01/29/2016 0051   UROBILINOGEN 1.0 01/15/2013 1135   NITRITE NEGATIVE 01/29/2016 0051   LEUKOCYTESUR NEGATIVE 01/29/2016 0051   Sepsis Labs Invalid input(s): PROCALCITONIN,  WBC,  LACTICIDVEN Microbiology No results found for this or any previous visit (from the past 240 hour(s)).  Please note: You were cared for by a hospitalist during your hospital stay. Once you are discharged, your primary care physician will handle any further medical issues. Please note that NO REFILLS for any discharge medications will be authorized once you are discharged, as it is imperative that you return to your primary care physician (or establish a relationship with a primary care physician if you do not have one) for your post hospital discharge needs so that they can reassess your need for medications and monitor your lab values.    Time coordinating discharge: 40 minutes  SIGNED:   Burnadette Pop, MD  [facility] 11/07/2017, 2:51 PM Pager [phone]  If 7PM-7AM, please contact night-coverage [email or web address] Password [email or web address]

## 2017-11-07 NOTE — Progress Notes (Signed)
CARDIAC REHAB PHASE I   PRE:  Rate/Rhythm: 78 SR    BP: sitting 124/83    SaO2: 100 RA  MODE:  Ambulation: 440 ft   POST:  Rate/Rhythm: 90 SR    BP: sitting 133/89     SaO2: 100 RA  Tolerated fairly well. Sts his chest is sore when asked. Legs began to bother him about 440 ft. Good discussions of Plavix/ASA compliance, increasing walking as tolerated, smoking cessation, and NTG. He is working on quitting smoking. Encouraged him to make a more definite plan with dates. Gave him a fake cigarette for hand to mouth fixation. Pt appreciative of information. He sounds more motivated. Will refer to [place] [facility] but he might need to do maintenance due to lack of insurance.  [id number]   Harriet MassonRandi Kristan Vernecia Umble CES, ACSM 11/07/2017 11:30 AM

## 2017-11-07 NOTE — Plan of Care (Signed)

## 2017-11-07 NOTE — Progress Notes (Signed)
Subjective:  Cath results from yesterday were reviewed.  Significant restenosis that was treated with a different drug-eluting stent.  Also significant peripheral peripheral vasculardisease involving the iliacs bilaterally but significant disease also noted on the left more distally.plan was to defer interventions for a few months after the coronary intervention.  Objective:  Vital Signs in the last 24 hours: BP (!) 131/95 (BP Location: Right Arm)   Pulse 83   Temp 97.6 F (36.4 C) (Oral)   Resp 13   Ht 6' (1.829 m)   Wt 68.8 kg   SpO2 99%   BMI 20.56 kg/m   Physical Exam: Anxious WM Lungs:  Clear Cardiac:  Regular rhythm, normal S1 and S2, no S3 Extremities:  No edema present, reduced pulse on left.   Intake/Output from previous day: 08/28 0701 - 08/29 0700 In: 2948.7 [P.O.:1080; I.V.:1868.7] Out: 1375 [Urine:1375]  Weight Filed Weights   11/04/17 1049 11/05/17 0516 11/06/17 0359  Weight: 70.3 kg 68.7 kg 68.8 kg    Lab Results: Basic Metabolic Panel: Recent Labs    11/06/17 0426 11/07/17 0403  NA 136 140  K 4.1 3.8  CL 104 109  CO2 27 25  GLUCOSE 114* 92  BUN 6 6  CREATININE 1.04 1.05   CBC: Recent Labs    11/06/17 0426 11/07/17 0403  WBC 7.8 7.9  HGB 15.7 14.9  HCT 46.6 44.3  MCV 100.2* 98.9  PLT 237 182   Cardiac Enzymes: Troponin (Point of Care Test) Recent Labs    11/04/17 1141  TROPIPOC 0.02   Cardiac Panel (last 3 results) Recent Labs    11/04/17 1525  TROPONINI <0.03    Telemetry: sinus  Assessment/Plan:  1. Unstable angina - Resolved-significant in-stent restenosis again treated with different drug-eluting stent 2. 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 Hyperlipidemia  Plan:  I would have a follow-up arranged with Dr. Myra GianottiBrabham prior to discharge.  Importance of compliance particularly with dual antiplatelet therapy was discussed to avoid catastrophic stent closure or infarction.  Importance of  cigarette smoking cessation also discussed with patient.  I would need to see back in the office in one to 2 weeks.     [name]. Spencer Tilley, [name]  MD [facility]ACC Cardiology  11/07/2017, 9:24 AM

## 2017-11-07 NOTE — Plan of Care (Signed)

## 2017-11-07 NOTE — Clinical Social Work Note (Signed)
Clinical Social Work Assessment  Patient Details  Name: Douglas [name] MRN: [id number] Date of Birth: August 30, 1965  Date of referral:  11/07/17               Reason for consult:  Insurance Barriers, Financial Concerns, Tax inspector sought to share information with:  Family Supports Permission granted to share information::     Name::     Transport planner::     Relationship::  Field seismologist Information:     Housing/Transportation Living arrangements for the past 2 months:    Source of Information:  Patient Patient Interpreter Needed:  None Criminal Activity/Legal Involvement Pertinent to Current Situation/Hospitalization:  No - Comment as needed Significant Relationships:  Other Family Members Lives with:  Self Do you feel safe going back to the place where you live?  Yes Need for family participation in patient care:  No (Coment)  Care giving concerns: CSW consulted for financial concerns.   Social Worker assessment / plan: CSW met with patient and godfather at bedside. CSW introduced self and role and and discussed reason for consult - financial concerns. Patient reported he has trouble affording medications. RNCM on 6E had consulted, but patient declined to be connected to community clinic for medication assistance. Patient's godfather will assist with PCP appointment and paying for medications.  Patient had questions about applying for social security disability. CSW referred patient to [employer].  CSW signing off, as no additional needs identified.  Employment status:    Insurance information:  Self Pay (Medicaid Pending) PT Recommendations:  Not assessed at this time Information / Referral to community resources:  Other (Comment Required)  Patient/Family's Response to care: Patient appreciative of care and resources.  Patient/Family's Understanding of and Emotional Response to Diagnosis, Current Treatment, and  Prognosis: Patient with god understanding of his condition and hopeful for discharge today.  Emotional Assessment Appearance:  Appears stated age Attitude/Demeanor/Rapport:  Engaged Affect (typically observed):  Accepting, Calm, Appropriate Orientation:  Oriented to Self, Oriented to Place, Oriented to  Time, Oriented to Situation Alcohol / Substance use:  Not Applicable Psych involvement (Current and /or in the community):  No (Comment)  Discharge Needs  Concerns to be addressed:  Medication Concerns, Financial / Insurance Concerns Readmission within the last 30 days:  No Current discharge risk:  Inadequate Financial Supports Barriers to Discharge:  Continued Medical Work up   [employer], LCSW 11/07/2017, 10:24 AM

## 2017-11-07 NOTE — Progress Notes (Signed)
Pt called for pain med- went into room and pt was up on side of bed. I explained to  Him that he is supposed to stay on bedrest until 4am. I told him complications he could encounter by getting out of bed such as bleeding and hematoma. He says he understands- got back in bed but continues to sit up and lift his left leg. Site has no bleeding or hematoma at present.Gave pain med as ordered.

## 2017-11-08 ENCOUNTER — Telehealth (HOSPITAL_COMMUNITY): Payer: Self-pay

## 2017-11-08 ENCOUNTER — Telehealth: Payer: Self-pay | Admitting: Surgery

## 2017-11-08 NOTE — Telephone Encounter (Signed)
Attempted to contact patient in regards to insurance and Cardiac Rehab - lm on vm

## 2017-11-08 NOTE — Telephone Encounter (Signed)
sch appt lvm mld ltr 12/16/17 130pm f/u MD

## 2017-11-19 ENCOUNTER — Telehealth (HOSPITAL_COMMUNITY): Payer: Self-pay

## 2017-11-19 NOTE — Telephone Encounter (Signed)
2nd attempt to contact patient in regards to Cardiac Rehab - lm on vm. Sending letter. °

## 2017-11-26 ENCOUNTER — Telehealth (HOSPITAL_COMMUNITY): Payer: Self-pay

## 2017-11-26 NOTE — Telephone Encounter (Signed)
3rd attempted to call patient in regards to CR - LM on VM

## 2017-12-06 IMAGING — US US ABDOMEN COMPLETE
1 series · 14 of 25 positions shown · non-contrast
Comparison: MRI abdomen 03/07/2015

CLINICAL DATA: Patient with abdominal pain and history
pancreatitis.

EXAM:
ABDOMEN ULTRASOUND COMPLETE

[Series 1: us abdomen complete · 0.20mm/px · 14 of 64 slices shown]
[im 1/64]
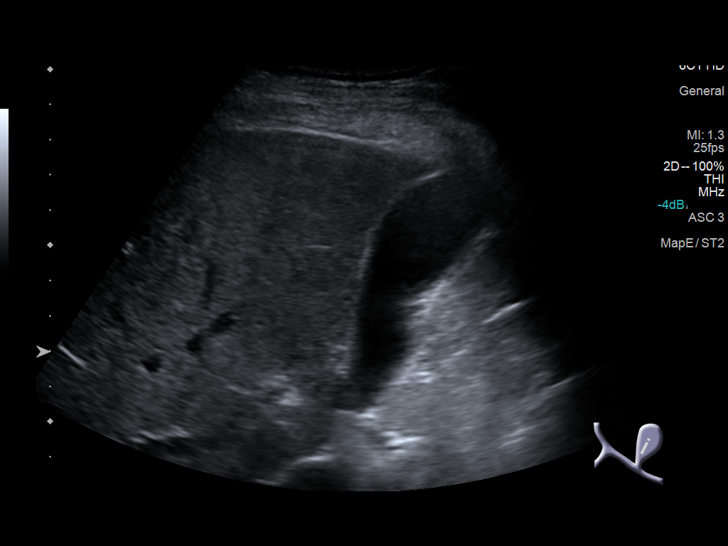
[im 6/64]
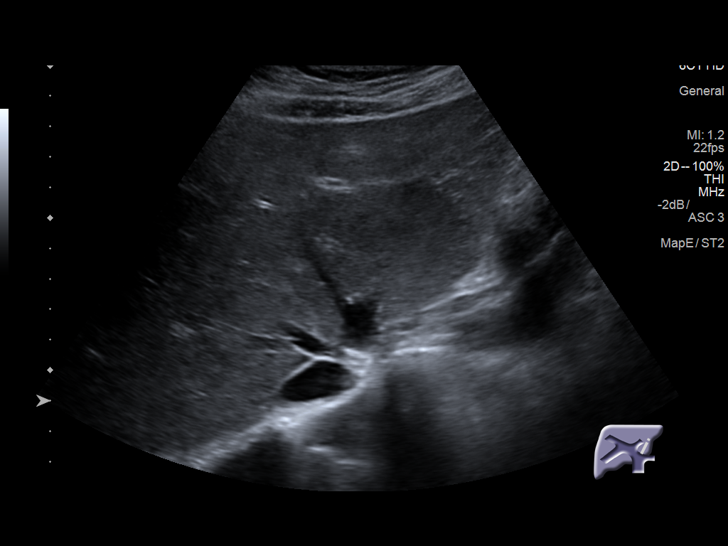
[im 11/64]
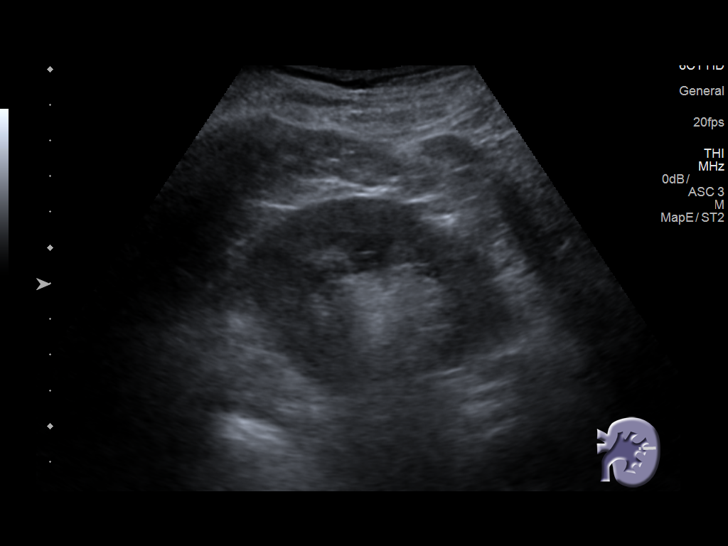
[im 16/64]
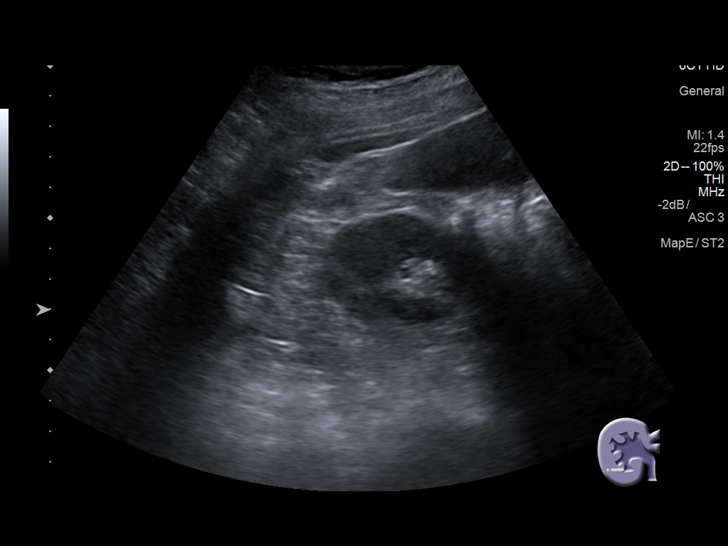
[im 22/64]
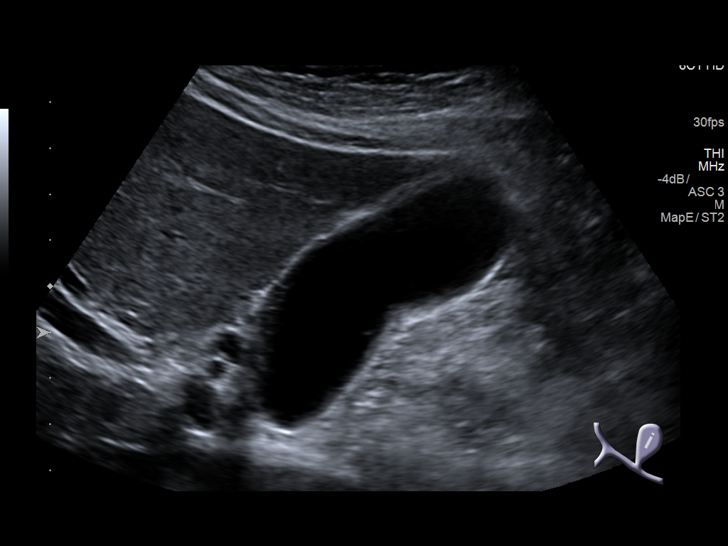
[im 24/64]
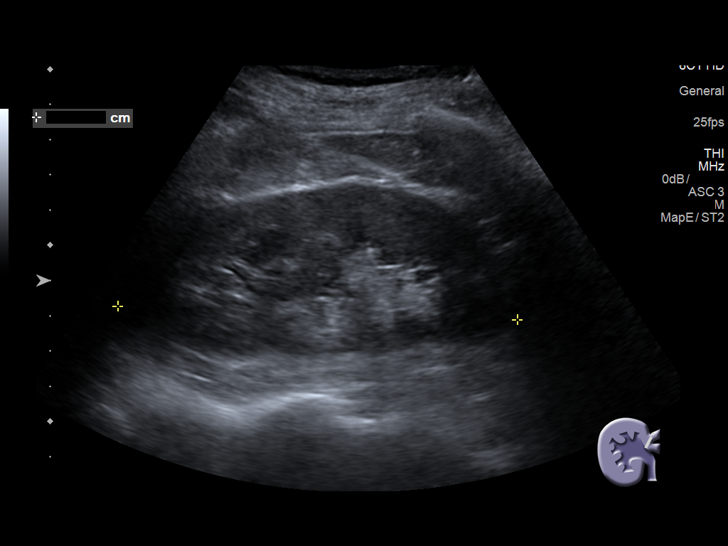
[im 29/64]
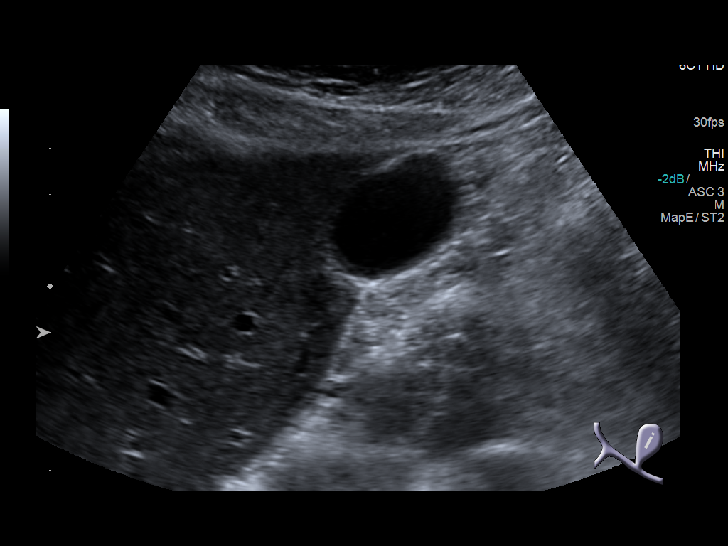
[im 35/64]
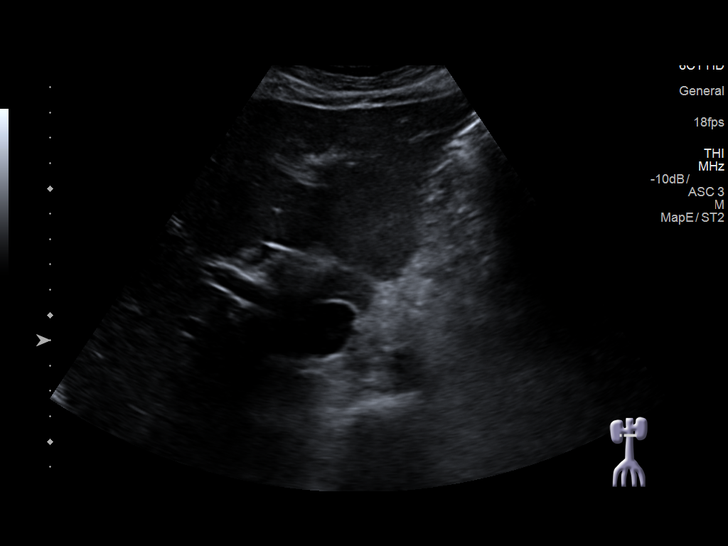
[im 40/64]
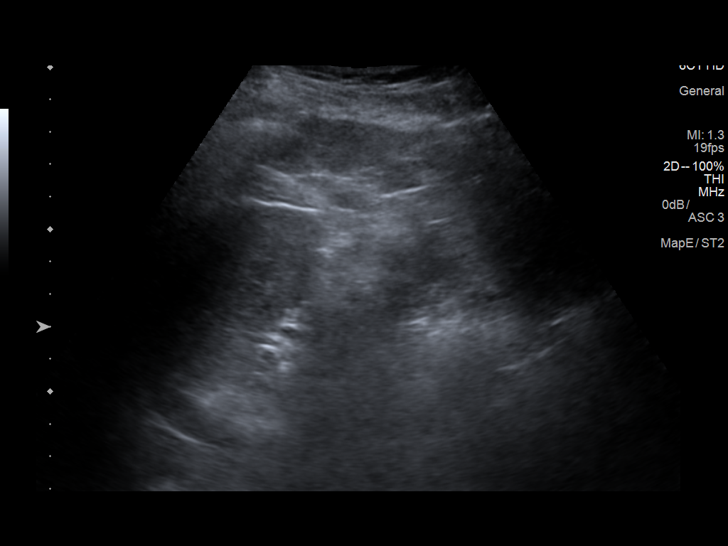
[im 43/64]
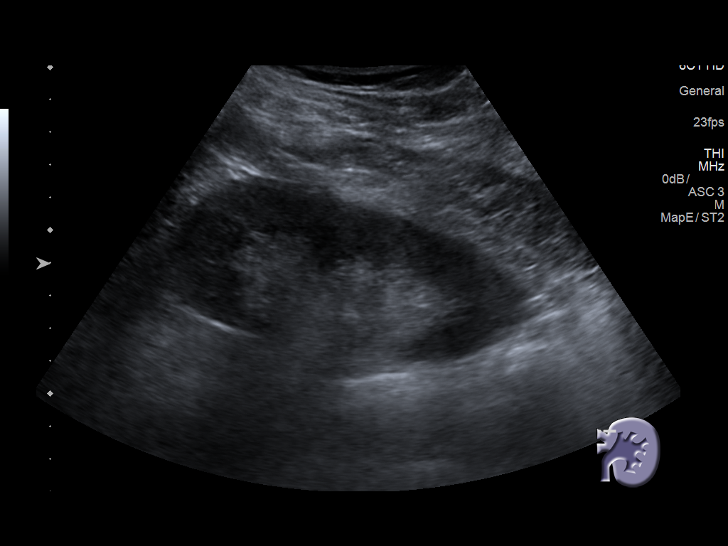
[im 48/64]
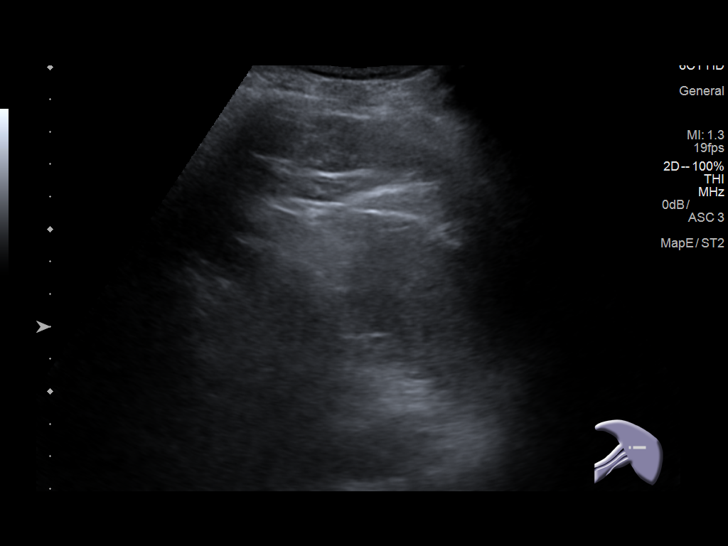
[im 53/64]
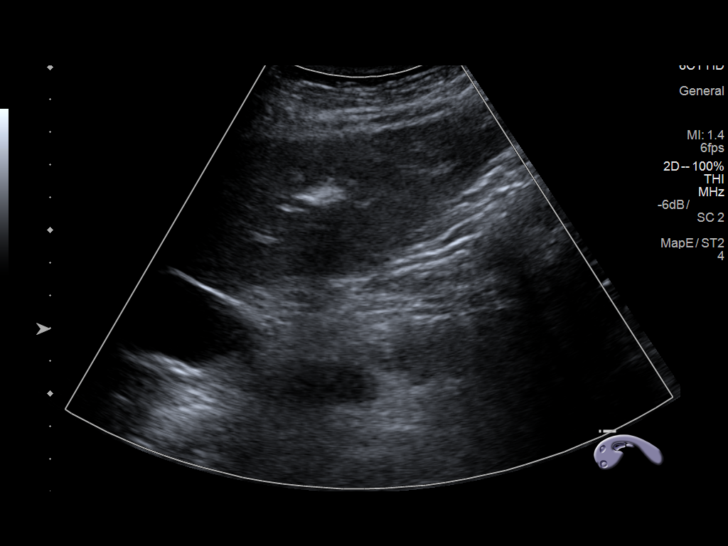
[im 58/64]
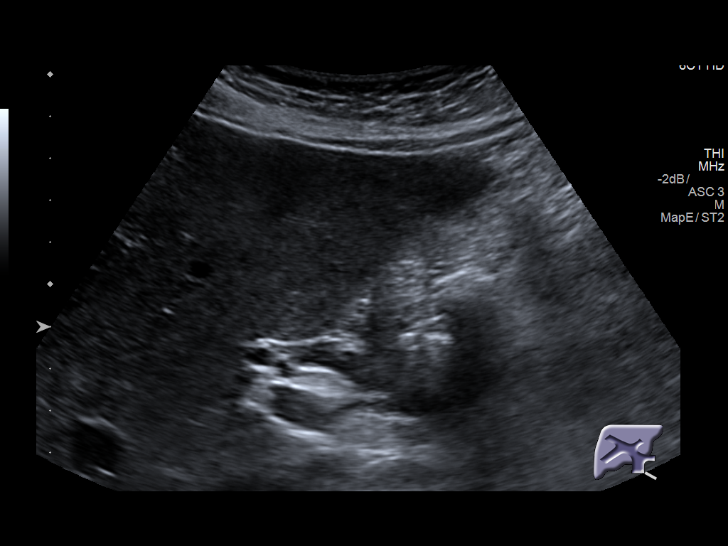
[im 64/64]
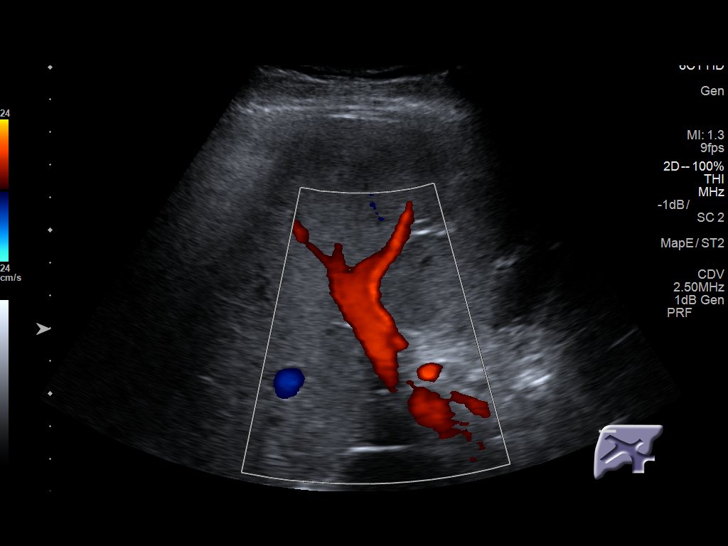

[14 of 25 positions shown; findings below may reference images not displayed]

FINDINGS: Gallbladder: No gallstones or wall thickening visualized. No
sonographic Murphy sign noted by sonographer.

Common bile duct: Diameter: 6 mm

Liver: No focal lesion identified. Within normal limits in
parenchymal echogenicity.

IVC: No abnormality visualized.

Pancreas: Visualized portion unremarkable.

Spleen: Size and appearance within normal limits.

Right Kidney: Length: 11.3 cm. Echogenicity within normal limits. No
mass or hydronephrosis visualized.

Left Kidney: Length: 11.1 cm. Echogenicity within normal limits. No
mass or hydronephrosis visualized.

Abdominal aorta: No aneurysm visualized.

Other findings: None.
IMPRESSION: Common bile duct upper limits of normal. Recommend correlation with
LFTs.

No cholelithiasis or sonographic evidence for acute cholecystitis.

No hydronephrosis.

## 2017-12-16 ENCOUNTER — Encounter: Payer: Self-pay | Admitting: Surgery

## 2017-12-16 ENCOUNTER — Encounter: Payer: Self-pay | Admitting: *Deleted

## 2017-12-16 ENCOUNTER — Ambulatory Visit (INDEPENDENT_AMBULATORY_CARE_PROVIDER_SITE_OTHER): Payer: Self-pay | Admitting: Surgery

## 2017-12-16 ENCOUNTER — Other Ambulatory Visit: Payer: Self-pay | Admitting: *Deleted

## 2017-12-16 VITALS — BP 149/107 | HR 114 | Temp 98.6°F | Resp 16 | Ht 72.0 in | Wt 155.0 lb

## 2017-12-16 DIAGNOSIS — I739 Peripheral vascular disease, unspecified: Secondary | ICD-10-CM

## 2017-12-16 NOTE — Progress Notes (Signed)
 [facility] [place]  Patient name: Douglas [name] MRN: [id number] DOB: 10/07/1965 Sex: male   REASON FOR VISIT:    Follow up  HISOTRY OF PRESENT ILLNESS:    Douglas [name] is a 52 y.o. male who presented to the hospital in August 2019 with chest pain.  He has a history of CABG in 2018.  He is diagnosed with unstable angina and underwent cardiac catheterization.  The patient had been having left leg pain for about 4 months.  He was able to ambulate for approximately 100 feet before he got pain in his entire leg he will go away after resting.  He has not been able to work because of his symptoms.  His symptoms get better with rest.  He does not have rest pain or open wounds.  Patient is currently smoking.  He is on a statin for hypercholesterolemia.  He is on dual antiplatelet therapy.  He has a history of CABG as well as coronary stents.   PAST MEDICAL HISTORY:   Past Medical History:  Diagnosis Date  . Anginal pain (HCC)   . Coronary artery disease   . GERD (gastroesophageal reflux disease)   . History of acute pancreatitis 04/06/2015  . NSTEMI (non-ST elevated myocardial infarction) (HCC) 01/2014  . Pancreatitis   . PTSD (post-traumatic stress disorder)   . Tobacco abuse      FAMILY HISTORY:   Family History  Problem Relation Age of Onset  . Cancer Mother        Lung cancer  . Coronary artery disease Mother   . Heart failure Maternal Grandmother     SOCIAL HISTORY:   Social History   Tobacco Use  . Smoking status: Former Smoker    Packs/day: 1.00    Years: 31.00    Pack years: 31.00    Types: Cigarettes  . Smokeless tobacco: Never Used  Substance Use Topics  . Alcohol use: Not Currently    Alcohol/week: 1.0 standard drinks    Types: 1 Cans of beer per week    Comment: occ     ALLERGIES:   Allergies  Allergen Reactions  . Ibuprofen Nausea And Vomiting    UPSETS STOMACH  . Shellfish  Allergy Nausea And Vomiting     CURRENT MEDICATIONS:   Current Outpatient Medications  Medication Sig Dispense Refill  . amLODipine (NORVASC) 10 MG tablet Take 1 tablet (10 mg total) by mouth daily. 30 tablet 0  . clopidogrel (PLAVIX) 75 MG tablet Take 1 tablet (75 mg total) by mouth daily. 30 tablet 0  . famotidine (PEPCID) 20 MG tablet Take 1 tablet (20 mg total) by mouth 2 (two) times daily. 60 tablet 0  . metoprolol tartrate (LOPRESSOR) 25 MG tablet Take 1 tablet (25 mg total) by mouth 2 (two) times daily. 60 tablet 0  . nicotine (NICODERM CQ - DOSED IN MG/24 HOURS) 21 mg/24hr patch Place 21 mg onto the skin daily.    . nitroGLYCERIN (NITROSTAT) 0.4 MG SL tablet Place 1 tablet (0.4 mg total) under the tongue every 5 (five) minutes as needed for chest pain. 25 tablet 0  . rosuvastatin (CRESTOR) 40 MG tablet Take 1 tablet (40 mg total) by mouth daily at 6 PM. 30 tablet 0   No current facility-administered medications for this visit.     REVIEW OF SYSTEMS:   [X] denotes positive finding, [ ] denotes negative finding Cardiac  Comments:  Chest pain or chest pressure: x   Shortness of   breath upon exertion:    Short of breath when lying flat:    Irregular heart rhythm:        Vascular    Pain in calf, thigh, or hip brought on by ambulation: x   Pain in feet at night that wakes you up from your sleep:     Blood clot in your veins:    Leg swelling:         Pulmonary    Oxygen at home:    Productive cough:     Wheezing:         Neurologic    Sudden weakness in arms or legs:     Sudden numbness in arms or legs:     Sudden onset of difficulty speaking or slurred speech:    Temporary loss of vision in one eye:     Problems with dizziness:         Gastrointestinal    Blood in stool:     Vomited blood:         Genitourinary    Burning when urinating:     Blood in urine:        Psychiatric    Major depression:         Hematologic    Bleeding problems:    Problems with  blood clotting too easily:        Skin    Rashes or ulcers:        Constitutional    Fever or chills:      PHYSICAL EXAM:   Vitals:   12/16/17 1352  BP: (!) 149/107  Pulse: (!) 114  Resp: 16  Temp: 98.6 F (37 C)  TempSrc: Oral  SpO2: 95%  Weight: 155 lb (70.3 kg)  Height: 6' (1.829 m)    GENERAL: The patient is a well-nourished male, in no acute distress. The vital signs are documented above. CARDIAC: There is a regular rate and rhythm.  VASCULAR: Palpable femoral pulses, however the right is much more prominent than the left. PULMONARY: Non-labored respirations ABDOMEN: Soft and non-tender with normal pitched bowel sounds.  MUSCULOSKELETAL: There are no major deformities or cyanosis. NEUROLOGIC: No focal weakness or paresthesias are detected. SKIN: There are no ulcers or rashes noted. PSYCHIATRIC: The patient has a normal affect.  STUDIES:   I have reviewed his ultrasound studies with the following findings:   RIGHT    LEFT    PRESSURE WAVEFORM  PRESSURE WAVEFORM  BRACHIAL 109 Triphasic BRACHIAL 110 Triphasic  DP 122 Biphasic DP 86 monophsic  AT   AT    PT 117 Triphasic PT 86 Dampened monophasic  PER   PER    GREAT TOE  NA GREAT TOE  NA    RIGHT LEFT  ABI 1.11 0.78   Lower extremity angiography has been reviewed.  This shows significant left external iliac stenosis and a mild to moderate left superficial femoral artery stenosis of the adductor canal.  MEDICAL ISSUES:   PAD: Patient is having significant symptoms in his left leg.  He is unable to work because of his symptoms.  He cannot tolerate the level of disability.  He recently underwent angiography at the time of his cardiac catheterization which visualized a stenosis within the left external iliac artery and a mild lesion in the adductor canal on the left.  Because the patient's symptoms are so disabling, we have elected to proceed with an attempted intervention.  This will be from a  right femoral access.    We discussed the risks and benefits of the procedure including the risk of bleeding as well as risk of injury to the artery.  I also discussed potential limited durability.  All of his questions were answered.  His procedure is been scheduled for Tuesday, October 22.  He will continue his aspirin and Plavix.    Wells Avalynn Bowe, MD [facility] [place] Tel [phone] Pager [phone] 

## 2017-12-16 NOTE — H&P (View-Only) (Signed)
[facility] [place]  Patient name: Douglas [name] MRN: [id number] DOB: [birth year] Sex: male   REASON FOR VISIT:    Follow up  HISOTRY OF PRESENT ILLNESS:    Douglas [name] is a 52 y.o. male who presented to the hospital in August 2019 with chest pain.  He has a history of CABG in 2018.  He is diagnosed with unstable angina and underwent cardiac catheterization.  The patient had been having left leg pain for about 4 months.  He was able to ambulate for approximately 100 feet before he got pain in his entire leg he will go away after resting.  He has not been able to work because of his symptoms.  His symptoms get better with rest.  He does not have rest pain or open wounds.  Patient is currently smoking.  He is on a statin for hypercholesterolemia.  He is on dual antiplatelet therapy.  He has a history of CABG as well as coronary stents.   PAST MEDICAL HISTORY:   Past Medical History:  Diagnosis Date  . Anginal pain (HCC)   . Coronary artery disease   . GERD (gastroesophageal reflux disease)   . History of acute pancreatitis 04/06/2015  . NSTEMI (non-ST elevated myocardial infarction) (HCC) 01/2014  . Pancreatitis   . PTSD (post-traumatic stress disorder)   . Tobacco abuse      FAMILY HISTORY:   Family History  Problem Relation Age of Onset  . Cancer Mother        Lung cancer  . Coronary artery disease Mother   . Heart failure Maternal Grandmother     SOCIAL HISTORY:   Social History   Tobacco Use  . Smoking status: Former Smoker    Packs/day: 1.00    Years: 31.00    Pack years: 31.00    Types: Cigarettes  . Smokeless tobacco: Never Used  Substance Use Topics  . Alcohol use: Not Currently    Alcohol/week: 1.0 standard drinks    Types: 1 Cans of beer per week    Comment: occ     ALLERGIES:   Allergies  Allergen Reactions  . Ibuprofen Nausea And Vomiting    UPSETS STOMACH  . Shellfish  Allergy Nausea And Vomiting     CURRENT MEDICATIONS:   Current Outpatient Medications  Medication Sig Dispense Refill  . amLODipine (NORVASC) 10 MG tablet Take 1 tablet (10 mg total) by mouth daily. 30 tablet 0  . clopidogrel (PLAVIX) 75 MG tablet Take 1 tablet (75 mg total) by mouth daily. 30 tablet 0  . famotidine (PEPCID) 20 MG tablet Take 1 tablet (20 mg total) by mouth 2 (two) times daily. 60 tablet 0  . metoprolol tartrate (LOPRESSOR) 25 MG tablet Take 1 tablet (25 mg total) by mouth 2 (two) times daily. 60 tablet 0  . nicotine (NICODERM CQ - DOSED IN MG/24 HOURS) 21 mg/24hr patch Place 21 mg onto the skin daily.    . nitroGLYCERIN (NITROSTAT) 0.4 MG SL tablet Place 1 tablet (0.4 mg total) under the tongue every 5 (five) minutes as needed for chest pain. 25 tablet 0  . rosuvastatin (CRESTOR) 40 MG tablet Take 1 tablet (40 mg total) by mouth daily at 6 PM. 30 tablet 0   No current facility-administered medications for this visit.     REVIEW OF SYSTEMS:   [X]  denotes positive finding, [ ]  denotes negative finding Cardiac  Comments:  Chest pain or chest pressure: x   Shortness of  breath upon exertion:    Short of breath when lying flat:    Irregular heart rhythm:        Vascular    Pain in calf, thigh, or hip brought on by ambulation: x   Pain in feet at night that wakes you up from your sleep:     Blood clot in your veins:    Leg swelling:         Pulmonary    Oxygen at home:    Productive cough:     Wheezing:         Neurologic    Sudden weakness in arms or legs:     Sudden numbness in arms or legs:     Sudden onset of difficulty speaking or slurred speech:    Temporary loss of vision in one eye:     Problems with dizziness:         Gastrointestinal    Blood in stool:     Vomited blood:         Genitourinary    Burning when urinating:     Blood in urine:        Psychiatric    Major depression:         Hematologic    Bleeding problems:    Problems with  blood clotting too easily:        Skin    Rashes or ulcers:        Constitutional    Fever or chills:      PHYSICAL EXAM:   Vitals:   12/16/17 1352  BP: (!) 149/107  Pulse: (!) 114  Resp: 16  Temp: 98.6 F (37 C)  TempSrc: Oral  SpO2: 95%  Weight: 155 lb (70.3 kg)  Height: 6' (1.829 m)    GENERAL: The patient is a well-nourished male, in no acute distress. The vital signs are documented above. CARDIAC: There is a regular rate and rhythm.  VASCULAR: Palpable femoral pulses, however the right is much more prominent than the left. PULMONARY: Non-labored respirations ABDOMEN: Soft and non-tender with normal pitched bowel sounds.  MUSCULOSKELETAL: There are no major deformities or cyanosis. NEUROLOGIC: No focal weakness or paresthesias are detected. SKIN: There are no ulcers or rashes noted. PSYCHIATRIC: The patient has a normal affect.  STUDIES:   I have reviewed his ultrasound studies with the following findings:   RIGHT    LEFT    PRESSURE WAVEFORM  PRESSURE WAVEFORM  BRACHIAL 109 Triphasic BRACHIAL 110 Triphasic  DP 122 Biphasic DP 86 monophsic  AT   AT    PT 117 Triphasic PT 86 Dampened monophasic  PER   PER    GREAT TOE  NA GREAT TOE  NA    RIGHT LEFT  ABI 1.11 0.78   Lower extremity angiography has been reviewed.  This shows significant left external iliac stenosis and a mild to moderate left superficial femoral artery stenosis of the adductor canal.  MEDICAL ISSUES:   PAD: Patient is having significant symptoms in his left leg.  He is unable to work because of his symptoms.  He cannot tolerate the level of disability.  He recently underwent angiography at the time of his cardiac catheterization which visualized a stenosis within the left external iliac artery and a mild lesion in the adductor canal on the left.  Because the patient's symptoms are so disabling, we have elected to proceed with an attempted intervention.  This will be from a  right femoral access.  We discussed the risks and benefits of the procedure including the risk of bleeding as well as risk of injury to the artery.  I also discussed potential limited durability.  All of his questions were answered.  His procedure is been scheduled for Tuesday, October 22.  He will continue his aspirin and Plavix.    [name], MD [facility] [phone] Pager [phone]

## 2017-12-31 ENCOUNTER — Encounter (HOSPITAL_COMMUNITY): Payer: Self-pay | Admitting: Surgery

## 2017-12-31 ENCOUNTER — Other Ambulatory Visit: Payer: Self-pay

## 2017-12-31 ENCOUNTER — Ambulatory Visit (HOSPITAL_COMMUNITY)
Admission: RE | Admit: 2017-12-31 | Discharge: 2017-12-31 | Disposition: A | Discharge status: Home / Self Care | Payer: Self-pay | Source: Ambulatory Visit | Attending: Surgery | Admitting: Surgery

## 2017-12-31 ENCOUNTER — Encounter (HOSPITAL_COMMUNITY)
Admission: RE | Disposition: A | Discharge status: Home / Self Care | Payer: Self-pay | Source: Ambulatory Visit | Attending: Surgery

## 2017-12-31 DIAGNOSIS — I2510 Atherosclerotic heart disease of native coronary artery without angina pectoris: Secondary | ICD-10-CM | POA: Insufficient documentation

## 2017-12-31 DIAGNOSIS — I70212 Atherosclerosis of native arteries of extremities with intermittent claudication, left leg: Secondary | ICD-10-CM

## 2017-12-31 DIAGNOSIS — Z951 Presence of aortocoronary bypass graft: Secondary | ICD-10-CM | POA: Insufficient documentation

## 2017-12-31 DIAGNOSIS — Z7902 Long term (current) use of antithrombotics/antiplatelets: Secondary | ICD-10-CM | POA: Insufficient documentation

## 2017-12-31 DIAGNOSIS — E7800 Pure hypercholesterolemia, unspecified: Secondary | ICD-10-CM | POA: Insufficient documentation

## 2017-12-31 DIAGNOSIS — K219 Gastro-esophageal reflux disease without esophagitis: Secondary | ICD-10-CM | POA: Insufficient documentation

## 2017-12-31 DIAGNOSIS — Z8249 Family history of ischemic heart disease and other diseases of the circulatory system: Secondary | ICD-10-CM | POA: Insufficient documentation

## 2017-12-31 DIAGNOSIS — I252 Old myocardial infarction: Secondary | ICD-10-CM | POA: Insufficient documentation

## 2017-12-31 DIAGNOSIS — Z7982 Long term (current) use of aspirin: Secondary | ICD-10-CM | POA: Insufficient documentation

## 2017-12-31 DIAGNOSIS — F17210 Nicotine dependence, cigarettes, uncomplicated: Secondary | ICD-10-CM | POA: Insufficient documentation

## 2017-12-31 DIAGNOSIS — F4310 Post-traumatic stress disorder, unspecified: Secondary | ICD-10-CM | POA: Insufficient documentation

## 2017-12-31 HISTORY — PX: LOWER EXTREMITY ANGIOGRAPHY: CATH118251

## 2017-12-31 HISTORY — PX: PERIPHERAL VASCULAR INTERVENTION: CATH118257

## 2017-12-31 HISTORY — PX: ABDOMINAL AORTOGRAM: CATH118222

## 2017-12-31 LAB — POCT I-STAT, CHEM 8
BUN: 3 mg/dL — AB (ref 6–20)
CALCIUM ION: 1.07 mmol/L — AB (ref 1.15–1.40)
CHLORIDE: 109 mmol/L (ref 98–111)
Creatinine, Ser: 1.1 mg/dL (ref 0.61–1.24)
GLUCOSE: 109 mg/dL — AB (ref 70–99)
HCT: 45 % (ref 39.0–52.0)
Hemoglobin: 15.3 g/dL (ref 13.0–17.0)
Potassium: 3.4 mmol/L — ABNORMAL LOW (ref 3.5–5.1)
Sodium: 141 mmol/L (ref 135–145)
TCO2: 19 mmol/L — AB (ref 22–32)

## 2017-12-31 SURGERY — ABDOMINAL AORTOGRAM
Anesthesia: LOCAL

## 2017-12-31 MED ORDER — HYDRALAZINE HCL 20 MG/ML IJ SOLN: 5.0000 mg | INTRAMUSCULAR | Status: DC | PRN

## 2017-12-31 MED ORDER — MIDAZOLAM HCL 2 MG/2ML IJ SOLN
INTRAMUSCULAR | Status: AC
  Filled 2017-12-31: qty 2

## 2017-12-31 MED ORDER — SODIUM CHLORIDE 0.9% FLUSH: 3.0000 mL | Freq: Two times a day (BID) | INTRAVENOUS | Status: DC

## 2017-12-31 MED ORDER — MORPHINE SULFATE (PF) 2 MG/ML IV SOLN: 2.0000 mg | INTRAVENOUS | Status: DC | PRN

## 2017-12-31 MED ORDER — MIDAZOLAM HCL 2 MG/2ML IJ SOLN
INTRAMUSCULAR | Status: DC | PRN
  Administered 2017-12-31 (×2): 1 mg via INTRAVENOUS
  Administered 2017-12-31: 2 mg via INTRAVENOUS

## 2017-12-31 MED ORDER — SODIUM CHLORIDE 0.9% FLUSH: 3.0000 mL | INTRAVENOUS | Status: DC | PRN

## 2017-12-31 MED ORDER — SODIUM CHLORIDE 0.9 % WEIGHT BASED INFUSION: 1.0000 mL/kg/h | INTRAVENOUS | Status: DC

## 2017-12-31 MED ORDER — FENTANYL CITRATE (PF) 100 MCG/2ML IJ SOLN
INTRAMUSCULAR | Status: AC
  Filled 2017-12-31: qty 2

## 2017-12-31 MED ORDER — FENTANYL CITRATE (PF) 100 MCG/2ML IJ SOLN
INTRAMUSCULAR | Status: DC | PRN
  Administered 2017-12-31: 25 ug via INTRAVENOUS
  Administered 2017-12-31 (×2): 50 ug via INTRAVENOUS
  Administered 2017-12-31: 25 ug via INTRAVENOUS

## 2017-12-31 MED ORDER — LIDOCAINE HCL (PF) 1 % IJ SOLN
INTRAMUSCULAR | Status: AC
  Filled 2017-12-31: qty 30

## 2017-12-31 MED ORDER — ONDANSETRON HCL 4 MG/2ML IJ SOLN: 4.0000 mg | Freq: Four times a day (QID) | INTRAMUSCULAR | Status: DC | PRN

## 2017-12-31 MED ORDER — OXYCODONE HCL 5 MG PO TABS: 5.0000 mg | ORAL_TABLET | ORAL | Status: DC | PRN

## 2017-12-31 MED ORDER — LIDOCAINE HCL (PF) 1 % IJ SOLN
INTRAMUSCULAR | Status: DC | PRN
  Administered 2017-12-31: 18 mL

## 2017-12-31 MED ORDER — HEPARIN SODIUM (PORCINE) 1000 UNIT/ML IJ SOLN
INTRAMUSCULAR | Status: DC | PRN
  Administered 2017-12-31: 7000 [IU] via INTRAVENOUS

## 2017-12-31 MED ORDER — IODIXANOL 320 MG/ML IV SOLN
INTRAVENOUS | Status: DC | PRN
  Administered 2017-12-31: 130 mL via INTRAVENOUS

## 2017-12-31 MED ORDER — LABETALOL HCL 5 MG/ML IV SOLN: 10.0000 mg | INTRAVENOUS | Status: DC | PRN

## 2017-12-31 MED ORDER — SODIUM CHLORIDE 0.9 % IV SOLN
INTRAVENOUS | Status: DC
  Administered 2017-12-31: 06:00:00 via INTRAVENOUS

## 2017-12-31 MED ORDER — HEPARIN SODIUM (PORCINE) 1000 UNIT/ML IJ SOLN
INTRAMUSCULAR | Status: AC
  Filled 2017-12-31: qty 1

## 2017-12-31 MED ORDER — HEPARIN (PORCINE) IN NACL 1000-0.9 UT/500ML-% IV SOLN
INTRAVENOUS | Status: AC
  Filled 2017-12-31: qty 1000

## 2017-12-31 MED ORDER — HEPARIN (PORCINE) IN NACL 1000-0.9 UT/500ML-% IV SOLN
INTRAVENOUS | Status: DC | PRN
  Administered 2017-12-31 (×2): 500 mL

## 2017-12-31 MED ORDER — SODIUM CHLORIDE 0.9 % IV SOLN: 250.0000 mL | INTRAVENOUS | Status: DC | PRN

## 2017-12-31 MED ORDER — ACETAMINOPHEN 325 MG PO TABS: 650.0000 mg | ORAL_TABLET | ORAL | Status: DC | PRN

## 2017-12-31 SURGICAL SUPPLY — 20 items
BALLN MUSTANG 7.0X40 135 (BALLOONS) ×4
BALLOON MUSTANG 7.0X40 135 (BALLOONS) IMPLANT
CATH OMNI FLUSH 5F 65CM (CATHETERS) ×1 IMPLANT
CLOSURE MYNX CONTROL 6F/7F (Vascular Products) ×1 IMPLANT
DEVICE TORQUE H2O (MISCELLANEOUS) ×1 IMPLANT
DRAPE ZERO GRAVITY STERILE (DRAPES) ×1 IMPLANT
GUIDEWIRE ANGLED .035X150CM (WIRE) ×1 IMPLANT
KIT ENCORE 26 ADVANTAGE (KITS) ×1 IMPLANT
KIT MICROPUNCTURE NIT STIFF (SHEATH) ×1 IMPLANT
KIT PV (KITS) ×4 IMPLANT
SHEATH PINNACLE 5F 10CM (SHEATH) ×1 IMPLANT
SHEATH PINNACLE 7F 10CM (SHEATH) ×1 IMPLANT
SHEATH PINNACLE ST 7F 45CM (SHEATH) ×1 IMPLANT
SHEATH PROBE COVER 6X72 (BAG) ×1 IMPLANT
STENT ELUVIA 7X40X130 (Permanent Stent) ×1 IMPLANT
SYR MEDRAD MARK V 150ML (SYRINGE) ×4 IMPLANT
TRANSDUCER W/STOPCOCK (MISCELLANEOUS) ×4 IMPLANT
TRAY PV CATH (CUSTOM PROCEDURE TRAY) ×4 IMPLANT
WIRE BENTSON .035X145CM (WIRE) ×1 IMPLANT
WIRE HI TORQ VERSACORE J 260CM (WIRE) ×1 IMPLANT

## 2017-12-31 NOTE — Discharge Instructions (Signed)

## 2017-12-31 NOTE — Op Note (Signed)
    Patient name: Douglas [name] MRN: [id number] DOB: 1965-04-07 Sex: male  12/31/2017 Pre-operative Diagnosis: Left leg claudication Post-operative diagnosis:  Same Surgeon:  [name] Procedure Performed:  1.  Ultrasound-guided access, right femoral artery  2.  Abdominal aortogram  3.  Bilateral lower extremity runoff  4.  Stent, left external iliac artery  5.  Conscious sedation (58 minutes)    Indications: Patient is suffering from lifestyle limiting claudication.  He has known iliac and superficial femoral stenosis.  He is here today for intervention.  Procedure:  The patient was identified in the holding area and taken to room 8.  The patient was then placed supine on the table and prepped and draped in the usual sterile fashion.  A time out was called.  Conscious sedation was administered with use of IV fentanyl Versed under continuous physician and nurse monitoring.  Heart rate, blood pressure, and oxygen saturation were continuously monitored.  Ultrasound was used to evaluate the right common femoral artery.  It was patent .  A digital ultrasound image was acquired.  A micropuncture needle was used to access the right common femoral artery under ultrasound guidance.  An 018 wire was advanced without resistance and a micropuncture sheath was placed.  The 018 wire was removed and a benson wire was placed.  The micropuncture sheath was exchanged for a 5 french sheath.  An omniflush catheter was advanced over the wire to the level of L-1.  An abdominal angiogram was obtained.  Next, the Omni Flush catheter was pulled down to the aortic bifurcation and bilateral lower extremity runoff was performed. Findings:   Aortogram: No significant renal artery stenosis is identified.  The infrarenal abdominal aorta is small in caliber but patent without stenosis.  Right common and external iliac artery are patent.  The left common iliac artery is widely patent.  There is a nearly occlusive  stenosis at the origin of the internal iliac artery on the left and a focal 80% stenosis of the left external iliac artery  Right Lower Extremity: Right common femoral profundofemoral and superficial femoral artery are patent.  There is three-vessel runoff.  Left Lower Extremity: The left common femoral and profundofemoral artery widely patent.  There is a focal 50% stenosis within the left superficial femoral artery.  There is three-vessel runoff.  Intervention: After the above images were acquired the decision was made to proceed with intervention.  Over a 035 wire, a 7 French 45 cm sheath was advanced into the left external iliac artery.  I like to primarily stent the iliac stenosis.  A 7 x 40 Elluvia stent was advanced across the lesion and then deployed.  It was postdilated with a 7 mm balloon.  Completion imaging showed resolution of the stenosis.  At this point catheters and wires were removed.  A long sheath was exchanged out for a short sheath and a Mynx was used for closure  Impression:  #1  Successful stenting of a 80% left external iliac artery stenosis using 7 x 40 Elluvia stent.  #2  50% left femoral artery stenosis.   Juleen China, M.D. [facility] [place] Office: [phone] Pager:  [phone]

## 2017-12-31 NOTE — Interval H&P Note (Signed)
History and Physical Interval Note:  12/31/2017 7:36 AM  Douglas [name]  has presented today for surgery, with the diagnosis of pvd  The various methods of treatment have been discussed with the patient and family. After consideration of risks, benefits and other options for treatment, the patient has consented to  Procedure(s): ABDOMINAL AORTOGRAM W/LOWER EXTREMITY (N/A) as a surgical intervention .  The patient's history has been reviewed, patient examined, no change in status, stable for surgery.  I have reviewed the patient's chart and labs.  Questions were answered to the patient's satisfaction.     [name]

## 2017-12-31 NOTE — Progress Notes (Signed)
Patient and rn ambulated in hallway. No bleeding or hematoma noted

## 2018-01-01 ENCOUNTER — Telehealth: Payer: Self-pay | Admitting: Surgery

## 2018-01-01 NOTE — Telephone Encounter (Signed)
sch appt lvm mld ltr 02/03/18 8am Aorta 9am ABi 915am p/o NP

## 2018-01-01 NOTE — Telephone Encounter (Signed)
-----   Message from Westley Hummer, RN sent at 12/31/2017 10:33 AM EDT ----- Regarding: f/u   ----- Message ----- From: Nada Libman, MD Sent: 12/31/2017   8:44 AM EDT To: Vvs Charge Pool  12/31/2017:  Surgeon:  [name] Procedure Performed:  1.  Ultrasound-guided access, right femoral artery  2.  Abdominal aortogram  3.  Bilateral lower extremity runoff  4.  Stent, left external iliac artery  5.  Conscious sedation (58 minutes)   Follow-up with Rosalita Chessman with ABIs and aortoiliac duplex on the left in 1 month

## 2018-01-03 ENCOUNTER — Other Ambulatory Visit: Payer: Self-pay

## 2018-01-03 DIAGNOSIS — I739 Peripheral vascular disease, unspecified: Secondary | ICD-10-CM

## 2018-01-04 ENCOUNTER — Emergency Department (HOSPITAL_COMMUNITY): Payer: Self-pay

## 2018-01-04 ENCOUNTER — Encounter (HOSPITAL_COMMUNITY): Payer: Self-pay | Admitting: Emergency Medicine

## 2018-01-04 ENCOUNTER — Inpatient Hospital Stay (HOSPITAL_COMMUNITY)
Admission: EM | Admit: 2018-01-04 | Discharge: 2018-01-10 | DRG: 253 | Disposition: A | Discharge status: Home / Self Care | Payer: Self-pay | Attending: Internal Medicine | Admitting: Internal Medicine

## 2018-01-04 ENCOUNTER — Emergency Department (HOSPITAL_COMMUNITY): Admit: 2018-01-04 | Discharge: 2018-01-04 | Disposition: A | Discharge status: Home / Self Care | Payer: Self-pay

## 2018-01-04 ENCOUNTER — Other Ambulatory Visit: Payer: Self-pay

## 2018-01-04 DIAGNOSIS — Z955 Presence of coronary angioplasty implant and graft: Secondary | ICD-10-CM

## 2018-01-04 DIAGNOSIS — L0390 Cellulitis, unspecified: Secondary | ICD-10-CM

## 2018-01-04 DIAGNOSIS — T45515A Adverse effect of anticoagulants, initial encounter: Secondary | ICD-10-CM | POA: Diagnosis not present

## 2018-01-04 DIAGNOSIS — Z8249 Family history of ischemic heart disease and other diseases of the circulatory system: Secondary | ICD-10-CM

## 2018-01-04 DIAGNOSIS — I10 Essential (primary) hypertension: Secondary | ICD-10-CM

## 2018-01-04 DIAGNOSIS — A419 Sepsis, unspecified organism: Secondary | ICD-10-CM | POA: Diagnosis present

## 2018-01-04 DIAGNOSIS — R0789 Other chest pain: Secondary | ICD-10-CM | POA: Diagnosis present

## 2018-01-04 DIAGNOSIS — J9811 Atelectasis: Secondary | ICD-10-CM | POA: Diagnosis present

## 2018-01-04 DIAGNOSIS — I739 Peripheral vascular disease, unspecified: Secondary | ICD-10-CM

## 2018-01-04 DIAGNOSIS — Z79899 Other long term (current) drug therapy: Secondary | ICD-10-CM

## 2018-01-04 DIAGNOSIS — Z886 Allergy status to analgesic agent status: Secondary | ICD-10-CM

## 2018-01-04 DIAGNOSIS — X58XXXA Exposure to other specified factors, initial encounter: Secondary | ICD-10-CM | POA: Diagnosis not present

## 2018-01-04 DIAGNOSIS — F419 Anxiety disorder, unspecified: Secondary | ICD-10-CM | POA: Diagnosis present

## 2018-01-04 DIAGNOSIS — I708 Atherosclerosis of other arteries: Secondary | ICD-10-CM | POA: Diagnosis present

## 2018-01-04 DIAGNOSIS — Z7902 Long term (current) use of antithrombotics/antiplatelets: Secondary | ICD-10-CM

## 2018-01-04 DIAGNOSIS — I2510 Atherosclerotic heart disease of native coronary artery without angina pectoris: Secondary | ICD-10-CM | POA: Diagnosis present

## 2018-01-04 DIAGNOSIS — R079 Chest pain, unspecified: Secondary | ICD-10-CM | POA: Diagnosis present

## 2018-01-04 DIAGNOSIS — T39015A Adverse effect of aspirin, initial encounter: Secondary | ICD-10-CM | POA: Diagnosis not present

## 2018-01-04 DIAGNOSIS — I252 Old myocardial infarction: Secondary | ICD-10-CM

## 2018-01-04 DIAGNOSIS — T827XXA Infection and inflammatory reaction due to other cardiac and vascular devices, implants and grafts, initial encounter: Principal | ICD-10-CM | POA: Diagnosis present

## 2018-01-04 DIAGNOSIS — Y838 Other surgical procedures as the cause of abnormal reaction of the patient, or of later complication, without mention of misadventure at the time of the procedure: Secondary | ICD-10-CM | POA: Diagnosis present

## 2018-01-04 DIAGNOSIS — Z951 Presence of aortocoronary bypass graft: Secondary | ICD-10-CM

## 2018-01-04 DIAGNOSIS — Z801 Family history of malignant neoplasm of trachea, bronchus and lung: Secondary | ICD-10-CM

## 2018-01-04 DIAGNOSIS — J439 Emphysema, unspecified: Secondary | ICD-10-CM | POA: Diagnosis present

## 2018-01-04 DIAGNOSIS — Z95820 Peripheral vascular angioplasty status with implants and grafts: Secondary | ICD-10-CM

## 2018-01-04 DIAGNOSIS — Z87891 Personal history of nicotine dependence: Secondary | ICD-10-CM

## 2018-01-04 DIAGNOSIS — K219 Gastro-esophageal reflux disease without esophagitis: Secondary | ICD-10-CM

## 2018-01-04 DIAGNOSIS — B9561 Methicillin susceptible Staphylococcus aureus infection as the cause of diseases classified elsewhere: Secondary | ICD-10-CM | POA: Diagnosis present

## 2018-01-04 DIAGNOSIS — S20219A Contusion of unspecified front wall of thorax, initial encounter: Secondary | ICD-10-CM | POA: Diagnosis not present

## 2018-01-04 DIAGNOSIS — R1909 Other intra-abdominal and pelvic swelling, mass and lump: Secondary | ICD-10-CM

## 2018-01-04 DIAGNOSIS — T45525A Adverse effect of antithrombotic drugs, initial encounter: Secondary | ICD-10-CM | POA: Diagnosis not present

## 2018-01-04 DIAGNOSIS — F4310 Post-traumatic stress disorder, unspecified: Secondary | ICD-10-CM | POA: Diagnosis present

## 2018-01-04 DIAGNOSIS — L03314 Cellulitis of groin: Secondary | ICD-10-CM | POA: Diagnosis present

## 2018-01-04 DIAGNOSIS — E785 Hyperlipidemia, unspecified: Secondary | ICD-10-CM

## 2018-01-04 DIAGNOSIS — I724 Aneurysm of artery of lower extremity: Secondary | ICD-10-CM

## 2018-01-04 DIAGNOSIS — Z91013 Allergy to seafood: Secondary | ICD-10-CM

## 2018-01-04 DIAGNOSIS — R000 Tachycardia, unspecified: Secondary | ICD-10-CM | POA: Diagnosis present

## 2018-01-04 LAB — CBC
HEMATOCRIT: 51.1 % (ref 39.0–52.0)
HEMOGLOBIN: 16.7 g/dL (ref 13.0–17.0)
MCH: 32.4 pg (ref 26.0–34.0)
MCHC: 32.7 g/dL (ref 30.0–36.0)
MCV: 99 fL (ref 80.0–100.0)
NRBC: 0 % (ref 0.0–0.2)
Platelets: 314 10*3/uL (ref 150–400)
RBC: 5.16 MIL/uL (ref 4.22–5.81)
RDW: 13.2 % (ref 11.5–15.5)
WBC: 10.1 10*3/uL (ref 4.0–10.5)

## 2018-01-04 LAB — HEPATIC FUNCTION PANEL
ALBUMIN: 3.3 g/dL — AB (ref 3.5–5.0)
ALT: 14 U/L (ref 0–44)
AST: 24 U/L (ref 15–41)
Alkaline Phosphatase: 96 U/L (ref 38–126)
BILIRUBIN TOTAL: 0.2 mg/dL — AB (ref 0.3–1.2)
Bilirubin, Direct: <0.1 mg/dL (ref 0.0–0.2)
TOTAL PROTEIN: 5.9 g/dL — AB (ref 6.5–8.1)

## 2018-01-04 LAB — URINALYSIS, ROUTINE W REFLEX MICROSCOPIC
Bilirubin Urine: NEGATIVE
Glucose, UA: NEGATIVE mg/dL
Hgb urine dipstick: NEGATIVE
KETONES UR: NEGATIVE mg/dL
Leukocytes, UA: NEGATIVE
NITRITE: NEGATIVE
PROTEIN: NEGATIVE mg/dL
Specific Gravity, Urine: 1.025 (ref 1.005–1.030)
pH: 5 (ref 5.0–8.0)

## 2018-01-04 LAB — LIPASE, BLOOD: LIPASE: 34 U/L (ref 11–51)

## 2018-01-04 LAB — BASIC METABOLIC PANEL
Anion gap: 10 (ref 5–15)
BUN: 7 mg/dL (ref 6–20)
CHLORIDE: 105 mmol/L (ref 98–111)
CO2: 21 mmol/L — AB (ref 22–32)
Calcium: 8.5 mg/dL — ABNORMAL LOW (ref 8.9–10.3)
Creatinine, Ser: 1.05 mg/dL (ref 0.61–1.24)
GFR calc non Af Amer: >60 mL/min (ref >60)
Glucose, Bld: 85 mg/dL (ref 70–99)
POTASSIUM: 4 mmol/L (ref 3.5–5.1)
SODIUM: 136 mmol/L (ref 135–145)

## 2018-01-04 LAB — TROPONIN I

## 2018-01-04 LAB — I-STAT CG4 LACTIC ACID, ED
LACTIC ACID, VENOUS: 2.08 mmol/L — AB (ref 0.5–1.9)
LACTIC ACID, VENOUS: 0.48 mmol/L — AB (ref 0.5–1.9)

## 2018-01-04 LAB — I-STAT TROPONIN, ED: Troponin i, poc: 0.01 ng/mL (ref 0.00–0.08)

## 2018-01-04 MED ORDER — VANCOMYCIN HCL IN DEXTROSE 750-5 MG/150ML-% IV SOLN
750.0000 mg | Freq: Two times a day (BID) | INTRAVENOUS | Status: DC
  Administered 2018-01-05 – 2018-01-09 (×9): 750 mg via INTRAVENOUS
  Filled 2018-01-04 (×10): qty 150

## 2018-01-04 MED ORDER — MORPHINE SULFATE (PF) 4 MG/ML IV SOLN
INTRAVENOUS | Status: AC
  Filled 2018-01-04: qty 1

## 2018-01-04 MED ORDER — PIPERACILLIN-TAZOBACTAM 3.375 G IVPB 30 MIN
3.3750 g | Freq: Once | INTRAVENOUS | Status: AC
  Administered 2018-01-04: 3.375 g via INTRAVENOUS
  Filled 2018-01-04: qty 50

## 2018-01-04 MED ORDER — MORPHINE SULFATE (PF) 4 MG/ML IV SOLN: 4.0000 mg | Freq: Once | INTRAVENOUS | Status: DC

## 2018-01-04 MED ORDER — IOPAMIDOL (ISOVUE-370) INJECTION 76%
100.0000 mL | Freq: Once | INTRAVENOUS | Status: AC | PRN
  Administered 2018-01-04: 100 mL via INTRAVENOUS

## 2018-01-04 MED ORDER — ONDANSETRON HCL 4 MG/2ML IJ SOLN
4.0000 mg | Freq: Four times a day (QID) | INTRAMUSCULAR | Status: DC | PRN
  Administered 2018-01-05 – 2018-01-06 (×2): 4 mg via INTRAVENOUS
  Filled 2018-01-04 (×3): qty 2

## 2018-01-04 MED ORDER — ROSUVASTATIN CALCIUM 10 MG PO TABS
40.0000 mg | ORAL_TABLET | Freq: Every day | ORAL | Status: DC
  Administered 2018-01-05 – 2018-01-09 (×5): 40 mg via ORAL
  Filled 2018-01-04 (×6): qty 4

## 2018-01-04 MED ORDER — FAMOTIDINE 20 MG PO TABS
20.0000 mg | ORAL_TABLET | Freq: Every day | ORAL | Status: DC | PRN
  Administered 2018-01-05 – 2018-01-06 (×2): 20 mg via ORAL
  Filled 2018-01-04 (×2): qty 1

## 2018-01-04 MED ORDER — MORPHINE SULFATE (PF) 4 MG/ML IV SOLN
4.0000 mg | Freq: Once | INTRAVENOUS | Status: AC
  Administered 2018-01-04: 4 mg via INTRAVENOUS

## 2018-01-04 MED ORDER — VANCOMYCIN HCL 10 G IV SOLR
1250.0000 mg | Freq: Once | INTRAVENOUS | Status: AC
  Administered 2018-01-04: 1250 mg via INTRAVENOUS
  Filled 2018-01-04: qty 1250

## 2018-01-04 MED ORDER — HYDROCODONE-ACETAMINOPHEN 5-325 MG PO TABS
1.0000 | ORAL_TABLET | Freq: Four times a day (QID) | ORAL | Status: DC | PRN
  Administered 2018-01-05 – 2018-01-08 (×14): 2 via ORAL
  Filled 2018-01-04 (×14): qty 2

## 2018-01-04 MED ORDER — PIPERACILLIN-TAZOBACTAM 3.375 G IVPB
3.3750 g | Freq: Three times a day (TID) | INTRAVENOUS | Status: DC
  Administered 2018-01-05 – 2018-01-09 (×14): 3.375 g via INTRAVENOUS
  Filled 2018-01-04 (×16): qty 50

## 2018-01-04 MED ORDER — SODIUM CHLORIDE 0.9 % IV BOLUS
1000.0000 mL | Freq: Once | INTRAVENOUS | Status: AC
  Administered 2018-01-04: 1000 mL via INTRAVENOUS

## 2018-01-04 MED ORDER — VANCOMYCIN HCL IN DEXTROSE 1-5 GM/200ML-% IV SOLN: 1000.0000 mg | Freq: Once | INTRAVENOUS | Status: DC

## 2018-01-04 MED ORDER — NITROGLYCERIN 0.4 MG SL SUBL: 0.4000 mg | SUBLINGUAL_TABLET | SUBLINGUAL | Status: DC | PRN

## 2018-01-04 MED ORDER — MORPHINE SULFATE (PF) 4 MG/ML IV SOLN: 4.0000 mg | INTRAVENOUS | Status: DC

## 2018-01-04 MED ORDER — ENOXAPARIN SODIUM 40 MG/0.4ML ~~LOC~~ SOLN
40.0000 mg | Freq: Every day | SUBCUTANEOUS | Status: DC
  Administered 2018-01-05 – 2018-01-06 (×2): 40 mg via SUBCUTANEOUS
  Filled 2018-01-04 (×3): qty 0.4

## 2018-01-04 MED ORDER — CLOPIDOGREL BISULFATE 75 MG PO TABS
75.0000 mg | ORAL_TABLET | Freq: Every day | ORAL | Status: DC
  Administered 2018-01-05 – 2018-01-10 (×6): 75 mg via ORAL
  Filled 2018-01-04 (×6): qty 1

## 2018-01-04 MED ORDER — METOPROLOL TARTRATE 25 MG PO TABS: 25.0000 mg | ORAL_TABLET | Freq: Two times a day (BID) | ORAL | Status: DC

## 2018-01-04 MED ORDER — MORPHINE SULFATE (PF) 4 MG/ML IV SOLN
4.0000 mg | Freq: Once | INTRAVENOUS | Status: AC
  Administered 2018-01-04: 4 mg via INTRAVENOUS
  Filled 2018-01-04: qty 1

## 2018-01-04 MED ORDER — IOPAMIDOL (ISOVUE-370) INJECTION 76%
INTRAVENOUS | Status: AC
  Filled 2018-01-04: qty 100

## 2018-01-04 MED ORDER — ASPIRIN 325 MG PO TABS
325.0000 mg | ORAL_TABLET | Freq: Once | ORAL | Status: AC
  Administered 2018-01-04: 325 mg via ORAL
  Filled 2018-01-04: qty 1

## 2018-01-04 MED ORDER — ACETAMINOPHEN 325 MG PO TABS: 650.0000 mg | ORAL_TABLET | Freq: Four times a day (QID) | ORAL | Status: DC | PRN

## 2018-01-04 MED ORDER — SODIUM CHLORIDE 0.9 % IV SOLN
INTRAVENOUS | Status: DC
  Administered 2018-01-04 – 2018-01-05 (×2): via INTRAVENOUS

## 2018-01-04 NOTE — ED Notes (Signed)
ED Provider at bedside. 

## 2018-01-04 NOTE — ED Notes (Signed)
Contacted lab to add on blood work.  

## 2018-01-04 NOTE — ED Notes (Addendum)
Patient transported to CT 

## 2018-01-04 NOTE — ED Notes (Signed)
I-Stat Lactic Acid results of 2.08 reported to Dr. Silverio Lay.

## 2018-01-04 NOTE — Progress Notes (Signed)
Pharmacy Antibiotic Note  Douglas [name] is a 52 y.o. male admitted on 01/04/2018 with sepsis, possible cellulitis.  Pharmacy has been consulted for vancomycin/zosyn dosing. Afeb, WBC 10.1, LA 2.08. SCr 1.05 on admit, CrCl~80.  Plan: Zosyn 3.375g IV ( infusion) x1; then 3.375g IV q8h (4h infusion) Vancomycin 1250mg  IV x1; then 750mg  IV q12h Monitor clinical progress, c/s, renal function F/u de-escalation plan/LOT, vancomycin trough as indicated      Temp (24hrs), Avg:98.2 F (36.8 C), Min:98.2 F (36.8 C), Max:98.2 F (36.8 C)  Recent Labs  Lab 12/31/17 0627 01/04/18 1642 01/04/18 1716  WBC  --  10.1  --   CREATININE 1.10 1.05  --   LATICACIDVEN  --   --  2.08*    Estimated Creatinine Clearance: 80.1 mL/min (by C-G formula based on SCr of 1.05 mg/dL).    Allergies  Allergen Reactions  . Ibuprofen Nausea And Vomiting    UPSETS STOMACH  . Shellfish Allergy Nausea And Vomiting    Antimicrobials this admission: 10/26 vancomycin >>  10/26 zosyn >>   Dose adjustments this admission:   Microbiology results:   Babs Bertin, PharmD, BCPS Clinical Pharmacist 01/04/2018 9:21 PM

## 2018-01-04 NOTE — ED Provider Notes (Signed)
[facility] EMERGENCY DEPARTMENT Provider Note   CSN: [id number] Arrival date & time: 01/04/18  1622     History   Chief Complaint Chief Complaint  Patient presents with  . Chest Pain    HPI Douglas [name] is a 52 y.o. male hx of CAD s/p CABG and stents, PVD, NSTEMI, here presenting with chest pain, shortness of breath, chills.  Patient had L iliac stent placed on 10/22 by vascular surgery.  Patient states that he had the right femoral catheter placed and he noticed increasing swelling and pain at the catheterization site.  Patient states that he has some chills last several days.  This morning he developed some substernal chest pain as well as worsening right leg pain.  Patient also has some pleuritic chest pain as well and subjective shortness of breath.   The history is provided by the patient.    Past Medical History:  Diagnosis Date  . Anginal pain (HCC)   . Coronary artery disease   . GERD (gastroesophageal reflux disease)   . History of acute pancreatitis 04/06/2015  . NSTEMI (non-ST elevated myocardial infarction) (HCC) 01/2014  . Pancreatitis   . PTSD (post-traumatic stress disorder)   . Tobacco abuse     Patient Active Problem List   Diagnosis Date Noted  . Peripheral vascular disease of extremity with claudication (HCC) 11/06/2017  . Chest pain 11/06/2017  . Aortic atherosclerosis (HCC) 04/10/2017  . Patient's noncompliance with other medical treatment and regimen 04/10/2017  . CAD (coronary artery disease), native coronary artery 03/15/2016  . Pancreatic divisum 04/18/2015  . History of acute pancreatitis 04/06/2015  . Alcohol abuse 03/09/2015  . Fatty liver 03/09/2015  . Unstable angina (HCC) 01/18/2014  . Tobacco abuse     Past Surgical History:  Procedure Laterality Date  . ABDOMINAL AORTOGRAM N/A 12/31/2017   Procedure: ABDOMINAL AORTOGRAM;  Surgeon: Nada Libman, MD;  Location: [facility] INVASIVE CV LAB;  Service: Cardiovascular;   Laterality: N/A;  . ABDOMINAL AORTOGRAM W/LOWER EXTREMITY N/A 11/06/2017   Procedure: ABDOMINAL AORTOGRAM W/LOWER EXTREMITY;  Surgeon: Kathleene Hazel, MD;  Location: [facility] INVASIVE CV LAB;  Service: Cardiovascular;  Laterality: N/A;  . CARDIAC CATHETERIZATION N/A 03/14/2016   Procedure: Left Heart Cath and Coronary Angiography;  Surgeon: Kathleene Hazel, MD;  Location: [facility] INVASIVE CV LAB;  Service: Cardiovascular;  Laterality: N/A;  . CORONARY ANGIOPLASTY WITH STENT PLACEMENT  01/18/2014   "1"  . CORONARY ARTERY BYPASS GRAFT N/A 03/19/2016   Procedure: CORONARY ARTERY BYPASS GRAFTING (CABG), ON PUMP, TIMES FIVE, USING LEFT INTERNAL MAMMARY ARTERY AND RIGHT GREATER SAPHENOUS VEIN HARVESTED ENDOSCOPICALLY;  Surgeon: Delight Ovens, MD;  Location: [facility] OR;  Service: Open Heart Surgery;  Laterality: N/A;  LIMA-LAD SVG-OM SVG-DIAG SEQ SVG-PD-PL  . CORONARY BALLOON ANGIOPLASTY N/A 04/11/2017   Procedure: CORONARY BALLOON ANGIOPLASTY;  Surgeon: Yvonne Kendall, MD;  Location: [facility] INVAS[facility] CV LAB;  Service: Cardiovascular;  Laterality: N/A;  Distal RCA  . CORONARY STENT INTERVENTION N/A 11/06/2017   Procedure: CORONARY STENT INTERVENTION;  Surgeon: Kathleene Hazel, MD;  Location: [facility] INVASIVE CV LAB;  Service: Cardiovascular;  Laterality: N/A;  . INTRAVASCULAR PRESSURE WIRE/FFR STUDY N/A 04/11/2017   Procedure: INTRAVASCULAR PRESSURE WIRE/FFR STUDY;  Surgeon: Yvonne Kendall, MD;  Location: [facility] CV LAB;  Service: Cardiovascular;  Laterality: N/A;  . LEFT HEART CATH AND CORS/GRAFTS ANGIOGRAPHY N/A 04/11/2017   Procedure: LEFT HEART CATH AND CORS/GRAFTS ANGIOGRAPHY;  Surgeon: Yvonne Kendall, MD;  Location: [facility] INVASIVE CV  LAB;  Service: Cardiovascular;  Laterality: N/A;  . LEFT HEART CATH AND CORS/GRAFTS ANGIOGRAPHY N/A 11/06/2017   Procedure: LEFT HEART CATH AND CORS/GRAFTS ANGIOGRAPHY;  Surgeon: Kathleene Hazel, MD;  Location: [facility] INV[facility] CV LAB;  Service: Cardiovascular;   Laterality: N/A;  . LEFT HEART CATHETERIZATION WITH CORONARY ANGIOGRAM N/A 01/18/2014   Procedure: LEFT HEART CATHETERIZATION WITH CORONARY ANGIOGRAM;  Surgeon: Runell Gess, MD;  Location: [facility] CATH LAB;  Service: Cardiovascular;  Laterality: N/A;  . LOWER EXTREMITY ANGIOGRAPHY Bilateral 12/31/2017   Procedure: Lower Extremity Angiography;  Surgeon: Nada Libman, MD;  Location: [facility] INV[facility] CV LAB;  Service: Cardiovascular;  Laterality: Bilateral;  . PERIPHERAL VASCULAR INTERVENTION Left 12/31/2017   Procedure: PERIPHERAL VASCULAR INTERVENTION;  Surgeon: Nada Libman, MD;  Location: [facility] INVASIVE CV LAB;  Service: Cardiovascular;  Laterality: Left;  ext iliac  . TEE WITHOUT CARDIOVERSION N/A 03/19/2016   Procedure: TRANSESOPHAGEAL ECHOCARDIOGRAM (TEE);  Surgeon: Delight Ovens, MD;  Location: [facility] OR;  Service: Open Heart Surgery;  Laterality: N/A;        Home Medications    Prior to Admission medications   Medication Sig Start Date End Date Taking? Authorizing Provider  amLODipine (NORVASC) 10 MG tablet Take 1 tablet (10 mg total) by mouth daily. 08/22/17   Berton Mount I, MD  clopidogrel (PLAVIX) 75 MG tablet Take 1 tablet (75 mg total) by mouth daily. 11/08/17   Burnadette Pop, MD  famotidine (PEPCID) 20 MG tablet Take 1 tablet (20 mg total) by mouth 2 (two) times daily. Patient taking differently: Take 20 mg by mouth daily as needed for heartburn.  11/07/17   Burnadette Pop, MD  metoprolol tartrate (LOPRESSOR) 25 MG tablet Take 1 tablet (25 mg total) by mouth 2 (two) times daily. 08/21/17   Barnetta Chapel, MD  nitroGLYCERIN (NITROSTAT) 0.4 MG SL tablet Place 1 tablet (0.4 mg total) under the tongue every 5 (five) minutes as needed for chest pain. 04/12/17   Lonia Blood, MD  rosuvastatin (CRESTOR) 40 MG tablet Take 1 tablet (40 mg total) by mouth daily at 6 PM. 11/07/17   Burnadette Pop, MD    Family History Family History  Problem Relation Age of Onset  . Cancer  Mother        Lung cancer  . Coronary artery disease Mother   . Heart failure Maternal Grandmother     Social History Social History   Tobacco Use  . Smoking status: Former Smoker    Packs/day: 1.00    Years: 31.00    Pack years: 31.00    Types: Cigarettes  . Smokeless tobacco: Never Used  Substance Use Topics  . Alcohol use: Not Currently    Alcohol/week: 1.0 standard drinks    Types: 1 Cans of beer per week    Comment: occ  . Drug use: Not Currently    Types: Marijuana    Comment: denies 06/17/2015     Allergies   Ibuprofen and Shellfish allergy   Review of Systems Review of Systems  Respiratory: Positive for shortness of breath.   Cardiovascular: Positive for chest pain.  Musculoskeletal:       R groin pain   All other systems reviewed and are negative.    Physical Exam Updated Vital Signs BP (!) 160/94   Pulse 99   Temp 98.2 F (36.8 C) (Oral)   Resp 17   SpO2 99%   Physical Exam  Constitutional: He is oriented to person, place, and time.  Slightly uncomfortable   HENT:  Head: Normocephalic.  Eyes: Pupils are equal, round, and reactive to light. EOM are normal.  Neck: Normal range of motion.  Cardiovascular: Normal rate, regular rhythm and normal pulses.  Pulmonary/Chest: Effort normal and breath sounds normal.  Abdominal: Soft. Bowel sounds are normal.  Musculoskeletal:  R groin with some redness, no obvious bruit   Neurological: He is alert and oriented to person, place, and time.  Skin: Skin is warm. Capillary refill takes less than 2 seconds.  Psychiatric: He has a normal mood and affect. His behavior is normal.  Nursing note and vitals reviewed.    ED Treatments / Results  Labs (all labs ordered are listed, but only abnormal results are displayed) Labs Reviewed  BASIC METABOLIC PANEL - Abnormal; Notable for the following components:      Result Value   CO2 21 (*)    Calcium 8.5 (*)    All other components within normal limits    HEPATIC FUNCTION PANEL - Abnormal; Notable for the following components:   Total Protein 5.9 (*)    Albumin 3.3 (*)    Total Bilirubin 0.2 (*)    All other components within normal limits  I-STAT CG4 LACTIC ACID, ED - Abnormal; Notable for the following components:   Lactic Acid, Venous 2.08 (*)    All other components within normal limits  CULTURE, BLOOD (ROUTINE X 2)  CULTURE, BLOOD (ROUTINE X 2)  URINE CULTURE  CBC  LIPASE, BLOOD  URINALYSIS, ROUTINE W REFLEX MICROSCOPIC  I-STAT TROPONIN, ED  I-STAT CG4 LACTIC ACID, ED    EKG EKG Interpretation  Date/Time:  Saturday January 04 2018 16:26:02 EDT Ventricular Rate:  124 PR Interval:  146 QRS Duration: 74 QT Interval:  310 QTC Calculation: 445 R Axis:   72 Text Interpretation:  Sinus tachycardia Anterior infarct , age undetermined Abnormal ECG rate faster than previous Confirmed by Richardean Canal [phone]) on 01/04/2018 4:47:42 PM   Radiology No results found.  Procedures Procedures (including critical care time)  Medications Ordered in ED Medications  sodium chloride 0.9 % bolus 1,000 mL (1,000 mLs Intravenous New Bag/Given 01/04/18 1729)  morphine 4 MG/ML injection 4 mg (4 mg Intravenous Given 01/04/18 1729)  iopamidol (ISOVUE-370) 76 % injection 100 mL (100 mLs Intravenous Contrast Given 01/04/18 1823)     Initial Impression / Assessment and Plan / ED Course  I have reviewed the triage vital signs and the nursing notes.  Pertinent labs & imaging results that were available during my care of the patient were reviewed by me and considered in my medical decision making (see chart for details).    Douglas [name] is a 52 y.o. male here with chills, tachycardia, chest pain, SOB, R groin pain. Consider sepsis vs pseudoaneurysm vs PE. Will get labs, CTA chest, R groin arterial study. Will likely need admission to r/o ACS given that he had stents, CABG.   8:45 PM WBC normal, lactate 2.1. CTA showed no PE. US showed  enlarged lymph node but no pseudoanerysm. Still has chest pain. I talked to Dr. Santiago Glad from cardiology and he saw patient. Recommend trend serial troponins. I talked to Dr. Chestine Spore from vascular and he felt that redness in the groin is expected after operation and since there is no pseudoaneurysm, no concern from vascular surgery perspective. R groin is red and swollen, maybe early cellulitis so will give empiric abx. Will admit to hospitalist service    Final Clinical Impressions(s) / ED  Diagnoses   Final diagnoses:  None    ED Discharge Orders    None       Charlynne Pander, MD 01/04/18 2048

## 2018-01-04 NOTE — H&P (Addendum)
History and Physical    Douglas [name] WUJ:[id number] DOB: 1965/04/06 DOA: 01/04/2018  PCP: Patient, No Pcp Per Patient coming from: Home  Chief Complaint: Chest pain, shortness of breath, chills  HPI: Douglas [name] is a 52 y.o. male with medical history significant of coronary artery disease status post CABG and stents, peripheral vascular disease, hyperlipidemia presenting to the hospital for evaluation of chest pain, shortness of breath, and chills.  Patient had a left iliac stent placed on October 22 by vascular surgery.  States since yesterday his right groin appears red swollen.  Area is painful.  He is also been having chills and nausea.  Patient is also complaining of chest pain which started today at rest did not get better with nitroglycerin.  The pain is left-sided, sharp and pressure-like, and constant in nature.  It is 9 out of 10 intensity.  Associated with nausea and subjective shortness of breath.  ED Course: Afebrile, tachycardic.  Not tachypneic and satting well on room air.  Blood pressure 145/108 on arrival.  No leukocytosis.  Lactic acid 2.0.  LFTs normal.  Lipase normal.  I-STAT troponin negative.  EKG not suggestive of ACS.  CTA negative for PE, edema, or consolidation.  There is bibasilar atelectasis.  Lower extremity Doppler -right groin negative for pseudoaneurysm.  There is edematous tissue, focal area of heterogenicity approximately 1.5 x 1.5 cm (lymph node?) near area of redness and swelling.  ED provider informed me that the patient was seen by cardiology fellow on-call and it is recommended that we continue to trend troponin.  ED provider also spoke to Dr. Chestine Spore from vascular and he felt that redness in the groin is expected after operation and since there is no pseudoaneurysm, no concern from vascular surgery perspective. Vancomycin, Zosyn, 1 L normal saline bolus, and 4 mg IV morphine x2 ordered in the ED.  TR[facility] paged to admit.  Review of Systems: As per HPI  otherwise 10 point review of systems negative.  Past Medical History:  Diagnosis Date  . Anginal pain (HCC)   . Coronary artery disease   . GERD (gastroesophageal reflux disease)   . History of acute pancreatitis 04/06/2015  . NSTEMI (non-ST elevated myocardial infarction) (HCC) 01/2014  . Pancreatitis   . PTSD (post-traumatic stress disorder)   . Tobacco abuse     Past Surgical History:  Procedure Laterality Date  . ABDOMINAL AORTOGRAM N/A 12/31/2017   Procedure: ABDOMINAL AORTOGRAM;  Surgeon: Nada Libman, MD;  Location: [facility];  Service: Cardiovascular;  Laterality: N/A;  . ABDOMINAL AORTOGRAM W/LOWER EXTREMITY N/A 11/06/2017   Procedure: ABDOMINAL AORTOGRAM W/LOWER EXTREMITY;  Surgeon: Kathleene Hazel, MD;  Location: [facility];  Service: Cardiovascular;  Laterality: N/A;  . CARDIAC CATHETERIZATION N/A 03/14/2016   Procedure: Left Heart Cath and Coronary Angiography;  Surgeon: Kathleene Hazel, MD;  Location: [facility] INVASIVE CV LAB;  Service: Cardiovascular;  Laterality: N/A;  . CORONARY ANGIOPLASTY WITH STENT PLACEMENT  01/18/2014   "1"  . CORONARY ARTERY BYPASS GRAFT N/A 03/19/2016   Procedure: CORONARY ARTERY BYPASS GRAFTING (CABG), ON PUMP, TIMES FIVE, USING LEFT INTERNAL MAMMARY ARTERY AND RIGHT GREATER SAPHENOUS VEIN HARVESTED ENDOSCOPICALLY;  Surgeon: Delight Ovens, MD;  Location: [facility] OR;  Service: Open Heart Surgery;  Laterality: N/A;  LIMA-LAD SVG-OM SVG-DIAG SEQ SVG-PD-PL  . CORONARY BALLOON ANGIOPLASTY N/A 04/11/2017   Procedure: CORONARY BALLOON ANGIOPLASTY;  Surgeon: Yvonne Kendall, MD;  Location: [facility];  Service: Cardiovascular;  Laterality: N/A;  Distal RCA  . CORONARY STENT INTERVENTION N/A 11/06/2017   Procedure: CORONARY STENT INTERVENTION;  Surgeon: Kathleene Hazel, MD;  Location: [facility];  Service: Cardiovascular;  Laterality: N/A;  . INTRAVASCULAR PRESSURE WIRE/FFR STUDY N/A 04/11/2017   Procedure:  INTRAVASCULAR PRESSURE WIRE/FFR STUDY;  Surgeon: Yvonne Kendall, MD;  Location: [facility];  Service: Cardiovascular;  Laterality: N/A;  . LEFT HEART CATH AND CORS/GRAFTS ANGIOGRAPHY N/A 04/11/2017   Procedure: LEFT HEART CATH AND CORS/GRAFTS ANGIOGRAPHY;  Surgeon: Yvonne Kendall, MD;  Location: [facility];  Service: Cardiovascular;  Laterality: N/A;  . LEFT HEART CATH AND CORS/GRAFTS ANGIOGRAPHY N/A 11/06/2017   Procedure: LEFT HEART CATH AND CORS/GRAFTS ANGIOGRAPHY;  Surgeon: Kathleene Hazel, MD;  Location: [facility];  Service: Cardiovascular;  Laterality: N/A;  . LEFT HEART CATHETERIZATION WITH CORONARY ANGIOGRAM N/A 01/18/2014   Procedure: LEFT HEART CATHETERIZATION WITH CORONARY ANGIOGRAM;  Surgeon: Runell Gess, MD;  Location[facility] CATH LAB;  Service: Cardiovascular;  Laterality: N/A;  . LOWER EXTREMITY ANGIOGRAPHY Bilateral 12/31/2017   Procedure: Lower Extremity Angiography;  Surgeon: Nada Libman, MD;  Location: [facility];  Service: Cardiovascular;  Laterality: Bilateral;  . PERIPHERAL VASCULAR INTERVENTION Left 12/31/2017   Procedure: PERIPHERAL VASCULAR INTERVENTION;  Surgeon: Nada Libman, MD;  Location: [facility];  Service: Cardiovascular;  Laterality: Left;  ext iliac  . TEE WITHOUT CARDIOVERSION N/A 03/19/2016   Procedure: TRANSESOPHAGEAL ECHOCARDIOGRAM (TEE);  Surgeon: Delight Ovens, MD;  Location: [facility] OR;  Service: Open Heart Surgery;  Laterality: N/A;     reports that he has quit smoking. His smoking use included cigarettes. He has a 31.00 pack-year smoking history. He has never used smokeless tobacco. He reports that he drank about 1.0 standard drinks of alcohol per week. He reports that he has current or past drug history. Drug: Marijuana.  Allergies  Allergen Reactions  . Ibuprofen Nausea And Vomiting    UPSETS STOMACH  . Shellfish Allergy Nausea And Vomiting    Family History  Problem Relation Age of Onset    . Cancer Mother        Lung cancer  . Coronary artery disease Mother   . Heart failure Maternal Grandmother     Prior to Admission medications   Medication Sig Start Date End Date Taking? Authorizing Provider  amLODipine (NORVASC) 10 MG tablet Take 1 tablet (10 mg total) by mouth daily. 08/22/17   Berton Mount I, MD  clopidogrel (PLAVIX) 75 MG tablet Take 1 tablet (75 mg total) by mouth daily. 11/08/17   Burnadette Pop, MD  famotidine (PEPCID) 20 MG tablet Take 1 tablet (20 mg total) by mouth 2 (two) times daily. Patient taking differently: Take 20 mg by mouth daily as needed for heartburn.  11/07/17   Burnadette Pop, MD  metoprolol tartrate (LOPRESSOR) 25 MG tablet Take 1 tablet (25 mg total) by mouth 2 (two) times daily. 08/21/17   Barnetta Chapel, MD  nitroGLYCERIN (NITROSTAT) 0.4 MG SL tablet Place 1 tablet (0.4 mg total) under the tongue every 5 (five) minutes as needed for chest pain. 04/12/17   Lonia Blood, MD  rosuvastatin (CRESTOR) 40 MG tablet Take 1 tablet (40 mg total) by mouth daily at 6 PM. 11/07/17   Burnadette Pop, MD    Physical Exam: Vitals:   01/04/18 1845 01/04/18 1900 01/04/18 1945 01/04/18 2100  BP: (!) 167/95 (!) 158/88 140/78 135/72  Pulse: 98 95 83  80  Resp: 10 15 14 18   Temp:      TempSrc:      SpO2: 98% 96% 90% 100%   Physical Exam  Constitutional: He is oriented to person, place, and time. He appears well-developed and well-nourished.  Resting comfortably in a hospital stretcher watching television.  No acute distress.  HENT:  Head: Normocephalic and atraumatic.  Mouth/Throat: Oropharynx is clear and moist.  Eyes: Right eye exhibits no discharge. Left eye exhibits no discharge.  Neck: Neck supple. No tracheal deviation present.  Cardiovascular: Normal rate, regular rhythm and intact distal pulses.  Pulmonary/Chest: Effort normal and breath sounds normal. No respiratory distress. He has no wheezes. He has no rales.  Speaking comfortably in  full sentences.  No increased work of breathing.  Abdominal: Soft. Bowel sounds are normal. He exhibits no distension. There is no tenderness.  Musculoskeletal: He exhibits no edema.  Neurological: He is alert and oriented to person, place, and time.  Skin: Skin is warm and dry. He is not diaphoretic.  Right groin: Area of tenderness, induration, erythema, and increased warmth noted.   Labs on Admission: I have personally reviewed following labs and imaging studies  CBC: Recent Labs  Lab 12/31/17 0627 01/04/18 1642  WBC  --  10.1  HGB 15.3 16.7  HCT 45.0 51.1  MCV  --  99.0  PLT  --  314   Basic Metabolic Panel: Recent Labs  Lab 12/31/17 0627 01/04/18 1642  NA 141 136  K 3.4* 4.0  CL 109 105  CO2  --  21*  GLUCOSE 109* 85  BUN 3* 7  CREATININE 1.10 1.05  CALCIUM  --  8.5*   GFR: Estimated Creatinine Clearance: 80.1 mL/min (by C-G formula based on SCr of 1.05 mg/dL). Liver Function Tests: Recent Labs  Lab 01/04/18 1642  AST 24  ALT 14  ALKPHOS 96  BILITOT 0.2*  PROT 5.9*  ALBUMIN 3.3*   Recent Labs  Lab 01/04/18 1642  LIPASE 34   No results for input(s): AMMONIA in the last 168 hours. Coagulation Profile: No results for input(s): INR, PROTIME in the last 168 hours. Cardiac Enzymes: No results for input(s): CKTOTAL, CKMB, CKMBINDEX, TROPONINI in the last 168 hours. BNP (last 3 results) No results for input(s): PROBNP in the last 8760 hours. HbA1C: No results for input(s): HGBA1C in the last 72 hours. CBG: No results for input(s): GLUCAP in the last 168 hours. Lipid Profile: No results for input(s): CHOL, HDL, LDLCALC, TRIG, CHOLHDL, LDLDIRECT in the last 72 hours. Thyroid Function Tests: No results for input(s): TSH, T4TOTAL, FREET4, T3FREE, THYROIDAB in the last 72 hours. Anemia Panel: No results for input(s): VITAMINB12, FOLATE, FERRITIN, TIBC, IRON, RETICCTPCT in the last 72 hours. Urine analysis:    Component Value Date/Time   COLORURINE  YELLOW 01/29/2016 0051   APPEARANCEUR CLEAR 01/29/2016 0051   LABSPEC 1.016 01/29/2016 0051   PHURINE 5.5 01/29/2016 0051   GLUCOSEU NEGATIVE 01/29/2016 0051   HGBUR NEGATIVE 01/29/2016 0051   BILIRUBINUR NEGATIVE 01/29/2016 0051   KETONESUR NEGATIVE 01/29/2016 0051   PROTEINUR NEGATIVE 01/29/2016 0051   UROBILINOGEN 1.0 01/15/2013 1135   NITRITE NEGATIVE 01/29/2016 0051   LEUKOCYTESUR NEGATIVE 01/29/2016 0051    Radiological Exams on Admission: Ct Angio Chest Pe W And/or Wo Contrast  Result Date: 01/04/2018 CLINICAL DATA:  Chest pain EXAM: CT ANGIOGRAPHY CHEST WITH CONTRAST TECHNIQUE: Multidetector CT imaging of the chest was performed using the standard protocol during bolus administration of intravenous contrast.  Multiplanar CT image reconstructions and MIPs were obtained to evaluate the vascular anatomy. CONTRAST:  62 mL ISOVUE-370 IOPAMIDOL (ISOVUE-370) INJECTION 76% COMPARISON:  Chest CT April 10, 2016; chest radiograph November 04, 2017 FINDINGS: Cardiovascular: There is no demonstrable pulmonary embolus. There is no thoracic aortic aneurysm or dissection. The visualized great vessels appear unremarkable except for slight calcification in the left subclavian artery. There is aortic atherosclerosis. There are foci of native coronary artery calcification. The patient is status post coronary artery bypass grafting. There is no pericardial effusion or pericardial thickening. Mediastinum/Nodes: Visualized thyroid appears normal. There are subcentimeter mediastinal lymph nodes present. By size criteria, there is no evident thoracic adenopathy. There is a small hiatal hernia. Lungs/Pleura: There are scattered small bullae in the lungs bilaterally. There is bibasilar atelectasis. There is no edema or consolidation. There is a calcified granuloma in the posterior segment of the right upper lobe. No evident pleural thickening or pleural effusion. Upper Abdomen: Visualized adrenal show evidence of a  degree of a hypertrophy, stable. Visualized upper abdominal structures otherwise appear unremarkable. Musculoskeletal: Patient is status post median sternotomy. There are no blastic or lytic bone lesions. No chest wall lesions are apparent. Review of the MIP images confirms the above findings. IMPRESSION: 1. No demonstrable pulmonary embolus. No thoracic aortic aneurysm or dissection. Patient is status post coronary artery bypass grafting. There is aortic atherosclerosis. 2. Areas of scattered bullae as well as bibasilar atelectasis. Calcified granuloma right upper lobe. No edema or consolidation. 3.  No thoracic adenopathy by size criteria. 4.  Small hiatal hernia. 5.  Stable mild adrenal hypertrophy bilaterally. Aortic Atherosclerosis (ICD10-I70.0). Electronically Signed   By: Bretta Bang III M.D.   On: 01/04/2018 19:27    EKG: Independently reviewed.  Sinus tachycardia (heart rate 124).  Assessment/Plan Principal Problem:   Cellulitis Active Problems:   CAD (coronary artery disease), native coronary artery   Chest pain   PVD (peripheral vascular disease) (HCC)   HTN (hypertension)   HLD (hyperlipidemia)   GERD (gastroesophageal reflux disease)   Cellulitis at R groin catheterization access site -Patient had a left external iliac stent placed on October 22 and the access site was the right femoral artery.  He started noticing pain, erythema, and swelling in the area yesterday.  Associated with nausea and chills. -Patient is afebrile but tachycardic. Not tachypnic.  Not hypotensive. Labs showing no leukocytosis.  Lactic acid 2.0. -Doppler -right groin negative for pseudoaneurysm.  There is edematous tissue, focal area of heterogenicity approximately 1.5 x 1.5 cm (lymph node?)  Near area of redness and swelling. -ED provider spoke to Dr. Chestine Spore from vascular and he felt that redness in the groin is expected after operation and since there is no pseudoaneurysm, no concern from vascular  surgery perspective. -Continue vancomycin and Zosyn at this time. Consider de-escalating antibiotic therapy in the morning.  -IV fluid -Received 2 doses of IV morphine 4 mg in the ED.  Continue pain management with Norco 5-325 1 to 2 tablets every 6 hours as needed, Tylenol as needed -Zofran PRN nausea -Blood culture x2 pending -UA, urine culture pending -Continue to trend lactate  Chest pain -History of coronary artery disease status post CABG and stents - I-STAT troponin negative.  EKG not suggestive of ACS.   -Patient was seen by cardiology fellow in the ED.  Recommendation is continue trending troponin. -Trend troponin -Sublingual nitroglycerin as needed  Coronary artery disease status post CABG and stents -Assessment and plan under chest pain -Continue  home aspirin and Plavix  Peripheral vascular disease -Continue home aspirin and Plavix  Hypertension  -Currently normotensive. -Continue home metoprolol.  Hold home amlodipine at this time.  Hyperlipidemia  -Continue home Crestor  GERD -Continue home H2 blocker  DVT prophylaxis: Lovenox Code Status: Patient wishes to be full code. Family Communication: Family at bedside updated. Disposition Plan: Anticipate discharge to home in 1 to 2 days. Consults called: Cardiology (Dr. Santiago Glad) Admission status: Observation   John Giovanni MD [facility] Pager [phone]  If 7PM-7AM, please contact night-coverage [email or web address] Password [email or web address]  01/04/2018, 10:06 PM

## 2018-01-04 NOTE — ED Triage Notes (Signed)
Pt presents with c/o chest pain and shortness of breath. Pt tachycardic on the monitor. Reports fevers at home, swelling/redness/pain to right groin site, stent places to external iliac artery 12/31/17.

## 2018-01-04 NOTE — Progress Notes (Signed)
*  PRELIMINARY RESULTS* Vascular Ultrasound Lower Extremity Arterial Duplex, pseudoaneurysm check has been completed.  Preliminary findings: Right groin appears negative for a pseudo aneurysm.  Edematous tissue noted, focal area of heterogenecity aprox 1.5 x 1.5cm  (lymph node?) near area of reddness and swelling, etiology unknown.  Chauncey Fischer 01/04/2018, 6:24 PM

## 2018-01-05 ENCOUNTER — Other Ambulatory Visit: Payer: Self-pay

## 2018-01-05 DIAGNOSIS — R079 Chest pain, unspecified: Secondary | ICD-10-CM

## 2018-01-05 DIAGNOSIS — R1909 Other intra-abdominal and pelvic swelling, mass and lump: Secondary | ICD-10-CM

## 2018-01-05 DIAGNOSIS — A419 Sepsis, unspecified organism: Secondary | ICD-10-CM | POA: Diagnosis present

## 2018-01-05 DIAGNOSIS — L03314 Cellulitis of groin: Secondary | ICD-10-CM

## 2018-01-05 LAB — TROPONIN I

## 2018-01-05 MED ORDER — MORPHINE SULFATE (PF) 2 MG/ML IV SOLN
2.0000 mg | INTRAVENOUS | Status: DC | PRN
  Administered 2018-01-05 – 2018-01-08 (×12): 2 mg via INTRAVENOUS
  Filled 2018-01-05 (×12): qty 1

## 2018-01-05 MED ORDER — ASPIRIN EC 81 MG PO TBEC
81.0000 mg | DELAYED_RELEASE_TABLET | Freq: Every day | ORAL | Status: DC
  Administered 2018-01-05 – 2018-01-10 (×6): 81 mg via ORAL
  Filled 2018-01-05 (×6): qty 1

## 2018-01-05 MED ORDER — CARVEDILOL 12.5 MG PO TABS
12.5000 mg | ORAL_TABLET | Freq: Two times a day (BID) | ORAL | Status: DC
  Administered 2018-01-05 – 2018-01-10 (×11): 12.5 mg via ORAL
  Filled 2018-01-05 (×11): qty 1

## 2018-01-05 NOTE — Consult Note (Signed)
CARDIOLOGY CONSULT NOTE   Referring Physician: Dr. Loney Loh Primary Physician: None Primary Cardiologist: Dr. Donnie Aho Reason for Consultation: Chest pain  HPI: Douglas [name] is a 52 y.o. male w/ history of hypertension, hyperlipidemia, smoking, chest pain, CAD status post four-vessel CABG in 2018 with subsequent PCI in August 2019, and PAD status post recent left iliac stenting who now presents with right groin pain and chest pain.  In brief, the patient underwent left iliac stent placing on October 22.  His point of access was the right groin.  He has had some right groin discomfort since his procedure, however this did improve initially.  Over the past 2 to 3 days the patient has experienced worsening right groin discomfort, worsening right groin erythema, and focal painful swelling in the right groin.  He has additionally had some subjective fevers.  He has not noticed any drainage from the right groin.  He has noticed that his claudication symptoms of the left leg have improved substantially since his percutaneous stenting procedure.  In addition to the current symptoms described above, the patient also does report some intermittent chest discomfort.  Notably he has chest discomfort frequently, as often as 2-3 times per week.  He does feel as though this has improved slightly since his PCI from August 2019, however his symptoms never completely resolved.  Although the primary reason for his presentation to the hospital today was the right groin symptoms, he does feel as though his chest discomfort is another reason for his presentation.  He does not however feel as though this has substantially worsened lately.  His symptoms typically are associated with exertion and improved with rest.  He has recently taken nitroglycerin for the symptoms, which does not result in any relief.  Review of Systems:     Cardiac Review of Systems: {Y] = yes [ ]  = no  Chest Pain [Y]  Resting SOB [Y] Exertional  SOB  [Y]  Orthopnea [  ]   Pedal Edema [   ]    Palpitations [  ] Syncope  [  ]   Presyncope [   ]  General Review of Systems: [Y] = yes [  ]=no Constitional: recent weight change [  ]; anorexia [  ]; fatigue [  ]; nausea [  ]; night sweats [  ]; fever [Y]; or chills [Y];                                                                     Eyes : blurred vision [  ]; diplopia [   ]; vision changes [  ];  Amaurosis fugax[  ]; Resp: cough [  ];  wheezing[  ];  hemoptysis[  ];  PND [  ];  GI:  gallstones[  ], vomiting[  ];  dysphagia[  ]; melena[  ];  hematochezia [  ]; heartburn[  ];   GU: kidney stones [  ]; hematuria[  ];   dysuria [  ];  nocturia[  ]; incontinence [  ];             Skin: rash, swelling[  ];, hair loss[  ];  peripheral edema[  ];  or itching[  ]; Musculosketetal: myalgias[  ];  joint  swelling[  ];  joint erythema[  ];  joint pain[  ];  back pain[  ];  Heme/Lymph: bruising[  ];  bleeding[  ];  anemia[  ];  Neuro: TIA[  ];  headaches[  ];  stroke[  ];  vertigo[  ];  seizures[  ];   paresthesias[  ];  difficulty walking[  ];  Psych:depression[  ]; anxiety[  ];  Endocrine: diabetes[  ];  thyroid dysfunction[  ];  Other:  Past Medical History:  Diagnosis Date  . Anginal pain (HCC)   . Coronary artery disease   . GERD (gastroesophageal reflux disease)   . History of acute pancreatitis 04/06/2015  . NSTEMI (non-ST elevated myocardial infarction) (HCC) 01/2014  . Pancreatitis   . PTSD (post-traumatic stress disorder)   . Tobacco abuse      (Not in a hospital admission)   . clopidogrel  75 mg Oral Daily  . enoxaparin (LOVENOX) injection  40 mg Subcutaneous Daily  . metoprolol tartrate  25 mg Oral BID  . rosuvastatin  40 mg Oral q1800    Infusions: . sodium chloride 125 mL/hr at 01/04/18 2221  . piperacillin-tazobactam (ZOSYN)  IV    . vancomycin      Allergies  Allergen Reactions  . Ibuprofen Nausea And Vomiting    UPSETS STOMACH  . Shellfish Allergy Nausea And  Vomiting    Social History   Socioeconomic History  . Marital status: Divorced    Spouse name: Not on file  . Number of children: 2  . Years of education: Not on file  . Highest education level: Not on file  Occupational History  . Occupation: Statistician  Social Needs  . Financial resource strain: Not on file  . Food insecurity:    Worry: Not on file    Inability: Not on file  . Transportation needs:    Medical: Not on file    Non-medical: Not on file  Tobacco Use  . Smoking status: Former Smoker    Packs/day: 1.00    Years: 31.00    Pack years: 31.00    Types: Cigarettes  . Smokeless tobacco: Never Used  Substance and Sexual Activity  . Alcohol use: Not Currently    Alcohol/week: 1.0 standard drinks    Types: 1 Cans of beer per week    Comment: occ  . Drug use: Not Currently    Types: Marijuana    Comment: denies 06/17/2015  . Sexual activity: Not Currently  Lifestyle  . Physical activity:    Days per week: Not on file    Minutes per session: Not on file  . Stress: Not on file  Relationships  . Social connections:    Talks on phone: Not on file    Gets together: Not on file    Attends religious service: Not on file    Active member of club or organization: Not on file    Attends meetings of clubs or organizations: Not on file    Relationship status: Not on file  . Intimate partner violence:    Fear of current or ex partner: Not on file    Emotionally abused: Not on file    Physically abused: Not on file    Forced sexual activity: Not on file  Other Topics Concern  . Not on file  Social History Narrative   Divorced.  2 sons.  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    Family History  Problem  Relation Age of Onset  . Cancer Mother        Lung cancer  . Coronary artery disease Mother   . Heart failure Maternal Grandmother     PHYSICAL EXAM: Vitals:   01/04/18 2100 01/04/18 2230  BP: 135/72 (!) 148/94  Pulse: 80 92  Resp:  18 13  Temp:    SpO2: 100% 97%     Intake/Output Summary (Last 24 hours) at 01/05/2018 0015 Last data filed at 01/04/2018 1800 Gross per 24 hour  Intake 1000.01 ml  Output -  Net 1000.01 ml    General:  Well appearing. No respiratory difficulty HEENT: normal Neck: supple. no JVD.  Cor: PMI nondisplaced. Regular rate & rhythm.  Soft systolic murmur heard throughout the precordium.  +S4. Lungs: clear Abdomen: soft, nontender, nondistended. No hepatosplenomegaly. No bruits or masses. Good bowel sounds. Extremities: no cyanosis, clubbing, edema; right groin with a large erythematous patch measuring approximately 15 cm across by 4 cm.  At the center of this erythematous patch, there is a focal, tender, firm focus of swelling measuring approximately 2 cm x 2 cm.  There is no overlying bruit. Neuro: alert & oriented x 3, cranial nerves grossly intact. moves all 4 extremities w/o difficulty. Affect pleasant.  ECG: Sinus tachycardia, heart rate 124 bpm, anterior Q waves, no acute ST or T wave abnormalities.  Results for orders placed or performed during the hospital encounter of 01/04/18 (from the past 24 hour(s))  Basic metabolic panel     Status: Abnormal   Collection Time: 01/04/18  4:42 PM  Result Value Ref Range   Sodium 136 135 - 145 mmol/L   Potassium 4.0 3.5 - 5.1 mmol/L   Chloride 105 98 - 111 mmol/L   CO2 21 (L) 22 - 32 mmol/L   Glucose, Bld 85 70 - 99 mg/dL   BUN 7 6 - 20 mg/dL   Creatinine, Ser 2.95 0.61 - 1.24 mg/dL   Calcium 8.5 (L) 8.9 - 10.3 mg/dL   GFR calc non Af Amer >60 >60 mL/min   GFR calc Af Amer >60 >60 mL/min   Anion gap 10 5 - 15  CBC     Status: None   Collection Time: 01/04/18  4:42 PM  Result Value Ref Range   WBC 10.1 4.0 - 10.5 K/uL   RBC 5.16 4.22 - 5.81 MIL/uL   Hemoglobin 16.7 13.0 - 17.0 g/dL   HCT 62.1 30.8 - 65.7 %   MCV 99.0 80.0 - 100.0 fL   MCH 32.4 26.0 - 34.0 pg   MCHC 32.7 30.0 - 36.0 g/dL   RDW 84.6 96.2 - 95.2 %   Platelets 314  150 - 400 K/uL   nRBC 0.0 0.0 - 0.2 %  Hepatic function panel     Status: Abnormal   Collection Time: 01/04/18  4:42 PM  Result Value Ref Range   Total Protein 5.9 (L) 6.5 - 8.1 g/dL   Albumin 3.3 (L) 3.5 - 5.0 g/dL   AST 24 15 - 41 U/L   ALT 14 0 - 44 U/L   Alkaline Phosphatase 96 38 - 126 U/L   Total Bilirubin 0.2 (L) 0.3 - 1.2 mg/dL   Bilirubin, Direct <8.4 0.0 - 0.2 mg/dL   Indirect Bilirubin NOT CALCULATED 0.3 - 0.9 mg/dL  Lipase, blood     Status: None   Collection Time: 01/04/18  4:42 PM  Result Value Ref Range   Lipase 34 11 - 51 U/L  I-stat troponin,  ED     Status: None   Collection Time: 01/04/18  5:14 PM  Result Value Ref Range   Troponin i, poc 0.01 0.00 - 0.08 ng/mL   Comment 3          I-Stat CG4 Lactic Acid, ED     Status: Abnormal   Collection Time: 01/04/18  5:16 PM  Result Value Ref Range   Lactic Acid, Venous 2.08 (HH) 0.5 - 1.9 mmol/L   Comment NOTIFIED PHYSICIAN   I-Stat CG4 Lactic Acid, ED     Status: Abnormal   Collection Time: 01/04/18  9:29 PM  Result Value Ref Range   Lactic Acid, Venous 0.48 (L) 0.5 - 1.9 mmol/L  Troponin I (q 6hr x 3)     Status: None   Collection Time: 01/04/18 10:02 PM  Result Value Ref Range   Troponin I <0.03 <0.03 ng/mL  Urinalysis, Routine w reflex microscopic     Status: Abnormal   Collection Time: 01/04/18 10:35 PM  Result Value Ref Range   Color, Urine STRAW (A) YELLOW   APPearance CLEAR CLEAR   Specific Gravity, Urine 1.025 1.005 - 1.030   pH 5.0 5.0 - 8.0   Glucose, UA NEGATIVE NEGATIVE mg/dL   Hgb urine dipstick NEGATIVE NEGATIVE   Bilirubin Urine NEGATIVE NEGATIVE   Ketones, ur NEGATIVE NEGATIVE mg/dL   Protein, ur NEGATIVE NEGATIVE mg/dL   Nitrite NEGATIVE NEGATIVE   Leukocytes, UA NEGATIVE NEGATIVE   Ct Angio Chest Pe W And/or Wo Contrast  Result Date: 01/04/2018 CLINICAL DATA:  Chest pain EXAM: CT ANGIOGRAPHY CHEST WITH CONTRAST TECHNIQUE: Multidetector CT imaging of the chest was performed using the  standard protocol during bolus administration of intravenous contrast. Multiplanar CT image reconstructions and MIPs were obtained to evaluate the vascular anatomy. CONTRAST:  62 mL ISOVUE-370 IOPAMIDOL (ISOVUE-370) INJECTION 76% COMPARISON:  Chest CT April 10, 2016; chest radiograph November 04, 2017 FINDINGS: Cardiovascular: There is no demonstrable pulmonary embolus. There is no thoracic aortic aneurysm or dissection. The visualized great vessels appear unremarkable except for slight calcification in the left subclavian artery. There is aortic atherosclerosis. There are foci of native coronary artery calcification. The patient is status post coronary artery bypass grafting. There is no pericardial effusion or pericardial thickening. Mediastinum/Nodes: Visualized thyroid appears normal. There are subcentimeter mediastinal lymph nodes present. By size criteria, there is no evident thoracic adenopathy. There is a small hiatal hernia. Lungs/Pleura: There are scattered small bullae in the lungs bilaterally. There is bibasilar atelectasis. There is no edema or consolidation. There is a calcified granuloma in the posterior segment of the right upper lobe. No evident pleural thickening or pleural effusion. Upper Abdomen: Visualized adrenal show evidence of a degree of a hypertrophy, stable. Visualized upper abdominal structures otherwise appear unremarkable. Musculoskeletal: Patient is status post median sternotomy. There are no blastic or lytic bone lesions. No chest wall lesions are apparent. Review of the MIP images confirms the above findings. IMPRESSION: 1. No demonstrable pulmonary embolus. No thoracic aortic aneurysm or dissection. Patient is status post coronary artery bypass grafting. There is aortic atherosclerosis. 2. Areas of scattered bullae as well as bibasilar atelectasis. Calcified granuloma right upper lobe. No edema or consolidation. 3.  No thoracic adenopathy by size criteria. 4.  Small hiatal hernia.  5.  Stable mild adrenal hypertrophy bilaterally. Aortic Atherosclerosis (ICD10-I70.0). Electronically Signed   By: Bretta Bang III M.D.   On: 01/04/2018 19:27   ASSESSMENT: Douglas [name] is a 52 y.o. male w/  history of hypertension, hyperlipidemia, smoking, chest pain, CAD status post four-vessel CABG in 2018 with subsequent PCI in August 2019, and PAD status post recent left iliac stenting who now presents with right groin pain and chest pain.  Regarding the patient's right groin pain, he underwent vascular ultrasound while in the ED, which was negative for AV fistula, pseudoaneurysm, or hematoma.  Interestingly, the images were suggestive of an enlarged lymph node at the site.  This in addition to the patient's physical exam and subjective report of fevers at home are highly suggestive of an infectious process such as right groin cellulitis in the setting of recent instrumentation.  Would recommend treating initially with IV antibiotics with plans for a full course of p.o. antibiotics to cover iatrogenic soft tissue infection.  Regarding the patient's chest discomfort, it is unclear that this symptom is much of any different than he has been experiencing for the past several months.  Fortunately his ECG is unchanged from baseline and he has had 2 negative troponins drawn 5 hours apart during his time in the emergency department.  His symptoms should be monitored during his current admission.  If his symptoms do recur or are worsening, it would be reasonable to discuss up titrating his antianginal medical therapy.  PLAN/DISCUSSION: - Agree with initial IV antibiotics for right groin cellulitis followed by a course of p.o. antibiotics covering iatrogenic skin and soft tissue infection - Would recommend reviewing vascular duplex ultrasound images with radiology to confirm that there is no underlying vascular abnormality such as pseudoaneurysm - Recommend checking one more troponin overnight  tonight to confirm that the patient has ruled out for acute coronary syndrome - Recommend continuing patient's aspirin 81 mg daily and Plavix 75 mg daily - Recommend switching patient from p.o. metoprolol tartrate 25 mg twice daily to p.o. carvedilol 12.5 mg twice daily - If patient is not able to tolerate switching from metoprolol to carvedilol for blood pressure reasons, would be reasonable to reduce amlodipine to allow for up titration of better cardioprotective medications  Rosario Jacks, MD Cardiology Fellow, PGY-6

## 2018-01-05 NOTE — Plan of Care (Signed)
  Problem: Education: Goal: Knowledge of General Education information will improve Description Including pain rating scale, medication(s)/side effects and non-pharmacologic comfort measures Outcome: Progressing   Problem: Health Behavior/Discharge Planning: Goal: Ability to manage health-related needs will improve Outcome: Progressing   

## 2018-01-05 NOTE — Progress Notes (Signed)
Progress Note    Douglas [name]  ZOX:[id number] DOB: 10-15-1965  DOA: 01/04/2018 PCP: Patient, No Pcp Per    Brief Narrative:     Medical records reviewed and are as summarized below:  Douglas [name] is an 52 y.o. male with medical history significant of coronary artery disease status post CABG and stents, peripheral vascular disease, hyperlipidemia presenting to the hospital for evaluation of chest pain, shortness of breath, and chills.  Patient had a left iliac stent placed on October 22 by vascular surgery.  States since yesterday his right groin appears red swollen.  Area is painful.  He is also been having chills and nausea.  Patient is also complaining of chest pain which started today at rest did not get better with nitroglycerin.   Assessment/Plan:   Principal Problem:   Cellulitis Active Problems:   CAD (coronary artery disease), native coronary artery   Chest pain   PVD (peripheral vascular disease) (HCC)   HTN (hypertension)   HLD (hyperlipidemia)   GERD (gastroesophageal reflux disease)  Cellulitis at R groin catheterization access site with large lymph node -s/p left external iliac stent placed on 10/22 and the access site was the right femoral artery - Associated with nausea and chills. -Doppler -right groin negative for pseudoaneurysm.  There is edematous tissue, focal area of heterogenicity approximately 1.5 x 1.5 cm (lymph node?)  Near area of redness and swelling. -ED provider spoke to Dr. Chestine Spore from vascular and he felt that redness in the groin is expected after operation and since there is no pseudoaneurysm, no concern from vascular surgery perspective. -Continue vancomycin and Zosyn as patient's redness is improving -pain in uncontrolled- PO and IV for refractory pain  -Zofran PRN nausea -Blood culture x2 pending  Chest pain w/ h/o CAD s/p CABG and stents -CE negative -appreciate cardiology consult -CTA negative for PE -recent cath that  showed re-stenosis and another stent was palced   Peripheral vascular disease -on ASA/plavix  Hypertension  -BP on lower side -resume home medications with close management  Hyperlipidemia  -crestor  GERD -continue home meds   Family Communication/Anticipated D/C date and plan/Code Status   DVT prophylaxis: Lovenox ordered. Code Status: Full Code.  Family Communication:  Disposition Plan:    Medical Consultants:    Cards  Vascular (phone)     Subjective:   Redness improved in groin, still c/o pain  Objective:    Vitals:   01/04/18 2230 01/05/18 0200 01/05/18 0239 01/05/18 0605  BP: (!) 148/94 (!) 136/93 (!) 151/95 108/72  Pulse: 92 77 77   Resp: 13 14 (!) 9 (!) 22  Temp:   98.2 F (36.8 C) 98 F (36.7 C)  TempSrc:   Oral Oral  SpO2: 97% 97% 97%     Intake/Output Summary (Last 24 hours) at 01/05/2018 1024 Last data filed at 01/04/2018 1800 Gross per 24 hour  Intake 1000.01 ml  Output -  Net 1000.01 ml   There were no vitals filed for this visit.  Exam: Laying in bed, slightly uncomfortable In right groin, mobile firm what appears to a lymph node about 2 cm x 2cm with decreasing area of redness with well de-marcated boarders (? strep)  Data Reviewed:   I have personally reviewed following labs and imaging studies:  Labs: Labs show the following:   Basic Metabolic Panel: Recent Labs  Lab 12/31/17 0627 01/04/18 1642  NA 141 136  K 3.4* 4.0  CL 109 105  CO2  --  21*  GLUCOSE 109* 85  BUN 3* 7  CREATININE 1.10 1.05  CALCIUM  --  8.5*   GFR Estimated Creatinine Clearance: 80.1 mL/min (by C-G formula based on SCr of 1.05 mg/dL). Liver Function Tests: Recent Labs  Lab 01/04/18 1642  AST 24  ALT 14  ALKPHOS 96  BILITOT 0.2*  PROT 5.9*  ALBUMIN 3.3*   Recent Labs  Lab 01/04/18 1642  LIPASE 34   No results for input(s): AMMONIA in the last 168 hours. Coagulation profile No results for input(s): INR, PROTIME in the  last 168 hours.  CBC: Recent Labs  Lab 12/31/17 0627 01/04/18 1642  WBC  --  10.1  HGB 15.3 16.7  HCT 45.0 51.1  MCV  --  99.0  PLT  --  314   Cardiac Enzymes: Recent Labs  Lab 01/04/18 2202 01/05/18 0354  TROPONINI <0.03 <0.03   BNP (last 3 results) No results for input(s): PROBNP in the last 8760 hours. CBG: No results for input(s): GLUCAP in the last 168 hours. D-Dimer: No results for input(s): DDIMER in the last 72 hours. Hgb A1c: No results for input(s): HGBA1C in the last 72 hours. Lipid Profile: No results for input(s): CHOL, HDL, LDLCALC, TRIG, CHOLHDL, LDLDIRECT in the last 72 hours. Thyroid function studies: No results for input(s): TSH, T4TOTAL, T3FREE, THYROIDAB in the last 72 hours.  Invalid input(s): FREET3 Anemia work up: No results for input(s): VITAMINB12, FOLATE, FERRITIN, TIBC, IRON, RETICCTPCT in the last 72 hours. Sepsis Labs: Recent Labs  Lab 01/04/18 1642 01/04/18 1716 01/04/18 2129  WBC 10.1  --   --   LATICACIDVEN  --  2.08* 0.48*    Microbiology No results found for this or any previous visit (from the past 240 hour(s)).  Procedures and diagnostic studies:  Ct Angio Chest Pe W And/or Wo Contrast  Result Date: 01/04/2018 CLINICAL DATA:  Chest pain EXAM: CT ANGIOGRAPHY CHEST WITH CONTRAST TECHNIQUE: Multidetector CT imaging of the chest was performed using the standard protocol during bolus administration of intravenous contrast. Multiplanar CT image reconstructions and MIPs were obtained to evaluate the vascular anatomy. CONTRAST:  62 mL ISOVUE-370 IOPAMIDOL (ISOVUE-370) INJECTION 76% COMPARISON:  Chest CT April 10, 2016; chest radiograph November 04, 2017 FINDINGS: Cardiovascular: There is no demonstrable pulmonary embolus. There is no thoracic aortic aneurysm or dissection. The visualized great vessels appear unremarkable except for slight calcification in the left subclavian artery. There is aortic atherosclerosis. There are foci of  native coronary artery calcification. The patient is status post coronary artery bypass grafting. There is no pericardial effusion or pericardial thickening. Mediastinum/Nodes: Visualized thyroid appears normal. There are subcentimeter mediastinal lymph nodes present. By size criteria, there is no evident thoracic adenopathy. There is a small hiatal hernia. Lungs/Pleura: There are scattered small bullae in the lungs bilaterally. There is bibasilar atelectasis. There is no edema or consolidation. There is a calcified granuloma in the posterior segment of the right upper lobe. No evident pleural thickening or pleural effusion. Upper Abdomen: Visualized adrenal show evidence of a degree of a hypertrophy, stable. Visualized upper abdominal structures otherwise appear unremarkable. Musculoskeletal: Patient is status post median sternotomy. There are no blastic or lytic bone lesions. No chest wall lesions are apparent. Review of the MIP images confirms the above findings. IMPRESSION: 1. No demonstrable pulmonary embolus. No thoracic aortic aneurysm or dissection. Patient is status post coronary artery bypass grafting. There is aortic atherosclerosis. 2. Areas of scattered bullae as well as bibasilar atelectasis. Calcified granuloma  right upper lobe. No edema or consolidation. 3.  No thoracic adenopathy by size criteria. 4.  Small hiatal hernia. 5.  Stable mild adrenal hypertrophy bilaterally. Aortic Atherosclerosis (ICD10-I70.0). Electronically Signed   By: Bretta Bang III M.D.   On: 01/04/2018 19:27    Medications:   . carvedilol  12.5 mg Oral BID WC  . clopidogrel  75 mg Oral Daily  . enoxaparin (LOVENOX) injection  40 mg Subcutaneous Daily  . rosuvastatin  40 mg Oral q1800   Continuous Infusions: . piperacillin-tazobactam (ZOSYN)  IV 3.375 g (01/05/18 0600)  . vancomycin 750 mg (01/05/18 0950)     LOS: 0 days   Joseph Art  [facility]   *Please refer to [email or web address], password [email or web address] to  get updated schedule on who will round on this patient, as hospitalists switch teams weekly. If 7PM-7AM, please contact night-coverage at [email or web address], password [email or web address] for any overnight needs.  01/05/2018, 10:24 AM

## 2018-01-05 NOTE — Progress Notes (Signed)
Patient seen early this AM by overnight cardiology. No new recs based on this mornings review. Defer management of possible groin cellulitis after recent lower extremity stenting by vascular  to primary team who also discussed with vascular upon admission. Regarding patient's chest pain his CT PE was negative, trops remain negative and EKG without acute ischemic changes. Follow bp's and symptoms today, would titrate antianginals as tolerated.    Dina Rich MD

## 2018-01-06 ENCOUNTER — Inpatient Hospital Stay (HOSPITAL_COMMUNITY): Payer: Self-pay

## 2018-01-06 DIAGNOSIS — I2510 Atherosclerotic heart disease of native coronary artery without angina pectoris: Secondary | ICD-10-CM

## 2018-01-06 DIAGNOSIS — I9789 Other postprocedural complications and disorders of the circulatory system, not elsewhere classified: Secondary | ICD-10-CM

## 2018-01-06 DIAGNOSIS — R072 Precordial pain: Secondary | ICD-10-CM

## 2018-01-06 DIAGNOSIS — I10 Essential (primary) hypertension: Secondary | ICD-10-CM

## 2018-01-06 DIAGNOSIS — E785 Hyperlipidemia, unspecified: Secondary | ICD-10-CM

## 2018-01-06 LAB — CBC
HEMATOCRIT: 43.7 % (ref 39.0–52.0)
Hemoglobin: 14.2 g/dL (ref 13.0–17.0)
MCH: 32.4 pg (ref 26.0–34.0)
MCHC: 32.5 g/dL (ref 30.0–36.0)
MCV: 99.8 fL (ref 80.0–100.0)
Platelets: 236 10*3/uL (ref 150–400)
RBC: 4.38 MIL/uL (ref 4.22–5.81)
RDW: 12.9 % (ref 11.5–15.5)
WBC: 6.4 10*3/uL (ref 4.0–10.5)
nRBC: 0 % (ref 0.0–0.2)

## 2018-01-06 LAB — BASIC METABOLIC PANEL
Anion gap: 5 (ref 5–15)
BUN: <5 mg/dL — ABNORMAL LOW (ref 6–20)
CALCIUM: 8.1 mg/dL — AB (ref 8.9–10.3)
CO2: 25 mmol/L (ref 22–32)
CREATININE: 1.05 mg/dL (ref 0.61–1.24)
Chloride: 107 mmol/L (ref 98–111)
GFR calc non Af Amer: >60 mL/min (ref >60)
GLUCOSE: 93 mg/dL (ref 70–99)
Potassium: 3.9 mmol/L (ref 3.5–5.1)
Sodium: 137 mmol/L (ref 135–145)

## 2018-01-06 LAB — URINE CULTURE: CULTURE: NO GROWTH

## 2018-01-06 MED ORDER — IOPAMIDOL (ISOVUE-370) INJECTION 76%
100.0000 mL | Freq: Once | INTRAVENOUS | Status: AC
  Administered 2018-01-06: 100 mL via INTRAVENOUS

## 2018-01-06 MED ORDER — IOPAMIDOL (ISOVUE-370) INJECTION 76%
INTRAVENOUS | Status: AC
  Filled 2018-01-06: qty 100

## 2018-01-06 MED ORDER — SODIUM CHLORIDE 0.9 % IV SOLN
INTRAVENOUS | Status: DC
  Administered 2018-01-06 – 2018-01-08 (×4): via INTRAVENOUS

## 2018-01-06 NOTE — Consult Note (Signed)
REASON FOR CONSULT:    Swelling right groin with possible infection.  Consult is requested by Dr. Randol Kern.  HPI:   Douglas [name] is a pleasant 52 y.o. male, who had presented with disabling left lower extremity claudication.  He underwent an arteriogram by Dr. Myra Gianotti on 12/31/2017.  There was a right femoral approach.  A left external iliac artery angioplasty and stenting was performed with an excellent result.  The patient was admitted 2 days ago with chest pain and shortness of breath in addition to chills.  He also had some right groin swelling and was started on antibiotics.  An ultrasound was obtained which did not show evidence of a pseudoaneurysm.  There was a heterogeneity mass measuring 1.5 cm in diameter.  Patient was continued on vancomycin and Zosyn.  On my history the patient does note some right lower quadrant abdominal pain which she has had for a couple days but is slightly worse today.  He also admits to some fever and chills.  The patient is status post coronary artery bypass surgery.     Past Medical History:  Diagnosis Date  . Anginal pain (HCC)   . Coronary artery disease   . GERD (gastroesophageal reflux disease)   . History of acute pancreatitis 04/06/2015  . NSTEMI (non-ST elevated myocardial infarction) (HCC) 01/2014  . Pancreatitis   . PTSD (post-traumatic stress disorder)   . Tobacco abuse     Family History  Problem Relation Age of Onset  . Cancer Mother        Lung cancer  . Coronary artery disease Mother   . Heart failure Maternal Grandmother     SOCIAL HISTORY: Social History   Socioeconomic History  . Marital status: Divorced    Spouse name: Not on file  . Number of children: 2  . Years of education: Not on file  . Highest education level: Not on file  Occupational History  . Occupation: Statistician  Social Needs  . Financial resource strain: Not on file  . Food insecurity:    Worry: Not on file    Inability: Not on file  .  Transportation needs:    Medical: Not on file    Non-medical: Not on file  Tobacco Use  . Smoking status: Former Smoker    Packs/day: 1.00    Years: 31.00    Pack years: 31.00    Types: Cigarettes  . Smokeless tobacco: Never Used  Substance and Sexual Activity  . Alcohol use: Not Currently    Alcohol/week: 1.0 standard drinks    Types: 1 Cans of beer per week    Comment: occ  . Drug use: Not Currently    Types: Marijuana    Comment: denies 06/17/2015  . Sexual activity: Not Currently  Lifestyle  . Physical activity:    Days per week: Not on file    Minutes per session: Not on file  . Stress: Not on file  Relationships  . Social connections:    Talks on phone: Not on file    Gets together: Not on file    Attends religious service: Not on file    Active member of club or organization: Not on file    Attends meetings of clubs or organizations: Not on file    Relationship status: Not on file  . Intimate partner violence:    Fear of current or ex partner: Not on file    Emotionally abused: Not on file    Physically abused:  Not on file    Forced sexual activity: Not on file  Other Topics Concern  . Not on file  Social History Narrative   Divorced.  2 sons.  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    Allergies  Allergen Reactions  . Ibuprofen Nausea And Vomiting    UPSETS STOMACH  . Shellfish Allergy Nausea And Vomiting    Current Facility-Administered Medications  Medication Dose Route Frequency Provider Last Rate Last Dose  . acetaminophen (TYLENOL) tablet 650 mg  650 mg Oral Q6H PRN John Giovanni, MD      . aspirin EC tablet 81 mg  81 mg Oral Daily Marlin Canary U, DO   81 mg at 01/05/18 1338  . carvedilol (COREG) tablet 12.5 mg  12.5 mg Oral BID WC John Giovanni, MD   12.5 mg at 01/06/18 0759  . clopidogrel (PLAVIX) tablet 75 mg  75 mg Oral Daily John Giovanni, MD   75 mg at 01/05/18 0949  . enoxaparin (LOVENOX) injection  40 mg  40 mg Subcutaneous Daily John Giovanni, MD   40 mg at 01/05/18 1326  . famotidine (PEPCID) tablet 20 mg  20 mg Oral Daily PRN John Giovanni, MD   20 mg at 01/05/18 2133  . HYDROcodone-acetaminophen (NORCO/VICODIN) 5-325 MG per tablet 1-2 tablet  1-2 tablet Oral Q6H PRN John Giovanni, MD   2 tablet at 01/06/18 0759  . morphine 2 MG/ML injection 2 mg  2 mg Intravenous Q4H PRN Marlin Canary U, DO   2 mg at 01/06/18 0420  . nitroGLYCERIN (NITROSTAT) SL tablet 0.4 mg  0.4 mg Sublingual Q5 min PRN John Giovanni, MD      . ondansetron [facility]) injection 4 mg  4 mg Intravenous Q6H PRN John Giovanni, MD   4 mg at 01/05/18 2236  . piperacillin-tazobactam (ZOSYN) IVPB 3.375 g  3.375 g Intravenous Q8H Almon Hercules, RPH 12.5 mL/hr at 01/06/18 0418 3.375 g at 01/06/18 0418  . rosuvastatin (CRESTOR) tablet 40 mg  40 mg Oral q1800 John Giovanni, MD   40 mg at 01/05/18 1703  . vancomycin (VANCOCIN) IVPB 750 mg/150 ml premix  750 mg Intravenous Q12H Almon Hercules, RPH 150 mL/hr at 01/05/18 2221 750 mg at 01/05/18 2221    REVIEW OF SYSTEMS:  [X]  denotes positive finding, [ ]  denotes negative finding Cardiac  Comments:  Chest pain or chest pressure:    Shortness of breath upon exertion:    Short of breath when lying flat:    Irregular heart rhythm:        Vascular    Pain in calf, thigh, or hip brought on by ambulation:    Pain in feet at night that wakes you up from your sleep:     Blood clot in your veins:    Leg swelling:         Pulmonary    Oxygen at home:    Productive cough:     Wheezing:         Neurologic    Sudden weakness in arms or legs:     Sudden numbness in arms or legs:     Sudden onset of difficulty speaking or slurred speech:    Temporary loss of vision in one eye:     Problems with dizziness:         Gastrointestinal    Blood in stool:     Vomited blood:  Genitourinary    Burning when urinating:     Blood in urine:          Psychiatric    Major depression:         Hematologic    Bleeding problems:    Problems with blood clotting too easily:        Skin    Rashes or ulcers:        Constitutional    Fever or chills:     PHYSICAL EXAM:   Vitals:   01/05/18 1443 01/05/18 1958 01/06/18 0423 01/06/18 0809  BP: 98/61 112/73 140/83 119/79  Pulse: 70 63 70   Resp: 14  12 17   Temp: 97.6 F (36.4 C) 98 F (36.7 C) 97.8 F (36.6 C) 97.7 F (36.5 C)  TempSrc: Oral Oral Oral Oral  SpO2: 98% 96% 99%     GENERAL: The patient is a well-nourished male, in no acute distress. The vital signs are documented above. CARDIAC: There is a regular rate and rhythm.  VASCULAR: I do not detect carotid bruits. On the right side I am unable to assess his femoral pulse because he has right groin swelling.  He has palpable dorsalis pedis and posterior tibial pulse on the right. On the left side he has a palpable femoral, dorsalis pedis and posterior tibial pulse. He has no significant lower extremity swelling. PULMONARY: There is good air exchange bilaterally without wheezing or rales. ABDOMEN: Soft and non-tender with normal pitched bowel sounds.  MUSCULOSKELETAL: There are no major deformities or cyanosis. NEUROLOGIC: No focal weakness or paresthesias are detected. SKIN: There is a swelling in the right groin with thinning of the skin overlying this area.  There is erythema in this area which is reportedly improved. PSYCHIATRIC: The patient has a normal affect.  DATA:    DUPLEX: Duplex of the right groin shows no pseudoaneurysm.  There is edematous tissue surrounding a 1.5 cm x 1.5 cm mass which could potentially represent a hematoma or a lymph node.   ASSESSMENT & PLAN:   SWELLING RIGHT GROIN STATUS POST ARTERIOGRAPHY: The patient complains of abdominal pain also and given the swelling in the right groin with cellulitis and possible infection I have recommended a CT of the abdomen and pelvis.  Pending these results,  he may potentially need incision and drainage of this area as the skin overlying this area is thinned and appears to be infected.  He is on vancomycin and Zosyn.  I will make further recommendations pending the results of his CT angiogram.   Waverly Ferrari [facility] [phone]

## 2018-01-06 NOTE — Progress Notes (Signed)
Progress Note    Douglas [name]  RUE:[id number] DOB: 02-01-1966  DOA: 01/04/2018 PCP: Patient, No Pcp Per    Brief Narrative:    Douglas [name] is an 52 y.o. male with medical history significant of coronary artery disease status post CABG and stents, peripheral vascular disease, hyperlipidemia presenting to the hospital for evaluation of chest pain, shortness of breath, and chills.  Patient had a left iliac stent placed on October 22 by vascular surgery.  States since yesterday his right groin appears red swollen.  Area is painful.    He was admitted for further work-up  Assessment/Plan:   Principal Problem:   Cellulitis Active Problems:   CAD (coronary artery disease), native coronary artery   Chest pain   PVD (peripheral vascular disease) (HCC)   HTN (hypertension)   HLD (hyperlipidemia)   GERD (gastroesophageal reflux disease)   Sepsis (HCC)  Swelling at right groin status post radiograph he -s/p left external iliac stent placed on 10/22 and the access site was the right femoral artery - Associated with nausea and chills. -Doppler -right groin negative for pseudoaneurysm.  There is edematous tissue, focal area of heterogenicity approximately 1.5 x 1.5 cm (lymph node?)  Near area of redness and swelling. -Continue empirically with IV vancomycin and Zosyn, follow blood cultures, remains negative. -Vascular surgery has been consulted, given some abdominal pain today, and for CT abdomen pelvis, and further work-up and intervention per vascular surgery depends on imaging findings. -pain in uncontrolled- PO and IV for refractory pain  -Zofran PRN nausea -Blood culture x2 pending  Chest pain w/ h/o CAD s/p CABG and stents -CE negative -appreciate cardiology consult -CTA negative for PE -recent cath that showed re-stenosis and another stent was palced  Peripheral vascular disease -on ASA/plavix  Hypertension  -BP on lower side -resume home medications with  close management  Hyperlipidemia  -crestor  GERD -continue home meds   Family Communication/Anticipated D/C date and plan/Code Status   DVT prophylaxis: Lovenox ordered. Code Status: Full Code.  Family Communication: None at bedside Disposition Plan: Home when stable   Medical Consultants:    Cards  Vascular      Subjective:   He does report some right lower abdominal pain today  Objective:    Vitals:   01/05/18 1443 01/05/18 1958 01/06/18 0423 01/06/18 0809  BP: 98/61 112/73 140/83 119/79  Pulse: 70 63 70   Resp: 14  12 17   Temp: 97.6 F (36.4 C) 98 F (36.7 C) 97.8 F (36.6 C) 97.7 F (36.5 C)  TempSrc: Oral Oral Oral Oral  SpO2: 98% 96% 99%     Intake/Output Summary (Last 24 hours) at 01/06/2018 1101 Last data filed at 01/06/2018 0200 Gross per 24 hour  Intake 1552.5 ml  Output -  Net 1552.5 ml   There were no vitals filed for this visit.  Exam: Awake Alert, Oriented X 3, No new F.N deficits, Normal affect Symmetrical Chest wall movement, Good air movement bilaterally, CTAB RRR,No Gallops,Rubs or new Murmurs, No Parasternal Heave He does have some right lower quadrant tenderness, all sounds present, nondistended Right groin with some swelling and erythema   Data Reviewed:   I have personally reviewed following labs and imaging studies:  Labs: Labs show the following:   Basic Metabolic Panel: Recent Labs  Lab 12/31/17 0627 01/04/18 1642 01/06/18 0349  NA 141 136 137  K 3.4* 4.0 3.9  CL 109 105 107  CO2  --  21* 25  GLUCOSE 109* 85 93  BUN 3* 7 <5*  CREATININE 1.10 1.05 1.05  CALCIUM  --  8.5* 8.1*   GFR Estimated Creatinine Clearance: 80.1 mL/min (by C-G formula based on SCr of 1.05 mg/dL). Liver Function Tests: Recent Labs  Lab 01/04/18 1642  AST 24  ALT 14  ALKPHOS 96  BILITOT 0.2*  PROT 5.9*  ALBUMIN 3.3*   Recent Labs  Lab 01/04/18 1642  LIPASE 34   No results for input(s): AMMONIA in the last 168  hours. Coagulation profile No results for input(s): INR, PROTIME in the last 168 hours.  CBC: Recent Labs  Lab 12/31/17 0627 01/04/18 1642 01/06/18 0349  WBC  --  10.1 6.4  HGB 15.3 16.7 14.2  HCT 45.0 51.1 43.7  MCV  --  99.0 99.8  PLT  --  314 236   Cardiac Enzymes: Recent Labs  Lab 01/04/18 2202 01/05/18 0354 01/05/18 0935  TROPONINI <0.03 <0.03 <0.03   BNP (last 3 results) No results for input(s): PROBNP in the last 8760 hours. CBG: No results for input(s): GLUCAP in the last 168 hours. D-Dimer: No results for input(s): DDIMER in the last 72 hours. Hgb A1c: No results for input(s): HGBA1C in the last 72 hours. Lipid Profile: No results for input(s): CHOL, HDL, LDLCALC, TRIG, CHOLHDL, LDLDIRECT in the last 72 hours. Thyroid function studies: No results for input(s): TSH, T4TOTAL, T3FREE, THYROIDAB in the last 72 hours.  Invalid input(s): FREET3 Anemia work up: No results for input(s): VITAMINB12, FOLATE, FERRITIN, TIBC, IRON, RETICCTPCT in the last 72 hours. Sepsis Labs: Recent Labs  Lab 01/04/18 1642 01/04/18 1716 01/04/18 2129 01/06/18 0349  WBC 10.1  --   --  6.4  LATICACIDVEN  --  2.08* 0.48*  --     Microbiology Recent Results (from the past 240 hour(s))  Blood culture (routine x 2)     Status: None (Preliminary result)   Collection Time: 01/04/18  5:33 PM  Result Value Ref Range Status   Specimen Description BLOOD BLOOD RIGHT FOREARM  Final   Special Requests   Final    BOTTLES DRAWN AEROBIC AND ANAEROBIC Blood Culture adequate volume   Culture   Final    NO GROWTH < 24 HOURS Performed at [facility], [address] [place], [place] [address]    Report Status PENDING  Incomplete  Urine culture     Status: None   Collection Time: 01/04/18 10:35 PM  Result Value Ref Range Status   Specimen Description URINE, CLEAN CATCH  Final   Special Requests NONE  Final   Culture   Final    NO GROWTH Performed at [facility], [address] [place], [place] [address]    Report Status 01/06/2018 FINAL  Final    Procedures and diagnostic studies:  Ct Angio Chest Pe W And/or Wo Contrast  Result Date: 01/04/2018 CLINICAL DATA:  Chest pain EXAM: CT ANGIOGRAPHY CHEST WITH CONTRAST TECHNIQUE: Multidetector CT imaging of the chest was performed using the standard protocol during bolus administration of intravenous contrast. Multiplanar CT image reconstructions and MIPs were obtained to evaluate the vascular anatomy. CONTRAST:  62 mL ISOVUE-370 IOPAMIDOL (ISOVUE-370) INJECTION 76% COMPARISON:  Chest CT April 10, 2016; chest radiograph November 04, 2017 FINDINGS: Cardiovascular: There is no demonstrable pulmonary embolus. There is no thoracic aortic aneurysm or dissection. The visualized great vessels appear unremarkable except for slight calcification in the left subclavian artery. There is aortic atherosclerosis. There are foci  of native coronary artery calcification. The patient is status post coronary artery bypass grafting. There is no pericardial effusion or pericardial thickening. Mediastinum/Nodes: Visualized thyroid appears normal. There are subcentimeter mediastinal lymph nodes present. By size criteria, there is no evident thoracic adenopathy. There is a small hiatal hernia. Lungs/Pleura: There are scattered small bullae in the lungs bilaterally. There is bibasilar atelectasis. There is no edema or consolidation. There is a calcified granuloma in the posterior segment of the right upper lobe. No evident pleural thickening or pleural effusion. Upper Abdomen: Visualized adrenal show evidence of a degree of a hypertrophy, stable. Visualized upper abdominal structures otherwise appear unremarkable. Musculoskeletal: Patient is status post median sternotomy. There are no blastic or lytic bone lesions. No chest wall lesions are apparent. Review of the MIP images confirms the above findings. IMPRESSION: 1. No demonstrable pulmonary embolus. No  thoracic aortic aneurysm or dissection. Patient is status post coronary artery bypass grafting. There is aortic atherosclerosis. 2. Areas of scattered bullae as well as bibasilar atelectasis. Calcified granuloma right upper lobe. No edema or consolidation. 3.  No thoracic adenopathy by size criteria. 4.  Small hiatal hernia. 5.  Stable mild adrenal hypertrophy bilaterally. Aortic Atherosclerosis (ICD10-I70.0). Electronically Signed   By: Bretta Bang III M.D.   On: 01/04/2018 19:27    Medications:   . aspirin EC  81 mg Oral Daily  . carvedilol  12.5 mg Oral BID WC  . clopidogrel  75 mg Oral Daily  . enoxaparin (LOVENOX) injection  40 mg Subcutaneous Daily  . iopamidol  100 mL Intravenous Once  . iopamidol      . rosuvastatin  40 mg Oral q1800   Continuous Infusions: . piperacillin-tazobactam (ZOSYN)  IV 3.375 g (01/06/18 0418)  . vancomycin 750 mg (01/05/18 2221)     LOS: 1 day   Huey Bienenstock MD Pager [phone] [facility]   *Please refer to [email or web address], password [email or web address] to get updated schedule on who will round on this patient, as hospitalists switch teams weekly. If 7PM-7AM, please contact night-coverage at [email or web address], password [email or web address] for any overnight needs.  01/06/2018, 11:01 AM

## 2018-01-06 NOTE — H&P (View-Only) (Signed)
 REASON FOR CONSULT:    Swelling right groin with possible infection.  Consult is requested by Dr. Elgergawy.  HPI:   Douglas [name] is a pleasant 52 y.o. male, who had presented with disabling left lower extremity claudication.  He underwent an arteriogram by Dr. Brabham on 12/31/2017.  There was a right femoral approach.  A left external iliac artery angioplasty and stenting was performed with an excellent result.  The patient was admitted 2 days ago with chest pain and shortness of breath in addition to chills.  He also had some right groin swelling and was started on antibiotics.  An ultrasound was obtained which did not show evidence of a pseudoaneurysm.  There was a heterogeneity mass measuring 1.5 cm in diameter.  Patient was continued on vancomycin and Zosyn.  On my history the patient does note some right lower quadrant abdominal pain which she has had for a couple days but is slightly worse today.  He also admits to some fever and chills.  The patient is status post coronary artery bypass surgery.     Past Medical History:  Diagnosis Date  . Anginal pain (HCC)   . Coronary artery disease   . GERD (gastroesophageal reflux disease)   . History of acute pancreatitis 04/06/2015  . NSTEMI (non-ST elevated myocardial infarction) (HCC) 01/2014  . Pancreatitis   . PTSD (post-traumatic stress disorder)   . Tobacco abuse     Family History  Problem Relation Age of Onset  . Cancer Mother        Lung cancer  . Coronary artery disease Mother   . Heart failure Maternal Grandmother     SOCIAL HISTORY: Social History   Socioeconomic History  . Marital status: Divorced    Spouse name: Not on file  . Number of children: 2  . Years of education: Not on file  . Highest education level: Not on file  Occupational History  . Occupation: [employer]  Social Needs  . Financial resource strain: Not on file  . Food insecurity:    Worry: Not on file    Inability: Not on file  .  Transportation needs:    Medical: Not on file    Non-medical: Not on file  Tobacco Use  . Smoking status: Former Smoker    Packs/day: 1.00    Years: 31.00    Pack years: 31.00    Types: Cigarettes  . Smokeless tobacco: Never Used  Substance and Sexual Activity  . Alcohol use: Not Currently    Alcohol/week: 1.0 standard drinks    Types: 1 Cans of beer per week    Comment: occ  . Drug use: Not Currently    Types: Marijuana    Comment: denies 06/17/2015  . Sexual activity: Not Currently  Lifestyle  . Physical activity:    Days per week: Not on file    Minutes per session: Not on file  . Stress: Not on file  Relationships  . Social connections:    Talks on phone: Not on file    Gets together: Not on file    Attends religious service: Not on file    Active member of club or organization: Not on file    Attends meetings of clubs or organizations: Not on file    Relationship status: Not on file  . Intimate partner violence:    Fear of current or ex partner: Not on file    Emotionally abused: Not on file    Physically abused:   Not on file    Forced sexual activity: Not on file  Other Topics Concern  . Not on file  Social History Narrative   Divorced.  2 sons.  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    Allergies  Allergen Reactions  . Ibuprofen Nausea And Vomiting    UPSETS STOMACH  . Shellfish Allergy Nausea And Vomiting    Current Facility-Administered Medications  Medication Dose Route Frequency Provider Last Rate Last Dose  . acetaminophen (TYLENOL) tablet 650 mg  650 mg Oral Q6H PRN Rathore, Vasundhra, MD      . aspirin EC tablet 81 mg  81 mg Oral Daily Vann, Jessica U, DO   81 mg at 01/05/18 1338  . carvedilol (COREG) tablet 12.5 mg  12.5 mg Oral BID WC Rathore, Vasundhra, MD   12.5 mg at 01/06/18 0759  . clopidogrel (PLAVIX) tablet 75 mg  75 mg Oral Daily Rathore, Vasundhra, MD   75 mg at 01/05/18 0949  . enoxaparin (LOVENOX) injection  40 mg  40 mg Subcutaneous Daily Rathore, Vasundhra, MD   40 mg at 01/05/18 1326  . famotidine (PEPCID) tablet 20 mg  20 mg Oral Daily PRN Rathore, Vasundhra, MD   20 mg at 01/05/18 2133  . HYDROcodone-acetaminophen (NORCO/VICODIN) 5-325 MG per tablet 1-2 tablet  1-2 tablet Oral Q6H PRN Rathore, Vasundhra, MD   2 tablet at 01/06/18 0759  . morphine 2 MG/ML injection 2 mg  2 mg Intravenous Q4H PRN Vann, Jessica U, DO   2 mg at 01/06/18 0420  . nitroGLYCERIN (NITROSTAT) SL tablet 0.4 mg  0.4 mg Sublingual Q5 min PRN Rathore, Vasundhra, MD      . ondansetron (ZOFRAN) injection 4 mg  4 mg Intravenous Q6H PRN Rathore, Vasundhra, MD   4 mg at 01/05/18 2236  . piperacillin-tazobactam (ZOSYN) IVPB 3.375 g  3.375 g Intravenous Q8H Baird, Haley P, RPH 12.5 mL/hr at 01/06/18 0418 3.375 g at 01/06/18 0418  . rosuvastatin (CRESTOR) tablet 40 mg  40 mg Oral q1800 Rathore, Vasundhra, MD   40 mg at 01/05/18 1703  . vancomycin (VANCOCIN) IVPB 750 mg/150 ml premix  750 mg Intravenous Q12H Baird, Haley P, RPH 150 mL/hr at 01/05/18 2221 750 mg at 01/05/18 2221    REVIEW OF SYSTEMS:  [X] denotes positive finding, [ ] denotes negative finding Cardiac  Comments:  Chest pain or chest pressure:    Shortness of breath upon exertion:    Short of breath when lying flat:    Irregular heart rhythm:        Vascular    Pain in calf, thigh, or hip brought on by ambulation:    Pain in feet at night that wakes you up from your sleep:     Blood clot in your veins:    Leg swelling:         Pulmonary    Oxygen at home:    Productive cough:     Wheezing:         Neurologic    Sudden weakness in arms or legs:     Sudden numbness in arms or legs:     Sudden onset of difficulty speaking or slurred speech:    Temporary loss of vision in one eye:     Problems with dizziness:         Gastrointestinal    Blood in stool:     Vomited blood:           Genitourinary    Burning when urinating:     Blood in urine:          Psychiatric    Major depression:         Hematologic    Bleeding problems:    Problems with blood clotting too easily:        Skin    Rashes or ulcers:        Constitutional    Fever or chills:     PHYSICAL EXAM:   Vitals:   01/05/18 1443 01/05/18 1958 01/06/18 0423 01/06/18 0809  BP: 98/61 112/73 140/83 119/79  Pulse: 70 63 70   Resp: 14  12 17  Temp: 97.6 F (36.4 C) 98 F (36.7 C) 97.8 F (36.6 C) 97.7 F (36.5 C)  TempSrc: Oral Oral Oral Oral  SpO2: 98% 96% 99%     GENERAL: The patient is a well-nourished male, in no acute distress. The vital signs are documented above. CARDIAC: There is a regular rate and rhythm.  VASCULAR: I do not detect carotid bruits. On the right side I am unable to assess his femoral pulse because he has right groin swelling.  He has palpable dorsalis pedis and posterior tibial pulse on the right. On the left side he has a palpable femoral, dorsalis pedis and posterior tibial pulse. He has no significant lower extremity swelling. PULMONARY: There is good air exchange bilaterally without wheezing or rales. ABDOMEN: Soft and non-tender with normal pitched bowel sounds.  MUSCULOSKELETAL: There are no major deformities or cyanosis. NEUROLOGIC: No focal weakness or paresthesias are detected. SKIN: There is a swelling in the right groin with thinning of the skin overlying this area.  There is erythema in this area which is reportedly improved. PSYCHIATRIC: The patient has a normal affect.  DATA:    DUPLEX: Duplex of the right groin shows no pseudoaneurysm.  There is edematous tissue surrounding a 1.5 cm x 1.5 cm mass which could potentially represent a hematoma or a lymph node.   ASSESSMENT & PLAN:   SWELLING RIGHT GROIN STATUS POST ARTERIOGRAPHY: The patient complains of abdominal pain also and given the swelling in the right groin with cellulitis and possible infection I have recommended a CT of the abdomen and pelvis.  Pending these results,  he may potentially need incision and drainage of this area as the skin overlying this area is thinned and appears to be infected.  He is on vancomycin and Zosyn.  I will make further recommendations pending the results of his CT angiogram.   Christopher Dickson [facility] [phone]  

## 2018-01-06 NOTE — Progress Notes (Addendum)
Progress Note  Patient Name: Douglas [name] Date of Encounter: 01/06/2018  Primary Cardiologist: Previously Dr. Donnie Aho   Subjective   Groin is still sore. Reports no change in intermittent chest discomfort that comes on/off (worse with specific movements) that's been present ever since CABG - it is a sharp stab position. Also can feel it after-the-fact if he does a lot of activity. It is not specifically worse with exertion or inspiration.  Inpatient Medications    Scheduled Meds: . aspirin EC  81 mg Oral Daily  . carvedilol  12.5 mg Oral BID WC  . clopidogrel  75 mg Oral Daily  . enoxaparin (LOVENOX) injection  40 mg Subcutaneous Daily  . iopamidol      . rosuvastatin  40 mg Oral q1800   Continuous Infusions: . sodium chloride 75 mL/hr at 01/06/18 1145  . piperacillin-tazobactam (ZOSYN)  IV 3.375 g (01/06/18 0418)  . vancomycin 750 mg (01/06/18 1300)   PRN Meds: acetaminophen, famotidine, HYDROcodone-acetaminophen, morphine injection, nitroGLYCERIN, ondansetron (ZOFRAN) IV   Vital Signs    Vitals:   01/05/18 1958 01/06/18 0423 01/06/18 0809 01/06/18 1141  BP: 112/73 140/83 119/79 99/82  Pulse: 63 70  (!) 59  Resp:  12 17 10   Temp: 98 F (36.7 C) 97.8 F (36.6 C) 97.7 F (36.5 C) 97.6 F (36.4 C)  TempSrc: Oral Oral Oral Other (Comment)  SpO2: 96% 99%  97%    Intake/Output Summary (Last 24 hours) at 01/06/2018 1335 Last data filed at 01/06/2018 0200 Gross per 24 hour  Intake 1312.5 ml  Output -  Net 1312.5 ml   There were no vitals filed for this visit.  Telemetry    NSR, brief SB 48 while sleeping - Personally Reviewed  Physical Exam   GEN: No acute distress. Lying flat in bed. HEENT: Normocephalic, atraumatic, sclera non-icteric. Neck: No JVD or bruits. Cardiac: RRR no murmurs, rubs, or gallops.  Radials/DP/PT 1+ and equal bilaterally.  Respiratory: Clear to auscultation bilaterally. Breathing is unlabored. GI: Soft, nontender,  non-distended, BS +x 4. MS: no deformity. Extremities: No clubbing or cyanosis. No edema. Distal pedal pulses are 2+ and equal bilaterally. R groin with protrusion that is erythematous and bruised, mild ecchymosis surrounding. Neuro:  AAOx3. Follows commands. Psych:  Responds to questions appropriately with a normal affect.  Labs    Chemistry Recent Labs  Lab 12/31/17 0627 01/04/18 1642 01/06/18 0349  NA 141 136 137  K 3.4* 4.0 3.9  CL 109 105 107  CO2  --  21* 25  GLUCOSE 109* 85 93  BUN 3* 7 <5*  CREATININE 1.10 1.05 1.05  CALCIUM  --  8.5* 8.1*  PROT  --  5.9*  --   ALBUMIN  --  3.3*  --   AST  --  24  --   ALT  --  14  --   ALKPHOS  --  96  --   BILITOT  --  0.2*  --   GFRNONAA  --  >60 >60  GFRAA  --  >60 >60  ANIONGAP  --  10 5     Hematology Recent Labs  Lab 12/31/17 0627 01/04/18 1642 01/06/18 0349  WBC  --  10.1 6.4  RBC  --  5.16 4.38  HGB 15.3 16.7 14.2  HCT 45.0 51.1 43.7  MCV  --  99.0 99.8  MCH  --  32.4 32.4  MCHC  --  32.7 32.5  RDW  --  13.2 12.9  PLT  --  314 236    Cardiac Enzymes Recent Labs  Lab 01/04/18 2202 01/05/18 0354 01/05/18 0935  TROPONINI <0.03 <0.03 <0.03    Recent Labs  Lab 01/04/18 1714  TROPIPOC 0.01     BNPNo results for input(s): BNP, PROBNP in the last 168 hours.   DDimer No results for input(s): DDIMER in the last 168 hours.   Radiology    Ct Angio Chest Pe W And/or Wo Contrast  Result Date: 01/04/2018 CLINICAL DATA:  Chest pain EXAM: CT ANGIOGRAPHY CHEST WITH CONTRAST TECHNIQUE: Multidetector CT imaging of the chest was performed using the standard protocol during bolus administration of intravenous contrast. Multiplanar CT image reconstructions and MIPs were obtained to evaluate the vascular anatomy. CONTRAST:  62 mL ISOVUE-370 IOPAMIDOL (ISOVUE-370) INJECTION 76% COMPARISON:  Chest CT April 10, 2016; chest radiograph November 04, 2017 FINDINGS: Cardiovascular: There is no demonstrable pulmonary embolus.  There is no thoracic aortic aneurysm or dissection. The visualized great vessels appear unremarkable except for slight calcification in the left subclavian artery. There is aortic atherosclerosis. There are foci of native coronary artery calcification. The patient is status post coronary artery bypass grafting. There is no pericardial effusion or pericardial thickening. Mediastinum/Nodes: Visualized thyroid appears normal. There are subcentimeter mediastinal lymph nodes present. By size criteria, there is no evident thoracic adenopathy. There is a small hiatal hernia. Lungs/Pleura: There are scattered small bullae in the lungs bilaterally. There is bibasilar atelectasis. There is no edema or consolidation. There is a calcified granuloma in the posterior segment of the right upper lobe. No evident pleural thickening or pleural effusion. Upper Abdomen: Visualized adrenal show evidence of a degree of a hypertrophy, stable. Visualized upper abdominal structures otherwise appear unremarkable. Musculoskeletal: Patient is status post median sternotomy. There are no blastic or lytic bone lesions. No chest wall lesions are apparent. Review of the MIP images confirms the above findings. IMPRESSION: 1. No demonstrable pulmonary embolus. No thoracic aortic aneurysm or dissection. Patient is status post coronary artery bypass grafting. There is aortic atherosclerosis. 2. Areas of scattered bullae as well as bibasilar atelectasis. Calcified granuloma right upper lobe. No edema or consolidation. 3.  No thoracic adenopathy by size criteria. 4.  Small hiatal hernia. 5.  Stable mild adrenal hypertrophy bilaterally. Aortic Atherosclerosis (ICD10-I70.0). Electronically Signed   By: Bretta Bang III M.D.   On: 01/04/2018 19:27   Ct Angio Abd/pel W/ And/or W/o  Result Date: 01/06/2018 CLINICAL DATA:  52 year old male with right groin swelling status post cardiac catheterization last week. Evaluate for pseudoaneurysm. EXAM: CT  ANGIOGRAPHY ABDOMEN AND PELVIS WITH CONTRAST AND WITHOUT CONTRAST TECHNIQUE: Multidetector CT imaging of the abdomen and pelvis was performed using the standard protocol during bolus administration of intravenous contrast. Multiplanar reconstructed images and MIPs were obtained and reviewed to evaluate the vascular anatomy. CONTRAST:  100 mL Isovue 370 COMPARISON:  Prior CT scan of the abdomen and pelvis 04/10/2017 FINDINGS: VASCULAR Aorta: Moderate heterogeneous atherosclerotic plaque without evidence of aneurysm, dissection or penetrating ulceration. 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 No aneurysm or dissection. SMA: Mild fibrofatty atherosclerotic plaque in the proximal SMA resulting in perhaps mild stenosis. Renals: Single renal arteries bilaterally. No evidence of aneurysm, dissection or fibromuscular dysplasia. No significant stenosis on the right. Mild stenosis secondary to mixed atherosclerotic plaque on the left. IMA: High-grade stenosis at the origin of the inferior mesenteric artery. Inflow:  Heterogeneous atherosclerotic plaque throughout both common iliac arteries without significant stenosis. The origins of the internal iliac arteries are stenosed bilaterally. There is a focal moderate stenosis in the proximal right external iliac artery. Left external iliac artery stent is widely patent. Proximal Outflow: Heterogeneous atherosclerotic plaque is present along the posterior wall of the common femoral arteries bilaterally. Slightly high bifurcation on the left. No evidence of pseudoaneurysm. The visualized portions of the bilateral profunda and superficial femoral arteries are patent. Veins: No focal venous abnormality. Review of the MIP images confirms the above findings. NON-VASCULAR Lower chest: Calcifications visualized in the coronary arteries. The heart is normal in size.  No pericardial effusion. The lungs are clear. Minimal dependent atelectasis. Surgical changes of prior median sternotomy incompletely imaged. Hepatobiliary: Normal hepatic contour and morphology. No discrete hepatic lesions. Normal appearance of the gallbladder. No intra or extrahepatic biliary ductal dilatation. Pancreas: Unremarkable. No pancreatic ductal dilatation or surrounding inflammatory changes. Spleen: Normal in size without focal abnormality. Adrenals/Urinary Tract: Adrenal glands are unremarkable. Kidneys are normal, without renal calculi, focal lesion, or hydronephrosis. Bladder is unremarkable. Stomach/Bowel: No evidence of obstruction or focal bowel wall thickening. Normal appendix in the right lower quadrant. The terminal ileum is unremarkable. Lymphatic: No suspicious lymphadenopathy. Reproductive: Prostate is unremarkable. Other: Small fat containing umbilical hernia. Trace free fluid present in the anatomic pelvis which is abnormal but nonspecific. Heterogeneous region of intermediate attenuation in the superficial subcutaneous fat anterior to the right common femoral artery and vein likely representing hematoma. There is slight thickening of the overlying skin. Musculoskeletal: No acute fracture or aggressive appearing lytic or blastic osseous lesion. Chronic left-sided pars defect at L5. IMPRESSION: VASCULAR 1. Heterogeneous and amorphous intermediate density in the superficial subcutaneous fat overlying the right common femoral artery with thickening of the adjacent skin. Given history of recent groin puncture, this almost certainly represents a small amount of hematoma. In the appropriate clinical setting, cellulitis with phlegmon could appear similar. There is no evidence of arterial pseudoaneurysm or defined abscess. 2. Extensive heterogeneous atherosclerotic plaque throughout the aorta and branch arteries resulting in a moderate focal stenosis of the origin of the celiac axis and a  high-grade stenosis of the origin of the inferior mesenteric artery. 3. Moderate focal stenosis of the proximal right external iliac artery. 4. Widely patent left external iliac artery stent. 5. Coronary artery calcifications. NON-VASCULAR 1. Small volume free fluid in the anatomic pelvis is abnormal in a male but nonspecific. This can occasionally be seen in the setting of an infectious or inflammatory process involving the colon or small bowel which is otherwise occult by imaging. 2. Small fat containing umbilical hernia. 3. Chronic left L5 pars defect. Aortic Atherosclerosis (ICD10-170.0) Signed, Sterling Big, MD, RPVI [facility] Electronically Signed   By: Malachy Moan M.D.   On: 01/06/2018 11:39   Patient Profile     52 y.o. male with prior alcohol abuse with recurrent pancreatitis, PTSD, prior intermittent homelessness, hypertension, hyperlipidemia, smoking, CAD (PCI to RCA 2015, CABG in 2018, POBA to RCA 03/2017, DES to [facility] in August 2019), bilateral lower extremity PAD s/p left external iliac artery stenting 12/31/17 who presented 10/27 with right groin pain, subjective fevers, chills, chest pain. Ultrasound showed no pseudoaneurysm but there was a heterogeneity mass measuring 1.5 cm in diameter. He was started on antibiotics  Assessment & Plan    1. Groin pain with cellulitis and RLQ pain, recent iliac stenting - evaluated by vascular, pending  review of CT to decide need for I/D.  2. CAD s/p CABG, PCI with chest pain - troponins negative and CTA neg for PE, dissection or acute pulmonary process. Did show scattered bullae, bibasilar atx, calcified granuloma RUL which can be monitored as OP. EKG nonacute. Remains on ASA, BB, Plavix, statin. Consider decreasing carvedilol if softer BP becomes problematic. Chest pain does not sound anginal in nature - has been present ever since CABG, worse with certain movements. If he does a lot  of activity he can feel it later in the day. Does not worsen with exertion and never affected by PCI. May be neuropathic or MSK.  3. Essential HTN - BP soft at times in setting of infection. Amlodipine on hold. He was switched from home metoprolol to carvedilol but may need to back off dose if BPs prohibitive.  4. Hyperlipidemia - last LDL in 03/2016 was 89. Do not see statin on home regimen but he is on Crestor here.  5. Prior homelessness - no longer active issue per patient.  For questions or updates, please contact [facility] Please consult [email or web address] for contact info under Cardiology/STEMI.  Signed, Laurann Montana, PA-C 01/06/2018, 1:35 PM   ---------------------------------------------------------------------------------------------   History and all data above reviewed.  Patient examined.  I agree with the findings as above.  BAKER MORONTA continues to have positional and chest wall pain. He has had an echocardiogram recently that did not show signifcant evidence of constrictive physiology from chronic pericarditis. Therefore, this likely is more representative of chest wall pain/neuropathic pain due to previous sternotomy.  Constitutional: No acute distress Eyes: pupils equally round and reactive to light, sclera non-icteric, normal conjunctiva and lids ENMT: normal dentition, moist mucous membranes Cardiovascular: regular rhythm, normal rate, no murmurs. S1 and S2 normal. Radial pulses normal bilaterally. No jugular venous distention.  Respiratory: clear to auscultation bilaterally GI : normal bowel sounds, soft and nontender. No distention.   MSK: extremities warm, well perfused. No edema.  LYMPH: No lymphadenopathy noted of the head and neck NEURO: grossly nonfocal exam, moves all extremities. PSYCH: alert and oriented x 3, normal mood and affect.   All available labs, radiology testing, previous records reviewed. Agree with documented assessment and plan of my  colleague as stated above with the following additions or changes:  Principal Problem:   Cellulitis Active Problems:   CAD (coronary artery disease), native coronary artery   Chest pain   PVD (peripheral vascular disease) (HCC)   HTN (hypertension)   HLD (hyperlipidemia)   GERD (gastroesophageal reflux disease)   Sepsis (HCC)   Plan: No further cardiovascular testing required at this time. He should follow up in [facility], he will call and make an appointment as he is a patient of Dr. Donnie Aho.   [facility] will sign off.   Medication Recommendations: no change Other recommendations (labs, testing, etc):  n/a Follow up as an outpatient:  F/u in cardiology clinic in 3 months   Parke Poisson, MD [facility] 12:38 PM  01/07/2018

## 2018-01-07 ENCOUNTER — Encounter (HOSPITAL_COMMUNITY)
Admission: EM | Disposition: A | Discharge status: Home / Self Care | Payer: Self-pay | Source: Home / Self Care | Attending: Internal Medicine

## 2018-01-07 ENCOUNTER — Encounter (HOSPITAL_COMMUNITY): Payer: Self-pay

## 2018-01-07 ENCOUNTER — Inpatient Hospital Stay (HOSPITAL_COMMUNITY): Payer: Self-pay

## 2018-01-07 HISTORY — PX: GROIN DEBRIDEMENT: SHX5159

## 2018-01-07 LAB — CBC
HCT: 42.1 % (ref 39.0–52.0)
HEMOGLOBIN: 13.5 g/dL (ref 13.0–17.0)
MCH: 32 pg (ref 26.0–34.0)
MCHC: 32.1 g/dL (ref 30.0–36.0)
MCV: 99.8 fL (ref 80.0–100.0)
NRBC: 0 % (ref 0.0–0.2)
Platelets: 244 10*3/uL (ref 150–400)
RBC: 4.22 MIL/uL (ref 4.22–5.81)
RDW: 12.8 % (ref 11.5–15.5)
WBC: 5.7 10*3/uL (ref 4.0–10.5)

## 2018-01-07 LAB — BASIC METABOLIC PANEL
ANION GAP: 8 (ref 5–15)
BUN: <5 mg/dL — ABNORMAL LOW (ref 6–20)
CALCIUM: 7.8 mg/dL — AB (ref 8.9–10.3)
CHLORIDE: 108 mmol/L (ref 98–111)
CO2: 21 mmol/L — ABNORMAL LOW (ref 22–32)
CREATININE: 1.04 mg/dL (ref 0.61–1.24)
GFR calc Af Amer: >60 mL/min (ref >60)
GFR calc non Af Amer: >60 mL/min (ref >60)
Glucose, Bld: 115 mg/dL — ABNORMAL HIGH (ref 70–99)
Potassium: 3.9 mmol/L (ref 3.5–5.1)
SODIUM: 137 mmol/L (ref 135–145)

## 2018-01-07 LAB — VANCOMYCIN, TROUGH: Vancomycin Tr: 11 ug/mL — ABNORMAL LOW (ref 15–20)

## 2018-01-07 LAB — SURGICAL PCR SCREEN
MRSA, PCR: NEGATIVE
STAPHYLOCOCCUS AUREUS: POSITIVE — AB

## 2018-01-07 SURGERY — DEBRIDEMENT, INGUINAL REGION
Anesthesia: General | Site: Groin | Laterality: Right

## 2018-01-07 MED ORDER — LIDOCAINE 2% (20 MG/ML) 5 ML SYRINGE
INTRAMUSCULAR | Status: DC | PRN
  Administered 2018-01-07: 100 mg via INTRAVENOUS

## 2018-01-07 MED ORDER — OXYCODONE HCL 5 MG PO TABS
5.0000 mg | ORAL_TABLET | Freq: Once | ORAL | Status: AC | PRN
  Administered 2018-01-07: 5 mg via ORAL

## 2018-01-07 MED ORDER — PHENYLEPHRINE 40 MCG/ML (10ML) SYRINGE FOR IV PUSH (FOR BLOOD PRESSURE SUPPORT)
PREFILLED_SYRINGE | INTRAVENOUS | Status: AC
  Filled 2018-01-07: qty 40

## 2018-01-07 MED ORDER — ONDANSETRON HCL 4 MG/2ML IJ SOLN: 4.0000 mg | Freq: Once | INTRAMUSCULAR | Status: DC | PRN

## 2018-01-07 MED ORDER — ONDANSETRON HCL 4 MG/2ML IJ SOLN
INTRAMUSCULAR | Status: DC | PRN
  Administered 2018-01-07: 4 mg via INTRAVENOUS

## 2018-01-07 MED ORDER — PROPOFOL 10 MG/ML IV BOLUS
INTRAVENOUS | Status: AC
  Filled 2018-01-07: qty 20

## 2018-01-07 MED ORDER — FENTANYL CITRATE (PF) 100 MCG/2ML IJ SOLN
INTRAMUSCULAR | Status: AC
  Filled 2018-01-07: qty 2

## 2018-01-07 MED ORDER — OXYCODONE HCL 5 MG/5ML PO SOLN: 5.0000 mg | Freq: Once | ORAL | Status: AC | PRN

## 2018-01-07 MED ORDER — CEFAZOLIN SODIUM 1 G IJ SOLR
INTRAMUSCULAR | Status: AC
  Filled 2018-01-07: qty 20

## 2018-01-07 MED ORDER — HEPARIN SODIUM (PORCINE) 1000 UNIT/ML IJ SOLN
INTRAMUSCULAR | Status: AC
  Filled 2018-01-07: qty 1

## 2018-01-07 MED ORDER — ENOXAPARIN SODIUM 40 MG/0.4ML ~~LOC~~ SOLN
40.0000 mg | Freq: Every day | SUBCUTANEOUS | Status: DC
  Administered 2018-01-08 – 2018-01-09 (×2): 40 mg via SUBCUTANEOUS
  Filled 2018-01-07 (×3): qty 0.4

## 2018-01-07 MED ORDER — SODIUM CHLORIDE 0.9 % IV SOLN
INTRAVENOUS | Status: AC
  Filled 2018-01-07: qty 1.2

## 2018-01-07 MED ORDER — MIDAZOLAM HCL 2 MG/2ML IJ SOLN
INTRAMUSCULAR | Status: DC | PRN
  Administered 2018-01-07: 2 mg via INTRAVENOUS

## 2018-01-07 MED ORDER — CHLORHEXIDINE GLUCONATE CLOTH 2 % EX PADS
6.0000 | MEDICATED_PAD | Freq: Every day | CUTANEOUS | Status: DC
  Administered 2018-01-07: 6 via TOPICAL

## 2018-01-07 MED ORDER — ONDANSETRON HCL 4 MG/2ML IJ SOLN
INTRAMUSCULAR | Status: AC
  Filled 2018-01-07: qty 2

## 2018-01-07 MED ORDER — MUPIROCIN 2 % EX OINT
1.0000 "application " | TOPICAL_OINTMENT | Freq: Two times a day (BID) | CUTANEOUS | Status: DC
  Administered 2018-01-07 – 2018-01-10 (×7): 1 via NASAL
  Filled 2018-01-07 (×2): qty 22

## 2018-01-07 MED ORDER — PHENYLEPHRINE HCL 10 MG/ML IJ SOLN
INTRAMUSCULAR | Status: DC | PRN
  Administered 2018-01-07 (×3): 80 ug via INTRAVENOUS

## 2018-01-07 MED ORDER — PROTAMINE SULFATE 10 MG/ML IV SOLN
INTRAVENOUS | Status: AC
  Filled 2018-01-07: qty 5

## 2018-01-07 MED ORDER — ESMOLOL HCL 100 MG/10ML IV SOLN
INTRAVENOUS | Status: AC
  Filled 2018-01-07: qty 20

## 2018-01-07 MED ORDER — DEXAMETHASONE SODIUM PHOSPHATE 10 MG/ML IJ SOLN
INTRAMUSCULAR | Status: AC
  Filled 2018-01-07: qty 1

## 2018-01-07 MED ORDER — EPHEDRINE SULFATE 50 MG/ML IJ SOLN
INTRAMUSCULAR | Status: DC | PRN
  Administered 2018-01-07 (×4): 10 mg via INTRAVENOUS

## 2018-01-07 MED ORDER — MIDAZOLAM HCL 2 MG/2ML IJ SOLN
INTRAMUSCULAR | Status: AC
  Filled 2018-01-07: qty 2

## 2018-01-07 MED ORDER — PROPOFOL 10 MG/ML IV BOLUS
INTRAVENOUS | Status: DC | PRN
  Administered 2018-01-07: 20 mg via INTRAVENOUS
  Administered 2018-01-07: 100 mg via INTRAVENOUS

## 2018-01-07 MED ORDER — 0.9 % SODIUM CHLORIDE (POUR BTL) OPTIME
TOPICAL | Status: DC | PRN
  Administered 2018-01-07: 1000 mL

## 2018-01-07 MED ORDER — OXYCODONE HCL 5 MG PO TABS
ORAL_TABLET | ORAL | Status: AC
  Filled 2018-01-07: qty 1

## 2018-01-07 MED ORDER — PHENYLEPHRINE HCL 10 MG/ML IJ SOLN
INTRAMUSCULAR | Status: AC
  Filled 2018-01-07: qty 1

## 2018-01-07 MED ORDER — DEXAMETHASONE SODIUM PHOSPHATE 10 MG/ML IJ SOLN
INTRAMUSCULAR | Status: DC | PRN
  Administered 2018-01-07: 10 mg via INTRAVENOUS

## 2018-01-07 MED ORDER — FENTANYL CITRATE (PF) 250 MCG/5ML IJ SOLN
INTRAMUSCULAR | Status: DC | PRN
  Administered 2018-01-07 (×2): 25 ug via INTRAVENOUS

## 2018-01-07 MED ORDER — LACTATED RINGERS IV SOLN
INTRAVENOUS | Status: DC
  Administered 2018-01-07 (×2): via INTRAVENOUS

## 2018-01-07 MED ORDER — EPHEDRINE 5 MG/ML INJ
INTRAVENOUS | Status: AC
  Filled 2018-01-07: qty 10

## 2018-01-07 MED ORDER — FENTANYL CITRATE (PF) 250 MCG/5ML IJ SOLN
INTRAMUSCULAR | Status: AC
  Filled 2018-01-07: qty 5

## 2018-01-07 MED ORDER — LIDOCAINE 2% (20 MG/ML) 5 ML SYRINGE
INTRAMUSCULAR | Status: AC
  Filled 2018-01-07: qty 5

## 2018-01-07 MED ORDER — FENTANYL CITRATE (PF) 100 MCG/2ML IJ SOLN
25.0000 ug | INTRAMUSCULAR | Status: DC | PRN
  Administered 2018-01-07 (×3): 50 ug via INTRAVENOUS

## 2018-01-07 SURGICAL SUPPLY — 33 items
ADH SKN CLS APL DERMABOND .7 (GAUZE/BANDAGES/DRESSINGS) ×1
BNDG GAUZE ELAST 4 BULKY (GAUZE/BANDAGES/DRESSINGS) IMPLANT
BOOT SUTURE AID YELLOW STND (SUTURE) ×2 IMPLANT
CANISTER SUCT 3000ML PPV (MISCELLANEOUS) ×3 IMPLANT
COVER SURGICAL LIGHT HANDLE (MISCELLANEOUS) ×2 IMPLANT
COVER WAND RF STERILE (DRAPES) ×3 IMPLANT
DERMABOND ADVANCED (GAUZE/BANDAGES/DRESSINGS) ×2
DERMABOND ADVANCED .7 DNX12 (GAUZE/BANDAGES/DRESSINGS) IMPLANT
ELECT REM PT RETURN 9FT ADLT (ELECTROSURGICAL) ×3
ELECTRODE REM PT RTRN 9FT ADLT (ELECTROSURGICAL) ×1 IMPLANT
GAUZE SPONGE 4X4 12PLY STRL (GAUZE/BANDAGES/DRESSINGS) ×3 IMPLANT
GLOVE BIO SURGEON STRL SZ7.5 (GLOVE) ×3 IMPLANT
GLOVE BIOGEL PI IND STRL 8 (GLOVE) ×1 IMPLANT
GLOVE BIOGEL PI INDICATOR 8 (GLOVE) ×2
GOWN STRL REUS W/ TWL LRG LVL3 (GOWN DISPOSABLE) ×3 IMPLANT
GOWN STRL REUS W/TWL LRG LVL3 (GOWN DISPOSABLE) ×9
KIT BASIN OR (CUSTOM PROCEDURE TRAY) ×3 IMPLANT
KIT TURNOVER KIT B (KITS) ×3 IMPLANT
NS IRRIG 1000ML POUR BTL (IV SOLUTION) ×3 IMPLANT
PACK GENERAL/GYN (CUSTOM PROCEDURE TRAY) ×3 IMPLANT
PACK UNIVERSAL I (CUSTOM PROCEDURE TRAY) ×3 IMPLANT
PAD ARMBOARD 7.5X6 YLW CONV (MISCELLANEOUS) ×6 IMPLANT
STAPLER VISISTAT (STAPLE) IMPLANT
SUT ETHILON 3 0 PS 1 (SUTURE) IMPLANT
SUT PROLENE 6 0 BV (SUTURE) ×2 IMPLANT
SUT VIC AB 2-0 CTB1 (SUTURE) ×2 IMPLANT
SUT VIC AB 3-0 SH 27 (SUTURE) ×3
SUT VIC AB 3-0 SH 27X BRD (SUTURE) IMPLANT
SUT VICRYL 4-0 PS2 18IN ABS (SUTURE) ×2 IMPLANT
SWAB CULTURE ESWAB REG 1ML (MISCELLANEOUS) ×2 IMPLANT
TAPE CLOTH SURG 4X10 WHT LF (GAUZE/BANDAGES/DRESSINGS) ×2 IMPLANT
TOWEL GREEN STERILE (TOWEL DISPOSABLE) ×3 IMPLANT
WATER STERILE IRR 1000ML POUR (IV SOLUTION) ×3 IMPLANT

## 2018-01-07 NOTE — Anesthesia Postprocedure Evaluation (Signed)
Anesthesia Post Note  Patient: Douglas [name]  Procedure(s) Performed: INCISION AND DEBRIDEMENT OF right GROIN WOUND (Right Groin)     Patient location during evaluation: PACU Anesthesia Type: General Level of consciousness: awake and alert Pain management: satisfactory to patient Vital Signs Assessment: post-procedure vital signs reviewed and stable Respiratory status: spontaneous breathing, nonlabored ventilation and respiratory function stable Cardiovascular status: blood pressure returned to baseline and stable Postop Assessment: no apparent nausea or vomiting Anesthetic complications: no    Last Vitals:  Vitals:   01/07/18 1540 01/07/18 1555  BP: (!) 161/93 (!) 158/96  Pulse: 67 64  Resp: 15 17  Temp:    SpO2: 99% 99%    Last Pain:  Vitals:   01/07/18 1555  TempSrc:   PainSc: 7                  Beryle Lathe

## 2018-01-07 NOTE — Anesthesia Preprocedure Evaluation (Addendum)
Anesthesia Evaluation  Patient identified by MRN, date of birth, ID band Patient awake    Reviewed: Allergy & Precautions, NPO status , Patient's Chart, lab work & pertinent test results  History of Anesthesia Complications Negative for: history of anesthetic complications  Airway Mallampati: II  TM Distance: >3 FB Neck ROM: Full    Dental  (+) Dental Advisory Given, Chipped, Poor Dentition   Pulmonary former smoker,    breath sounds clear to auscultation       Cardiovascular hypertension, Pt. on medications and Pt. on home beta blockers + CAD, + Past MI, + Cardiac Stents, + CABG and + Peripheral Vascular Disease   Rhythm:Regular Rate:Normal   '19 Cath - Ost LM to LM lesion is 50% stenosed. Prox LAD to Mid LAD lesion is 60% stenosed. Ost 2nd Diag lesion is 70% stenosed. Ost Cx to Prox Cx lesion is 90% stenosed. Prox Cx lesion is 99% stenosed. Prox RCA lesion is 20% stenosed. Post Atrio lesion is 80% stenosed. Mid RCA to Dist RCA lesion is 99% stenosed. Balloon angioplasty was performed. LIMA and is normal in caliber. Origin lesion is 40% stenosed. Origin lesion is 100% stenosed. Origin lesion is 100% stenosed. Origin lesion before RPDA is 100% stenosed. A drug-eluting stent was successfully placed using a STENT RESOLUTE ONYX 3.0X26. Post intervention, there is a 0% residual stenosis. The left ventricular systolic function is normal. LV end diastolic pressure is normal. The left ventricular ejection fraction is 55-65% by visual estimate. There is no mitral valve regurgitation. Dist L CIA to Dist L EIA lesion is 80% stenosed. Ost L IIA to Prox L IIA lesion is 90% stenosed. Ost R IIA to Prox R IIA lesion is 90% stenosed. Prox R CIA to Dist R CIA lesion is 20% stenosed. Prox L CIA to Dist L CIA lesion is 20% stenosed. Mid R CFA lesion is 60% stenosed.  1. 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  2. The LAD has high grade mid stenosis. The mid and distal LAD fills from the patent LIMA graft.  3. The Circumflex is small to moderate in caliber with diffuse severe mid stenosis, unchanged.  4. The RCA has mild disease in the proximal segment. The mid stented segment has severe restenosis. The small caliber posterolateral branch has a severe stenosis.  5. Successful PTCA/DES x 1 mid RCA. I elected to place a new stent here as the old stent quickly re-stenosed after the balloon dilatation 7 months ago. I did not think repeating balloon angioplasty of the restenotic stent would provide a durable result. A different stent platform with a different drug coating was used.  6. Distal Aorta with diffuse mild disease 7. Right common iliac artery with mild plaque. Right external iliac artery with mild plaque. Right internal iliac artery with 90% ostial stenosis.  Right common femoral artery with 60% stenosis (of note, cath from January 2019 with multiple views taken showing eccentric 90% stenosis of the right common femoral artery).  8. Left common iliac artery with diffuse mild plaque. The left external iliac artery has diffuse 80% stenosis. The left internal iliac artery has a 90% ostial stenosis. The left common femoral artery has a severe stenosis.  9. The right superficial femoral artery is patent. The right anterior tibial artery has a focal 90% stenosis. The right peroneal artery is patent. The right posterior tibial artery is occluded proximally and reconstitutes distally from collateral flow.  10. The left superficial femoral  artery is patent with a 70% stenosis in the distal segment. There is three vessel runoff to the left foot but the flow is sluggish.  11. Moderate left renal artery stenosis.   '19 TTE - Mild LVH. EF 55% to 60%. No valvulopathy reported.    Neuro/Psych  Headaches, PSYCHIATRIC DISORDERS Anxiety  PTSD    GI/Hepatic GERD  Medicated, Previous EtOH  abuse    Endo/Other  negative endocrine ROS  Renal/GU negative Renal ROS     Musculoskeletal negative musculoskeletal ROS (+)   Abdominal   Peds  Hematology negative hematology ROS (+)   Anesthesia Other Findings   Reproductive/Obstetrics                            Anesthesia Physical Anesthesia Plan  ASA: IV  Anesthesia Plan: General   Post-op Pain Management:    Induction: Intravenous  PONV Risk Score and Plan: 2 and Treatment may vary due to age or medical condition, Ondansetron, Dexamethasone and Midazolam  Airway Management Planned: LMA  Additional Equipment: None  Intra-op Plan:   Post-operative Plan: Extubation in OR  Informed Consent: I have reviewed the patients History and Physical, chart, labs and discussed the procedure including the risks, benefits and alternatives for the proposed anesthesia with the patient or authorized representative who has indicated his/her understanding and acceptance.   Dental advisory given  Plan Discussed with: CRNA and Anesthesiologist  Anesthesia Plan Comments:        Anesthesia Quick Evaluation

## 2018-01-07 NOTE — Progress Notes (Signed)
Pharmacy Antibiotic Note  Douglas [name] is a 52 y.o. male admitted on 01/04/2018 with Cellulitis.  Pharmacy has been consulted for Vancomycin dosing.  ID: abx d4 for cellulitis R groin. s/p left external iliac stent placed on 10/22 . Afeb, wbc wnl. Scr 1  10/26 vanc>> 10/26 zosyn>>  10/29: VT 11: no change  10/26 bcx -  10/26 uc -negative 10/29: MRSA PCR negative but SA positive   Plan: Zosyn 3.375g IV q8h (4h infusion) Vancomycin 750mg  IV q12h Groin I&D today     Temp (24hrs), Avg:98 F (36.7 C), Min:97.6 F (36.4 C), Max:98.5 F (36.9 C)  Recent Labs  Lab 01/04/18 1642 01/04/18 1716 01/04/18 2129 01/06/18 0349 01/07/18 0231 01/07/18 0929  WBC 10.1  --   --  6.4 5.7  --   CREATININE 1.05  --   --  1.05 1.04  --   LATICACIDVEN  --  2.08* 0.48*  --   --   --   VANCOTROUGH  --   --   --   --   --  11*    Estimated Creatinine Clearance: 80.8 mL/min (by C-G formula based on SCr of 1.04 mg/dL).    Allergies  Allergen Reactions  . Ibuprofen Nausea And Vomiting    UPSETS STOMACH  . Shellfish Allergy Nausea And Vomiting    [name] S. Merilynn Finland, PharmD, BCPS Clinical Staff Pharmacist Misty Stanley Stillinger 01/07/2018 10:50 AM

## 2018-01-07 NOTE — Anesthesia Procedure Notes (Signed)
Procedure Name: LMA Insertion Date/Time: 01/07/2018 1:58 PM Performed by: Alvera Novel, CRNA Pre-anesthesia Checklist: Patient identified, Emergency Drugs available, Suction available and Patient being monitored Patient Re-evaluated:Patient Re-evaluated prior to induction Oxygen Delivery Method: Circle System Utilized Preoxygenation: Pre-oxygenation with 100% oxygen Induction Type: IV induction Ventilation: Mask ventilation without difficulty LMA: LMA inserted LMA Size: 5.0 Number of attempts: 1 Airway Equipment and Method: Bite block Placement Confirmation: positive ETCO2 Tube secured with: Tape Dental Injury: Teeth and Oropharynx as per pre-operative assessment

## 2018-01-07 NOTE — Op Note (Signed)
    NAME: Douglas [name]    MRN: [id number] DOB: 09-19-1965    DATE OF OPERATION: 01/07/2018  PREOP DIAGNOSIS:    Right groin infection  POSTOP DIAGNOSIS:    Same  PROCEDURE:    Exploration of right groin and repair of right common femoral artery  SURGEON: Di Kindle. Edilia Bo, MD, FACS  ASSIST: Doreatha Massed, PA  ANESTHESIA: General  EBL: Minimal  INDICATIONS:    [name] is a 52 y.o. male who it undergone an arteriogram and iliac intervention via a right femoral approach.  He developed some unusual swelling in the right groin and redness.  This persisted despite antibiotics.  I felt expiration of the wound was indicated given that the skin was compromised over this swelling.  FINDINGS:   Patient had a reaction to the material from the minx closure device and intraoperative cultures were sent.  There was no active bleeding but I did place a repair suture in the common femoral artery at the cannulation site.  TECHNIQUE:   The patient was taken to the operating room and received a general anesthetic.  The right groin was prepped and draped in usual sterile fashion.  A transverse incision was made over the infected area and there was some purulent material which was drained and cultured.  Once the wound was open there was material from the Mynx device which likely formed a reaction.  This was removed.  I then tracked down to where the artery was cannulated and there was no active bleeding but I felt that this was at risk for bleeding and placed a 6 oh horizontal mattress suture.  The wound was irrigated with copious amounts of saline.  The wound was then closed with a deep layer of 2-0 Vicryl, the subcutaneous layer with 3-0 Vicryl.  I then excised the compromised skin superficially and then the skin was closed with 4-0 Vicryl.  Sterile dressing was applied.  Patient tolerated if she is well transferred to the recovery room in stable condition.  All needle and sponge  counts were correct.  Waverly Ferrari, MD, FACS [facility]  DATE OF DICTATION:   01/07/2018

## 2018-01-07 NOTE — Progress Notes (Signed)
Progress Note    Douglas [name]  GNF:[id number] DOB: 07-07-1965  DOA: 01/04/2018 PCP: Patient, No Pcp Per    Brief Narrative:    Douglas [name] is an 52 y.o. male with medical history significant of coronary artery disease status post CABG and stents, peripheral vascular disease, hyperlipidemia presenting to the hospital for evaluation of chest pain, shortness of breath, and chills.  Patient had a left iliac stent placed on October 22 by vascular surgery.  States since yesterday his right groin appears red swollen.  Area is painful.    He was admitted for further work-up  Assessment/Plan:   Principal Problem:   Cellulitis Active Problems:   CAD (coronary artery disease), native coronary artery   Chest pain   PVD (peripheral vascular disease) (HCC)   HTN (hypertension)   HLD (hyperlipidemia)   GERD (gastroesophageal reflux disease)   Sepsis (HCC)  Swelling at right groin status post radiograph he -s/p left external iliac stent placed on 10/22 and the access site was the right femoral artery - Associated with nausea and chills. -Doppler -right groin negative for pseudoaneurysm.  There is edematous tissue, focal area of heterogenicity approximately 1.5 x 1.5 cm (lymph node?)  Near area of redness and swelling. -Continue empirically with IV vancomycin and Zosyn, follow blood cultures, remains negative. -Vascular surgery has been consulted, given some abdominal pain today, and for CT abdomen pelvis, and further work-up and intervention per vascular surgery depends on imaging findings. -pain in uncontrolled- PO and IV for refractory pain  -Zofran PRN nausea -Blood culture x2 pending  Chest pain w/ h/o CAD s/p CABG and stents -CE negative -appreciate cardiology consult -CTA negative for PE -recent cath that showed re-stenosis and another stent was palced  Peripheral vascular disease -on ASA/plavix  Hypertension  -BP on lower side -resume home medications with  close management  Hyperlipidemia  -crestor  GERD -continue home meds   Family Communication/Anticipated D/C date and plan/Code Status   DVT prophylaxis: Lovenox ordered. Code Status: Full Code.  Family Communication: None at bedside Disposition Plan: Home when stable   Medical Consultants:    Cards  Vascular      Subjective:   He does report some right lower abdominal pain today  Objective:    Vitals:   01/06/18 2113 01/07/18 0228 01/07/18 0732 01/07/18 0830  BP: (!) 144/77 123/83 116/73   Pulse: 65 67    Resp: 10 12 15 14   Temp: 98.3 F (36.8 C) 98.5 F (36.9 C) 97.7 F (36.5 C)   TempSrc: Oral Oral Oral   SpO2: 99% 98% 95%     Intake/Output Summary (Last 24 hours) at 01/07/2018 1502 Last data filed at 01/07/2018 1447 Gross per 24 hour  Intake 1590 ml  Output 560 ml  Net 1030 ml   There were no vitals filed for this visit.  Exam: Awake Alert, Oriented X 3, No new F.N deficits, Normal affect Symmetrical Chest wall movement, Good air movement bilaterally, CTAB RRR,No Gallops,Rubs or new Murmurs, No Parasternal Heave He does have some right lower quadrant tenderness, all sounds present, nondistended Right groin with some swelling and erythema   Data Reviewed:   I have personally reviewed following labs and imaging studies:  Labs: Labs show the following:   Basic Metabolic Panel: Recent Labs  Lab 01/04/18 1642 01/06/18 0349 01/07/18 0231  NA 136 137 137  K 4.0 3.9 3.9  CL 105 107 108  CO2 21* 25 21*  GLUCOSE 85  93 115*  BUN 7 <5* <5*  CREATININE 1.05 1.05 1.04  CALCIUM 8.5* 8.1* 7.8*   GFR Estimated Creatinine Clearance: 80.8 mL/min (by C-G formula based on SCr of 1.04 mg/dL). Liver Function Tests: Recent Labs  Lab 01/04/18 1642  AST 24  ALT 14  ALKPHOS 96  BILITOT 0.2*  PROT 5.9*  ALBUMIN 3.3*   Recent Labs  Lab 01/04/18 1642  LIPASE 34   No results for input(s): AMMONIA in the last 168 hours. Coagulation  profile No results for input(s): INR, PROTIME in the last 168 hours.  CBC: Recent Labs  Lab 01/04/18 1642 01/06/18 0349 01/07/18 0231  WBC 10.1 6.4 5.7  HGB 16.7 14.2 13.5  HCT 51.1 43.7 42.1  MCV 99.0 99.8 99.8  PLT 314 236 244   Cardiac Enzymes: Recent Labs  Lab 01/04/18 2202 01/05/18 0354 01/05/18 0935  TROPONINI <0.03 <0.03 <0.03   BNP (last 3 results) No results for input(s): PROBNP in the last 8760 hours. CBG: No results for input(s): GLUCAP in the last 168 hours. D-Dimer: No results for input(s): DDIMER in the last 72 hours. Hgb A1c: No results for input(s): HGBA1C in the last 72 hours. Lipid Profile: No results for input(s): CHOL, HDL, LDLCALC, TRIG, CHOLHDL, LDLDIRECT in the last 72 hours. Thyroid function studies: No results for input(s): TSH, T4TOTAL, T3FREE, THYROIDAB in the last 72 hours.  Invalid input(s): FREET3 Anemia work up: No results for input(s): VITAMINB12, FOLATE, FERRITIN, TIBC, IRON, RETICCTPCT in the last 72 hours. Sepsis Labs: Recent Labs  Lab 01/04/18 1642 01/04/18 1716 01/04/18 2129 01/06/18 0349 01/07/18 0231  WBC 10.1  --   --  6.4 5.7  LATICACIDVEN  --  2.08* 0.48*  --   --     Microbiology Recent Results (from the past 240 hour(s))  Blood culture (routine x 2)     Status: None (Preliminary result)   Collection Time: 01/04/18  4:49 PM  Result Value Ref Range Status   Specimen Description BLOOD RIGHT ANTECUBITAL  Final   Special Requests   Final    BOTTLES DRAWN AEROBIC AND ANAEROBIC Blood Culture adequate volume   Culture   Final    NO GROWTH 3 DAYS Performed at [facility], [address] [place], [place] [address]    Report Status PENDING  Incomplete  Blood culture (routine x 2)     Status: None (Preliminary result)   Collection Time: 01/04/18  5:33 PM  Result Value Ref Range Status   Specimen Description BLOOD BLOOD RIGHT FOREARM  Final   Special Requests   Final    BOTTLES DRAWN AEROBIC AND ANAEROBIC  Blood Culture adequate volume   Culture   Final    NO GROWTH 3 DAYS Performed at [facility], [address], [place], [place] [address]    Report Status PENDING  Incomplete  Urine culture     Status: None   Collection Time: 01/04/18 10:35 PM  Result Value Ref Range Status   Specimen Description URINE, CLEAN CATCH  Final   Special Requests NONE  Final   Culture   Final    NO GROWTH Performed at [facility], [address], [place], [place] [address]    Report Status 01/06/2018 FINAL  Final  Surgical pcr screen     Status: Abnormal   Collection Time: 01/07/18  3:51 AM  Result Value Ref Range Status   MRSA, PCR NEGATIVE NEGATIVE Final   Staphylococcus aureus POSITIVE (A)  NEGATIVE Final    Comment: (NOTE) The Xpert SA Assay (FDA approved for NASAL specimens in patients 59 years of age and older), is one component of a comprehensive surveillance program. It is not intended to diagnose infection nor to guide or monitor treatment.   Aerobic/Anaerobic Culture (surgical/deep wound)     Status: None (Preliminary result)   Collection Time: 01/07/18  2:07 PM  Result Value Ref Range Status   Specimen Description GROIN RIGHT ABSCESS  Final   Special Requests   Final    NONE Performed at [facility], [address], [place], [place] [address]    Gram Stain PENDING  Incomplete   Culture PENDING  Incomplete   Report Status PENDING  Incomplete    Procedures and diagnostic studies:  Ct Angio Abd/pel W/ And/or W/o  Result Date: 01/06/2018 CLINICAL DATA:  52 year old male with right groin swelling status post cardiac catheterization last week. Evaluate for pseudoaneurysm. EXAM: CT ANGIOGRAPHY ABDOMEN AND PELVIS WITH CONTRAST AND WITHOUT CONTRAST TECHNIQUE: Multidetector CT imaging of the abdomen and pelvis was performed using the standard protocol during bolus administration of intravenous contrast. Multiplanar reconstructed images and MIPs were obtained and reviewed to  evaluate the vascular anatomy. CONTRAST:  100 mL Isovue 370 COMPARISON:  Prior CT scan of the abdomen and pelvis 04/10/2017 FINDINGS: VASCULAR Aorta: Moderate heterogeneous atherosclerotic plaque without evidence of aneurysm, dissection or penetrating ulceration. 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 No aneurysm or dissection. SMA: Mild fibrofatty atherosclerotic plaque in the proximal SMA resulting in perhaps mild stenosis. Renals: Single renal arteries bilaterally. No evidence of aneurysm, dissection or fibromuscular dysplasia. No significant stenosis on the right. Mild stenosis secondary to mixed atherosclerotic plaque on the left. IMA: High-grade stenosis at the origin of the inferior mesenteric artery. Inflow: Heterogeneous atherosclerotic plaque throughout both common iliac arteries without significant stenosis. The origins of the internal iliac arteries are stenosed bilaterally. There is a focal moderate stenosis in the proximal right external iliac artery. Left external iliac artery stent is widely patent. Proximal Outflow: Heterogeneous atherosclerotic plaque is present along the posterior wall of the common femoral arteries bilaterally. Slightly high bifurcation on the left. No evidence of pseudoaneurysm. The visualized portions of the bilateral profunda and superficial femoral arteries are patent. Veins: No focal venous abnormality. Review of the MIP images confirms the above findings. NON-VASCULAR Lower chest: Calcifications visualized in the coronary arteries. The heart is normal in size. No pericardial effusion. The lungs are clear. Minimal dependent atelectasis. Surgical changes of prior median sternotomy incompletely imaged. Hepatobiliary: Normal hepatic contour and morphology. No discrete hepatic lesions. Normal appearance of the gallbladder. No intra or extrahepatic  biliary ductal dilatation. Pancreas: Unremarkable. No pancreatic ductal dilatation or surrounding inflammatory changes. Spleen: Normal in size without focal abnormality. Adrenals/Urinary Tract: Adrenal glands are unremarkable. Kidneys are normal, without renal calculi, focal lesion, or hydronephrosis. Bladder is unremarkable. Stomach/Bowel: No evidence of obstruction or focal bowel wall thickening. Normal appendix in the right lower quadrant. The terminal ileum is unremarkable. Lymphatic: No suspicious lymphadenopathy. Reproductive: Prostate is unremarkable. Other: Small fat containing umbilical hernia. Trace free fluid present in the anatomic pelvis which is abnormal but nonspecific. Heterogeneous region of intermediate attenuation in the superficial subcutaneous fat anterior to the right common femoral artery and vein likely representing hematoma. There is slight thickening of the overlying skin. Musculoskeletal: No acute fracture or aggressive appearing lytic  or blastic osseous lesion. Chronic left-sided pars defect at L5. IMPRESSION: VASCULAR 1. Heterogeneous and amorphous intermediate density in the superficial subcutaneous fat overlying the right common femoral artery with thickening of the adjacent skin. Given history of recent groin puncture, this almost certainly represents a small amount of hematoma. In the appropriate clinical setting, cellulitis with phlegmon could appear similar. There is no evidence of arterial pseudoaneurysm or defined abscess. 2. Extensive heterogeneous atherosclerotic plaque throughout the aorta and branch arteries resulting in a moderate focal stenosis of the origin of the celiac axis and a high-grade stenosis of the origin of the inferior mesenteric artery. 3. Moderate focal stenosis of the proximal right external iliac artery. 4. Widely patent left external iliac artery stent. 5. Coronary artery calcifications. NON-VASCULAR 1. Small volume free fluid in the anatomic pelvis is  abnormal in a male but nonspecific. This can occasionally be seen in the setting of an infectious or inflammatory process involving the colon or small bowel which is otherwise occult by imaging. 2. Small fat containing umbilical hernia. 3. Chronic left L5 pars defect. Aortic Atherosclerosis (ICD10-170.0) Signed, Sterling Big, MD, RPVI [facility] Electronically Signed   By: Malachy Moan M.D.   On: 01/06/2018 11:39    Medications:   . [MAR Hold] aspirin EC  81 mg Oral Daily  . [MAR Hold] carvedilol  12.5 mg Oral BID WC  . [MAR Hold] Chlorhexidine Gluconate Cloth  6 each Topical Daily  . [MAR Hold] clopidogrel  75 mg Oral Daily  . [MAR Hold] enoxaparin (LOVENOX) injection  40 mg Subcutaneous Daily  . [MAR Hold] mupirocin ointment  1 application Nasal BID  . [MAR Hold] rosuvastatin  40 mg Oral q1800   Continuous Infusions: . sodium chloride 75 mL/hr at 01/06/18 2242  . lactated ringers 10 mL/hr at 01/07/18 1217  . [MAR Hold] piperacillin-tazobactam (ZOSYN)  IV 3.375 g (01/07/18 0503)  . [MAR Hold] vancomycin 750 mg (01/07/18 1042)     LOS: 2 days   Huey Bienenstock MD Pager [phone] [facility]   *Please refer to [email or web address], password [email or web address] to get updated schedule on who will round on this patient, as hospitalists switch teams weekly. If 7PM-7AM, please contact night-coverage at [email or web address], password [email or web address] for any overnight needs.  01/07/2018, 3:02 PM            Progress Note    Douglas [name]  XBJ:[id number] DOB: 04-Oct-1965  DOA: 01/04/2018 PCP: Patient, No Pcp Per    Brief Narrative:    Douglas [name] is an 52 y.o. male with medical history significant of coronary artery disease status post CABG and stents, peripheral vascular disease, hyperlipidemia presenting to the hospital for evaluation of chest pain, shortness of breath, and chills.  Patient had a left iliac stent placed on  October 22 by vascular surgery.  States since yesterday his right groin appears red swollen.  Area is painful.    He was admitted for further work-up he was seen by vascular surgery, he was significant for fluid correction most likely hematoma at the surgical site, he went for exploration of right groin area by Dr. Durwin Nora on 01/07/2018 which was significant for right groin infection.  Assessment/Plan:   Principal Problem:   Cellulitis Active Problems:   CAD (coronary artery disease), native coronary artery   Chest pain   PVD (peripheral vascular disease) (HCC)   HTN (hypertension)   HLD (hyperlipidemia)   GERD (gastroesophageal reflux disease)   Sepsis (  HCC)  Swelling at right groin status post radiograph he -s/p left external iliac stent placed on 10/22 and the access site was the right femoral artery - Associated with nausea and chills. -Doppler -right groin negative for pseudoaneurysm.  There is edematous tissue, focal area of heterogenicity approximately 1.5 x 1.5 cm (lymph node?)  Near area of redness and swelling. -Continue empirically with IV vancomycin and Zosyn, follow blood cultures, remains negative.  Follow on intraoperative cultures -Vascular surgery has been consulted, CT abdomen and pelvis significant for some right groin fluid collection suspicious for hematoma, he went for f right groin area by Dr. Durwin Nora on 01/07/2018 which was significant for right groin infection. -pain in uncontrolled- PO and IV for refractory pain  -Zofran PRN nausea -Blood culture x2 pending  Chest pain w/ h/o CAD s/p CABG and stents -CE negative -appreciate cardiology consult -CTA negative for PE -recent cath that showed re-stenosis and another stent was palced  Peripheral vascular disease -on ASA/plavix  Hypertension  -BP on lower side -resume home medications with close management  Hyperlipidemia  -crestor  GERD -continue home meds   Family Communication/Anticipated D/C date  and plan/Code Status   DVT prophylaxis: Lovenox ordered. Code Status: Full Code.  Family Communication: None at bedside Disposition Plan: Home when stable Procedures:   Exploration of right groin and repair of right common femoral artery Dr. Edilia Bo on 01/07/2018   Medical Consultants:    Cards  Vascular      Subjective:   He does report some right lower abdominal pain today  Objective:    Vitals:   01/06/18 2113 01/07/18 0228 01/07/18 0732 01/07/18 0830  BP: (!) 144/77 123/83 116/73   Pulse: 65 67    Resp: 10 12 15 14   Temp: 98.3 F (36.8 C) 98.5 F (36.9 C) 97.7 F (36.5 C)   TempSrc: Oral Oral Oral   SpO2: 99% 98% 95%     Intake/Output Summary (Last 24 hours) at 01/07/2018 1503 Last data filed at 01/07/2018 1447 Gross per 24 hour  Intake 1590 ml  Output 560 ml  Net 1030 ml   There were no vitals filed for this visit.  Exam:  Patient was seen and examined before surgery  Awake Alert, Oriented X 3, No new F.N deficits, Normal affect Symmetrical Chest wall movement, Good air movement bilaterally, CTAB RRR,No Gallops,Rubs or new Murmurs, No Parasternal Heave +ve B.Sounds, Abd Soft, mild right lower extremity tenderness, No rebound - guarding or rigidity. Right groin with some swelling and erythema   Data Reviewed:   I have personally reviewed following labs and imaging studies:  Labs: Labs show the following:   Basic Metabolic Panel: Recent Labs  Lab 01/04/18 1642 01/06/18 0349 01/07/18 0231  NA 136 137 137  K 4.0 3.9 3.9  CL 105 107 108  CO2 21* 25 21*  GLUCOSE 85 93 115*  BUN 7 <5* <5*  CREATININE 1.05 1.05 1.04  CALCIUM 8.5* 8.1* 7.8*   GFR Estimated Creatinine Clearance: 80.8 mL/min (by C-G formula based on SCr of 1.04 mg/dL). Liver Function Tests: Recent Labs  Lab 01/04/18 1642  AST 24  ALT 14  ALKPHOS 96  BILITOT 0.2*  PROT 5.9*  ALBUMIN 3.3*   Recent Labs  Lab 01/04/18 1642  LIPASE 34   No results for input(s):  AMMONIA in the last 168 hours. Coagulation profile No results for input(s): INR, PROTIME in the last 168 hours.  CBC: Recent Labs  Lab 01/04/18 1642 01/06/18 0349  01/07/18 0231  WBC 10.1 6.4 5.7  HGB 16.7 14.2 13.5  HCT 51.1 43.7 42.1  MCV 99.0 99.8 99.8  PLT 314 236 244   Cardiac Enzymes: Recent Labs  Lab 01/04/18 2202 01/05/18 0354 01/05/18 0935  TROPONINI <0.03 <0.03 <0.03   BNP (last 3 results) No results for input(s): PROBNP in the last 8760 hours. CBG: No results for input(s): GLUCAP in the last 168 hours. D-Dimer: No results for input(s): DDIMER in the last 72 hours. Hgb A1c: No results for input(s): HGBA1C in the last 72 hours. Lipid Profile: No results for input(s): CHOL, HDL, LDLCALC, TRIG, CHOLHDL, LDLDIRECT in the last 72 hours. Thyroid function studies: No results for input(s): TSH, T4TOTAL, T3FREE, THYROIDAB in the last 72 hours.  Invalid input(s): FREET3 Anemia work up: No results for input(s): VITAMINB12, FOLATE, FERRITIN, TIBC, IRON, RETICCTPCT in the last 72 hours. Sepsis Labs: Recent Labs  Lab 01/04/18 1642 01/04/18 1716 01/04/18 2129 01/06/18 0349 01/07/18 0231  WBC 10.1  --   --  6.4 5.7  LATICACIDVEN  --  2.08* 0.48*  --   --     Microbiology Recent Results (from the past 240 hour(s))  Blood culture (routine x 2)     Status: None (Preliminary result)   Collection Time: 01/04/18  4:49 PM  Result Value Ref Range Status   Specimen Description BLOOD RIGHT ANTECUBITAL  Final   Special Requests   Final    BOTTLES DRAWN AEROBIC AND ANAEROBIC Blood Culture adequate volume   Culture   Final    NO GROWTH 3 DAYS Performed at [facility], [address], [place], [place] [address]    Report Status PENDING  Incomplete  Blood culture (routine x 2)     Status: None (Preliminary result)   Collection Time: 01/04/18  5:33 PM  Result Value Ref Range Status   Specimen Description BLOOD BLOOD RIGHT FOREARM  Final   Special Requests   Final      BOTTLES DRAWN AEROBIC AND ANAEROBIC Blood Culture adequate volume   Culture   Final    NO GROWTH 3 DAYS Performed at [facility], [address] [place], [place] [address]    Report Status PENDING  Incomplete  Urine culture     Status: None   Collection Time: 01/04/18 10:35 PM  Result Value Ref Range Status   Specimen Description URINE, CLEAN CATCH  Final   Special Requests NONE  Final   Culture   Final    NO GROWTH Performed at [facility], [address], [place], [place] [address]    Report Status 01/06/2018 FINAL  Final  Surgical pcr screen     Status: Abnormal   Collection Time: 01/07/18  3:51 AM  Result Value Ref Range Status   MRSA, PCR NEGATIVE NEGATIVE Final   Staphylococcus aureus POSITIVE (A) NEGATIVE Final    Comment: (NOTE) The Xpert SA Assay (FDA approved for NASAL specimens in patients 40 years of age and older), is one component of a comprehensive surveillance program. It is not intended to diagnose infection nor to guide or monitor treatment.   Aerobic/Anaerobic Culture (surgical/deep wound)     Status: None (Preliminary result)   Collection Time: 01/07/18  2:07 PM  Result Value Ref Range Status   Specimen Description GROIN RIGHT ABSCESS  Final   Special Requests   Final    NONE Performed at [facility], [address], [place], [place] [address]  Gram Stain PENDING  Incomplete   Culture PENDING  Incomplete   Report Status PENDING  Incomplete    Procedures and diagnostic studies:  Ct Angio Abd/pel W/ And/or W/o  Result Date: 01/06/2018 CLINICAL DATA:  52 year old male with right groin swelling status post cardiac catheterization last week. Evaluate for pseudoaneurysm. EXAM: CT ANGIOGRAPHY ABDOMEN AND PELVIS WITH CONTRAST AND WITHOUT CONTRAST TECHNIQUE: Multidetector CT imaging of the abdomen and pelvis was performed using the standard protocol during bolus administration of intravenous contrast. Multiplanar reconstructed images  and MIPs were obtained and reviewed to evaluate the vascular anatomy. CONTRAST:  100 mL Isovue 370 COMPARISON:  Prior CT scan of the abdomen and pelvis 04/10/2017 FINDINGS: VASCULAR Aorta: Moderate heterogeneous atherosclerotic plaque without evidence of aneurysm, dissection or penetrating ulceration. 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 No aneurysm or dissection. SMA: Mild fibrofatty atherosclerotic plaque in the proximal SMA resulting in perhaps mild stenosis. Renals: Single renal arteries bilaterally. No evidence of aneurysm, dissection or fibromuscular dysplasia. No significant stenosis on the right. Mild stenosis secondary to mixed atherosclerotic plaque on the left. IMA: High-grade stenosis at the origin of the inferior mesenteric artery. Inflow: Heterogeneous atherosclerotic plaque throughout both common iliac arteries without significant stenosis. The origins of the internal iliac arteries are stenosed bilaterally. There is a focal moderate stenosis in the proximal right external iliac artery. Left external iliac artery stent is widely patent. Proximal Outflow: Heterogeneous atherosclerotic plaque is present along the posterior wall of the common femoral arteries bilaterally. Slightly high bifurcation on the left. No evidence of pseudoaneurysm. The visualized portions of the bilateral profunda and superficial femoral arteries are patent. Veins: No focal venous abnormality. Review of the MIP images confirms the above findings. NON-VASCULAR Lower chest: Calcifications visualized in the coronary arteries. The heart is normal in size. No pericardial effusion. The lungs are clear. Minimal dependent atelectasis. Surgical changes of prior median sternotomy incompletely imaged. Hepatobiliary: Normal hepatic contour and morphology. No discrete hepatic lesions. Normal appearance of the  gallbladder. No intra or extrahepatic biliary ductal dilatation. Pancreas: Unremarkable. No pancreatic ductal dilatation or surrounding inflammatory changes. Spleen: Normal in size without focal abnormality. Adrenals/Urinary Tract: Adrenal glands are unremarkable. Kidneys are normal, without renal calculi, focal lesion, or hydronephrosis. Bladder is unremarkable. Stomach/Bowel: No evidence of obstruction or focal bowel wall thickening. Normal appendix in the right lower quadrant. The terminal ileum is unremarkable. Lymphatic: No suspicious lymphadenopathy. Reproductive: Prostate is unremarkable. Other: Small fat containing umbilical hernia. Trace free fluid present in the anatomic pelvis which is abnormal but nonspecific. Heterogeneous region of intermediate attenuation in the superficial subcutaneous fat anterior to the right common femoral artery and vein likely representing hematoma. There is slight thickening of the overlying skin. Musculoskeletal: No acute fracture or aggressive appearing lytic or blastic osseous lesion. Chronic left-sided pars defect at L5. IMPRESSION: VASCULAR 1. Heterogeneous and amorphous intermediate density in the superficial subcutaneous fat overlying the right common femoral artery with thickening of the adjacent skin. Given history of recent groin puncture, this almost certainly represents a small amount of hematoma. In the appropriate clinical setting, cellulitis with phlegmon could appear similar. There is no evidence of arterial pseudoaneurysm or defined abscess. 2. Extensive heterogeneous atherosclerotic plaque throughout the aorta and branch arteries resulting in a moderate focal stenosis of the origin of the celiac axis and a high-grade stenosis of the origin of the inferior mesenteric artery. 3. Moderate  focal stenosis of the proximal right external iliac artery. 4. Widely patent left external iliac artery stent. 5. Coronary artery calcifications. NON-VASCULAR 1. Small volume  free fluid in the anatomic pelvis is abnormal in a male but nonspecific. This can occasionally be seen in the setting of an infectious or inflammatory process involving the colon or small bowel which is otherwise occult by imaging. 2. Small fat containing umbilical hernia. 3. Chronic left L5 pars defect. Aortic Atherosclerosis (ICD10-170.0) Signed, Sterling Big, MD, RPVI [facility] Radiology Electronically Signed   By: Malachy Moan M.D.   On: 01/06/2018 11:39    Medications:   . [MAR Hold] aspirin EC  81 mg Oral Daily  . [MAR Hold] carvedilol  12.5 mg Oral BID WC  . [MAR Hold] Chlorhexidine Gluconate Cloth  6 each Topical Daily  . [MAR Hold] clopidogrel  75 mg Oral Daily  . [MAR Hold] enoxaparin (LOVENOX) injection  40 mg Subcutaneous Daily  . [MAR Hold] mupirocin ointment  1 application Nasal BID  . [MAR Hold] rosuvastatin  40 mg Oral q1800   Continuous Infusions: . sodium chloride 75 mL/hr at 01/06/18 2242  . lactated ringers 10 mL/hr at 01/07/18 1217  . [MAR Hold] piperacillin-tazobactam (ZOSYN)  IV 3.375 g (01/07/18 0503)  . [MAR Hold] vancomycin 750 mg (01/07/18 1042)     LOS: 2 days   Huey Bienenstock MD Pager [phone] [facility]   *Please refer to [email or web address], password [email or web address] to get updated schedule on who will round on this patient, as hospitalists switch teams weekly. If 7PM-7AM, please contact night-coverage at [email or web address], password [email or web address] for any overnight needs.  01/07/2018, 3:03 PM

## 2018-01-07 NOTE — Interval H&P Note (Signed)
History and Physical Interval Note:  01/07/2018 1:31 PM  Douglas [name]  has presented today for surgery, with the diagnosis of right groin swelling  The various methods of treatment have been discussed with the patient and family. After consideration of risks, benefits and other options for treatment, the patient has consented to  Procedure(s): INCISION AND DEBRIDEMENT OF GROIN WOUND (Right) as a surgical intervention .  The patient's history has been reviewed, patient examined, no change in status, stable for surgery.  I have reviewed the patient's chart and labs.  Questions were answered to the patient's satisfaction.     Waverly Ferrari

## 2018-01-07 NOTE — Transfer of Care (Signed)
Immediate Anesthesia Transfer of Care Note  Patient: Douglas [name]  Procedure(s) Performed: INCISION AND DEBRIDEMENT OF right GROIN WOUND (Right Groin)  Patient Location: PACU  Anesthesia Type:General  Level of Consciousness: awake and alert   Airway & Oxygen Therapy: Patient Spontanous Breathing and Patient connected to face mask oxygen  Post-op Assessment: Report given to RN and Post -op Vital signs reviewed and stable  Post vital signs: Reviewed and stable  Last Vitals:  Vitals Value Taken Time  BP 142/119 01/07/2018  2:54 PM  Temp    Pulse 72 01/07/2018  2:55 PM  Resp 13 01/07/2018  2:55 PM  SpO2 99 % 01/07/2018  2:55 PM  Vitals shown include unvalidated device data.  Last Pain:  Vitals:   01/07/18 1049  TempSrc:   PainSc: 7          Complications: No apparent anesthesia complications

## 2018-01-08 ENCOUNTER — Encounter (HOSPITAL_COMMUNITY): Payer: Self-pay | Admitting: Vascular Surgery

## 2018-01-08 LAB — CBC
HCT: 43 % (ref 39.0–52.0)
HEMOGLOBIN: 14.1 g/dL (ref 13.0–17.0)
MCH: 32.5 pg (ref 26.0–34.0)
MCHC: 32.8 g/dL (ref 30.0–36.0)
MCV: 99.1 fL (ref 80.0–100.0)
Platelets: 247 10*3/uL (ref 150–400)
RBC: 4.34 MIL/uL (ref 4.22–5.81)
RDW: 12.6 % (ref 11.5–15.5)
WBC: 11.1 10*3/uL — AB (ref 4.0–10.5)
nRBC: 0 % (ref 0.0–0.2)

## 2018-01-08 LAB — BASIC METABOLIC PANEL
ANION GAP: 7 (ref 5–15)
BUN: <5 mg/dL — ABNORMAL LOW (ref 6–20)
CHLORIDE: 102 mmol/L (ref 98–111)
CO2: 28 mmol/L (ref 22–32)
Calcium: 8.2 mg/dL — ABNORMAL LOW (ref 8.9–10.3)
Creatinine, Ser: 1 mg/dL (ref 0.61–1.24)
GFR calc non Af Amer: >60 mL/min (ref >60)
GLUCOSE: 166 mg/dL — AB (ref 70–99)
Potassium: 3.9 mmol/L (ref 3.5–5.1)
Sodium: 137 mmol/L (ref 135–145)

## 2018-01-08 MED ORDER — MORPHINE SULFATE (PF) 2 MG/ML IV SOLN
2.0000 mg | INTRAVENOUS | Status: DC | PRN
  Administered 2018-01-08 – 2018-01-09 (×5): 2 mg via INTRAVENOUS
  Filled 2018-01-08 (×5): qty 1

## 2018-01-08 MED ORDER — ALUM & MAG HYDROXIDE-SIMETH 200-200-20 MG/5ML PO SUSP
30.0000 mL | ORAL | Status: DC | PRN
  Administered 2018-01-08: 30 mL via ORAL
  Filled 2018-01-08: qty 30

## 2018-01-08 MED ORDER — PANTOPRAZOLE SODIUM 40 MG PO TBEC
40.0000 mg | DELAYED_RELEASE_TABLET | Freq: Every day | ORAL | Status: DC
  Administered 2018-01-08 – 2018-01-10 (×3): 40 mg via ORAL
  Filled 2018-01-08 (×3): qty 1

## 2018-01-08 MED ORDER — OXYCODONE HCL 5 MG PO TABS
10.0000 mg | ORAL_TABLET | ORAL | Status: DC | PRN
  Administered 2018-01-08 – 2018-01-10 (×11): 10 mg via ORAL
  Filled 2018-01-08 (×11): qty 2

## 2018-01-08 MED ORDER — ACETAMINOPHEN 325 MG PO TABS: 650.0000 mg | ORAL_TABLET | Freq: Four times a day (QID) | ORAL | Status: DC | PRN

## 2018-01-08 NOTE — Progress Notes (Signed)
Progress Note    Douglas [name]  ONG:[id number] DOB: Dec 07, 1965  DOA: 01/04/2018 PCP: Patient, No Pcp Per    Brief Narrative:    Douglas [name] is an 52 y.o. male with medical history significant of coronary artery disease status post CABG and stents, peripheral vascular disease, hyperlipidemia presenting to the hospital for evaluation of chest pain, shortness of breath, and chills.  Patient had a left iliac stent placed on October 22 by vascular surgery.  States since day prior to admission his right groin appeared red swollen.  Area is painful.    He was admitted for further work-up  Assessment/Plan:   Principal Problem:   Cellulitis Active Problems:   CAD (coronary artery disease), native coronary artery   Chest pain   PVD (peripheral vascular disease) (HCC)   HTN (hypertension)   HLD (hyperlipidemia)   GERD (gastroesophageal reflux disease)   Sepsis (HCC)  Cellulitis/abscess of the right groin - s/p left external iliac stent placed on 10/22 and the access site was the right femoral artery - Doppler -right groin negative for pseudoaneurysm.  There is edematous tissue, focal area of heterogenicity approximately 1.5 x 1.5 cm (lymph node?)  Near area of redness and swelling. - Continue empirically with IV vancomycin and Zosyn -Blood cultures x2 from 10/26: Negative to date.  Urine culture negative.  Right groin abscess culture negative to date. -Vascular surgery was consulted.  CTA of abdomen and pelvis 10/28 showed findings consistent with right groin hematoma versus cellulitis with phlegmon but no evidence of pseudoaneurysm or defined abscess.  He then underwent exploration of right groin and repair of right common femoral artery on 10/29 -Ongoing significant pain issues, adjusted pain regimen 10/30. -Vascular surgery follow-up appreciated, cleared for discharge when ambulating and pain controlled on p.o. medications which it has not yet been.  Chest pain w/ h/o CAD  s/p CABG and stents - CE negative - appreciate cardiology consult - CTA negative for PE - recent cath that showed re-stenosis and another stent was palced  Peripheral vascular disease -on ASA/plavix  Hypertension  -Controlled -resume home medications with close management  Hyperlipidemia  -crestor  GERD -continue home meds.  Patient reports intermittent nausea vomiting and heartburn ongoing for a long time.  Added PPI.  Family Communication/Anticipated D/C date and plan/Code Status   DVT prophylaxis: Lovenox ordered. Code Status: Full Code.  Family Communication: Discussed in detail with patient's godfather at bedside.  Updated care and answered questions. Disposition Plan: Home when clinically improved.   Medical Consultants:    Cards  Vascular      Subjective:   Reports an episode of nausea and nonbloody emesis this morning.  Indicates that he has chronic history of intermittent heartburns, nausea and vomiting but has not seen GI.  Ongoing significant right groin pain, not controlled on current pain regimen.  Objective:    Vitals:   01/07/18 2053 01/08/18 0500 01/08/18 0523 01/08/18 1228  BP: 133/76 126/79 126/79 126/68  Pulse:    68  Resp: 16 16 17 18   Temp: 97.9 F (36.6 C) 98 F (36.7 C)  (!) 97.5 F (36.4 C)  TempSrc: Oral Oral  Oral  SpO2: 99% 95% 95% 99%    Intake/Output Summary (Last 24 hours) at 01/08/2018 1742 Last data filed at 01/08/2018 1458 Gross per 24 hour  Intake 1090 ml  Output 2421 ml  Net -1331 ml   There were no vitals filed for this visit.  Exam: General exam: Pleasant  young male, moderately built and nourished lying comfortably propped up in bed. Respiratory system: Clear to auscultation.  No increased work of breathing. Cardiovascular system: S1 and S2 heard, RRR.  No JVD, murmurs or pedal edema.  Telemetry personally reviewed: Sinus rhythm. Gastrointestinal system: Abdomen is nondistended, soft and nontender.  Normal  bowel sounds heard. CNS: Alert and oriented.  No focal neurological deficits. Extremities: Symmetric 5 x 5 power.  Right groin dressing clean and intact and no acute findings noted.   Data Reviewed:   I have personally reviewed following labs and imaging studies:  Labs: Labs show the following:   Basic Metabolic Panel: Recent Labs  Lab 01/04/18 1642 01/06/18 0349 01/07/18 0231 01/08/18 0332  NA 136 137 137 137  K 4.0 3.9 3.9 3.9  CL 105 107 108 102  CO2 21* 25 21* 28  GLUCOSE 85 93 115* 166*  BUN 7 <5* <5* <5*  CREATININE 1.05 1.05 1.04 1.00  CALCIUM 8.5* 8.1* 7.8* 8.2*   GFR Estimated Creatinine Clearance: 84.1 mL/min (by C-G formula based on SCr of 1 mg/dL). Liver Function Tests: Recent Labs  Lab 01/04/18 1642  AST 24  ALT 14  ALKPHOS 96  BILITOT 0.2*  PROT 5.9*  ALBUMIN 3.3*   Recent Labs  Lab 01/04/18 1642  LIPASE 34    CBC: Recent Labs  Lab 01/04/18 1642 01/06/18 0349 01/07/18 0231 01/08/18 0332  WBC 10.1 6.4 5.7 11.1*  HGB 16.7 14.2 13.5 14.1  HCT 51.1 43.7 42.1 43.0  MCV 99.0 99.8 99.8 99.1  PLT 314 236 244 247   Cardiac Enzymes: Recent Labs  Lab 01/04/18 2202 01/05/18 0354 01/05/18 0935  TROPONINI <0.03 <0.03 <0.03     Microbiology Recent Results (from the past 240 hour(s))  Blood culture (routine x 2)     Status: None (Preliminary result)   Collection Time: 01/04/18  4:49 PM  Result Value Ref Range Status   Specimen Description BLOOD RIGHT ANTECUBITAL  Final   Special Requests   Final    BOTTLES DRAWN AEROBIC AND ANAEROBIC Blood Culture adequate volume   Culture   Final    NO GROWTH 4 DAYS Performed at [facility], [address], [place], [place] [address]    Report Status PENDING  Incomplete  Blood culture (routine x 2)     Status: None (Preliminary result)   Collection Time: 01/04/18  5:33 PM  Result Value Ref Range Status   Specimen Description BLOOD BLOOD RIGHT FOREARM  Final   Special Requests   Final     BOTTLES DRAWN AEROBIC AND ANAEROBIC Blood Culture adequate volume   Culture   Final    NO GROWTH 4 DAYS Performed at [facility], [address] [place], [place] [address]    Report Status PENDING  Incomplete  Urine culture     Status: None   Collection Time: 01/04/18 10:35 PM  Result Value Ref Range Status   Specimen Description URINE, CLEAN CATCH  Final   Special Requests NONE  Final   Culture   Final    NO GROWTH Performed at [facility], [address] [place], [place] [address]    Report Status 01/06/2018 FINAL  Final  Surgical pcr screen     Status: Abnormal   Collection Time: 01/07/18  3:51 AM  Result Value Ref Range Status   MRSA, PCR NEGATIVE NEGATIVE Final   Staphylococcus aureus POSITIVE (A) NEGATIVE Final    Comment: (NOTE) The  Xpert SA Assay (FDA approved for NASAL specimens in patients 23 years of age and older), is one component of a comprehensive surveillance program. It is not intended to diagnose infection nor to guide or monitor treatment.   Aerobic/Anaerobic Culture (surgical/deep wound)     Status: None (Preliminary result)   Collection Time: 01/07/18  2:07 PM  Result Value Ref Range Status   Specimen Description GROIN RIGHT ABSCESS  Final   Special Requests NONE  Final   Gram Stain   Final    FEW WBC PRESENT, PREDOMINANTLY PMN NO ORGANISMS SEEN    Culture   Final    NO GROWTH < 24 HOURS Performed at [facility], [address] [place], [place] [address]    Report Status PENDING  Incomplete  Aerobic/Anaerobic Culture (surgical/deep wound)     Status: None (Preliminary result)   Collection Time: 01/07/18  2:11 PM  Result Value Ref Range Status   Specimen Description ABSCESS GROIN RIGHT  Final   Special Requests NONE  Final   Gram Stain   Final    MODERATE WBC PRESENT, PREDOMINANTLY PMN NO ORGANISMS SEEN    Culture   Final    NO GROWTH < 24 HOURS Performed at [facility], [address] [place], [place] [address]     Report Status PENDING  Incomplete    Procedures and diagnostic studies:  No results found.  Medications:   . aspirin EC  81 mg Oral Daily  . carvedilol  12.5 mg Oral BID WC  . Chlorhexidine Gluconate Cloth  6 each Topical Daily  . clopidogrel  75 mg Oral Daily  . enoxaparin (LOVENOX) injection  40 mg Subcutaneous Daily  . mupirocin ointment  1 application Nasal BID  . pantoprazole  40 mg Oral Daily  . rosuvastatin  40 mg Oral q1800   Continuous Infusions: . sodium chloride 75 mL/hr at 01/08/18 0651  . piperacillin-tazobactam (ZOSYN)  IV 3.375 g (01/08/18 1453)  . vancomycin 750 mg (01/08/18 1017)     LOS: 3 days   Marcellus Scott, MD, FACP, [facility]. [facility] Pager [phone]  If 7PM-7AM, please contact night-coverage [email or web address] Password [email or web address] 01/08/2018, 5:55 PM

## 2018-01-08 NOTE — Progress Notes (Signed)
   VASCULAR SURGERY ASSESSMENT & PLAN:   POSTOP DAY 1 STATUS POST EXPLORATION OF RIGHT GROIN: He appeared to have had a reaction to the Minx closure device.  He can be discharged when he is ambulating and his pain is controlled on po pain medication.  ID: His culture was positive for staph aureus.  Sensitivities are pending.  I will arrange follow-up with Dr. Myra Gianotti after discharge.  SUBJECTIVE:   Pain adequately controlled.  PHYSICAL EXAM:   Vitals:   01/07/18 1555 01/07/18 2053 01/08/18 0500 01/08/18 0523  BP: (!) 158/96 133/76 126/79 126/79  Pulse: 64     Resp: 17 16 16 17   Temp:  97.9 F (36.6 C) 98 F (36.7 C)   TempSrc:  Oral Oral   SpO2: 99% 99% 95% 95%   His incision looks fine.  LABS:   Lab Results  Component Value Date   WBC 11.1 (H) 01/08/2018   HGB 14.1 01/08/2018   HCT 43.0 01/08/2018   MCV 99.1 01/08/2018   PLT 247 01/08/2018   Lab Results  Component Value Date   CREATININE 1.00 01/08/2018   Lab Results  Component Value Date   INR 0.88 04/11/2017   CBG (last 3)  No results for input(s): GLUCAP in the last 72 hours.  PROBLEM LIST:    Principal Problem:   Cellulitis Active Problems:   CAD (coronary artery disease), native coronary artery   Chest pain   PVD (peripheral vascular disease) (HCC)   HTN (hypertension)   HLD (hyperlipidemia)   GERD (gastroesophageal reflux disease)   Sepsis (HCC)   CURRENT MEDS:   . aspirin EC  81 mg Oral Daily  . carvedilol  12.5 mg Oral BID WC  . Chlorhexidine Gluconate Cloth  6 each Topical Daily  . clopidogrel  75 mg Oral Daily  . enoxaparin (LOVENOX) injection  40 mg Subcutaneous Daily  . mupirocin ointment  1 application Nasal BID  . rosuvastatin  40 mg Oral q1800    Waverly Ferrari Beeper: [phone] Office: [phone] 01/08/2018

## 2018-01-09 LAB — CULTURE, BLOOD (ROUTINE X 2)
CULTURE: NO GROWTH
Culture: NO GROWTH
SPECIAL REQUESTS: ADEQUATE
Special Requests: ADEQUATE

## 2018-01-09 MED ORDER — AMOXICILLIN-POT CLAVULANATE 875-125 MG PO TABS
1.0000 | ORAL_TABLET | Freq: Two times a day (BID) | ORAL | Status: DC
  Administered 2018-01-09 – 2018-01-10 (×2): 1 via ORAL
  Filled 2018-01-09 (×2): qty 1

## 2018-01-09 MED ORDER — ACETAMINOPHEN 500 MG PO TABS
1000.0000 mg | ORAL_TABLET | Freq: Three times a day (TID) | ORAL | Status: DC
  Administered 2018-01-09 – 2018-01-10 (×4): 1000 mg via ORAL
  Filled 2018-01-09 (×4): qty 2

## 2018-01-09 NOTE — Progress Notes (Signed)
Progress Note    Douglas [name]  WUJ:[id number] DOB: Jul 20, 1965  DOA: 01/04/2018 PCP: Patient, No Pcp Per    Brief Narrative:    Douglas [name] is an 52 y.o. male with medical history significant of coronary artery disease status post CABG and stents, peripheral vascular disease, hyperlipidemia presenting to the hospital for evaluation of chest pain, shortness of breath, and chills.  Patient had a left iliac stent placed on October 22 by vascular surgery.  States since day prior to admission his right groin appeared red swollen.  Area is painful.    He was admitted for suspected cellulitis of his right groin, treated with IV antibiotics and had vascular surgery 10/29.  Slowly improving.  Assessment/Plan:   Principal Problem:   Cellulitis Active Problems:   CAD (coronary artery disease), native coronary artery   Chest pain   PVD (peripheral vascular disease) (HCC)   HTN (hypertension)   HLD (hyperlipidemia)   GERD (gastroesophageal reflux disease)   Sepsis (HCC)  Cellulitis/abscess of the right groin - s/p left external iliac stent placed on 10/22 and the access site was the right femoral artery -Right lower extremity arterial Doppler 10/26: Right groin appears negative for pseudoaneurysm.  Edematous tissue noted, focal area of heterogenicity approximately 1.5 x 1.5 cm near area of redness and swelling. -Treated empirically with IV vancomycin and Zosyn - Blood cultures x2 from 10/26: Negative, final report.  Urine culture negative.  Right groin operative culture from 10/29 x 2: Negative to date. -Vascular surgery was consulted.  CTA of abdomen and pelvis 10/28 showed findings consistent with right groin hematoma versus cellulitis with phlegmon but no evidence of pseudoaneurysm or defined abscess.  He then underwent exploration of right groin and repair of right common femoral artery on 10/29.  As per my discussion with Dr. Edilia Bo on 10/31, patient had no abscess, possibly  had right groin cellulitis and he appeared to have had a reaction to the Minx closure device. -Still reports ongoing pain issues but according to RN, seen ambulating comfortably without painful distress.  Minimize IV pain medications. - Vascular surgery follow-up appreciated, cleared for discharge when ambulating and pain controlled on p.o. medications which it has not yet been. -Day 6 of IV antibiotics today.  Cultures thus far negative.  Staph aureus was from nasal swabs.  As communicated with pharmacy, transitioned to oral Augmentin on 10/31 to complete total 10 days treatment.  Chest pain w/ h/o CAD s/p CABG and stents - CE negative - appreciate cardiology consult - CTA negative for PE - recent cath that showed re-stenosis and another stent was palced  Peripheral vascular disease -on ASA/plavix  Hypertension  -Controlled -resume home medications with close management  Hyperlipidemia  -crestor  GERD -continue home meds.  Patient reports intermittent nausea vomiting and heartburn ongoing for a long time.  Added PPI.  No further emesis.  Family Communication/Anticipated D/C date and plan/Code Status   DVT prophylaxis: Lovenox ordered. Code Status: Full Code.  Family Communication: Discussed in detail with patient's godfather at bedside.  Updated care and answered questions. Disposition Plan: Home when clinically improved.   Medical Consultants:    Cards  Vascular      Subjective:   Ongoing pain issues in the right groin.  No further nausea, vomiting or heartburn.  As per RN, patient seen ambulating comfortably without painful distress.  Objective:    Vitals:   01/08/18 1228 01/08/18 2007 01/09/18 0409 01/09/18 1324  BP: 126/68  Marland Kitchen)  142/81 123/70  Pulse: 68 64 78 67  Resp: 18   19  Temp: (!) 97.5 F (36.4 C) 98.1 F (36.7 C) 97.8 F (36.6 C) 97.8 F (36.6 C)  TempSrc: Oral Oral Oral Oral  SpO2: 99%  99% 99%    Intake/Output Summary (Last 24 hours) at  01/09/2018 1605 Last data filed at 01/09/2018 1324 Gross per 24 hour  Intake 3841.84 ml  Output 1900 ml  Net 1941.84 ml   There were no vitals filed for this visit.  Exam: General exam: Pleasant young male, moderately built and nourished lying comfortably propped up in bed.  Stable Respiratory system: Clear to auscultation.  No increased work of breathing.  Stable Cardiovascular system: S1 and S2 heard, RRR.  No JVD, murmurs or pedal edema.  Telemetry personally reviewed: Sinus rhythm.  Occasional mild sinus tachycardia. Gastrointestinal system: Abdomen is nondistended, soft and nontender.  Normal bowel sounds heard.  Stable CNS: Alert and oriented.  No focal neurological deficits. Extremities: Symmetric 5 x 5 power.  Right groin dressing clean and intact and no acute findings noted. Skin: Patchy areas of bruising noted over right anterior lateral chest wall up to axilla, patient reports that he had been scratching those areas.?  Related to pruritus.  Need to closely monitor.   Data Reviewed:   I have personally reviewed following labs and imaging studies:  Labs: Labs show the following:   Basic Metabolic Panel: Recent Labs  Lab 01/04/18 1642 01/06/18 0349 01/07/18 0231 01/08/18 0332  NA 136 137 137 137  K 4.0 3.9 3.9 3.9  CL 105 107 108 102  CO2 21* 25 21* 28  GLUCOSE 85 93 115* 166*  BUN 7 <5* <5* <5*  CREATININE 1.05 1.05 1.04 1.00  CALCIUM 8.5* 8.1* 7.8* 8.2*   GFR Estimated Creatinine Clearance: 84.1 mL/min (by C-G formula based on SCr of 1 mg/dL). Liver Function Tests: Recent Labs  Lab 01/04/18 1642  AST 24  ALT 14  ALKPHOS 96  BILITOT 0.2*  PROT 5.9*  ALBUMIN 3.3*   Recent Labs  Lab 01/04/18 1642  LIPASE 34    CBC: Recent Labs  Lab 01/04/18 1642 01/06/18 0349 01/07/18 0231 01/08/18 0332  WBC 10.1 6.4 5.7 11.1*  HGB 16.7 14.2 13.5 14.1  HCT 51.1 43.7 42.1 43.0  MCV 99.0 99.8 99.8 99.1  PLT 314 236 244 247   Cardiac Enzymes: Recent Labs    Lab 01/04/18 2202 01/05/18 0354 01/05/18 0935  TROPONINI <0.03 <0.03 <0.03     Microbiology Recent Results (from the past 240 hour(s))  Blood culture (routine x 2)     Status: None   Collection Time: 01/04/18  4:49 PM  Result Value Ref Range Status   Specimen Description BLOOD RIGHT ANTECUBITAL  Final   Special Requests   Final    BOTTLES DRAWN AEROBIC AND ANAEROBIC Blood Culture adequate volume   Culture   Final    NO GROWTH 5 DAYS Performed at [facility], [address] [place], [place] [address]    Report Status 01/09/2018 FINAL  Final  Blood culture (routine x 2)     Status: None   Collection Time: 01/04/18  5:33 PM  Result Value Ref Range Status   Specimen Description BLOOD BLOOD RIGHT FOREARM  Final   Special Requests   Final    BOTTLES DRAWN AEROBIC AND ANAEROBIC Blood Culture adequate volume   Culture   Final    NO GROWTH 5 DAYS Performed at  [facility], [address], [place], [place] [address]    Report Status 01/09/2018 FINAL  Final  Urine culture     Status: None   Collection Time: 01/04/18 10:35 PM  Result Value Ref Range Status   Specimen Description URINE, CLEAN CATCH  Final   Special Requests NONE  Final   Culture   Final    NO GROWTH Performed at [facility], [address], [place], [place] [address]    Report Status 01/06/2018 FINAL  Final  Surgical pcr screen     Status: Abnormal   Collection Time: 01/07/18  3:51 AM  Result Value Ref Range Status   MRSA, PCR NEGATIVE NEGATIVE Final   Staphylococcus aureus POSITIVE (A) NEGATIVE Final    Comment: (NOTE) The Xpert SA Assay (FDA approved for NASAL specimens in patients 60 years of age and older), is one component of a comprehensive surveillance program. It is not intended to diagnose infection nor to guide or monitor treatment.   Aerobic/Anaerobic Culture (surgical/deep wound)     Status: None (Preliminary result)   Collection Time: 01/07/18  2:07 PM  Result Value Ref  Range Status   Specimen Description GROIN RIGHT ABSCESS  Final   Special Requests NONE  Final   Gram Stain   Final    FEW WBC PRESENT, PREDOMINANTLY PMN NO ORGANISMS SEEN    Culture   Final    NO GROWTH 2 DAYS Performed at [facility], [address] [place], [place] [address]    Report Status PENDING  Incomplete  Aerobic/Anaerobic Culture (surgical/deep wound)     Status: None (Preliminary result)   Collection Time: 01/07/18  2:11 PM  Result Value Ref Range Status   Specimen Description ABSCESS GROIN RIGHT  Final   Special Requests NONE  Final   Gram Stain   Final    MODERATE WBC PRESENT, PREDOMINANTLY PMN NO ORGANISMS SEEN    Culture   Final    NO GROWTH 2 DAYS Performed at [facility], [address], [place], [place] [address]    Report Status PENDING  Incomplete    Procedures and diagnostic studies:  No results found.  Medications:   . acetaminophen  1,000 mg Oral TID  . aspirin EC  81 mg Oral Daily  . carvedilol  12.5 mg Oral BID WC  . Chlorhexidine Gluconate Cloth  6 each Topical Daily  . clopidogrel  75 mg Oral Daily  . enoxaparin (LOVENOX) injection  40 mg Subcutaneous Daily  . mupirocin ointment  1 application Nasal BID  . pantoprazole  40 mg Oral Daily  . rosuvastatin  40 mg Oral q1800   Continuous Infusions: . sodium chloride 75 mL/hr at 01/08/18 2219  . piperacillin-tazobactam (ZOSYN)  IV 3.375 g (01/09/18 1254)  . vancomycin 750 mg (01/09/18 1057)     LOS: 4 days   Marcellus Scott, MD, FACP, [facility]. [facility] Pager [phone]  If 7PM-7AM, please contact night-coverage [email or web address] Password [email or web address] 01/09/2018, 4:05 PM

## 2018-01-10 DIAGNOSIS — I739 Peripheral vascular disease, unspecified: Secondary | ICD-10-CM

## 2018-01-10 DIAGNOSIS — K219 Gastro-esophageal reflux disease without esophagitis: Secondary | ICD-10-CM

## 2018-01-10 DIAGNOSIS — R079 Chest pain, unspecified: Secondary | ICD-10-CM

## 2018-01-10 LAB — CBC
HEMATOCRIT: 39.7 % (ref 39.0–52.0)
HEMOGLOBIN: 13.2 g/dL (ref 13.0–17.0)
MCH: 33 pg (ref 26.0–34.0)
MCHC: 33.2 g/dL (ref 30.0–36.0)
MCV: 99.3 fL (ref 80.0–100.0)
NRBC: 0 % (ref 0.0–0.2)
Platelets: 207 10*3/uL (ref 150–400)
RBC: 4 MIL/uL — ABNORMAL LOW (ref 4.22–5.81)
RDW: 12.9 % (ref 11.5–15.5)
WBC: 6.7 10*3/uL (ref 4.0–10.5)

## 2018-01-10 LAB — PROTIME-INR
INR: 0.96
Prothrombin Time: 12.7 seconds (ref 11.4–15.2)

## 2018-01-10 MED ORDER — CARVEDILOL 12.5 MG PO TABS: 12.5000 mg | ORAL_TABLET | Freq: Two times a day (BID) | ORAL | 0 refills | Status: DC

## 2018-01-10 MED ORDER — AMOXICILLIN-POT CLAVULANATE 875-125 MG PO TABS: 1.0000 | ORAL_TABLET | Freq: Two times a day (BID) | ORAL | 0 refills | Status: AC

## 2018-01-10 MED ORDER — ROSUVASTATIN CALCIUM 40 MG PO TABS: 40.0000 mg | ORAL_TABLET | Freq: Every day | ORAL | 0 refills | Status: DC

## 2018-01-10 MED ORDER — OXYCODONE HCL 10 MG PO TABS: 10.0000 mg | ORAL_TABLET | ORAL | 0 refills | Status: DC | PRN

## 2018-01-10 MED ORDER — ACETAMINOPHEN 325 MG PO TABS: 625.0000 mg | ORAL_TABLET | Freq: Four times a day (QID) | ORAL | Status: AC | PRN

## 2018-01-10 MED ORDER — OXYCODONE HCL 10 MG PO TABS: 10.0000 mg | ORAL_TABLET | Freq: Four times a day (QID) | ORAL | Status: DC | PRN

## 2018-01-10 NOTE — Discharge Instructions (Addendum)
[facility]  Discharge instructions    Please refer to the following instruction for your post-procedure care. Your surgeon or physician assistant will discuss any changes with you.  Activity  You are encouraged to walk as much as you can. You can slowly return to normal activities during the month after your surgery. Avoid strenuous activity and heavy lifting until your doctor tells you it's OK. Avoid activities such as vacuuming or swinging a golf club. Do not drive until your doctor give the OK and you are no longer taking prescription pain medications. It is also normal to have difficulty with sleep habits, eating and bowel movement after surgery. These will go away with time.  Bathing/Showering  Shower daily after you go home. Do not soak in a bathtub, hot tub, or swim until the incision heals completely.  Incision Care  Clean your incision with mild soap and water. Shower every day. Pat the area dry with a clean towel. You do not need a bandage unless otherwise instructed. Do not apply any ointments or creams to your incision. If you have open wounds you will be instructed how to care for them or a visiting nurse may be arranged for you. If you have staples or sutures along your incision they will be removed at your post-op appointment. You may have skin glue on your incision. Do not peel it off. It will come off on its own in about one week.  Wash the groin wound with soap and water daily and pat dry. (No tub bath-only shower)  Then put a dry gauze or washcloth in the groin to keep this area dry to help prevent wound infection.  Do this daily and as needed.  Do not use Vaseline or neosporin on your incisions.  Only use soap and water on your incisions and then protect and keep dry.  Diet  Resume your normal diet. There are no special food restrictions following this procedure. A low fat/ low cholesterol diet is recommended for all patients with  vascular disease. In order to heal from your surgery, it is CRITICAL to get adequate nutrition. Your body requires vitamins, minerals, and protein. Vegetables are the best source of vitamins and minerals. Vegetables also provide the perfect balance of protein. Processed food has little nutritional value, so try to avoid this.  Medications  Resume taking all your medications unless your doctor or physician assistant tells you not to. If your incision is causing pain, you may take over-the-counter pain relievers such as acetaminophen (Tylenol). If you were prescribed a stronger pain medication, please aware these medication can cause nausea and constipation. Prevent nausea by taking the medication with a snack or meal. Avoid constipation by drinking plenty of fluids and eating foods with high amount of fiber, such as fruits, vegetables, and grains. Take Colace 100 mg (an over-the-counter stool softener) twice a day as needed for constipation.  Do not take Tylenol if you are taking prescription pain medications.  Follow Up  Our office will schedule a follow up appointment 2-3 weeks following discharge.  Please call us immediately for any of the following conditions  Severe or worsening pain in your legs or feet while at rest or while walking Increase pain, redness, warmth, or drainage (pus) from your incision site(s) Fever of 101 degree or higher The swelling in your leg with the bypass suddenly worsens and becomes more painful than when you were in the hospital If you have been instructed to  feel your graft pulse then you should do so every day. If you can no longer feel this pulse, call the office immediately. Not all patients are given this instruction.  Leg swelling is common after leg bypass surgery.  The swelling should improve over a few months following surgery. To improve the swelling, you may elevate your legs above the level of your heart while you are sitting or resting. Your surgeon  or physician assistant may ask you to apply an ACE wrap or wear compression (TED) stockings to help to reduce swelling.  Reduce your risk of vascular disease  Stop smoking. If you would like help call [facility] at 1-800-QUIT-NOW (870-535-2997) or [place] at [phone].  Manage your cholesterol Maintain a desired weight Control your diabetes weight Control your diabetes Keep your blood pressure down  If you have any questions, please call the office at [phone]   Additional discharge instructions  Please get your medications reviewed and adjusted by your Primary MD.  Please request your Primary MD to go over all Hospital Tests and Procedure/Radiological results at the follow up, please get all Hospital records sent to your Prim MD by signing hospital release before you go home.  If you had Pneumonia of Lung problems at the Hospital: Please get a 2 view Chest X ray done in 6-8 weeks after hospital discharge or sooner if instructed by your Primary MD.  If you have Congestive Heart Failure: Please call your Cardiologist or Primary MD anytime you have any of the following symptoms:  1) 3 pound weight gain in 24 hours or 5 pounds in 1 week  2) shortness of breath, with or without a dry hacking cough  3) swelling in the hands, feet or stomach  4) if you have to sleep on extra pillows at night in order to breathe  Follow cardiac low salt diet and 1.5 lit/day fluid restriction.  If you have diabetes Accuchecks 4 times/day, Once in AM empty stomach and then before each meal. Log in all results and show them to your primary doctor at your next visit. If any glucose reading is under 80 or above 300 call your primary MD immediately.  If you have Seizure/Convulsions/Epilepsy: Please do not drive, operate heavy machinery, participate in activities at heights or participate in high speed sports until you have seen by Primary MD or a Neurologist and advised to do so again.  If  you had Gastrointestinal Bleeding: Please ask your Primary MD to check a complete blood count within one week of discharge or at your next visit. Your endoscopic/colonoscopic biopsies that are pending at the time of discharge, will also need to followed by your Primary MD.  Get Medicines reviewed and adjusted. Please take all your medications with you for your next visit with your Primary MD  Please request your Primary MD to go over all hospital tests and procedure/radiological results at the follow up, please ask your Primary MD to get all Hospital records sent to his/her office.  If you experience worsening of your admission symptoms, develop shortness of breath, life threatening emergency, suicidal or homicidal thoughts you must seek medical attention immediately by calling 911 or calling your MD immediately  if symptoms less severe.  You must read complete instructions/literature along with all the possible adverse reactions/side effects for all the Medicines you take and that have been prescribed to you. Take any new Medicines after you have completely understood and accpet all the possible adverse reactions/side effects.   Do  not drive or operate heavy machinery when taking Pain medications.   Do not take more than prescribed Pain, Sleep and Anxiety Medications  Special Instructions: If you have smoked or chewed Tobacco  in the last 2 yrs please stop smoking, stop any regular Alcohol  and or any Recreational drug use.  Wear Seat belts while driving.  Please note You were cared for by a hospitalist during your hospital stay. If you have any questions about your discharge medications or the care you received while you were in the hospital after you are discharged, you can call the unit and asked to speak with the hospitalist on call if the hospitalist that took care of you is not available. Once you are discharged, your primary care physician will handle any further medical issues. Please  note that NO REFILLS for any discharge medications will be authorized once you are discharged, as it is imperative that you return to your primary care physician (or establish a relationship with a primary care physician if you do not have one) for your aftercare needs so that they can reassess your need for medications and monitor your lab values.  You can reach the hospitalist office at phone [phone] or fax [phone]   If you do not have a primary care physician, you can call [phone] for a physician referral.    Pain Medicine Instructions How can pain medicine affect me?  You were given a prescription for pain medicine. This medicine may make you tired or drowsy and may affect your ability to think clearly. Pain medicine may also affect your ability to drive or perform certain physical activities. It may not be possible to make all of your pain go away, but you should be comfortable enough to move, breathe, and take care of yourself. How often should I take pain medicine and how much should I take?  Take pain medicine only as directed by your health care provider and only as needed for pain.  You do not need to take pain medicine if you are not having pain, unless directed by your health care provider.  You can take less than the prescribed dose if you find that a smaller amount of medicine controls your pain. What restrictions do I have while taking pain medicine? Follow these instructions after you start taking pain medicine, while you are taking the medicine, and for 8 hours after you stop taking the medicine:  Do not drive.  Do not operate machinery.  Do not operate power tools.  Do not sign legal documents.  Do not drink alcohol.  Do not take sleeping pills.  Do not supervise children by yourself.  Do not participate in activities that require climbing or being in high places.  Do not enter a body of water--such as a lake, river, ocean, spa, or swimming  pool--without an adult nearby who can monitor and help you.  How can I keep others safe while I am taking pain medicine?  Store your pain medicine as directed by your health care provider. Make sure that it is placed where children and pets cannot reach it.  Never share your pain medicine with anyone.  Do not save any leftover pills. If you have any leftover pain medicine, get rid of it or destroy it as directed by your health care provider. What else do I need to know about taking pain medicine?  Use a stool softener if you become constipated from your pain medicine. Increasing your intake of fruits and  vegetables will also help with constipation.  Write down the times when you take your pain medicine. Look at the times before you take your next dose of medicine. It is easy to become confused while on pain medicine. Recording the times helps you to avoid an overdose.  If your pain is severe, do not try to treat it yourself by taking more pills than instructed on your prescription. Contact your health care provider for help.  You may have been prescribed a pain medicine that contains acetaminophen. Do not take any other acetaminophen while taking this medicine. An overdose of acetaminophen can result in severe liver damage. Acetaminophen is found in many over-the-counter (OTC) and prescription medicines. If you are taking any medicines in addition to your pain medicine, check the active ingredients on those medicines to see if acetaminophen is listed. When should I call my health care provider?  Your medicine is not helping to make the pain go away.  You vomit or have diarrhea shortly after taking the medicine.  You develop new pain in areas that did not hurt before.  You have an allergic reaction to your medicine. This may include: ? Itchiness. ? Swelling. ? Dizziness. ? Developing a new rash. When should I call 911 or go to the emergency room?  You feel dizzy or you faint.  You  are very confused or disoriented.  You repeatedly vomit.  Your skin or lips turn pale or bluish in color.  You have shortness of breath or you are breathing much more slowly than usual.  You have a severe allergic reaction to your medicine. This includes: ? Developing tongue swelling. ? Having difficulty breathing. This information is not intended to replace advice given to you by your health care provider. Make sure you discuss any questions you have with your health care provider. Document Released: 06/04/2000 Document Revised: 09/16/2015 Document Reviewed: 12/31/2013 Elsevier Interactive Patient Education  2018 [employer].

## 2018-01-10 NOTE — Progress Notes (Signed)
    Subjective  - POD #3  Still with pain in right groin   Physical Exam:  Incision intact with eryhtema    Cx + staph  Assessment/Plan:  POD #3  Recommend PO abx x 1 more week Office f/u in 2 weeks Ok for discharge   Douglas [name] 01/10/2018 11:05 AM --  Vitals:   01/09/18 2100 01/10/18 0423  BP: (!) 161/95   Pulse: 66 72  Resp: 16 18  Temp: 98.2 F (36.8 C) 97.9 F (36.6 C)  SpO2: 99% 98%    Intake/Output Summary (Last 24 hours) at 01/10/2018 1105 Last data filed at 01/09/2018 1324 Gross per 24 hour  Intake -  Output 700 ml  Net -700 ml     Laboratory CBC    Component Value Date/Time   WBC 6.7 01/10/2018 0941   HGB 13.2 01/10/2018 0941   HCT 39.7 01/10/2018 0941   HCT 45.1 04/08/2015 0350   PLT 207 01/10/2018 0941    BMET    Component Value Date/Time   NA 137 01/08/2018 0332   K 3.9 01/08/2018 0332   CL 102 01/08/2018 0332   CO2 28 01/08/2018 0332   GLUCOSE 166 (H) 01/08/2018 0332   BUN <5 (L) 01/08/2018 0332   CREATININE 1.00 01/08/2018 0332   CALCIUM 8.2 (L) 01/08/2018 0332   GFRNONAA >60 01/08/2018 0332   GFRAA >60 01/08/2018 0332    COAG Lab Results  Component Value Date   INR 0.96 01/10/2018   INR 0.88 04/11/2017   INR 1.18 03/19/2016   No results found for: PTT  Antibiotics Anti-infectives (From admission, onward)   Start     Dose/Rate Route Frequency Ordered Stop   01/09/18 2200  amoxicillin-clavulanate (AUGMENTIN) 875-125 MG per tablet 1 tablet     1 tablet Oral Every 12 hours 01/09/18 1618 01/13/18 2359   01/05/18 1000  vancomycin (VANCOCIN) IVPB 750 mg/150 ml premix  Status:  Discontinued     750 mg 150 mL/hr over 60 Minutes Intravenous Every 12 hours 01/04/18 2129 01/09/18 1618   01/05/18 0500  piperacillin-tazobactam (ZOSYN) IVPB 3.375 g  Status:  Discontinued     3.375 g 12.5 mL/hr over 240 Minutes Intravenous Every 8 hours 01/04/18 2129 01/09/18 1618   01/04/18 2130  vancomycin (VANCOCIN) 1,250 mg in sodium  chloride 0.9 % 250 mL IVPB     1,250 mg 166.7 mL/hr over 90 Minutes Intravenous  Once 01/04/18 2125 01/04/18 2351   01/04/18 2045  vancomycin (VANCOCIN) IVPB 1000 mg/200 mL premix  Status:  Discontinued     1,000 mg 200 mL/hr over 60 Minutes Intravenous  Once 01/04/18 2038 01/04/18 2125   01/04/18 2045  piperacillin-tazobactam (ZOSYN) IVPB 3.375 g     3.375 g 100 mL/hr over 30 Minutes Intravenous  Once 01/04/18 2038 01/04/18 2144       V. Charlena Cross, M.D. [facility] Office: [phone] Pager:  [phone]

## 2018-01-10 NOTE — Progress Notes (Signed)
Douglas [name] to be D/C'd Home per MD order. Discussed with the patient and all questions fully answered.    IV catheter discontinued intact. Site without signs and symptoms of complications. Dressing and pressure applied.  An After Visit Summary was printed and given to the patient.  Patient escorted via WC, and D/C home via private auto.  [name]  01/10/2018 5:00 PM

## 2018-01-10 NOTE — Discharge Summary (Addendum)
Physician Discharge Summary  LORIK GUO ZOX:[id number] DOB: 04-Nov-1965  PCP: Patient, No Pcp Per  Admit date: 01/04/2018 Discharge date: 01/10/2018  Recommendations for Outpatient Follow-up:  1. [facility]/new PCP on 01/21/2018 at 9:20 AM.  To be seen with repeat labs (CBC & BMP). 2. Dr. Coral Else, VVS in 2 weeks.  Office will call patient to arrange appointment. 3. Dr.William S. Donnie Aho, Cardiology 4. Consider checking LFTs in about 4 weeks.  Home Health: None Equipment/Devices: None  Discharge Condition: Improved and stable CODE STATUS: Full Diet recommendation: Heart healthy diet.  Discharge Diagnoses:  Principal Problem:   Cellulitis Active Problems:   CAD (coronary artery disease), native coronary artery   Chest pain   PVD (peripheral vascular disease) (HCC)   HTN (hypertension)   HLD (hyperlipidemia)   GERD (gastroesophageal reflux disease)   Sepsis (HCC)   Brief Summary: LAJARVIS ITALIANO is an 52 y.o. male with medical history significant ofcoronary artery disease status post CABGand stents, peripheral vascular disease, hyperlipidemia presenting to the hospital for evaluation of chest pain, shortness of breath, and chills. Patient had a left iliac stent placed on October 22 by vascular surgery. States since day prior to admission his right groin appeared red swollen. Area was painful.  He was admitted for suspected cellulitis of his right groin, treated with IV antibiotics and had vascular surgery 10/29.  Assessment and plan:  Cellulitis of the right groin - s/p left external iliac stent placed on 10/22 and the access site was the right femoral artery - Right lower extremity arterial Doppler 10/26: Right groin appears negative for pseudoaneurysm.  Edematous tissue noted, focal area of heterogenicity approximately 1.5 x 1.5 cm near area of redness and swelling. -Treated empirically with IV vancomycin and Zosyn and completed  approximately 6 days. - Blood cultures x2 from 10/26: Negative, final report.  Urine culture negative.  Right groin operative culture from 10/29 x 2: Negative to date. - Vascular surgery was consulted.  CTA of abdomen and pelvis 10/28 showed findings consistent with right groin hematoma versus cellulitis with phlegmon but no evidence of pseudoaneurysm or defined abscess. He then underwent exploration of right groin and repair of right common femoral artery on 10/29.  As per my discussion with Dr. Edilia Bo on 10/31, patient had no abscess, possibly had right groin cellulitis and he appeared to have had a reaction to the Minx closure device.  - Staph aureus was from nasal swabs. Transitioned to oral Augmentin on 10/31. -Still has some right groin pain but controlled on oral medications.  Has not used IV pain medicine since yesterday.  Vascular surgery follow-up appreciated.  I discussed with Dr. Myra Gianotti.  He recommends additional 7 days of Augmentin and cleared for discharge home with outpatient follow-up with them in 2 weeks. - Improved and stable.  -Reviewed [facility] controlled substance database.  Patient was last prescribed 6 tablets of hydrocodone-acetaminophen/5-325 mg on 08/21/2017 which he reports that he has completed and does not have any at home.  Counseled extensively regarding appropriate use of opioids, side effects and risks.  He verbalizes understanding.  Addendum  Based on his opioid pain medication use and symptom relief in the hospital, he was initially prescribed OxyIR 10 mg every 4 hourly as needed at discharge.  However his [facility] pharmacy refused to fill this based on their set criteria and hence I discussed with the pharmacist at [facility] Ms. Beatrice and changed OxyIR to 10 mg every 6 hourly as needed (  20 tabs without refills) which she indicated will be filled.  Patient updated.  Chest pain w/ h/o CAD s/p CABG and stents -Cardiology was consulted for assistance.  Troponins  negative.  CTA chest 01/04/2018 negative for PE, thoracic aortic aneurysm or dissection.  There was no edema or consolidation.  EKG nonacute.  Cardiology opined that his chest pain did not seem anginal in nature, has been present ever since CABG, noted to be positional and chest wall pain, worse with certain movements, does not worsen with exertion.  He has had an echocardiogram recently that did not show significant evidence of constrictive physiology from chronic pericarditis.  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  He was continued on aspirin and Plavix, beta-blockers were switched from metoprolol to carvedilol and rosuvastatin was resumed.  Patient has not complained of chest pain in the last couple of days.  Outpatient follow-up with primary cardiologist.  Peripheral vascular disease -on ASA/plavix.  Resumed rosuvastatin. -CTA abdomen and pelvis 01/06/2018 showed extensive heterogenous atherosclerotic plaque throughout the aorta and branch arteries resulting in a moderate focal stenosis of the origin of the celiac axis and a high-grade stenosis of the origin of the inferior mesenteric artery.  No ischemic type of abdominal pain or rectal bleeding but if he develops any of the symptoms, may need further evaluation. -CTA abdomen and pelvis also showed moderate focal stenosis of the proximal right external artery and widely patent left external iliac artery stent: Outpatient follow-up with vascular surgery.  Hypertension  BPs were soft at one time and amlodipine was discontinued.  Metoprolol was changed to carvedilol.  Improved blood pressure.  Close outpatient follow-up and may consider further adjustment of medications as needed, if BP remains elevated, may consider resuming amlodipine.  Hyperlipidemia LDL in January 2018 was 89.  Was not taking statins at home, resumed in the hospital especially given her  recent stent for PAD and prior history of CAD, CABG and PCI's.  Consider checking LFTs in approximately 4 weeks.  GERD -And has long-standing history of heartburns.  Discontinued BC powder.  Continue twice daily Pepcid rather than as needed.  Outpatient follow-up with PCP.  Calcified right upper lobe granuloma -Noted on CTA chest 01/04/2018.  Also noted were areas of scattered bullae as well as bibasilar atelectasis.  Outpatient follow-up as deemed necessary.  Right-sided chest wall bruising Unclear etiology.  May be related to scratching in the context of aspirin, Plavix and DVT prophylactic dose Lovenox.  Not worsening compared to yesterday.  Hemoglobin stable.  Close outpatient follow-up.  Patient advised to seek immediate medical attention if he notices worsening.  He verbalized understanding.   Consultations:  Cardiology  Vascular surgery  Procedures: Exploration of right groin and repair of right common femoral artery on 10/29.   Discharge Instructions  Discharge Instructions    Call MD for:  difficulty breathing, headache or visual disturbances   Complete by:  As directed    Call MD for:  extreme fatigue   Complete by:  As directed    Call MD for:  persistant dizziness or light-headedness   Complete by:  As directed    Call MD for:  persistant nausea and vomiting   Complete by:  As directed    Call MD for:  redness, tenderness, or signs of infection (pain, swelling, redness, odor or green/yellow discharge around incision site)   Complete by:  As directed    Call MD  for:  severe uncontrolled pain   Complete by:  As directed    Call MD for:  temperature >100.4   Complete by:  As directed    Diet - low sodium heart healthy   Complete by:  As directed    Increase activity slowly   Complete by:  As directed        Medication List    STOP taking these medications   amLODipine 10 MG tablet Commonly known as:  NORVASC   BC HEADACHE POWDER PO   metoprolol tartrate  25 MG tablet Commonly known as:  LOPRESSOR     TAKE these medications   acetaminophen 325 MG tablet Commonly known as:  TYLENOL Take 2 tablets (650 mg total) by mouth every 6 (six) hours as needed for mild pain, fever or headache.   amoxicillin-clavulanate 875-125 MG tablet Commonly known as:  AUGMENTIN Take 1 tablet by mouth 2 (two) times daily for 7 days.   aspirin EC 81 MG tablet Take 81 mg by mouth daily.   carvedilol 12.5 MG tablet Commonly known as:  COREG Take 1 tablet (12.5 mg total) by mouth 2 (two) times daily with a meal.   clopidogrel 75 MG tablet Commonly known as:  PLAVIX Take 1 tablet (75 mg total) by mouth daily.   famotidine 20 MG tablet Commonly known as:  PEPCID Take 1 tablet (20 mg total) by mouth 2 (two) times daily. What changed:    when to take this  reasons to take this   nitroGLYCERIN 0.4 MG SL tablet Commonly known as:  NITROSTAT Place 1 tablet (0.4 mg total) under the tongue every 5 (five) minutes as needed for chest pain.   Oxycodone HCl 10 MG Tabs Take 1 tablet (10 mg total) by mouth every 6 (six) hours as needed (Moderate or severe pain.).   rosuvastatin 40 MG tablet Commonly known as:  CRESTOR Take 1 tablet (40 mg total) by mouth daily at 6 PM.      Follow-up Information    Nada Libman, MD In 2 weeks.   Specialties:  Vascular Surgery, Cardiology Why:  Office will call you to arrange your appt (sent) Contact information: [address] [place] [address] [phone]        [facility]. Go on 01/21/2018.   Specialty:  Internal Medicine Why:  hospital follow up appointment at 9:20 (can establish PCP at that time)- please arrive 15 min priorl to appointment and bring ID and medication list.  To be seen with repeat labs (CBC & BMP). Contact information: [address] [place] [address] [phone]       Othella Boyer, MD. Schedule an appointment as soon as possible for a  visit.   Specialty:  Cardiology Contact information: [address] [place] [address] [place] [address] [phone]          Allergies  Allergen Reactions  . Ibuprofen Nausea And Vomiting    UPSETS STOMACH  . Shellfish Allergy Nausea And Vomiting      Procedures/Studies: Ct Angio Chest Pe W And/or Wo Contrast  Result Date: 01/04/2018 CLINICAL DATA:  Chest pain EXAM: CT ANGIOGRAPHY CHEST WITH CONTRAST TECHNIQUE: Multidetector CT imaging of the chest was performed using the standard protocol during bolus administration of intravenous contrast. Multiplanar CT image reconstructions and MIPs were obtained to evaluate the vascular anatomy. CONTRAST:  62 mL ISOVUE-370 IOPAMIDOL (ISOVUE-370) INJECTION 76% COMPARISON:  Chest CT April 10, 2016; chest radiograph  November 04, 2017 FINDINGS: Cardiovascular: There is no demonstrable pulmonary embolus. There is no thoracic aortic aneurysm or dissection. The visualized great vessels appear unremarkable except for slight calcification in the left subclavian artery. There is aortic atherosclerosis. There are foci of native coronary artery calcification. The patient is status post coronary artery bypass grafting. There is no pericardial effusion or pericardial thickening. Mediastinum/Nodes: Visualized thyroid appears normal. There are subcentimeter mediastinal lymph nodes present. By size criteria, there is no evident thoracic adenopathy. There is a small hiatal hernia. Lungs/Pleura: There are scattered small bullae in the lungs bilaterally. There is bibasilar atelectasis. There is no edema or consolidation. There is a calcified granuloma in the posterior segment of the right upper lobe. No evident pleural thickening or pleural effusion. Upper Abdomen: Visualized adrenal show evidence of a degree of a hypertrophy, stable. Visualized upper abdominal structures otherwise appear unremarkable. Musculoskeletal: Patient is status post median sternotomy. There are  no blastic or lytic bone lesions. No chest wall lesions are apparent. Review of the MIP images confirms the above findings. IMPRESSION: 1. No demonstrable pulmonary embolus. No thoracic aortic aneurysm or dissection. Patient is status post coronary artery bypass grafting. There is aortic atherosclerosis. 2. Areas of scattered bullae as well as bibasilar atelectasis. Calcified granuloma right upper lobe. No edema or consolidation. 3.  No thoracic adenopathy by size criteria. 4.  Small hiatal hernia. 5.  Stable mild adrenal hypertrophy bilaterally. Aortic Atherosclerosis (ICD10-I70.0). Electronically Signed   By: Bretta Bang III M.D.   On: 01/04/2018 19:27   Vas [place] Lower Extremity Arterial Duplex  Result Date: 01/05/2018 LOWER EXTREMITY ARTERIAL DUPLEX STUDY  Current ABI: Pseudo check Performing Technologist: Levada Schilling RDMS, RVT  Examination Guidelines: A complete evaluation includes B-mode imaging, spectral Doppler, color Doppler, and power Doppler as needed of all accessible portions of each vessel. Bilateral testing is considered an integral part of a complete examination. Limited examinations for reoccurring indications may be performed as noted.  Summary: Right: Right groin appears negative for a pseudo aneurysm. Edematous tissue noted, focal area of heterogenecity aprox 1.5 x 1.5cm near area of reddness and swelling, etiology unknown.  See table(s) above for measurements and observations. Electronically signed by Sherald Hess MD on 01/05/2018 at 4:02:17 PM.    Final    Ct Angio Abd/pel W/ And/or W/o  Result Date: 01/06/2018 CLINICAL DATA:  51 year old male with right groin swelling status post cardiac catheterization last week. Evaluate for pseudoaneurysm. EXAM: CT ANGIOGRAPHY ABDOMEN AND PELVIS WITH CONTRAST AND WITHOUT CONTRAST TECHNIQUE: Multidetector CT imaging of the abdomen and pelvis was performed using the standard protocol during bolus administration of intravenous contrast.  Multiplanar reconstructed images and MIPs were obtained and reviewed to evaluate the vascular anatomy. CONTRAST:  100 mL Isovue 370 COMPARISON:  Prior CT scan of the abdomen and pelvis 04/10/2017 FINDINGS: VASCULAR Aorta: Moderate heterogeneous atherosclerotic plaque without evidence of aneurysm, dissection or penetrating ulceration. 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 No aneurysm or dissection. SMA: Mild fibrofatty atherosclerotic plaque in the proximal SMA resulting in perhaps mild stenosis. Renals: Single renal arteries bilaterally. No evidence of aneurysm, dissection or fibromuscular dysplasia. No significant stenosis on the right. Mild stenosis secondary to mixed atherosclerotic plaque on the left. IMA: High-grade stenosis at the origin of the inferior mesenteric artery. Inflow: Heterogeneous atherosclerotic plaque throughout both common iliac arteries without significant stenosis. The origins of the internal iliac  arteries are stenosed bilaterally. There is a focal moderate stenosis in the proximal right external iliac artery. Left external iliac artery stent is widely patent. Proximal Outflow: Heterogeneous atherosclerotic plaque is present along the posterior wall of the common femoral arteries bilaterally. Slightly high bifurcation on the left. No evidence of pseudoaneurysm. The visualized portions of the bilateral profunda and superficial femoral arteries are patent. Veins: No focal venous abnormality. Review of the MIP images confirms the above findings. NON-VASCULAR Lower chest: Calcifications visualized in the coronary arteries. The heart is normal in size. No pericardial effusion. The lungs are clear. Minimal dependent atelectasis. Surgical changes of prior median sternotomy incompletely imaged. Hepatobiliary: Normal hepatic contour and morphology. No discrete hepatic  lesions. Normal appearance of the gallbladder. No intra or extrahepatic biliary ductal dilatation. Pancreas: Unremarkable. No pancreatic ductal dilatation or surrounding inflammatory changes. Spleen: Normal in size without focal abnormality. Adrenals/Urinary Tract: Adrenal glands are unremarkable. Kidneys are normal, without renal calculi, focal lesion, or hydronephrosis. Bladder is unremarkable. Stomach/Bowel: No evidence of obstruction or focal bowel wall thickening. Normal appendix in the right lower quadrant. The terminal ileum is unremarkable. Lymphatic: No suspicious lymphadenopathy. Reproductive: Prostate is unremarkable. Other: Small fat containing umbilical hernia. Trace free fluid present in the anatomic pelvis which is abnormal but nonspecific. Heterogeneous region of intermediate attenuation in the superficial subcutaneous fat anterior to the right common femoral artery and vein likely representing hematoma. There is slight thickening of the overlying skin. Musculoskeletal: No acute fracture or aggressive appearing lytic or blastic osseous lesion. Chronic left-sided pars defect at L5. IMPRESSION: VASCULAR 1. Heterogeneous and amorphous intermediate density in the superficial subcutaneous fat overlying the right common femoral artery with thickening of the adjacent skin. Given history of recent groin puncture, this almost certainly represents a small amount of hematoma. In the appropriate clinical setting, cellulitis with phlegmon could appear similar. There is no evidence of arterial pseudoaneurysm or defined abscess. 2. Extensive heterogeneous atherosclerotic plaque throughout the aorta and branch arteries resulting in a moderate focal stenosis of the origin of the celiac axis and a high-grade stenosis of the origin of the inferior mesenteric artery. 3. Moderate focal stenosis of the proximal right external iliac artery. 4. Widely patent left external iliac artery stent. 5. Coronary artery  calcifications. NON-VASCULAR 1. Small volume free fluid in the anatomic pelvis is abnormal in a male but nonspecific. This can occasionally be seen in the setting of an infectious or inflammatory process involving the colon or small bowel which is otherwise occult by imaging. 2. Small fat containing umbilical hernia. 3. Chronic left L5 pars defect. Aortic Atherosclerosis (ICD10-170.0) Signed, Sterling Big, MD, RPVI [facility] Electronically Signed   By: Malachy Moan M.D.   On: 01/06/2018 11:39      Subjective: Overall feels better.  Still reports moderate right groin pain but controlled on pain medications and able to ambulate without much discomfort.  No nausea or vomiting.  Tolerating diet.  No fever or chills.  Discharge Exam:  Vitals:   01/09/18 0409 01/09/18 1324 01/09/18 2100 01/10/18 0423  BP: (!) 142/81 123/70 (!) 161/95   Pulse: 78 67 66 72  Resp:  19 16 18   Temp: 97.8 F (36.6 C) 97.8 F (36.6 C) 98.2 F (36.8 C) 97.9 F (36.6 C)  TempSrc: Oral Oral Oral Oral  SpO2: 99% 99% 99% 98%    General exam: Pleasant young male, moderately built and nourished lying comfortably propped up in bed.  Respiratory system: Clear to auscultation.  No increased work of breathing.   Cardiovascular system: S1 and S2 heard, RRR.  No JVD, murmurs or pedal edema.  Gastrointestinal system: Abdomen is nondistended, soft and nontender.  Normal bowel sounds heard.   CNS: Alert and oriented.  No focal neurological deficits. Extremities: Symmetric 5 x 5 power.    Right groin with mild tenderness, swelling but no significant erythema.  No fluctuation. Skin: Patchy areas of bruising noted over right anterior lateral chest wall up to axilla, patient.  Few scratch-like marks noted.  No new lesions or worsening.    The results of significant diagnostics from this hospitalization (including imaging, microbiology, ancillary and  laboratory) are listed below for reference.     Microbiology: Recent Results (from the past 240 hour(s))  Blood culture (routine x 2)     Status: None   Collection Time: 01/04/18  4:49 PM  Result Value Ref Range Status   Specimen Description BLOOD RIGHT ANTECUBITAL  Final   Special Requests   Final    BOTTLES DRAWN AEROBIC AND ANAEROBIC Blood Culture adequate volume   Culture   Final    NO GROWTH 5 DAYS Performed at [facility], [address], [place], [place] [address]    Report Status 01/09/2018 FINAL  Final  Blood culture (routine x 2)     Status: None   Collection Time: 01/04/18  5:33 PM  Result Value Ref Range Status   Specimen Description BLOOD BLOOD RIGHT FOREARM  Final   Special Requests   Final    BOTTLES DRAWN AEROBIC AND ANAEROBIC Blood Culture adequate volume   Culture   Final    NO GROWTH 5 DAYS Performed at [facility], [address] [place], [place] [address]    Report Status 01/09/2018 FINAL  Final  Urine culture     Status: None   Collection Time: 01/04/18 10:35 PM  Result Value Ref Range Status   Specimen Description URINE, CLEAN CATCH  Final   Special Requests NONE  Final   Culture   Final    NO GROWTH Performed at [facility], [address] [place], [place] [address]    Report Status 01/06/2018 FINAL  Final  Surgical pcr screen     Status: Abnormal   Collection Time: 01/07/18  3:51 AM  Result Value Ref Range Status   MRSA, PCR NEGATIVE NEGATIVE Final   Staphylococcus aureus POSITIVE (A) NEGATIVE Final    Comment: (NOTE) The Xpert SA Assay (FDA approved for NASAL specimens in patients 63 years of age and older), is one component of a comprehensive surveillance program. It is not intended to diagnose infection nor to guide or monitor treatment.   Aerobic/Anaerobic Culture (surgical/deep wound)     Status: None (Preliminary result)   Collection Time: 01/07/18  2:07 PM  Result Value Ref Range Status   Specimen Description  GROIN RIGHT ABSCESS  Final   Special Requests NONE  Final   Gram Stain   Final    FEW WBC PRESENT, PREDOMINANTLY PMN NO ORGANISMS SEEN    Culture   Final    NO GROWTH 3 DAYS NO ANAEROBES ISOLATED; CULTURE IN PROGRESS FOR 5 DAYS Performed at [facility], [address], [place], [place] [address]    Report Status PENDING  Incomplete  Aerobic/Anaerobic Culture (surgical/deep wound)     Status: None (Preliminary result)   Collection Time: 01/07/18  2:11 PM  Result Value Ref  Range Status   Specimen Description ABSCESS GROIN RIGHT  Final   Special Requests NONE  Final   Gram Stain   Final    MODERATE WBC PRESENT, PREDOMINANTLY PMN NO ORGANISMS SEEN    Culture   Final    NO GROWTH 3 DAYS NO ANAEROBES ISOLATED; CULTURE IN PROGRESS FOR 5 DAYS Performed at [facility], [address], [place], [place] [address]    Report Status PENDING  Incomplete     Labs: CBC: Recent Labs  Lab 01/04/18 1642 01/06/18 0349 01/07/18 0231 01/08/18 0332 01/10/18 0941  WBC 10.1 6.4 5.7 11.1* 6.7  HGB 16.7 14.2 13.5 14.1 13.2  HCT 51.1 43.7 42.1 43.0 39.7  MCV 99.0 99.8 99.8 99.1 99.3  PLT 314 236 244 247 207   Basic Metabolic Panel: Recent Labs  Lab 01/04/18 1642 01/06/18 0349 01/07/18 0231 01/08/18 0332  NA 136 137 137 137  K 4.0 3.9 3.9 3.9  CL 105 107 108 102  CO2 21* 25 21* 28  GLUCOSE 85 93 115* 166*  BUN 7 <5* <5* <5*  CREATININE 1.05 1.05 1.04 1.00  CALCIUM 8.5* 8.1* 7.8* 8.2*   Liver Function Tests: Recent Labs  Lab 01/04/18 1642  AST 24  ALT 14  ALKPHOS 96  BILITOT 0.2*  PROT 5.9*  ALBUMIN 3.3*   Cardiac Enzymes: Recent Labs  Lab 01/04/18 2202 01/05/18 0354 01/05/18 0935  TROPONINI <0.03 <0.03 <0.03    Urinalysis    Component Value Date/Time   COLORURINE STRAW (A) 01/04/2018 2235   APPEARANCEUR CLEAR 01/04/2018 2235   LABSPEC 1.025 01/04/2018 2235   PHURINE 5.0 01/04/2018 2235   GLUCOSEU NEGATIVE 01/04/2018 2235   HGBUR NEGATIVE  01/04/2018 2235   BILIRUBINUR NEGATIVE 01/04/2018 2235   KETONESUR NEGATIVE 01/04/2018 2235   PROTEINUR NEGATIVE 01/04/2018 2235   UROBILINOGEN 1.0 01/15/2013 1135   NITRITE NEGATIVE 01/04/2018 2235   LEUKOCYTESUR NEGATIVE 01/04/2018 2235      Time coordinating discharge: 40 minutes  SIGNED:  Marcellus Scott, MD, FACP, [facility]. [facility] Pager [phone]  If 7PM-7AM, please contact night-coverage [email or web address] Password [email or web address] 01/10/2018, 4:44 PM

## 2018-01-10 NOTE — Care Management Note (Signed)
Case Management Note Donn Pierini RN, BSN Transitions of Care Unit 4E- RN Case Manager [phone]  Patient Details  Name: Douglas [name] MRN: [id number] Date of Birth: 12/24/1965  Subjective/Objective: Pt admitted with cellulitis/abscess s/p I&D                    Action/Plan: PTA pt lived at home, referral for PCP needs- spoke with pt at bedside pt agreeable to appointment at [facility] clinic- call made to [facility] for f/u appointment - first available for Nov. 12 at 920- info provided to pt and placed on AVS, also discussed medication needs- per pt he feels like he will be able to afford abx and meds states his Dad will assist- scripts have been sent to [facility].   Expected Discharge Date:  01/10/18               Expected Discharge Plan:  Home/Self Care  In-House Referral:  NA  Discharge planning Services  CM Consult, Indigent Health Clinic, Follow-up appt scheduled, Medication Assistance  Post Acute Care Choice:    Choice offered to:     DME Arranged:    DME Agency:     HH Arranged:    HH Agency:     Status of Service:  Completed, signed off  If discussed at [employer] of Stay Meetings, dates discussed:    Discharge Disposition: Home/self care    Additional Comments:  Darrold Span, RN 01/10/2018, 3:17 PM

## 2018-01-12 LAB — AEROBIC/ANAEROBIC CULTURE (SURGICAL/DEEP WOUND): CULTURE: NO GROWTH

## 2018-01-12 LAB — AEROBIC/ANAEROBIC CULTURE W GRAM STAIN (SURGICAL/DEEP WOUND): Culture: NO GROWTH

## 2018-01-14 ENCOUNTER — Telehealth: Payer: Self-pay | Admitting: Surgery

## 2018-01-14 NOTE — Telephone Encounter (Signed)
-----   Message from Sharee Pimple, RN sent at 01/08/2018  8:56 AM EDT ----- Regarding: reschedule appt   ----- Message ----- From: Garth Schlatter Sent: 01/08/2018   8:47 AM EDT To: [facility] Admin Pool, [facility] Charge Pool  This pt has f/u with Rosalita Chessman at the end of November.  Dr. Edilia Bo wants this pt seen in a couple of weeks by Dr. Myra Gianotti (MD only) with the studies he was going to have in 4 weeks.   He is s/p right groin exploration on 01/07/18 by Dr. Edilia Bo but to be seen by Dr. Myra Gianotti (MD only) in a couple of weeks per Dr. Edilia Bo.  Thanks

## 2018-01-14 NOTE — Telephone Encounter (Signed)
sch appt lvm 01/20/18 9am Aorta/ivc 10am ABI 1045am p/o MD

## 2018-01-20 ENCOUNTER — Encounter: Payer: Self-pay | Admitting: Surgery

## 2018-01-20 ENCOUNTER — Ambulatory Visit (INDEPENDENT_AMBULATORY_CARE_PROVIDER_SITE_OTHER)
Admission: RE | Admit: 2018-01-20 | Discharge: 2018-01-20 | Disposition: A | Discharge status: Home / Self Care | Payer: Self-pay | Source: Ambulatory Visit | Attending: Surgery | Admitting: Surgery

## 2018-01-20 ENCOUNTER — Other Ambulatory Visit: Payer: Self-pay

## 2018-01-20 ENCOUNTER — Ambulatory Visit (INDEPENDENT_AMBULATORY_CARE_PROVIDER_SITE_OTHER): Payer: Self-pay | Admitting: Surgery

## 2018-01-20 ENCOUNTER — Ambulatory Visit (HOSPITAL_COMMUNITY)
Admission: RE | Admit: 2018-01-20 | Discharge: 2018-01-20 | Disposition: A | Discharge status: Home / Self Care | Payer: Self-pay | Source: Ambulatory Visit | Attending: Surgery | Admitting: Surgery

## 2018-01-20 VITALS — BP 138/95 | HR 88 | Temp 97.3°F | Resp 18 | Ht 71.0 in | Wt 148.0 lb

## 2018-01-20 DIAGNOSIS — I739 Peripheral vascular disease, unspecified: Secondary | ICD-10-CM

## 2018-01-20 NOTE — Progress Notes (Signed)
Patient name: Douglas [name] MRN: [id number] DOB: 05/14/1965 Sex: male  REASON FOR VISIT:     follow up  HISTORY OF PRESENT ILLNESS:   Douglas [name] is a 52 y.o. male who initially presented to the hospital in August 2019 with chest pain.  He has a history of CABG in 2018.  At that time he was diagnosed with unstable angina and underwent cardiac catheterization.  He was also found to have significant left iliac stenosis.  Clinically he has been having leg pain for about 4 months, only being able to ambulate approximately 100 feet.  On 12/31/2017 he underwent angiography.  A 80% left external iliac artery stenosis was identified and stented.  He was also found to have a 50% left superficial femoral artery stenosis which was not treated.  He was discharged home.  He represented several days later with swelling and redness in his right groin.  This persisted despite antibiotics and so he went to the operating room for exploration.  It appeared that he had a reaction to the Mynx closure device.  Intraoperative cultures grew out staph aureus.  He was ultimately discharged home on antibiotics.    Patient is currently smoking.  He is on a statin for hypercholesterolemia.  He is on dual antiplatelet therapy.  He has a history of CABG as well as coronary stents.   CURRENT MEDICATIONS:    Current Outpatient Medications  Medication Sig Dispense Refill  . acetaminophen (TYLENOL) 325 MG tablet Take 2 tablets (650 mg total) by mouth every 6 (six) hours as needed for mild pain, fever or headache.    Marland Kitchen aspirin EC 81 MG tablet Take 81 mg by mouth daily.    . carvedilol (COREG) 12.5 MG tablet Take 1 tablet (12.5 mg total) by mouth 2 (two) times daily with a meal. 60 tablet 0  . clopidogrel (PLAVIX) 75 MG tablet Take 1 tablet (75 mg total) by mouth daily. 30 tablet 0  . famotidine (PEPCID) 20 MG tablet Take 1 tablet (20 mg total) by mouth 2 (two) times daily.  (Patient taking differently: Take 20 mg by mouth daily as needed for heartburn. ) 60 tablet 0  . nitroGLYCERIN (NITROSTAT) 0.4 MG SL tablet Place 1 tablet (0.4 mg total) under the tongue every 5 (five) minutes as needed for chest pain. 25 tablet 0  . rosuvastatin (CRESTOR) 40 MG tablet Take 1 tablet (40 mg total) by mouth daily at 6 PM. 30 tablet 0  . Oxycodone HCl 10 MG TABS Take 1 tablet (10 mg total) by mouth every 6 (six) hours as needed (Moderate or severe pain.). (Patient not taking: Reported on 01/20/2018)     No current facility-administered medications for this visit.     REVIEW OF SYSTEMS:   [X]  denotes positive finding, [ ]  denotes negative finding Cardiac  Comments:  Chest pain or chest pressure:    Shortness of breath upon exertion:    Short of breath when lying flat:    Irregular heart rhythm:    Constitutional    Fever or chills:      PHYSICAL EXAM:   Vitals:   01/20/18 1008  BP: (!) 138/95  Pulse: 88  Resp: 18  Temp: (!) 97.3 F (36.3 C)  TempSrc: Oral  SpO2: 99%  Weight: 148 lb (67.1 kg)  Height: 5\' 11"  (1.803 m)    GENERAL: The patient is a well-nourished male, in no acute distress. The vital signs are documented above. CARDIOVASCULAR: There is  a regular rate and rhythm. PULMONARY: Non-labored respirations Right groin incision is healed nicely.  There is no drainage or erythema  STUDIES:   +-------+-----------+-----------+------------+------------+ ABI/TBIToday's ABIToday's TBIPrevious ABIPrevious TBI +-------+-----------+-----------+------------+------------+ Right 1.1    0.68                 +-------+-----------+-----------+------------+------------+ Left  1.0    0.97                 +-------+-----------+-----------+------------+------------+  Left iliac stent is widely patent  MEDICAL ISSUES:   Patient will follow-up in 3 months with ABIs and duplex.  [name], MD [facility] [phone] Pager [phone]

## 2018-01-21 ENCOUNTER — Ambulatory Visit: Payer: Self-pay | Admitting: Family Medicine

## 2018-02-03 ENCOUNTER — Encounter (HOSPITAL_COMMUNITY): Payer: Self-pay

## 2018-02-03 ENCOUNTER — Encounter: Payer: Self-pay | Admitting: Family

## 2018-03-14 ENCOUNTER — Other Ambulatory Visit: Payer: Self-pay | Admitting: *Deleted

## 2018-03-14 ENCOUNTER — Other Ambulatory Visit: Payer: Self-pay

## 2018-03-14 DIAGNOSIS — I739 Peripheral vascular disease, unspecified: Secondary | ICD-10-CM

## 2018-03-14 DIAGNOSIS — I700 Atherosclerosis of aorta: Secondary | ICD-10-CM

## 2018-04-22 ENCOUNTER — Encounter: Payer: Self-pay | Admitting: Family

## 2018-04-22 ENCOUNTER — Ambulatory Visit: Payer: Self-pay | Admitting: Physician Assistant

## 2018-04-22 ENCOUNTER — Encounter (HOSPITAL_COMMUNITY): Payer: Self-pay

## 2018-04-24 ENCOUNTER — Telehealth: Payer: Self-pay | Admitting: *Deleted

## 2018-04-24 ENCOUNTER — Encounter (HOSPITAL_COMMUNITY): Payer: Self-pay

## 2018-04-24 ENCOUNTER — Other Ambulatory Visit: Payer: Self-pay

## 2018-04-24 ENCOUNTER — Emergency Department (HOSPITAL_COMMUNITY): Payer: Self-pay

## 2018-04-24 ENCOUNTER — Emergency Department (HOSPITAL_COMMUNITY)
Admission: EM | Admit: 2018-04-24 | Discharge: 2018-04-24 | Disposition: A | Discharge status: Home / Self Care | Payer: Self-pay | Attending: Emergency Medicine | Admitting: Emergency Medicine

## 2018-04-24 DIAGNOSIS — Z7982 Long term (current) use of aspirin: Secondary | ICD-10-CM | POA: Insufficient documentation

## 2018-04-24 DIAGNOSIS — Z87891 Personal history of nicotine dependence: Secondary | ICD-10-CM | POA: Insufficient documentation

## 2018-04-24 DIAGNOSIS — Z79899 Other long term (current) drug therapy: Secondary | ICD-10-CM | POA: Insufficient documentation

## 2018-04-24 DIAGNOSIS — I10 Essential (primary) hypertension: Secondary | ICD-10-CM | POA: Insufficient documentation

## 2018-04-24 DIAGNOSIS — R079 Chest pain, unspecified: Secondary | ICD-10-CM | POA: Insufficient documentation

## 2018-04-24 LAB — CBC
HCT: 49.4 % (ref 39.0–52.0)
Hemoglobin: 16.4 g/dL (ref 13.0–17.0)
MCH: 32.7 pg (ref 26.0–34.0)
MCHC: 33.2 g/dL (ref 30.0–36.0)
MCV: 98.4 fL (ref 80.0–100.0)
Platelets: 215 10*3/uL (ref 150–400)
RBC: 5.02 MIL/uL (ref 4.22–5.81)
RDW: 13.1 % (ref 11.5–15.5)
WBC: 8.4 10*3/uL (ref 4.0–10.5)
nRBC: 0 % (ref 0.0–0.2)

## 2018-04-24 LAB — BASIC METABOLIC PANEL
Anion gap: 12 (ref 5–15)
BUN: <5 mg/dL — ABNORMAL LOW (ref 6–20)
CO2: 23 mmol/L (ref 22–32)
Calcium: 8.6 mg/dL — ABNORMAL LOW (ref 8.9–10.3)
Chloride: 103 mmol/L (ref 98–111)
Creatinine, Ser: 1.04 mg/dL (ref 0.61–1.24)
GFR calc Af Amer: >60 mL/min (ref >60)
GFR calc non Af Amer: >60 mL/min (ref >60)
Glucose, Bld: 96 mg/dL (ref 70–99)
Potassium: 4.4 mmol/L (ref 3.5–5.1)
Sodium: 138 mmol/L (ref 135–145)

## 2018-04-24 LAB — I-STAT TROPONIN, ED: Troponin i, poc: 0.01 ng/mL (ref 0.00–0.08)

## 2018-04-24 LAB — TROPONIN I: Troponin I: <0.03 ng/mL (ref <0.03)

## 2018-04-24 MED ORDER — OXYCODONE HCL 5 MG PO TABS: 5.0000 mg | ORAL_TABLET | Freq: Four times a day (QID) | ORAL | 0 refills | Status: DC | PRN

## 2018-04-24 MED ORDER — ASPIRIN 81 MG PO CHEW
324.0000 mg | CHEWABLE_TABLET | Freq: Once | ORAL | Status: DC
  Filled 2018-04-24: qty 4

## 2018-04-24 MED ORDER — ONDANSETRON HCL 4 MG PO TABS: 4.0000 mg | ORAL_TABLET | Freq: Four times a day (QID) | ORAL | 0 refills | Status: DC

## 2018-04-24 MED ORDER — CARVEDILOL 12.5 MG PO TABS
12.5000 mg | ORAL_TABLET | Freq: Two times a day (BID) | ORAL | Status: DC
  Administered 2018-04-24: 12.5 mg via ORAL
  Filled 2018-04-24: qty 1

## 2018-04-24 MED ORDER — MORPHINE SULFATE (PF) 4 MG/ML IV SOLN
4.0000 mg | Freq: Once | INTRAVENOUS | Status: AC
  Administered 2018-04-24: 4 mg via INTRAVENOUS
  Filled 2018-04-24: qty 1

## 2018-04-24 MED ORDER — FAMOTIDINE 20 MG PO TABS
20.0000 mg | ORAL_TABLET | Freq: Once | ORAL | Status: AC
  Administered 2018-04-24: 20 mg via ORAL
  Filled 2018-04-24: qty 1

## 2018-04-24 MED ORDER — ROSUVASTATIN CALCIUM 20 MG PO TABS: 40.0000 mg | ORAL_TABLET | Freq: Every day | ORAL | Status: DC

## 2018-04-24 MED ORDER — CLOPIDOGREL BISULFATE 75 MG PO TABS
75.0000 mg | ORAL_TABLET | Freq: Every day | ORAL | Status: DC
  Administered 2018-04-24: 75 mg via ORAL
  Filled 2018-04-24: qty 1

## 2018-04-24 MED ORDER — SODIUM CHLORIDE 0.9% FLUSH
3.0000 mL | Freq: Once | INTRAVENOUS | Status: AC
  Administered 2018-04-24: 3 mL via INTRAVENOUS

## 2018-04-24 NOTE — ED Triage Notes (Signed)
[facility]- pt coming from [facility] with complaint of chest wall pain after cough X3 days. Pt reports coughing so hard he vomits.   142/100 120 pulse SPO2 99%

## 2018-04-24 NOTE — Telephone Encounter (Signed)
Pharmacy called related to pt presenting only half the Rx.  EDCM reviewed chart to find that missing Rx was for zofran; pt states he has zofran at home and does not need more.  EDCM confirmed with EDP that it was ok to fill oxycodone only.

## 2018-04-24 NOTE — ED Provider Notes (Signed)
[facility] EMERGENCY DEPARTMENT Provider Note   CSN: [id number] Arrival date & time: 04/24/18  4098     History   Chief Complaint Chief Complaint  Patient presents with  . Cough  . Chest Pain    HPI Douglas [name] is a 53 y.o. male.  HPI   54 year old male with chest pain.  Onset around 2 AM when he was sleeping.  Describes pain as sharp.  Is been relatively constant since onset.  Associate with shortness of breath.  He is also been coughing.  Nonproductive.  No fevers or chills.  No unusual leg swelling.  He has a past history of CAD status post bypass and stenting.  Peripheral vascular disease.He has been off of his meds for 1w because he ran out.   Past Medical History:  Diagnosis Date  . Anginal pain (HCC)   . Coronary artery disease   . GERD (gastroesophageal reflux disease)   . History of acute pancreatitis 04/06/2015  . NSTEMI (non-ST elevated myocardial infarction) (HCC) 01/2014  . Pancreatitis   . PTSD (post-traumatic stress disorder)   . Tobacco abuse     Patient Active Problem List   Diagnosis Date Noted  . Sepsis (HCC) 01/05/2018  . Cellulitis 01/04/2018  . PVD (peripheral vascular disease) (HCC) 01/04/2018  . HTN (hypertension) 01/04/2018  . HLD (hyperlipidemia) 01/04/2018  . GERD (gastroesophageal reflux disease) 01/04/2018  . Peripheral vascular disease of extremity with claudication (HCC) 11/06/2017  . Chest pain 11/06/2017  . Aortic atherosclerosis (HCC) 04/10/2017  . Patient's noncompliance with other medical treatment and regimen 04/10/2017  . CAD (coronary artery disease), native coronary artery 03/15/2016  . Pancreatic divisum 04/18/2015  . History of acute pancreatitis 04/06/2015  . Alcohol abuse 03/09/2015  . Fatty liver 03/09/2015  . Unstable angina (HCC) 01/18/2014  . Tobacco abuse     Past Surgical History:  Procedure Laterality Date  . ABDOMINAL AORTOGRAM N/A 12/31/2017   Procedure: ABDOMINAL AORTOGRAM;   Surgeon: Nada Libman, MD;  Location: [facility] INVASIVE CV LAB;  Service: Cardiovascular;  Laterality: N/A;  . ABDOMINAL AORTOGRAM W/LOWER EXTREMITY N/A 11/06/2017   Procedure: ABDOMINAL AORTOGRAM W/LOWER EXTREMITY;  Surgeon: Kathleene Hazel, MD;  Location: [facility] INVASIVE CV LAB;  Service: Cardiovascular;  Laterality: N/A;  . CARDIAC CATHETERIZATION N/A 03/14/2016   Procedure: Left Heart Cath and Coronary Angiography;  Surgeon: Kathleene Hazel, MD;  Location: [facility] INVASIVE CV LAB;  Service: Cardiovascular;  Laterality: N/A;  . CORONARY ANGIOPLASTY WITH STENT PLACEMENT  01/18/2014   "1"  . CORONARY ARTERY BYPASS GRAFT N/A 03/19/2016   Procedure: CORONARY ARTERY BYPASS GRAFTING (CABG), ON PUMP, TIMES FIVE, USING LEFT INTERNAL MAMMARY ARTERY AND RIGHT GREATER SAPHENOUS VEIN HARVESTED ENDOSCOPICALLY;  Surgeon: Delight Ovens, MD;  Location: [facility] OR;  Service: Open Heart Surgery;  Laterality: N/A;  LIMA-LAD SVG-OM SVG-DIAG SEQ SVG-PD-PL  . CORONARY BALLOON ANGIOPLASTY N/A 04/11/2017   Procedure: CORONARY BALLOON ANGIOPLASTY;  Surgeon: Yvonne Kendall, MD;  Location: [facility] INVASIVE CV LAB;  Service: Cardiovascular;  Laterality: N/A;  Distal RCA  . CORONARY STENT INTERVENTION N/A 11/06/2017   Procedure: CORONARY STENT INTERVENTION;  Surgeon: Kathleene Hazel, MD;  Location: [facility] INVASIVE CV LAB;  Service: Cardiovascular;  Laterality: N/A;  . GROIN DEBRIDEMENT Right 01/07/2018   Procedure: INCISION AND DEBRIDEMENT OF right GROIN WOUND;  Surgeon: Chuck Hint, MD;  Location: [facility] OR;  Service: Vascular;  Laterality: Right;  . INTRAVASCULAR PRESSURE WIRE/FFR STUDY N/A 04/11/2017   Procedure: INTRAVASCULAR PRESSURE  WIRE/FFR STUDY;  Surgeon: Yvonne KendallEnd, Christopher, MD;  Location: [facility] INVASIVE CV LAB;  Service: Cardiovascular;  Laterality: N/A;  . LEFT HEART CATH AND CORS/GRAFTS ANGIOGRAPHY N/A 04/11/2017   Procedure: LEFT HEART CATH AND CORS/GRAFTS ANGIOGRAPHY;  Surgeon: Yvonne KendallEnd, Christopher, MD;  Location:  [facility] INVASIVE CV LAB;  Service: Cardiovascular;  Laterality: N/A;  . LEFT HEART CATH AND CORS/GRAFTS ANGIOGRAPHY N/A 11/06/2017   Procedure: LEFT HEART CATH AND CORS/GRAFTS ANGIOGRAPHY;  Surgeon: Kathleene HazelMcAlhany, Christopher D, MD;  Location: [facility] INVASIVE CV LAB;  Service: Cardiovascular;  Laterality: N/A;  . LEFT HEART CATHETERIZATION WITH CORONARY ANGIOGRAM N/A 01/18/2014   Procedure: LEFT HEART CATHETERIZATION WITH CORONARY ANGIOGRAM;  Surgeon: Runell GessJonathan J Berry, MD;  [facility];  Service: Cardiovascular;  Laterality: N/A;  . LOWER EXTREMITY ANGIOGRAPHY Bilateral 12/31/2017   Procedure: Lower Extremity Angiography;  Surgeon: Nada LibmanBrabham, Vance W, MD;  Location: [facility] INVASIVE CV LAB;  Service: Cardiovascular;  Laterality: Bilateral;  . PERIPHERAL VASCULAR INTERVENTION Left 12/31/2017   Procedure: PERIPHERAL VASCULAR INTERVENTION;  Surgeon: Nada LibmanBrabham, Vance W, MD;  Location: [facility] INVASIVE CV LAB;  Service: Cardiovascular;  Laterality: Left;  ext iliac  . TEE WITHOUT CARDIOVERSION N/A 03/19/2016   Procedure: TRANSESOPHAGEAL ECHOCARDIOGRAM (TEE);  Surgeon: Delight OvensEdward B Gerhardt, MD;  [facility];  Service: Open Heart Surgery;  Laterality: N/A;        Home Medications    Prior to Admission medications   Medication Sig Start Date End Date Taking? Authorizing Provider  acetaminophen (TYLENOL) 325 MG tablet Take 2 tablets (650 mg total) by mouth every 6 (six) hours as needed for mild pain, fever or headache. 01/10/18   Hongalgi, Maximino GreenlandAnand D, MD  aspirin EC 81 MG tablet Take 81 mg by mouth daily.    [provider]  carvedilol (COREG) 12.5 MG tablet Take 1 tablet (12.5 mg total) by mouth 2 (two) times daily with a meal. 01/10/18   Hongalgi, Maximino GreenlandAnand D, MD  clopidogrel (PLAVIX) 75 MG tablet Take 1 tablet (75 mg total) by mouth daily. 11/08/17   Burnadette PopAdhikari, Amrit, MD  famotidine (PEPCID) 20 MG tablet Take 1 tablet (20 mg total) by mouth 2 (two) times daily. Patient taking differently: Take 20 mg by mouth daily as  needed for heartburn.  11/07/17   Burnadette PopAdhikari, Amrit, MD  nitroGLYCERIN (NITROSTAT) 0.4 MG SL tablet Place 1 tablet (0.4 mg total) under the tongue every 5 (five) minutes as needed for chest pain. 04/12/17   Lonia BloodMcClung, Jeffrey T, MD  Oxycodone HCl 10 MG TABS Take 1 tablet (10 mg total) by mouth every 6 (six) hours as needed (Moderate or severe pain.). Patient not taking: Reported on 01/20/2018 01/10/18   Elease EtienneHongalgi, Anand D, MD  rosuvastatin (CRESTOR) 40 MG tablet Take 1 tablet (40 mg total) by mouth daily at 6 PM. 01/10/18   Hongalgi, Maximino GreenlandAnand D, MD    Family History Family History  Problem Relation Age of Onset  . Cancer Mother        Lung cancer  . Coronary artery disease Mother   . Heart failure Maternal Grandmother     Social History Social History   Tobacco Use  . Smoking status: Former Smoker    Packs/day: 1.00    Years: 31.00    Pack years: 31.00    Types: Cigarettes  . Smokeless tobacco: Never Used  Substance Use Topics  . Alcohol use: Not Currently    Alcohol/week: 1.0 standard drinks    Types: 1 Cans of beer per week    Comment:  occ  . Drug use: Not Currently    Types: Marijuana    Comment: denies 06/17/2015     Allergies   Ibuprofen and Shellfish allergy   Review of Systems Review of Systems   Dg Chest 2 View  Result Date: 04/24/2018 CLINICAL DATA:  Cough and chest pain EXAM: CHEST - 2 VIEW COMPARISON:  01/04/2018 chest CT FINDINGS: Normal heart size and mediastinal contours. CABG and right coronary stenting. Hyperinflation. There is no edema, consolidation, effusion, or pneumothorax. Calcified granuloma in the right upper lobe. IMPRESSION: No evidence of acute disease. Electronically Signed   By: Marnee Spring M.D.   On: 04/24/2018 05:49    All systems reviewed and negative, other than as noted in HPI. Physical Exam Updated Vital Signs BP (!) 122/94 (BP Location: Left Arm)   Pulse (!) 120   Temp 98.4 F (36.9 C) (Oral)   Resp 20   SpO2 100%   Physical  Exam Vitals signs and nursing note reviewed.  Constitutional:      General: He is not in acute distress.    Appearance: He is well-developed. He is not ill-appearing or toxic-appearing.     Comments: Able ot get up from wheelchair and onto stretcher w/o assistance  HENT:     Head: Normocephalic and atraumatic.  Eyes:     General:        Right eye: No discharge.        Left eye: No discharge.     Conjunctiva/sclera: Conjunctivae normal.  Neck:     Musculoskeletal: Neck supple.  Cardiovascular:     Rate and Rhythm: Normal rate and regular rhythm.     Heart sounds: Normal heart sounds. No murmur. No friction rub. No gallop.      Comments: Mild tachycardia. Groin w/o mass or tenderness. Well healed surgical site on R. Feet appear well perfused. Palpable DP pulses.  Pulmonary:     Effort: Pulmonary effort is normal. No respiratory distress.     Breath sounds: Normal breath sounds.  Abdominal:     General: There is no distension.     Palpations: Abdomen is soft.     Tenderness: There is no abdominal tenderness.  Musculoskeletal:        General: No tenderness.  Skin:    General: Skin is warm and dry.  Neurological:     Mental Status: He is alert.  Psychiatric:        Behavior: Behavior normal.        Thought Content: Thought content normal.      ED Treatments / Results  Labs (all labs ordered are listed, but only abnormal results are displayed) Labs Reviewed  BASIC METABOLIC PANEL - Abnormal; Notable for the following components:      Result Value   BUN <5 (*)    Calcium 8.6 (*)    All other components within normal limits  CBC  TROPONIN I  I-STAT TROPONIN, ED    EKG EKG Interpretation  Date/Time:  Thursday April 24 2018 05:34:39 EST Ventricular Rate:  121 PR Interval:  150 QRS Duration: 76 QT Interval:  316 QTC Calculation: 448 R Axis:   84 Text Interpretation:  Sinus tachycardia Possible Left atrial enlargement Cannot rule out Anterior infarct , age  undetermined Abnormal ECG Confirmed by Raeford Razor [id number]) on 04/24/2018 8:04:29 AM   Radiology Dg Chest 2 View  Result Date: 04/24/2018 CLINICAL DATA:  Cough and chest pain EXAM: CHEST - 2 VIEW COMPARISON:  01/04/2018 chest CT  FINDINGS: Normal heart size and mediastinal contours. CABG and right coronary stenting. Hyperinflation. There is no edema, consolidation, effusion, or pneumothorax. Calcified granuloma in the right upper lobe. IMPRESSION: No evidence of acute disease. Electronically Signed   By: Marnee SpringJonathon  Watts M.D.   On: 04/24/2018 05:49    Procedures Procedures (including critical care time)  Medications Ordered in ED Medications  sodium chloride flush (NS) 0.9 % injection 3 mL (has no administration in time range)  carvedilol (COREG) tablet 12.5 mg (has no administration in time range)  clopidogrel (PLAVIX) tablet 75 mg (has no administration in time range)  rosuvastatin (CRESTOR) tablet 40 mg (has no administration in time range)  aspirin chewable tablet 324 mg (has no administration in time range)  famotidine (PEPCID) tablet 20 mg (has no administration in time range)     Initial Impression / Assessment and Plan / ED Course  I have reviewed the triage vital signs and the nursing notes.  Pertinent labs & imaging results that were available during my care of the patient were reviewed by me and considered in my medical decision making (see chart for details).     53 year old male with chest pain.  Seems somewhat atypical for ACS given the constant duration since about 2:00 this morning.  Also sharp in nature associated severe cough.  Mild tachycardia, otherwise vitals reassuring.  He does have a history of CAD though.  He has been off his medication approximately 1 week.  His EKG shows sinus tachycardia.  He does not seem sniffily changed from his prior one.  Initial troponin is normal.  Chest x-ray without acute abnormality.  Will order his home medications.  Will repeat  troponin.  At this point time, I have a low suspicion for ACS.  Doubt PE, dissection or other emergent process.  If second troponin is normal, will plan on outpatient [facility] as well as cardiology.  Final Clinical Impressions(s) / ED Diagnoses   Final diagnoses:  Chest pain, unspecified type    ED Discharge Orders    None       Raeford RazorKohut, Jostin Rue, MD 04/25/18 1356

## 2018-05-02 ENCOUNTER — Encounter (HOSPITAL_COMMUNITY): Payer: Self-pay | Admitting: Emergency Medicine

## 2018-05-02 ENCOUNTER — Other Ambulatory Visit: Payer: Self-pay

## 2018-05-02 ENCOUNTER — Emergency Department (HOSPITAL_COMMUNITY): Payer: Self-pay

## 2018-05-02 ENCOUNTER — Emergency Department (HOSPITAL_COMMUNITY)
Admission: EM | Admit: 2018-05-02 | Discharge: 2018-05-03 | Disposition: A | Discharge status: Other Acute Inpatient Hospital | Payer: Self-pay | Attending: Emergency Medicine | Admitting: Emergency Medicine

## 2018-05-02 DIAGNOSIS — F1010 Alcohol abuse, uncomplicated: Secondary | ICD-10-CM

## 2018-05-02 DIAGNOSIS — F1020 Alcohol dependence, uncomplicated: Secondary | ICD-10-CM | POA: Insufficient documentation

## 2018-05-02 DIAGNOSIS — R45851 Suicidal ideations: Secondary | ICD-10-CM

## 2018-05-02 DIAGNOSIS — I2510 Atherosclerotic heart disease of native coronary artery without angina pectoris: Secondary | ICD-10-CM | POA: Insufficient documentation

## 2018-05-02 DIAGNOSIS — Z951 Presence of aortocoronary bypass graft: Secondary | ICD-10-CM | POA: Insufficient documentation

## 2018-05-02 DIAGNOSIS — F332 Major depressive disorder, recurrent severe without psychotic features: Secondary | ICD-10-CM | POA: Diagnosis present

## 2018-05-02 DIAGNOSIS — Z87891 Personal history of nicotine dependence: Secondary | ICD-10-CM | POA: Insufficient documentation

## 2018-05-02 DIAGNOSIS — Z955 Presence of coronary angioplasty implant and graft: Secondary | ICD-10-CM | POA: Insufficient documentation

## 2018-05-02 DIAGNOSIS — Z79899 Other long term (current) drug therapy: Secondary | ICD-10-CM | POA: Insufficient documentation

## 2018-05-02 DIAGNOSIS — I10 Essential (primary) hypertension: Secondary | ICD-10-CM | POA: Insufficient documentation

## 2018-05-02 DIAGNOSIS — F333 Major depressive disorder, recurrent, severe with psychotic symptoms: Secondary | ICD-10-CM | POA: Insufficient documentation

## 2018-05-02 DIAGNOSIS — Z7902 Long term (current) use of antithrombotics/antiplatelets: Secondary | ICD-10-CM | POA: Insufficient documentation

## 2018-05-02 DIAGNOSIS — F32A Depression, unspecified: Secondary | ICD-10-CM

## 2018-05-02 DIAGNOSIS — F329 Major depressive disorder, single episode, unspecified: Secondary | ICD-10-CM

## 2018-05-02 LAB — COMPREHENSIVE METABOLIC PANEL
ALT: 36 U/L (ref 0–44)
AST: 57 U/L — AB (ref 15–41)
Albumin: 3.7 g/dL (ref 3.5–5.0)
Alkaline Phosphatase: 138 U/L — ABNORMAL HIGH (ref 38–126)
Anion gap: 9 (ref 5–15)
BILIRUBIN TOTAL: 0.5 mg/dL (ref 0.3–1.2)
BUN: 5 mg/dL — ABNORMAL LOW (ref 6–20)
CO2: 24 mmol/L (ref 22–32)
CREATININE: 1.04 mg/dL (ref 0.61–1.24)
Calcium: 8.4 mg/dL — ABNORMAL LOW (ref 8.9–10.3)
Chloride: 99 mmol/L (ref 98–111)
GFR calc Af Amer: >60 mL/min (ref >60)
Glucose, Bld: 82 mg/dL (ref 70–99)
Potassium: 3.9 mmol/L (ref 3.5–5.1)
Sodium: 132 mmol/L — ABNORMAL LOW (ref 135–145)
Total Protein: 6.7 g/dL (ref 6.5–8.1)

## 2018-05-02 LAB — RAPID URINE DRUG SCREEN, HOSP PERFORMED
Amphetamines: NOT DETECTED
Barbiturates: NOT DETECTED
Benzodiazepines: NOT DETECTED
Cocaine: NOT DETECTED
Opiates: NOT DETECTED
Tetrahydrocannabinol: NOT DETECTED

## 2018-05-02 LAB — URINALYSIS, ROUTINE W REFLEX MICROSCOPIC
Bilirubin Urine: NEGATIVE
Glucose, UA: NEGATIVE mg/dL
Hgb urine dipstick: NEGATIVE
Ketones, ur: NEGATIVE mg/dL
Leukocytes,Ua: NEGATIVE
Nitrite: NEGATIVE
PROTEIN: NEGATIVE mg/dL
Specific Gravity, Urine: 1.001 — ABNORMAL LOW (ref 1.005–1.030)
pH: 5 (ref 5.0–8.0)

## 2018-05-02 LAB — ETHANOL: ALCOHOL ETHYL (B): 163 mg/dL — AB (ref <10)

## 2018-05-02 LAB — CBC
HCT: 54.2 % — ABNORMAL HIGH (ref 39.0–52.0)
Hemoglobin: 17.7 g/dL — ABNORMAL HIGH (ref 13.0–17.0)
MCH: 33.1 pg (ref 26.0–34.0)
MCHC: 32.7 g/dL (ref 30.0–36.0)
MCV: 101.5 fL — ABNORMAL HIGH (ref 80.0–100.0)
Platelets: 305 10*3/uL (ref 150–400)
RBC: 5.34 MIL/uL (ref 4.22–5.81)
RDW: 13.2 % (ref 11.5–15.5)
WBC: 10.4 10*3/uL (ref 4.0–10.5)
nRBC: 0 % (ref 0.0–0.2)

## 2018-05-02 LAB — LIPASE, BLOOD: LIPASE: 41 U/L (ref 11–51)

## 2018-05-02 LAB — SALICYLATE LEVEL: Salicylate Lvl: <7 mg/dL (ref 2.8–30.0)

## 2018-05-02 LAB — ACETAMINOPHEN LEVEL: Acetaminophen (Tylenol), Serum: <10 ug/mL — ABNORMAL LOW (ref 10–30)

## 2018-05-02 MED ORDER — VITAMIN B-1 100 MG PO TABS
100.0000 mg | ORAL_TABLET | Freq: Every day | ORAL | Status: DC
  Administered 2018-05-03: 100 mg via ORAL
  Filled 2018-05-02: qty 1

## 2018-05-02 MED ORDER — LORAZEPAM 2 MG/ML IJ SOLN: 0.0000 mg | Freq: Four times a day (QID) | INTRAMUSCULAR | Status: DC

## 2018-05-02 MED ORDER — LORAZEPAM 1 MG PO TABS
0.0000 mg | ORAL_TABLET | Freq: Four times a day (QID) | ORAL | Status: DC
  Administered 2018-05-03: 1 mg via ORAL
  Administered 2018-05-03: 2 mg via ORAL
  Filled 2018-05-02: qty 2
  Filled 2018-05-02: qty 1

## 2018-05-02 MED ORDER — LORAZEPAM 2 MG/ML IJ SOLN: 0.0000 mg | Freq: Two times a day (BID) | INTRAMUSCULAR | Status: DC

## 2018-05-02 MED ORDER — THIAMINE HCL 100 MG/ML IJ SOLN: 100.0000 mg | Freq: Every day | INTRAMUSCULAR | Status: DC

## 2018-05-02 MED ORDER — SODIUM CHLORIDE 0.9 % IV BOLUS
1000.0000 mL | Freq: Once | INTRAVENOUS | Status: AC
  Administered 2018-05-02: 1000 mL via INTRAVENOUS

## 2018-05-02 MED ORDER — ACETAMINOPHEN 325 MG PO TABS: 625.0000 mg | ORAL_TABLET | Freq: Four times a day (QID) | ORAL | Status: DC | PRN

## 2018-05-02 MED ORDER — ONDANSETRON HCL 4 MG/2ML IJ SOLN
4.0000 mg | Freq: Once | INTRAMUSCULAR | Status: AC
  Administered 2018-05-02: 4 mg via INTRAVENOUS
  Filled 2018-05-02: qty 2

## 2018-05-02 MED ORDER — LORAZEPAM 1 MG PO TABS: 0.0000 mg | ORAL_TABLET | Freq: Two times a day (BID) | ORAL | Status: DC

## 2018-05-02 MED ORDER — CLOPIDOGREL BISULFATE 75 MG PO TABS
75.0000 mg | ORAL_TABLET | Freq: Every day | ORAL | Status: DC
  Administered 2018-05-03: 75 mg via ORAL
  Filled 2018-05-02: qty 1

## 2018-05-02 MED ORDER — ROSUVASTATIN CALCIUM 40 MG PO TABS
40.0000 mg | ORAL_TABLET | Freq: Every day | ORAL | Status: DC
  Filled 2018-05-02: qty 1

## 2018-05-02 MED ORDER — CARVEDILOL 12.5 MG PO TABS
12.5000 mg | ORAL_TABLET | Freq: Two times a day (BID) | ORAL | Status: DC
  Administered 2018-05-03: 12.5 mg via ORAL
  Filled 2018-05-02 (×2): qty 1

## 2018-05-02 MED ORDER — ACETAMINOPHEN 325 MG PO TABS
650.0000 mg | ORAL_TABLET | Freq: Once | ORAL | Status: AC
  Administered 2018-05-02: 650 mg via ORAL
  Filled 2018-05-02: qty 2

## 2018-05-02 NOTE — [facility] Counselor (Signed)
Clinician asked the pt if he has any family, friend supports. Pt reported, his sons are supports but he is too embarrassed and depressed to tell them what's going on. Pt declined clinician contacting supports.  Redmond Pulling, MS, [facility], [facility] Triage Specialist [phone]

## 2018-05-02 NOTE — ED Provider Notes (Signed)
[facility]-EMERGENCY DEPT Provider Note   CSN: [id number] Arrival date & time: 05/02/18  2059    History   Chief Complaint Chief Complaint  Patient presents with  . Abdominal Pain  . Suicidal    HPI Douglas [name] is a 53 y.o. male.     The history is provided by the patient.  Mental Health Problem  Presenting symptoms: suicidal thoughts   Degree of incapacity (severity):  Moderate Onset quality:  Gradual Timing:  Intermittent Progression:  Waxing and waning Context: alcohol use   Treatment compliance:  Untreated Worsened by:  Alcohol Associated symptoms: abdominal pain   Associated symptoms: no chest pain   Risk factors: hx of mental illness     Past Medical History:  Diagnosis Date  . Anginal pain (HCC)   . Coronary artery disease   . GERD (gastroesophageal reflux disease)   . History of acute pancreatitis 04/06/2015  . NSTEMI (non-ST elevated myocardial infarction) (HCC) 01/2014  . Pancreatitis   . PTSD (post-traumatic stress disorder)   . Tobacco abuse     Patient Active Problem List   Diagnosis Date Noted  . Alcohol use disorder, severe, dependence (HCC) 05/03/2018  . Major depressive disorder, recurrent severe without psychotic features (HCC) 05/03/2018  . Sepsis (HCC) 01/05/2018  . Cellulitis 01/04/2018  . PVD (peripheral vascular disease) (HCC) 01/04/2018  . HTN (hypertension) 01/04/2018  . HLD (hyperlipidemia) 01/04/2018  . GERD (gastroesophageal reflux disease) 01/04/2018  . Peripheral vascular disease of extremity with claudication (HCC) 11/06/2017  . Chest pain 11/06/2017  . Aortic atherosclerosis (HCC) 04/10/2017  . Patient's noncompliance with other medical treatment and regimen 04/10/2017  . CAD (coronary artery disease), native coronary artery 03/15/2016  . Pancreatic divisum 04/18/2015  . History of acute pancreatitis 04/06/2015  . Alcohol abuse 03/09/2015  . Fatty liver 03/09/2015  . Unstable angina (HCC)  01/18/2014  . Tobacco abuse     Past Surgical History:  Procedure Laterality Date  . ABDOMINAL AORTOGRAM N/A 12/31/2017   Procedure: ABDOMINAL AORTOGRAM;  Surgeon: Nada Libman, MD;  Location: [facility] INVASIVE CV LAB;  Service: Cardiovascular;  Laterality: N/A;  . ABDOMINAL AORTOGRAM W/LOWER EXTREMITY N/A 11/06/2017   Procedure: ABDOMINAL AORTOGRAM W/LOWER EXTREMITY;  Surgeon: Kathleene Hazel, MD;  Location: [facility] INVASIVE CV LAB;  Service: Cardiovascular;  Laterality: N/A;  . CARDIAC CATHETERIZATION N/A 03/14/2016   Procedure: Left Heart Cath and Coronary Angiography;  Surgeon: Kathleene Hazel, MD;  Location: [facility] INVASIVE CV LAB;  Service: Cardiovascular;  Laterality: N/A;  . CORONARY ANGIOPLASTY WITH STENT PLACEMENT  01/18/2014   "1"  . CORONARY ARTERY BYPASS GRAFT N/A 03/19/2016   Procedure: CORONARY ARTERY BYPASS GRAFTING (CABG), ON PUMP, TIMES FIVE, USING LEFT INTERNAL MAMMARY ARTERY AND RIGHT GREATER SAPHENOUS VEIN HARVESTED ENDOSCOPICALLY;  Surgeon: Delight Ovens, MD;  Location: [facility] OR;  Service: Open Heart Surgery;  Laterality: N/A;  LIMA-LAD SVG-OM SVG-DIAG SEQ SVG-PD-PL  . CORONARY BALLOON ANGIOPLASTY N/A 04/11/2017   Procedure: CORONARY BALLOON ANGIOPLASTY;  Surgeon: Yvonne Kendall, MD;  Location: [facility];  Service: Cardiovascular;  Laterality: N/A;  Distal RCA  . CORONARY STENT INTERVENTION N/A 11/06/2017   Procedure: CORONARY STENT INTERVENTION;  Surgeon: Kathleene Hazel, MD;  Location: [facility] INVAS[facility] CV LAB;  Service: Cardiovascular;  Laterality: N/A;  . GROIN DEBRIDEMENT Right 01/07/2018   Procedure: INCISION AND DEBRIDEMENT OF right GROIN WOUND;  Surgeon: Chuck Hint, MD;  Location: [facility] OR;  Service: Vascular;  Laterality: Right;  . INTRAVASCULAR  PRESSURE WIRE/FFR STUDY N/A 04/11/2017   Procedure: INTRAVASCULAR PRESSURE WIRE/FFR STUDY;  Surgeon: Yvonne KendallEnd, Christopher, MD;  Location: [facility] CV [facility];  Service: Cardiovascular;  Laterality: N/A;    . LEFT HEART CATH AND CORS/GRAFTS ANGIOGRAPHY N/A 04/11/2017   Procedure: LEFT HEART CATH AND CORS/GRAFTS ANGIOGRAPHY;  Surgeon: Yvonne KendallEnd, Christopher, MD;  Location: [facility];  Service: Cardiovascular;  Laterality: N/A;  . LEFT HEART CATH AND CORS/GRAFTS ANGIOGRAPHY N/A 11/06/2017   Procedure: LEFT HEART CATH AND CORS/GRAFTS ANGIOGRAPHY;  Surgeon: Kathleene HazelMcAlhany, Christopher D, MD;  Location: [facility];  Service: Cardiovascular;  Laterality: N/A;  . LEFT HEART CATHETERIZATION WITH CORONARY ANGIOGRAM N/A 01/18/2014   Procedure: LEFT HEART CATHETERIZATION WITH CORONARY ANGIOGRAM;  Surgeon: Runell GessJonathan J Berry, MD;  [facility];  Service: Cardiovascular;  Laterality: N/A;  . LOWER EXTREMITY ANGIOGRAPHY Bilateral 12/31/2017   Procedure: Lower Extremity Angiography;  Surgeon: Nada LibmanBrabham, Vance W, MD;  Location: [facility];  Service: Cardiovascular;  Laterality: Bilateral;  . PERIPHERAL VASCULAR INTERVENTION Left 12/31/2017   Procedure: PERIPHERAL VASCULAR INTERVENTION;  Surgeon: Nada LibmanBrabham, Vance W, MD;  Location: [facility];  Service: Cardiovascular;  Laterality: Left;  ext iliac  . TEE WITHOUT CARDIOVERSION N/A 03/19/2016   Procedure: TRANSESOPHAGEAL ECHOCARDIOGRAM (TEE);  Surgeon: Delight OvensEdward B Gerhardt, MD;  [facility];  Service: Open Heart Surgery;  Laterality: N/A;        Home Medications    Prior to Admission medications   Medication Sig Start Date End Date Taking? Authorizing Provider  acetaminophen (TYLENOL) 325 MG tablet Take 2 tablets (650 mg total) by mouth every 6 (six) hours as needed for mild pain, fever or headache. 01/10/18  Yes Hongalgi, Maximino GreenlandAnand D, MD  benzocaine (ORAJEL) 10 % mucosal gel Use as directed in the mouth or throat 2 (two) times daily as needed for mouth pain. 05/09/18   Aldean BakerSykes, Janet E, NP  carvedilol (COREG) 12.5 MG tablet Take 1 tablet (12.5 mg total) by mouth 2 (two) times daily with a meal. For high blood pressure 05/09/18   Aldean BakerSykes, Janet E, NP   clopidogrel (PLAVIX) 75 MG tablet Take 1 tablet (75 mg total) by mouth daily. For antiplatelet 05/10/18   Aldean BakerSykes, Janet E, NP  famotidine (PEPCID) 20 MG tablet Take 1 tablet (20 mg total) by mouth 2 (two) times daily. 05/10/18   Bethann BerkshireZammit, Joseph, MD  famotidine (PEPCID) 20 MG tablet Take 1 tablet (20 mg total) by mouth daily. For heartburn 05/10/18   Bethann BerkshireZammit, Joseph, MD  FLUoxetine (PROZAC) 20 MG capsule Take 1 capsule (20 mg total) by mouth daily. For mood 05/10/18   Aldean BakerSykes, Janet E, NP  hydrOXYzine (ATARAX/VISTARIL) 50 MG tablet Take 1 tablet (50 mg total) by mouth 3 (three) times daily as needed for itching, anxiety or nausea. 05/09/18   Aldean BakerSykes, Janet E, NP  Multiple Vitamin (MULTIVITAMIN WITH MINERALS) TABS tablet Take 1 tablet by mouth daily. 05/10/18   Aldean BakerSykes, Janet E, NP  nicotine (NICODERM CQ - DOSED IN MG/24 HOURS) 21 mg/24hr patch Place 1 patch (21 mg total) onto the skin daily. For smoking cessation 05/10/18   Aldean BakerSykes, Janet E, NP  traZODone (DESYREL) 100 MG tablet Take 1 tablet (100 mg total) by mouth at bedtime as needed for sleep. 05/09/18   Aldean BakerSykes, Janet E, NP    Family History Family History  Problem Relation Age of Onset  . Cancer Mother        Lung cancer  . Coronary artery disease Mother   .  Heart failure Maternal Grandmother     Social History Social History   Tobacco Use  . Smoking status: Current Every Day Smoker    Packs/day: 1.00    Years: 31.00    Pack years: 31.00    Types: Cigarettes  . Smokeless tobacco: Never Used  Substance Use Topics  . Alcohol use: Not Currently    Alcohol/week: 1.0 standard drinks    Types: 1 Cans of beer per week    Comment: occ  . Drug use: Not Currently    Types: Marijuana    Comment: denies 06/17/2015     Allergies   Ibuprofen and Shellfish allergy   Review of Systems Review of Systems  Constitutional: Negative for chills and fever.  HENT: Negative for ear pain and sore throat.   Eyes: Negative for pain and visual disturbance.   Respiratory: Negative for cough and shortness of breath.   Cardiovascular: Negative for chest pain and palpitations.  Gastrointestinal: Positive for abdominal pain. Negative for vomiting.  Genitourinary: Negative for dysuria and hematuria.  Musculoskeletal: Negative for arthralgias and back pain.  Skin: Negative for color change and rash.  Neurological: Negative for seizures and syncope.  Psychiatric/Behavioral: Positive for suicidal ideas.  All other systems reviewed and are negative.    Physical Exam Updated Vital Signs  ED Triage Vitals [05/02/18 2105]  Enc Vitals Group     BP (!) 166/108     Pulse Rate (!) 116     Resp 18     Temp 97.9 F (36.6 C)     Temp Source Oral     SpO2 99 %     Weight 150 lb (68 kg)     Height 5\' 11"  (1.803 m)     Head Circumference      Peak Flow      Pain Score 10     Pain Loc      Pain Edu?      Excl. in GC?     Physical Exam Vitals signs and nursing note reviewed.  Constitutional:      Appearance: He is well-developed.  HENT:     Head: Normocephalic and atraumatic.  Eyes:     Extraocular Movements: Extraocular movements intact.     Conjunctiva/sclera: Conjunctivae normal.  Neck:     Musculoskeletal: Neck supple.  Cardiovascular:     Rate and Rhythm: Normal rate and regular rhythm.     Heart sounds: Normal heart sounds. No murmur.  Pulmonary:     Effort: Pulmonary effort is normal. No respiratory distress.     Breath sounds: Normal breath sounds.  Abdominal:     General: Abdomen is flat.     Palpations: Abdomen is soft.     Tenderness: There is abdominal tenderness in the epigastric area. There is no right CVA tenderness, left CVA tenderness, guarding or rebound. Negative signs include Murphy's sign, Rovsing's sign, McBurney's sign and psoas sign.  Skin:    General: Skin is warm and dry.     Capillary Refill: Capillary refill takes less than 2 seconds.  Neurological:     General: No focal deficit present.     Mental Status:  He is alert.  Psychiatric:        Mood and Affect: Mood is depressed.        Thought Content: Thought content includes suicidal ideation. Thought content does not include homicidal ideation. Thought content does not include homicidal or suicidal plan.        Judgment: Judgment  is impulsive.      ED Treatments / Results  Labs (all labs ordered are listed, but only abnormal results are displayed) Labs Reviewed  COMPREHENSIVE METABOLIC PANEL - Abnormal; Notable for the following components:      Result Value   Sodium 132 (*)    BUN 5 (*)    Calcium 8.4 (*)    AST 57 (*)    Alkaline Phosphatase 138 (*)    All other components within normal limits  ETHANOL - Abnormal; Notable for the following components:   Alcohol, Ethyl (B) 163 (*)    All other components within normal limits  ACETAMINOPHEN LEVEL - Abnormal; Notable for the following components:   Acetaminophen (Tylenol), Serum <10 (*)    All other components within normal limits  CBC - Abnormal; Notable for the following components:   Hemoglobin 17.7 (*)    HCT 54.2 (*)    MCV 101.5 (*)    All other components within normal limits  URINALYSIS, ROUTINE W REFLEX MICROSCOPIC - Abnormal; Notable for the following components:   Color, Urine COLORLESS (*)    Specific Gravity, Urine 1.001 (*)    All other components within normal limits  SALICYLATE LEVEL  RAPID URINE DRUG SCREEN, HOSP PERFORMED  LIPASE, BLOOD    EKG EKG Interpretation  Date/Time:  Friday May 02 2018 22:44:23 EST Ventricular Rate:  98 PR Interval:    QRS Duration: 80 QT Interval:  386 QTC Calculation: 493 R Axis:   77 Text Interpretation:  Sinus rhythm Probable left atrial enlargement Confirmed by Virgina Norfolk [phone]) on 05/02/2018 10:46:59 PM Also confirmed by Virgina Norfolk [phone]), editor Barbette Hair [phone])  on 05/03/2018 9:00:24 AM   Radiology Dg Chest Port 1 View  Result Date: 05/09/2018 CLINICAL DATA:  Initial evaluation for acute chest  pain. EXAM: PORTABLE CHEST 1 VIEW COMPARISON:  Prior radiograph from 05/02/2018. FINDINGS: Median sternotomy wires with sequelae of prior CABG. Lungs are hypoinflated with associated mild left basilar subsegmental atelectasis. No airspace consolidation, pleural effusion, or pulmonary edema is identified. There is no pneumothorax. No acute osseous abnormality. IMPRESSION: 1. Shallow lung inflation with associated mild left basilar subsegmental atelectasis. 2. No other active cardiopulmonary disease. 3. Sequelae of prior CABG. Electronically Signed   By: Rise Mu M.D.   On: 05/09/2018 22:44    Procedures Procedures (including critical care time)  Medications Ordered in ED Medications  sodium chloride 0.9 % bolus 1,000 mL (0 mLs Intravenous Stopped 05/02/18 2301)  ondansetron (ZOFRAN) injection 4 mg (4 mg Intravenous Given 05/02/18 2157)  acetaminophen (TYLENOL) tablet 650 mg (650 mg Oral Given 05/02/18 2250)     Initial Impression / Assessment and Plan / ED Course  I have reviewed the triage vital signs and the nursing notes.  Pertinent labs & imaging results that were available during my care of the patient were reviewed by me and considered in my medical decision making (see chart for details).        Douglas [name] is a 53 year old male with history of pancreatitis, alcohol abuse who presents the ED with suicidal ideation, abdominal pain.  Patient with unremarkable vitals.  Patient with suicidal ideation stating that alcohol in the recent death of his father his stressors.  He does have a plan to walk into traffic.  Has had some abdominal pain after drinking a lot of alcohol today.  Overall mild epigastric abdominal pain on exam.  Lab work showed no significant anemia, electrolyte abnormality, kidney injury.  Lipase  normal.  Liver enzymes and gallbladder enzymes overall unremarkable.  Patient given IV fluids, pain medicine with improvement.  Cleared medically and voluntarily here  awaiting psychiatric evaluation for inpatient stay.  Awaiting final psychiatric disposition.  This chart was dictated using voice recognition software.  Despite best efforts to proofread,  errors can occur which can change the documentation meaning.    Final Clinical Impressions(s) / ED Diagnoses   Final diagnoses:  Depression, unspecified depression type  Suicidal ideation  ETOH abuse  Alcohol dependence, uncomplicated (HCC)  Major depressive disorder, recurrent severe without psychotic features [facility])    ED Discharge Orders    None       Virgina Norfolk, DO 05/10/18 [id number]

## 2018-05-02 NOTE — BH Assessment (Addendum)
Assessment Note  Douglas [name] is an 53 y.o. male, who [facility] Clinician asked the pt, "what brought you to the hospital?" Pt reported, "pancreatic pain, alcoholism and depression." Pt reported, he stop drinking on is own the first time. Pt reported, on 03/03/2018 his father died, and he started back drinking after a year and a half of sobriety. Pt reported, he has "been" having suicidal thoughts but today they were worse. Pt reported, earlier he had a plan of jumping in front of a car. Pt reported, years ago when his mother died he attempted suicide. Pt reported, "seeing all types of shit," when he stops drinking. Pt denies, HI, self-injurious behaviors and access to weapons.   Pt denies abuse. Per chart, pt drank three, 40oz beers today. Pt's BAL was 163 at 2116.  Pt reported, smoking a pack and a half of cigarettes daily. Pt denies, being linked to OPT resources (medication management and/or counseling.) Pt denies, previous inpatient admissions.   Pt presents quiet, awake in scrubs with logical, coherent speech. Pt's eye contact was poor. Pt's mood was depressed, helpless, despair. Pt's judgement was impaired. Pt was oriented x4. Pt's concentration was normal. Pt's insight was fair. Pt's impulse control was poor. Pt reported, [facility] he could not contract for safety. Pt reported, if inpatient treatment is recommended he would sign-in voluntarily.   Diagnosis: Major Depressive Disorder, recurrent, severe with psychotic features.                      Alcohol use Disorder, severe.  Past Medical History:  Past Medical History:  Diagnosis Date  . Anginal pain (HCC)   . Coronary artery disease   . GERD (gastroesophageal reflux disease)   . History of acute pancreatitis 04/06/2015  . NSTEMI (non-ST elevated myocardial infarction) (HCC) 01/2014  . Pancreatitis   . PTSD (post-traumatic stress disorder)   . Tobacco abuse     Past Surgical  History:  Procedure Laterality Date  . ABDOMINAL AORTOGRAM N/A 12/31/2017   Procedure: ABDOMINAL AORTOGRAM;  Surgeon: Nada LibmanBrabham, Vance W, MD;  [facility]: [facility];  Service: Cardiovascular;  Laterality: N/A;  . ABDOMINAL AORTOGRAM W/LOWER EXTREMITY N/A 11/06/2017   Procedure: ABDOMINAL AORTOGRAM W/LOWER EXTREMITY;  Surgeon: Kathleene HazelMcAlhany, Christopher D, MD;  Location: [facility];  Service: Cardiovascular;  Laterality: N/A;  . CARDIAC CATHETERIZATION N/A 03/14/2016   Procedure: Left Heart Cath and Coronary Angiography;  Surgeon: Kathleene Hazelhristopher D McAlhany, MD;  [facility];  Service: Cardiovascular;  Laterality: N/A;  . CORONARY ANGIOPLASTY WITH STENT PLACEMENT  01/18/2014   "1"  . CORONARY ARTERY BYPASS GRAFT N/A 03/19/2016   Procedure: CORONARY ARTERY BYPASS GRAFTING (CABG), ON PUMP, TIMES FIVE, USING LEFT INTERNAL MAMMARY ARTERY AND RIGHT GREATER SAPHENOUS VEIN HARVESTED ENDOSCOPICALLY;  Surgeon: Delight OvensEdward B Gerhardt, MD;  [facility];  Service: Open Heart Surgery;  Laterality: N/A;  LIMA-LAD SVG-OM SVG-DIAG SEQ SVG-PD-PL  . CORONARY BALLOON ANGIOPLASTY N/A 04/11/2017   Procedure: CORONARY BALLOON ANGIOPLASTY;  Surgeon: Yvonne KendallEnd, Christopher, MD;  [facility];  Service: Cardiovascular;  Laterality: N/A;  Distal RCA  . CORONARY STENT INTERVENTION N/A 11/06/2017   Procedure: CORONARY STENT INTERVENTION;  Surgeon: Kathleene HazelMcAlhany, Christopher D, MD;  [facility];  Service: Cardiovascular;  Laterality: N/A;  . GROIN DEBRIDEMENT Right 01/07/2018   Procedure: INCISION AND DEBRIDEMENT OF right GROIN WOUND;  Surgeon: Chuck Hintickson, Christopher S, MD;  Location: [facility] OR;  Service: Vascular;  Laterality: Right;  . INTRAVASCULAR PRESSURE WIRE/FFR STUDY N/A 04/11/2017   Procedure: INTRAVASCULAR PRESSURE WIRE/FFR STUDY;  Surgeon: Yvonne Kendall, MD;  [facility];  Service: Cardiovascular;  Laterality: N/A;  . LEFT HEART CATH AND CORS/GRAFTS ANGIOGRAPHY N/A  04/11/2017   Procedure: LEFT HEART CATH AND CORS/GRAFTS ANGIOGRAPHY;  Surgeon: Yvonne Kendall, MD;  [facility];  Service: Cardiovascular;  Laterality: N/A;  . LEFT HEART CATH AND CORS/GRAFTS ANGIOGRAPHY N/A 11/06/2017   Procedure: LEFT HEART CATH AND CORS/GRAFTS ANGIOGRAPHY;  Surgeon: Kathleene Hazel, MD;  [facility];  Service: Cardiovascular;  Laterality: N/A;  . LEFT HEART CATHETERIZATION WITH CORONARY ANGIOGRAM N/A 01/18/2014   Procedure: LEFT HEART CATHETERIZATION WITH CORONARY ANGIOGRAM;  Surgeon: Runell Gess, MD;  Location: [facility] CATH LAB;  Service: Cardiovascular;  Laterality: N/A;  . LOWER EXTREMITY ANGIOGRAPHY Bilateral 12/31/2017   Procedure: Lower Extremity Angiography;  Surgeon: Nada Libman, MD;  [facility];  Service: Cardiovascular;  Laterality: Bilateral;  . PERIPHERAL VASCULAR INTERVENTION Left 12/31/2017   Procedure: PERIPHERAL VASCULAR INTERVENTION;  Surgeon: Nada Libman, MD;  [facility];  Service: Cardiovascular;  Laterality: Left;  ext iliac  . TEE WITHOUT CARDIOVERSION N/A 03/19/2016   Procedure: TRANSESOPHAGEAL ECHOCARDIOGRAM (TEE);  Surgeon: Delight Ovens, MD;  Location: [facility] OR;  Service: Open Heart Surgery;  Laterality: N/A;    Family History:  Family History  Problem Relation Age of Onset  . Cancer Mother        Lung cancer  . Coronary artery disease Mother   . Heart failure Maternal Grandmother     Social History:  reports that he has quit smoking. His smoking use included cigarettes. He has a 31.00 pack-year smoking history. He has never used smokeless tobacco. He reports previous alcohol use of about 1.0 standard drinks of alcohol per week. He reports previous drug use. Drug: Marijuana.  Additional Social History:  Alcohol / Drug Use Pain Medications: See MAR Prescriptions: See MAR Over the Counter: See MAR History of alcohol / drug use?: Yes Longest period of sobriety  (when/how long): 1.5 years sober.  Withdrawal Symptoms: Nausea / Vomiting, Other (Comment)(Shakes.) Substance #1 Name of Substance 1: Alcohol.  1 - Age of First Use: UTA 1 - Amount (size/oz): Per chart, pt drank three, 40oz beers today. Pt's BAL was 163 at 2116. 1 - Frequency: Daily. 1 - Duration: Ongoing. 1 - Last Use / Amount: Today (05/02/2018) Substance #2 Name of Substance 2: Cigarettes. 2 - Age of First Use: UTA 2 - Amount (size/oz): Pt reported, smoking a pack and a half of cigarettes daily.  2 - Frequency: Daily.  2 - Duration: Ongoing.  2 - Last Use / Amount: Daily.   CIWA: CIWA-Ar BP: (!) 166/108 Pulse Rate: (!) 116 COWS:    Allergies:  Allergies  Allergen Reactions  . Ibuprofen Nausea And Vomiting    UPSETS STOMACH  . Shellfish Allergy Nausea And Vomiting    Home Medications: (Not in a hospital admission)   OB/GYN Status:  No LMP for male patient.  General Assessment Data Location of Assessment: [facility] TTS Assessment: In system Is this a Tele or Face-to-Face Assessment?: Face-to-Face Is this an Initial Assessment or a Re-assessment for this encounter?: Initial Assessment Patient Accompanied by:: N/A Language Other than English: No Living Arrangements: Other (Comment)(Roommates.) What gender do you identify as?: Male Marital status: Divorced Living Arrangements: Other (Comment)(Roommates. ) Can pt  return to current living arrangement?: Yes Admission Status: Voluntary Is patient capable of signing voluntary admission?: Yes Referral Source: Self/Family/Friend Insurance type: Self-pay.      Crisis Care Plan Living Arrangements: Other (Comment)(Roommates. ) Legal Guardian: (Self. ) Name of Psychiatrist: NA Name of Therapist: NA  Education Status Is patient currently in school?: No Is the patient employed, unemployed or receiving disability?: Employed  Risk to self with the past 6 months Suicidal Ideation: Yes-Currently Present Has patient been a  risk to self within the past 6 months prior to admission? : Yes Suicidal Intent: Yes-Currently Present Has patient had any suicidal intent within the past 6 months prior to admission? : Yes Is patient at risk for suicide?: Yes Suicidal Plan?: Yes-Currently Present Has patient had any suicidal plan within the past 6 months prior to admission? : Yes Specify Current Suicidal Plan: Pt reported, he had a plan of jumping in front of a car. Access to Means: Yes Specify Access to Suicidal Means: Pt has access to traffic.  What has been your use of drugs/alcohol within the last 12 months?: Alcohol, cigarettes.  Previous Attempts/Gestures: Yes How many times?: 1 Other Self Harm Risks: Alcoholism. Triggers for Past Attempts: Other (Comment)(Mother died. ) Intentional Self Injurious Behavior: None(Pt denies. ) Family Suicide History: No Recent stressful life event(s): Other (Comment), Loss (Comment)(Father's death, wanting to maintain his soberity. ) Persecutory voices/beliefs?: No Depression: Yes Depression Symptoms: Feeling angry/irritable, Feeling worthless/self pity, Loss of interest in usual pleasures, Guilt, Fatigue, Isolating, Tearfulness, Insomnia, Despondent Substance abuse history and/or treatment for substance abuse?: Yes Suicide prevention information given to non-admitted patients: Not applicable  Risk to Others within the past 6 months Homicidal Ideation: No(Pt denies. ) Does patient have any lifetime risk of violence toward others beyond the six months prior to admission? : No(Pt denies. ) Thoughts of Harm to Others: No(Pt denies. ) Current Homicidal Intent: No Current Homicidal Plan: No(Pt denies. ) Access to Homicidal Means: No Identified Victim: NA History of harm to others?: No Assessment of Violence: None Noted Violent Behavior Description: NA Does patient have access to weapons?: No(Pt denies. ) Criminal Charges Pending?: No Does patient have a court date: No Is patient  on probation?: No  Psychosis Hallucinations: Visual(Due to alcohol use. ) Delusions: None noted  Mental Status Report Appearance/Hygiene: In scrubs Eye Contact: Poor Motor Activity: Unremarkable Speech: Logical/coherent Level of Consciousness: Quiet/awake Mood: Depressed, Helpless, Despair Affect: Flat Anxiety Level: Minimal Thought Processes: Coherent, Relevant Judgement: Impaired Orientation: Person, Place, Time, Situation Obsessive Compulsive Thoughts/Behaviors: None  Cognitive Functioning Concentration: Normal Memory: Recent Impaired Is patient IDD: No Insight: Fair Impulse Control: Poor Appetite: Poor Have you had any weight changes? : No Change Sleep: Decreased Total Hours of Sleep: 4 Vegetative Symptoms: Staying in bed, Decreased grooming  ADLScreening [facility] Assessment Services) Patient's cognitive ability adequate to safely complete daily activities?: Yes Patient able to express need for assistance with ADLs?: Yes Independently performs ADLs?: Yes (appropriate for developmental age)  Prior Inpatient Therapy Prior Inpatient Therapy: No  Prior Outpatient Therapy Prior Outpatient Therapy: No Does patient have an ACCT team?: No Does patient have Intensive In-House Services?  : No Does patient have [facility] services? : No Does patient have P4CC services?: No  ADL Screening (condition at time of admission) Patient's cognitive ability adequate to safely complete daily activities?: Yes Is the patient deaf or have difficulty hearing?: No Does the patient have difficulty seeing, even when wearing glasses/contacts?: Yes(Pt wears glasses. ) Does the patient have  difficulty concentrating, remembering, or making decisions?: Yes Patient able to express need for assistance with ADLs?: Yes Does the patient have difficulty dressing or bathing?: No Independently performs ADLs?: Yes (appropriate for developmental age) Does the patient have difficulty walking or climbing  stairs?: No Weakness of Legs: None Weakness of Arms/Hands: None  Home Assistive Devices/Equipment Home Assistive Devices/Equipment: Eyeglasses    Abuse/Neglect Assessment (Assessment to be complete while patient is alone) Abuse/Neglect Assessment Can Be Completed: Yes Physical Abuse: Denies(Pt denies. ) Verbal Abuse: Denies(Pt denies. ) Sexual Abuse: Denies(Pt denies. ) Exploitation of patient/patient's resources: Denies(Pt denies. ) Self-Neglect: Denies(Pt denies. )     Advance Directives (For Healthcare) Does Patient Have a Medical Advance Directive?: No          Disposition: Nira ConnJason Berry, FNP recommends overnight observation for safety, stabilization and re-evaluation. Disposition discussed with Dr. Lockie Molauratolo.    Disposition Initial Assessment Completed for this Encounter: Yes  On Site Evaluation by: Redmond Pullingreylese D Quanah Majka, MS, [facility], [facility]. Reviewed with Physician: Dr. Lockie Molauratolo and Nira ConnJason Berry, FNP.   Redmond Pullingreylese D Kierstan Auer 05/02/2018 11:56 PM    Redmond Pullingreylese D Laural Eiland, MS, [facility], [facility] Triage Specialist [phone]

## 2018-05-02 NOTE — ED Triage Notes (Addendum)
Per [employer], called because patient was attempting to jump into traffic. Reports SI. Patient voluntarily came with [employer] requesting help. Reports drinking three 40oz beers today and c/o abdominal pain. Hx pancreatitis and alcohol abuse.

## 2018-05-02 NOTE — ED Notes (Signed)
Attempted EKG. Pt was unable to sit still while trying to capture EKG.

## 2018-05-02 NOTE — ED Provider Notes (Signed)
[facility]-EMERGENCY DEPT Provider Note   CSN: [id number] Arrival date & time: 05/02/18  2059    History   Chief Complaint Chief Complaint  Patient presents with  . Abdominal Pain  . Suicidal    HPI Douglas [name] is a 53 y.o. male.     The history is provided by the patient.  Abdominal Pain  Pain location:  Epigastric Pain quality: aching and cramping   Pain radiates to:  Does not radiate Pain severity:  Mild Onset quality:  Gradual Timing:  Intermittent Progression:  Waxing and waning Context: alcohol use   Relieved by:  Nothing Worsened by:  Nothing Associated symptoms: anorexia and nausea   Associated symptoms: no chest pain, no chills, no constipation, no cough, no diarrhea, no dysuria, no fatigue, no fever, no flatus, no hematuria, no melena, no shortness of breath, no sore throat and no vomiting   Risk factors: alcohol abuse     Past Medical History:  Diagnosis Date  . Anginal pain (HCC)   . Coronary artery disease   . GERD (gastroesophageal reflux disease)   . History of acute pancreatitis 04/06/2015  . NSTEMI (non-ST elevated myocardial infarction) (HCC) 01/2014  . Pancreatitis   . PTSD (post-traumatic stress disorder)   . Tobacco abuse     Patient Active Problem List   Diagnosis Date Noted  . Sepsis (HCC) 01/05/2018  . Cellulitis 01/04/2018  . PVD (peripheral vascular disease) (HCC) 01/04/2018  . HTN (hypertension) 01/04/2018  . HLD (hyperlipidemia) 01/04/2018  . GERD (gastroesophageal reflux disease) 01/04/2018  . Peripheral vascular disease of extremity with claudication (HCC) 11/06/2017  . Chest pain 11/06/2017  . Aortic atherosclerosis (HCC) 04/10/2017  . Patient's noncompliance with other medical treatment and regimen 04/10/2017  . CAD (coronary artery disease), native coronary artery 03/15/2016  . Pancreatic divisum 04/18/2015  . History of acute pancreatitis 04/06/2015  . Alcohol abuse 03/09/2015  . Fatty liver  03/09/2015  . Unstable angina (HCC) 01/18/2014  . Tobacco abuse     Past Surgical History:  Procedure Laterality Date  . ABDOMINAL AORTOGRAM N/A 12/31/2017   Procedure: ABDOMINAL AORTOGRAM;  Surgeon: Nada LibmanBrabham, Vance W, MD;  [facility]: [facility];  Service: Cardiovascular;  Laterality: N/A;  . ABDOMINAL AORTOGRAM W/LOWER EXTREMITY N/A 11/06/2017   Procedure: ABDOMINAL AORTOGRAM W/LOWER EXTREMITY;  Surgeon: Kathleene HazelMcAlhany, Christopher D, MD;  Location: [facility];  Service: Cardiovascular;  Laterality: N/A;  . CARDIAC CATHETERIZATION N/A 03/14/2016   Procedure: Left Heart Cath and Coronary Angiography;  Surgeon: Kathleene Hazelhristopher D McAlhany, MD;  [facility];  Service: Cardiovascular;  Laterality: N/A;  . CORONARY ANGIOPLASTY WITH STENT PLACEMENT  01/18/2014   "1"  . CORONARY ARTERY BYPASS GRAFT N/A 03/19/2016   Procedure: CORONARY ARTERY BYPASS GRAFTING (CABG), ON PUMP, TIMES FIVE, USING LEFT INTERNAL MAMMARY ARTERY AND RIGHT GREATER SAPHENOUS VEIN HARVESTED ENDOSCOPICALLY;  Surgeon: Delight OvensEdward B Gerhardt, MD;  [facility];  Service: Open Heart Surgery;  Laterality: N/A;  LIMA-LAD SVG-OM SVG-DIAG SEQ SVG-PD-PL  . CORONARY BALLOON ANGIOPLASTY N/A 04/11/2017   Procedure: CORONARY BALLOON ANGIOPLASTY;  Surgeon: Yvonne KendallEnd, Christopher, MD;  [facility]: [facility];  Service: Cardiovascular;  Laterality: N/A;  Distal RCA  . CORONARY STENT INTERVENTION N/A 11/06/2017   Procedure: CORONARY STENT INTERVENTION;  Surgeon: Kathleene HazelMcAlhany, Christopher D, MD;  [facility]: [facility];  Service: Cardiovascular;  Laterality: N/A;  . GROIN DEBRIDEMENT Right 01/07/2018   Procedure: INCISION AND DEBRIDEMENT OF right GROIN WOUND;  Surgeon:  Chuck Hint, MD;  Location: [facility] OR;  Service: Vascular;  Laterality: Right;  . INTRAVASCULAR PRESSURE WIRE/FFR STUDY N/A 04/11/2017   Procedure: INTRAVASCULAR PRESSURE WIRE/FFR STUDY;  Surgeon: Yvonne Kendall, MD;  [facility];  Service:  Cardiovascular;  Laterality: N/A;  . LEFT HEART CATH AND CORS/GRAFTS ANGIOGRAPHY N/A 04/11/2017   Procedure: LEFT HEART CATH AND CORS/GRAFTS ANGIOGRAPHY;  Surgeon: Yvonne Kendall, MD;  [facility]: [facility];  Service: Cardiovascular;  Laterality: N/A;  . LEFT HEART CATH AND CORS/GRAFTS ANGIOGRAPHY N/A 11/06/2017   Procedure: LEFT HEART CATH AND CORS/GRAFTS ANGIOGRAPHY;  Surgeon: Kathleene Hazel, MD;  [facility];  Service: Cardiovascular;  Laterality: N/A;  . LEFT HEART CATHETERIZATION WITH CORONARY ANGIOGRAM N/A 01/18/2014   Procedure: LEFT HEART CATHETERIZATION WITH CORONARY ANGIOGRAM;  Surgeon: Runell Gess, MD;  Location: [facility] CATH LAB;  Service: Cardiovascular;  Laterality: N/A;  . LOWER EXTREMITY ANGIOGRAPHY Bilateral 12/31/2017   Procedure: Lower Extremity Angiography;  Surgeon: Nada Libman, MD;  [facility];  Service: Cardiovascular;  Laterality: Bilateral;  . PERIPHERAL VASCULAR INTERVENTION Left 12/31/2017   Procedure: PERIPHERAL VASCULAR INTERVENTION;  Surgeon: Nada Libman, MD;  [facility];  Service: Cardiovascular;  Laterality: Left;  ext iliac  . TEE WITHOUT CARDIOVERSION N/A 03/19/2016   Procedure: TRANSESOPHAGEAL ECHOCARDIOGRAM (TEE);  Surgeon: Delight Ovens, MD;  Location[facility] OR;  Service: Open Heart Surgery;  Laterality: N/A;        Home Medications    Prior to Admission medications   Medication Sig Start Date End Date Taking? Authorizing Provider  acetaminophen (TYLENOL) 325 MG tablet Take 2 tablets (650 mg total) by mouth every 6 (six) hours as needed for mild pain, fever or headache. 01/10/18  Yes Hongalgi, Maximino Greenland, MD  carvedilol (COREG) 12.5 MG tablet Take 1 tablet (12.5 mg total) by mouth 2 (two) times daily with a meal. 01/10/18  Yes Hongalgi, Maximino Greenland, MD  clopidogrel (PLAVIX) 75 MG tablet Take 1 tablet (75 mg total) by mouth daily. 11/08/17  Yes Burnadette Pop, MD  nitroGLYCERIN (NITROSTAT)  0.4 MG SL tablet Place 1 tablet (0.4 mg total) under the tongue every 5 (five) minutes as needed for chest pain. 04/12/17  Yes Lonia Blood, MD  oxyCODONE (ROXICODONE) 5 MG immediate release tablet Take 1 tablet (5 mg total) by mouth every 6 (six) hours as needed for severe pain. 04/24/18  Yes Raeford Razor, MD  Pseudoeph-Doxylamine-DM-APAP (NYQUIL PO) Take 30 mLs by mouth at bedtime as needed (cold symptoms).   Yes [provider]  rosuvastatin (CRESTOR) 40 MG tablet Take 1 tablet (40 mg total) by mouth daily at 6 PM. 01/10/18  Yes Hongalgi, Maximino Greenland, MD  famotidine (PEPCID) 20 MG tablet Take 1 tablet (20 mg total) by mouth 2 (two) times daily. Patient not taking: Reported on 05/02/2018 11/07/17   Burnadette Pop, MD  ondansetron (ZOFRAN) 4 MG tablet Take 1 tablet (4 mg total) by mouth every 6 (six) hours. Patient not taking: Reported on 05/02/2018 04/24/18   Raeford Razor, MD  Oxycodone HCl 10 MG TABS Take 1 tablet (10 mg total) by mouth every 6 (six) hours as needed (Moderate or severe pain.). Patient not taking: Reported on 01/20/2018 01/10/18   Elease Etienne, MD    Family History Family History  Problem Relation Age of Onset  . Cancer Mother        Lung cancer  . Coronary artery disease Mother   .  Heart failure Maternal Grandmother     Social History Social History   Tobacco Use  . Smoking status: Former Smoker    Packs/day: 1.00    Years: 31.00    Pack years: 31.00    Types: Cigarettes  . Smokeless tobacco: Never Used  Substance Use Topics  . Alcohol use: Not Currently    Alcohol/week: 1.0 standard drinks    Types: 1 Cans of beer per week    Comment: occ  . Drug use: Not Currently    Types: Marijuana    Comment: denies 06/17/2015     Allergies   Ibuprofen and Shellfish allergy   Review of Systems Review of Systems  Constitutional: Negative for chills, fatigue and fever.  HENT: Negative for ear pain and sore throat.   Eyes: Negative for pain and visual  disturbance.  Respiratory: Negative for cough and shortness of breath.   Cardiovascular: Negative for chest pain and palpitations.  Gastrointestinal: Positive for abdominal pain, anorexia and nausea. Negative for constipation, diarrhea, flatus, melena and vomiting.  Genitourinary: Negative for dysuria and hematuria.  Musculoskeletal: Negative for arthralgias and back pain.  Skin: Negative for color change and rash.  Neurological: Negative for seizures and syncope.  Psychiatric/Behavioral: Positive for decreased concentration, sleep disturbance and suicidal ideas. The patient is nervous/anxious.   All other systems reviewed and are negative.    Physical Exam Updated Vital Signs  ED Triage Vitals [05/02/18 2105]  Enc Vitals Group     BP (!) 166/108     Pulse Rate (!) 116     Resp 18     Temp 97.9 F (36.6 C)     Temp Source Oral     SpO2 99 %     Weight 150 lb (68 kg)     Height 5\' 11"  (1.803 m)     Head Circumference      Peak Flow      Pain Score 10     Pain Loc      Pain Edu?      Excl. in GC?     Physical Exam Vitals signs and nursing note reviewed.  Constitutional:      Appearance: He is well-developed.  HENT:     Head: Normocephalic and atraumatic.     Mouth/Throat:     Mouth: Mucous membranes are moist.  Eyes:     Extraocular Movements: Extraocular movements intact.     Conjunctiva/sclera: Conjunctivae normal.  Neck:     Musculoskeletal: Neck supple.  Cardiovascular:     Rate and Rhythm: Normal rate and regular rhythm.     Heart sounds: Normal heart sounds. No murmur.  Pulmonary:     Effort: Pulmonary effort is normal. No respiratory distress.     Breath sounds: Normal breath sounds.  Abdominal:     General: Abdomen is flat. Bowel sounds are normal.     Palpations: Abdomen is soft.     Tenderness: There is abdominal tenderness in the epigastric area. There is no right CVA tenderness, left CVA tenderness, guarding or rebound. Negative signs include  Murphy's sign, Rovsing's sign and McBurney's sign.  Skin:    General: Skin is warm and dry.  Neurological:     General: No focal deficit present.     Mental Status: He is alert.  Psychiatric:        Mood and Affect: Mood is depressed.        Thought Content: Thought content includes suicidal ideation. Thought content does not include  homicidal ideation. Thought content includes suicidal plan. Thought content does not include homicidal plan.      ED Treatments / Results  Labs (all labs ordered are listed, but only abnormal results are displayed) Labs Reviewed  COMPREHENSIVE METABOLIC PANEL - Abnormal; Notable for the following components:      Result Value   Sodium 132 (*)    BUN 5 (*)    Calcium 8.4 (*)    AST 57 (*)    Alkaline Phosphatase 138 (*)    All other components within normal limits  ETHANOL - Abnormal; Notable for the following components:   Alcohol, Ethyl (B) 163 (*)    All other components within normal limits  ACETAMINOPHEN LEVEL - Abnormal; Notable for the following components:   Acetaminophen (Tylenol), Serum <10 (*)    All other components within normal limits  CBC - Abnormal; Notable for the following components:   Hemoglobin 17.7 (*)    HCT 54.2 (*)    MCV 101.5 (*)    All other components within normal limits  URINALYSIS, ROUTINE W REFLEX MICROSCOPIC - Abnormal; Notable for the following components:   Color, Urine COLORLESS (*)    Specific Gravity, Urine 1.001 (*)    All other components within normal limits  SALICYLATE LEVEL  RAPID URINE DRUG SCREEN, HOSP PERFORMED  LIPASE, BLOOD    EKG EKG Interpretation  Date/Time:  Friday May 02 2018 22:44:23 EST Ventricular Rate:  98 PR Interval:    QRS Duration: 80 QT Interval:  386 QTC Calculation: 493 R Axis:   77 Text Interpretation:  Sinus rhythm Probable left atrial enlargement Confirmed by Virgina Norfolk [phone]) on 05/02/2018 10:46:59 PM   Radiology Dg Chest 2 View  Result Date:  05/02/2018 CLINICAL DATA:  Pain EXAM: CHEST - 2 VIEW COMPARISON:  04/24/2018, 11/04/2017 FINDINGS: Post sternotomy changes. No acute consolidation or effusion. Stable calcified lung nodules. Normal heart size. No pneumothorax. IMPRESSION: No active cardiopulmonary disease. Electronically Signed   By: Jasmine Pang M.D.   On: 05/02/2018 21:49    Procedures Procedures (including critical care time)  Medications Ordered in ED Medications  sodium chloride 0.9 % bolus 1,000 mL (1,000 mLs Intravenous New Bag/Given 05/02/18 2157)  ondansetron [facility]) injection 4 mg (4 mg Intravenous Given 05/02/18 2157)  acetaminophen (TYLENOL) tablet 650 mg (650 mg Oral Given 05/02/18 2250)     Initial Impression / Assessment and Plan / ED Course  I have reviewed the triage vital signs and the nursing notes.  Pertinent labs & imaging results that were available during my care of the patient were reviewed by me and considered in my medical decision making (see chart for details).        FROILAN GREENLAW is a 53 year old male with history of high cholesterol, hypertension, CAD, alcohol abuse who presents to the ED with suicidal ideation.  Patient with mild tachycardia but otherwise normal vitals.  Has had some epigastric abdominal pain as well after drinking alcohol today.  Has history of pancreatitis.  Denies any nausea, vomiting.  Has some mild epigastric tenderness on exam.  No signs of peritonitis.  Patient states that he was sober for about a year and a half and then his dad died 2 months ago and has been heavily drinking since.  He has increasing thoughts of suicide and depression.  He told family member that he was going to jump in front of traffic today but they encouraged him to come get help.  Patient wants help and  exhibits multiple symptoms consistent with depression.  Is voluntarily here.  Will medically clear him.  Patient given normal saline bolus, Zofran, Tylenol.  EKG showed sinus rhythm.  No ischemic  changes.  Patient with no significant electrolyte abnormality, anemia, leukocytosis.  Liver enzymes overall unremarkable.  Lipase normal.  Doubt pancreatitis.  Doubt acute gallbladder or liver issue.  No signs urinary tract infection.  Patient with mild alcohol elevation.  Overall patient is clinically sober.  Medically cleared and awaiting psychiatric disposition.  Patient voluntarily here.  CIWA protocol placed.  This chart was dictated using voice recognition software.  Despite best efforts to proofread,  errors can occur which can change the documentation meaning.    Final Clinical Impressions(s) / ED Diagnoses   Final diagnoses:  Depression, unspecified depression type  Suicidal ideation  ETOH abuse    ED Discharge Orders    None       Virgina Norfolk, DO 05/02/18 2252

## 2018-05-03 ENCOUNTER — Other Ambulatory Visit: Payer: Self-pay

## 2018-05-03 ENCOUNTER — Inpatient Hospital Stay (HOSPITAL_COMMUNITY)
Admission: AD | Admit: 2018-05-03 | Discharge: 2018-05-09 | DRG: 897 | Disposition: A | Discharge status: Home / Self Care | Payer: Federal, State, Local not specified - Other | Source: Intra-hospital | Attending: Psychiatry | Admitting: Psychiatry

## 2018-05-03 ENCOUNTER — Encounter (HOSPITAL_COMMUNITY): Payer: Self-pay | Admitting: *Deleted

## 2018-05-03 DIAGNOSIS — K219 Gastro-esophageal reflux disease without esophagitis: Secondary | ICD-10-CM | POA: Diagnosis present

## 2018-05-03 DIAGNOSIS — Z888 Allergy status to other drugs, medicaments and biological substances status: Secondary | ICD-10-CM | POA: Diagnosis not present

## 2018-05-03 DIAGNOSIS — Z801 Family history of malignant neoplasm of trachea, bronchus and lung: Secondary | ICD-10-CM

## 2018-05-03 DIAGNOSIS — Z951 Presence of aortocoronary bypass graft: Secondary | ICD-10-CM | POA: Diagnosis not present

## 2018-05-03 DIAGNOSIS — F1020 Alcohol dependence, uncomplicated: Secondary | ICD-10-CM | POA: Diagnosis not present

## 2018-05-03 DIAGNOSIS — I25119 Atherosclerotic heart disease of native coronary artery with unspecified angina pectoris: Secondary | ICD-10-CM | POA: Diagnosis present

## 2018-05-03 DIAGNOSIS — F17210 Nicotine dependence, cigarettes, uncomplicated: Secondary | ICD-10-CM | POA: Diagnosis present

## 2018-05-03 DIAGNOSIS — I252 Old myocardial infarction: Secondary | ICD-10-CM

## 2018-05-03 DIAGNOSIS — F10239 Alcohol dependence with withdrawal, unspecified: Principal | ICD-10-CM | POA: Diagnosis present

## 2018-05-03 DIAGNOSIS — F419 Anxiety disorder, unspecified: Secondary | ICD-10-CM | POA: Diagnosis present

## 2018-05-03 DIAGNOSIS — Z955 Presence of coronary angioplasty implant and graft: Secondary | ICD-10-CM | POA: Diagnosis not present

## 2018-05-03 DIAGNOSIS — E7800 Pure hypercholesterolemia, unspecified: Secondary | ICD-10-CM | POA: Diagnosis present

## 2018-05-03 DIAGNOSIS — Z915 Personal history of self-harm: Secondary | ICD-10-CM

## 2018-05-03 DIAGNOSIS — F332 Major depressive disorder, recurrent severe without psychotic features: Secondary | ICD-10-CM | POA: Diagnosis present

## 2018-05-03 DIAGNOSIS — Z91013 Allergy to seafood: Secondary | ICD-10-CM

## 2018-05-03 DIAGNOSIS — F4310 Post-traumatic stress disorder, unspecified: Secondary | ICD-10-CM | POA: Diagnosis present

## 2018-05-03 DIAGNOSIS — Y906 Blood alcohol level of 120-199 mg/100 ml: Secondary | ICD-10-CM | POA: Diagnosis present

## 2018-05-03 DIAGNOSIS — K860 Alcohol-induced chronic pancreatitis: Secondary | ICD-10-CM | POA: Diagnosis present

## 2018-05-03 DIAGNOSIS — I739 Peripheral vascular disease, unspecified: Secondary | ICD-10-CM | POA: Diagnosis present

## 2018-05-03 DIAGNOSIS — E785 Hyperlipidemia, unspecified: Secondary | ICD-10-CM | POA: Diagnosis present

## 2018-05-03 DIAGNOSIS — R45851 Suicidal ideations: Secondary | ICD-10-CM | POA: Diagnosis present

## 2018-05-03 DIAGNOSIS — Z8249 Family history of ischemic heart disease and other diseases of the circulatory system: Secondary | ICD-10-CM | POA: Diagnosis not present

## 2018-05-03 DIAGNOSIS — I10 Essential (primary) hypertension: Secondary | ICD-10-CM | POA: Diagnosis present

## 2018-05-03 DIAGNOSIS — G4700 Insomnia, unspecified: Secondary | ICD-10-CM | POA: Diagnosis present

## 2018-05-03 MED ORDER — CARVEDILOL 3.125 MG PO TABS
12.5000 mg | ORAL_TABLET | Freq: Two times a day (BID) | ORAL | Status: DC
  Administered 2018-05-03 – 2018-05-09 (×12): 12.5 mg via ORAL
  Filled 2018-05-03 (×2): qty 1
  Filled 2018-05-03: qty 56
  Filled 2018-05-03 (×3): qty 1
  Filled 2018-05-03: qty 56
  Filled 2018-05-03: qty 136
  Filled 2018-05-03 (×2): qty 1
  Filled 2018-05-03: qty 136
  Filled 2018-05-03 (×8): qty 1

## 2018-05-03 MED ORDER — THIAMINE HCL 100 MG/ML IJ SOLN: 100.0000 mg | Freq: Once | INTRAMUSCULAR | Status: DC

## 2018-05-03 MED ORDER — NICOTINE 21 MG/24HR TD PT24
21.0000 mg | MEDICATED_PATCH | Freq: Every day | TRANSDERMAL | Status: DC
  Administered 2018-05-03 – 2018-05-09 (×7): 21 mg via TRANSDERMAL
  Filled 2018-05-03 (×4): qty 1
  Filled 2018-05-03: qty 17
  Filled 2018-05-03: qty 1
  Filled 2018-05-03: qty 17
  Filled 2018-05-03 (×4): qty 1

## 2018-05-03 MED ORDER — LOPERAMIDE HCL 2 MG PO CAPS: 2.0000 mg | ORAL_CAPSULE | ORAL | Status: AC | PRN

## 2018-05-03 MED ORDER — HYDROXYZINE HCL 25 MG PO TABS
25.0000 mg | ORAL_TABLET | Freq: Four times a day (QID) | ORAL | Status: DC | PRN
  Administered 2018-05-03: 25 mg via ORAL
  Filled 2018-05-03: qty 1

## 2018-05-03 MED ORDER — ONDANSETRON 4 MG PO TBDP: 4.0000 mg | ORAL_TABLET | Freq: Four times a day (QID) | ORAL | Status: AC | PRN

## 2018-05-03 MED ORDER — LORAZEPAM 2 MG/ML IJ SOLN: 0.0000 mg | Freq: Four times a day (QID) | INTRAMUSCULAR | Status: DC

## 2018-05-03 MED ORDER — LORAZEPAM 1 MG PO TABS
1.0000 mg | ORAL_TABLET | Freq: Four times a day (QID) | ORAL | Status: AC
  Administered 2018-05-03 – 2018-05-04 (×5): 1 mg via ORAL
  Filled 2018-05-03 (×4): qty 1

## 2018-05-03 MED ORDER — LORAZEPAM 1 MG PO TABS
0.0000 mg | ORAL_TABLET | Freq: Four times a day (QID) | ORAL | Status: DC
  Filled 2018-05-03: qty 1

## 2018-05-03 MED ORDER — THIAMINE HCL 100 MG/ML IJ SOLN: 100.0000 mg | Freq: Every day | INTRAMUSCULAR | Status: DC

## 2018-05-03 MED ORDER — LORAZEPAM 1 MG PO TABS: 1.0000 mg | ORAL_TABLET | Freq: Every day | ORAL | Status: DC

## 2018-05-03 MED ORDER — FLUOXETINE HCL 10 MG PO CAPS
10.0000 mg | ORAL_CAPSULE | Freq: Every day | ORAL | Status: DC
  Administered 2018-05-03: 10 mg via ORAL

## 2018-05-03 MED ORDER — MAGNESIUM HYDROXIDE 400 MG/5ML PO SUSP: 30.0000 mL | Freq: Every day | ORAL | Status: DC | PRN

## 2018-05-03 MED ORDER — VITAMIN B-1 100 MG PO TABS
100.0000 mg | ORAL_TABLET | Freq: Every day | ORAL | Status: DC
  Filled 2018-05-03: qty 1

## 2018-05-03 MED ORDER — CLOPIDOGREL BISULFATE 75 MG PO TABS
75.0000 mg | ORAL_TABLET | Freq: Every day | ORAL | Status: DC
  Administered 2018-05-04 – 2018-05-09 (×6): 75 mg via ORAL
  Filled 2018-05-03 (×2): qty 1
  Filled 2018-05-03 (×2): qty 28
  Filled 2018-05-03 (×5): qty 1

## 2018-05-03 MED ORDER — ALUM & MAG HYDROXIDE-SIMETH 200-200-20 MG/5ML PO SUSP: 30.0000 mL | ORAL | Status: DC | PRN

## 2018-05-03 MED ORDER — LORAZEPAM 1 MG PO TABS: 0.0000 mg | ORAL_TABLET | Freq: Two times a day (BID) | ORAL | Status: DC

## 2018-05-03 MED ORDER — LORAZEPAM 1 MG PO TABS
1.0000 mg | ORAL_TABLET | Freq: Four times a day (QID) | ORAL | Status: AC | PRN
  Administered 2018-05-04 – 2018-05-05 (×2): 1 mg via ORAL
  Filled 2018-05-03 (×2): qty 1

## 2018-05-03 MED ORDER — LORAZEPAM 1 MG PO TABS
1.0000 mg | ORAL_TABLET | Freq: Two times a day (BID) | ORAL | Status: DC
  Administered 2018-05-06: 1 mg via ORAL
  Filled 2018-05-03: qty 1

## 2018-05-03 MED ORDER — FLUOXETINE HCL 10 MG PO CAPS
10.0000 mg | ORAL_CAPSULE | Freq: Every day | ORAL | Status: DC
  Administered 2018-05-04 – 2018-05-08 (×5): 10 mg via ORAL
  Filled 2018-05-03 (×6): qty 1

## 2018-05-03 MED ORDER — ADULT MULTIVITAMIN W/MINERALS CH
1.0000 | ORAL_TABLET | Freq: Every day | ORAL | Status: DC
  Administered 2018-05-04 – 2018-05-09 (×6): 1 via ORAL
  Filled 2018-05-03 (×5): qty 1
  Filled 2018-05-03: qty 28
  Filled 2018-05-03 (×2): qty 1
  Filled 2018-05-03: qty 28
  Filled 2018-05-03: qty 1

## 2018-05-03 MED ORDER — ROSUVASTATIN CALCIUM 20 MG PO TABS
40.0000 mg | ORAL_TABLET | Freq: Every day | ORAL | Status: DC
  Filled 2018-05-03: qty 2

## 2018-05-03 MED ORDER — LORAZEPAM 2 MG/ML IJ SOLN: 0.0000 mg | Freq: Two times a day (BID) | INTRAMUSCULAR | Status: DC

## 2018-05-03 MED ORDER — VITAMIN B-1 100 MG PO TABS
100.0000 mg | ORAL_TABLET | Freq: Every day | ORAL | Status: DC
  Administered 2018-05-04 – 2018-05-09 (×6): 100 mg via ORAL
  Filled 2018-05-03 (×8): qty 1

## 2018-05-03 MED ORDER — LORAZEPAM 1 MG PO TABS
1.0000 mg | ORAL_TABLET | Freq: Three times a day (TID) | ORAL | Status: AC
  Administered 2018-05-05 (×3): 1 mg via ORAL
  Filled 2018-05-03 (×4): qty 1

## 2018-05-03 NOTE — Tx Team (Addendum)
Initial Treatment Plan 05/03/2018 7:01 PM Douglas [name] TSV:[id number]    PATIENT STRESSORS: Educational concerns Financial difficulties Health problems   PATIENT STRENGTHS: Ability for insight Active sense of humor Average or above average intelligence   PATIENT IDENTIFIED PROBLEMS: SA  MDD            " I need help"  " I wan to quit drinking"     DISCHARGE CRITERIA:  Ability to meet basic life and health needs Adequate post-discharge living arrangements Improved stabilization in mood, thinking, and/or behavior  PRELIMINARY DISCHARGE PLAN: Attend aftercare/continuing care group Attend PHP/IOP Attend 12-step recovery group  PATIENT/FAMILY INVOLVEMENT: This treatment plan has been presented to and reviewed with the patient, Douglas [name], and/or family member, .  The patient and family have been given the opportunity to ask questions and make suggestions.  Rich Brave, RN 05/03/2018, 7:01 PM

## 2018-05-03 NOTE — ED Notes (Signed)
Pt discharged safely with [facility] driver.  All belongings were sent with patient. 

## 2018-05-03 NOTE — Progress Notes (Signed)
Patient did attend the evening speaker AA meeting.  

## 2018-05-03 NOTE — Progress Notes (Signed)
Patient ID: Douglas [name], male   DOB: [birth year], 53 y.o.   MRN: [id number] Patient is a 53 yo caucasian man who is admitted to [facility] today for the first time- to get hlep with his alcoholism and depression. He says he cant remember exactly when he started drinking, he thinks he was 53 yo. ZZHE endorses no SI, he has an acutely flat, depressed and anxious affect. He reports his last alcohol intake was Friday 05/02/2018 at 1800, he says he usually drinks 3-4 40"s QD and says  He hs tried to detox at home before, but says " I was afraid to try when I started shaking". His PMH includes an MI in 2018 , CABG x 5 in 2018 and pancreatitis. He contracts for safety , is admitted to the unit and taken to his room.

## 2018-05-03 NOTE — ED Notes (Signed)
Bed: GGY69 Expected date:  Expected time:  Means of arrival:  Comments: [place]

## 2018-05-03 NOTE — Progress Notes (Signed)
Per Thedore Mins, MD, this pt requires psychiatric hospitalization at this time.   Patient has been accepted to [facility] for today 2/22- needs vitals/ pulse re-checked before transporting.   Accepting physician: Dr. Jama Flavors  Bed: 302-1  Patient will need transportation via [facility].   Stacy Gardner, LCSW Clinical Social Worker [phone]

## 2018-05-03 NOTE — Consult Note (Addendum)
[facility] Face-to-Face Psychiatry Consult   Reason for Consult:  Alcohol detox and suicidal ideations with plan Referring Physician:  EDP Patient Identification: Douglas [name] MRN:  [id number] Principal Diagnosis: Major depressive disorder, recurrent severe without psychotic features (HCC) Diagnosis:  Principal Problem:   Major depressive disorder, recurrent severe without psychotic features (HCC) Active Problems:   Alcohol dependence, uncomplicated (HCC)   Total Time spent with patient: 45 minutes  Subjective:   Douglas [name] is a 53 y.o. male patient admitted with suicide plan and alcohol detox.  HPI:  53 yo male who came to the ED with suicidal ideations with a plan to walk into traffic, drinking alcohol daily.  He returned to drinking after his father died on 17-Mar-2018 after a year and a half of sobriety, no prior rehabs/recoveries.  His suicidal thoughts started after returning to drink but increased prior to admission. Depression increased with drinking and decreases with sobriety.  Depression is 10/10, some anxiety, poor sleep and appetite.  Last year when his mother died, he attempted to kill himself.    Past Psychiatric History: alcohol dependence, depression  Risk to Self: Suicidal Ideation: Yes-Currently Present Suicidal Intent: Yes-Currently Present Is patient at risk for suicide?: Yes Suicidal Plan?: Yes-Currently Present Specify Current Suicidal Plan: Pt reported, he had a plan of jumping in front of a car. Access to Means: Yes Specify Access to Suicidal Means: Pt has access to traffic.  What has been your use of drugs/alcohol within the last 12 months?: Alcohol, cigarettes.  How many times?: 1 Other Self Harm Risks: Alcoholism. Triggers for Past Attempts: Other (Comment)(Mother died. ) Intentional Self Injurious Behavior: None(Pt denies. ) Risk to Others: Homicidal Ideation: No(Pt denies. ) Thoughts of Harm to Others: No(Pt denies. ) Current Homicidal Intent:  No Current Homicidal Plan: No(Pt denies. ) Access to Homicidal Means: No Identified Victim: NA History of harm to others?: No Assessment of Violence: None Noted Violent Behavior Description: NA Does patient have access to weapons?: No(Pt denies. ) Criminal Charges Pending?: No Does patient have a court date: No Prior Inpatient Therapy: Prior Inpatient Therapy: No Prior Outpatient Therapy: Prior Outpatient Therapy: No Does patient have an ACCT team?: No Does patient have Intensive In-House Services?  : No Does patient have [facility] services? : No Does patient have P4CC services?: No  Past Medical History:  Past Medical History:  Diagnosis Date  . Anginal pain (HCC)   . Coronary artery disease   . GERD (gastroesophageal reflux disease)   . History of acute pancreatitis 04/06/2015  . NSTEMI (non-ST elevated myocardial infarction) (HCC) 01/2014  . Pancreatitis   . PTSD (post-traumatic stress disorder)   . Tobacco abuse     Past Surgical History:  Procedure Laterality Date  . ABDOMINAL AORTOGRAM N/A 12/31/2017   Procedure: ABDOMINAL AORTOGRAM;  Surgeon: Nada Libman, MD;  [facility] INVAS[facility] CV LAB;  Service: Cardiovascular;  Laterality: N/A;  . ABDOMINAL AORTOGRAM W/LOWER EXTREMITY N/A 11/06/2017   Procedure: ABDOMINAL AORTOGRAM W/LOWER EXTREMITY;  Surgeon: Kathleene Hazel, MD;  Location: [facility] INVASIVE CV LAB;  Service: Cardiovascular;  Laterality: N/A;  . CARDIAC CATHETERIZATION N/A 03/14/2016   Procedure: Left Heart Cath and Coronary Angiography;  Surgeon: Kathleene Hazel, MD;  Location: [facility] INVASIVE CV LAB;  Service: Cardiovascular;  Laterality: N/A;  . CORONARY ANGIOPLASTY WITH STENT PLACEMENT  01/18/2014   "1"  . CORONARY ARTERY BYPASS GRAFT N/A 03/19/2016   Procedure: CORONARY ARTERY BYPASS GRAFTING (CABG), ON PUMP, TIMES FIVE, USING LEFT INTERNAL  MAMMARY ARTERY AND RIGHT GREATER SAPHENOUS VEIN HARVESTED ENDOSCOPICALLY;  Surgeon: Delight Ovens, MD;  Location:  [facility] OR;  Service: Open Heart Surgery;  Laterality: N/A;  LIMA-LAD SVG-OM SVG-DIAG SEQ SVG-PD-PL  . CORONARY BALLOON ANGIOPLASTY N/A 04/11/2017   Procedure: CORONARY BALLOON ANGIOPLASTY;  Surgeon: Yvonne Kendall, MD;  [facility]: [facility];  Service: Cardiovascular;  Laterality: N/A;  Distal RCA  . CORONARY STENT INTERVENTION N/A 11/06/2017   Procedure: CORONARY STENT INTERVENTION;  Surgeon: Kathleene Hazel, MD;  [facility]: [facility] LAB;  Service: Cardiovascular;  Laterality: N/A;  . GROIN DEBRIDEMENT Right 01/07/2018   Procedure: INCISION AND DEBRIDEMENT OF right GROIN WOUND;  Surgeon: Chuck Hint, MD;  Location: [facility] OR;  Service: Vascular;  Laterality: Right;  . INTRAVASCULAR PRESSURE WIRE/FFR STUDY N/A 04/11/2017   Procedure: INTRAVASCULAR PRESSURE WIRE/FFR STUDY;  Surgeon: Yvonne Kendall, MD;  [facility]: [facility];  Service: Cardiovascular;  Laterality: N/A;  . LEFT HEART CATH AND CORS/GRAFTS ANGIOGRAPHY N/A 04/11/2017   Procedure: LEFT HEART CATH AND CORS/GRAFTS ANGIOGRAPHY;  Surgeon: Yvonne Kendall, MD;  [facility];  Service: Cardiovascular;  Laterality: N/A;  . LEFT HEART CATH AND CORS/GRAFTS ANGIOGRAPHY N/A 11/06/2017   Procedure: LEFT HEART CATH AND CORS/GRAFTS ANGIOGRAPHY;  Surgeon: Kathleene Hazel, MD;  [facility]: [facility];  Service: Cardiovascular;  Laterality: N/A;  . LEFT HEART CATHETERIZATION WITH CORONARY ANGIOGRAM N/A 01/18/2014   Procedure: LEFT HEART CATHETERIZATION WITH CORONARY ANGIOGRAM;  Surgeon: Runell Gess, MD;  Location: [facility] CATH LAB;  Service: Cardiovascular;  Laterality: N/A;  . LOWER EXTREMITY ANGIOGRAPHY Bilateral 12/31/2017   Procedure: Lower Extremity Angiography;  Surgeon: Nada Libman, MD;  [facility];  Service: Cardiovascular;  Laterality: Bilateral;  . PERIPHERAL VASCULAR INTERVENTION Left 12/31/2017   Procedure: PERIPHERAL VASCULAR INTERVENTION;  Surgeon: Nada Libman, MD;  [facility];  Service: Cardiovascular;  Laterality: Left;  ext iliac  . TEE WITHOUT CARDIOVERSION N/A 03/19/2016   Procedure: TRANSESOPHAGEAL ECHOCARDIOGRAM (TEE);  Surgeon: Delight Ovens, MD;  Location: [facility] OR;  Service: Open Heart Surgery;  Laterality: N/A;   Family History:  Family History  Problem Relation Age of Onset  . Cancer Mother        Lung cancer  . Coronary artery disease Mother   . Heart failure Maternal Grandmother    Family Psychiatric  History: none Social History:  Social History   Substance and Sexual Activity  Alcohol Use Not Currently  . Alcohol/week: 1.0 standard drinks  . Types: 1 Cans of beer per week   Comment: occ     Social History   Substance and Sexual Activity  Drug Use Not Currently  . Types: Marijuana   Comment: denies 06/17/2015    Social History   Socioeconomic History  . Marital status: Divorced    Spouse name: Not on file  . Number of children: 2  . Years of education: Not on file  . Highest education level: Not on file  Occupational History  . Occupation: Statistician  Social Needs  . Financial resource strain: Not on file  . Food insecurity:    Worry: Not on file    Inability: Not on file  . Transportation needs:    Medical: Not on file    Non-medical: Not on file  Tobacco Use  . Smoking status: Former Smoker    Packs/day: 1.00    Years: 31.00    Pack years: 31.00  Types: Cigarettes  . Smokeless tobacco: Never Used  Substance and Sexual Activity  . Alcohol use: Not Currently    Alcohol/week: 1.0 standard drinks    Types: 1 Cans of beer per week    Comment: occ  . Drug use: Not Currently    Types: Marijuana    Comment: denies 06/17/2015  . Sexual activity: Not Currently  Lifestyle  . Physical activity:    Days per week: Not on file    Minutes per session: Not on file  . Stress: Not on file  Relationships  . Social connections:    Talks on phone: Not on file    Gets together: Not on  file    Attends religious service: Not on file    Active member of club or organization: Not on file    Attends meetings of clubs or organizations: Not on file    Relationship status: Not on file  Other Topics Concern  . Not on file  Social History Narrative   Divorced.  2 sons.  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   Additional Social History:    Allergies:   Allergies  Allergen Reactions  . Ibuprofen Nausea And Vomiting    UPSETS STOMACH  . Shellfish Allergy Nausea And Vomiting    Labs:  Results for orders placed or performed during the hospital encounter of 05/02/18 (from the past 48 hour(s))  Rapid urine drug screen (hospital performed)     Status: None   Collection Time: 05/02/18  9:06 PM  Result Value Ref Range   Opiates NONE DETECTED NONE DETECTED   Cocaine NONE DETECTED NONE DETECTED   Benzodiazepines NONE DETECTED NONE DETECTED   Amphetamines NONE DETECTED NONE DETECTED   Tetrahydrocannabinol NONE DETECTED NONE DETECTED   Barbiturates NONE DETECTED NONE DETECTED    Comment: (NOTE) DRUG SCREEN FOR MEDICAL PURPOSES ONLY.  IF CONFIRMATION IS NEEDED FOR ANY PURPOSE, NOTIFY LAB WITHIN 5 DAYS. LOWEST DETECTABLE LIMITS FOR URINE DRUG SCREEN Drug Class                     Cutoff (ng/mL) Amphetamine and metabolites    1000 Barbiturate and metabolites    200 Benzodiazepine                 200 Tricyclics and metabolites     300 Opiates and metabolites        300 Cocaine and metabolites        300 THC                            50 Performed at [facility], [address] [place], [place] [address]   Comprehensive metabolic panel     Status: Abnormal   Collection Time: 05/02/18  9:16 PM  Result Value Ref Range   Sodium 132 (L) 135 - 145 mmol/L   Potassium 3.9 3.5 - 5.1 mmol/L   Chloride 99 98 - 111 mmol/L   CO2 24 22 - 32 mmol/L   Glucose, Bld 82 70 - 99 mg/dL   BUN 5 (L) 6 - 20 mg/dL   Creatinine, Ser 9.81  0.61 - 1.24 mg/dL   Calcium 8.4 (L) 8.9 - 10.3 mg/dL   Total Protein 6.7 6.5 - 8.1 g/dL   Albumin 3.7 3.5 - 5.0 g/dL   AST 57 (H) 15 - 41 U/L   ALT 36 0 - 44  U/L   Alkaline Phosphatase 138 (H) 38 - 126 U/L   Total Bilirubin 0.5 0.3 - 1.2 mg/dL   GFR calc non Af Amer >60 >60 mL/min   GFR calc Af Amer >60 >60 mL/min   Anion gap 9 5 - 15    Comment: Performed at [facility], [address], [place], [place] [address]  Ethanol     Status: Abnormal   Collection Time: 05/02/18  9:16 PM  Result Value Ref Range   Alcohol, Ethyl (B) 163 (H) <10 mg/dL    Comment: (NOTE) Lowest detectable limit for serum alcohol is 10 mg/dL. For medical purposes only. Performed at [facility], [address], [place], [place] [address]   Salicylate level     Status: None   Collection Time: 05/02/18  9:16 PM  Result Value Ref Range   Salicylate Lvl <7.0 2.8 - 30.0 mg/dL    Comment: Performed at [facility], [address], [place], [place] [address]  Acetaminophen level     Status: Abnormal   Collection Time: 05/02/18  9:16 PM  Result Value Ref Range   Acetaminophen (Tylenol), Serum <10 (L) 10 - 30 ug/mL    Comment: (NOTE) Therapeutic concentrations vary significantly. A range of 10-30 ug/mL  may be an effective concentration for many patients. However, some  are best treated at concentrations outside of this range. Acetaminophen concentrations >150 ug/mL at 4 hours after ingestion  and >50 ug/mL at 12 hours after ingestion are often associated with  toxic reactions. Performed at [facility], [address] [place], [place] [address]   cbc     Status: Abnormal   Collection Time: 05/02/18  9:16 PM  Result Value Ref Range   WBC 10.4 4.0 - 10.5 K/uL   RBC 5.34 4.22 - 5.81 MIL/uL   Hemoglobin 17.7 (H) 13.0 - 17.0 g/dL   HCT 30.8 (H) 65.7 - 84.6 %   MCV 101.5 (H) 80.0 - 100.0 fL   MCH 33.1 26.0 - 34.0 pg   MCHC 32.7 30.0  - 36.0 g/dL   RDW 96.2 95.2 - 84.1 %   Platelets 305 150 - 400 K/uL   nRBC 0.0 0.0 - 0.2 %    Comment: Performed at [facility], [address], [place], [place] [address]  Urinalysis, Routine w reflex microscopic     Status: Abnormal   Collection Time: 05/02/18  9:28 PM  Result Value Ref Range   Color, Urine COLORLESS (A) YELLOW   APPearance CLEAR CLEAR   Specific Gravity, Urine 1.001 (L) 1.005 - 1.030   pH 5.0 5.0 - 8.0   Glucose, UA NEGATIVE NEGATIVE mg/dL   Hgb urine dipstick NEGATIVE NEGATIVE   Bilirubin Urine NEGATIVE NEGATIVE   Ketones, ur NEGATIVE NEGATIVE mg/dL   Protein, ur NEGATIVE NEGATIVE mg/dL   Nitrite NEGATIVE NEGATIVE   Leukocytes,Ua NEGATIVE NEGATIVE    Comment: Performed at [facility], [address] [place], [place] [address]  Lipase, blood     Status: None   Collection Time: 05/02/18  9:52 PM  Result Value Ref Range   Lipase 41 11 - 51 U/L    Comment: Performed at [facility], [address] [place], [place] [address]    Current Facility-Administered Medications  Medication Dose Route Frequency Provider Last Rate Last Dose  . acetaminophen (TYLENOL) tablet 650 mg  650 mg Oral Q6H PRN Curatolo, Adam, DO      .  carvedilol (COREG) tablet 12.5 mg  12.5 mg Oral BID WC Curatolo, Adam, DO   12.5 mg at 05/03/18 0853  . clopidogrel (PLAVIX) tablet 75 mg  75 mg Oral Daily Curatolo, Adam, DO   75 mg at 05/03/18 0853  . LORazepam (ATIVAN) injection 0-4 mg  0-4 mg Intravenous Q6H Curatolo, Adam, DO       Or  . LORazepam (ATIVAN) tablet 0-4 mg  0-4 mg Oral Q6H Curatolo, Adam, DO   1 mg at 05/03/18 0027  . [START ON 05/05/2018] LORazepam (ATIVAN) injection 0-4 mg  0-4 mg Intravenous Q12H Curatolo, Adam, DO       Or  . Melene Muller ON 05/05/2018] LORazepam (ATIVAN) tablet 0-4 mg  0-4 mg Oral Q12H Curatolo, Adam, DO      . rosuvastatin (CRESTOR) tablet 40 mg  40 mg Oral q1800 Curatolo, Adam, DO      . thiamine (VITAMIN  B-1) tablet 100 mg  100 mg Oral Daily Curatolo, Adam, DO       Or  . thiamine (B-1) injection 100 mg  100 mg Intravenous Daily Curatolo, Adam, DO       Current Outpatient Medications  Medication Sig Dispense Refill  . acetaminophen (TYLENOL) 325 MG tablet Take 2 tablets (650 mg total) by mouth every 6 (six) hours as needed for mild pain, fever or headache.    . carvedilol (COREG) 12.5 MG tablet Take 1 tablet (12.5 mg total) by mouth 2 (two) times daily with a meal. 60 tablet 0  . clopidogrel (PLAVIX) 75 MG tablet Take 1 tablet (75 mg total) by mouth daily. 30 tablet 0  . nitroGLYCERIN (NITROSTAT) 0.4 MG SL tablet Place 1 tablet (0.4 mg total) under the tongue every 5 (five) minutes as needed for chest pain. 25 tablet 0  . oxyCODONE (ROXICODONE) 5 MG immediate release tablet Take 1 tablet (5 mg total) by mouth every 6 (six) hours as needed for severe pain. 8 tablet 0  . Pseudoeph-Doxylamine-DM-APAP (NYQUIL PO) Take 30 mLs by mouth at bedtime as needed (cold symptoms).    . rosuvastatin (CRESTOR) 40 MG tablet Take 1 tablet (40 mg total) by mouth daily at 6 PM. 30 tablet 0  . famotidine (PEPCID) 20 MG tablet Take 1 tablet (20 mg total) by mouth 2 (two) times daily. (Patient not taking: Reported on 05/02/2018) 60 tablet 0  . ondansetron (ZOFRAN) 4 MG tablet Take 1 tablet (4 mg total) by mouth every 6 (six) hours. (Patient not taking: Reported on 05/02/2018) 12 tablet 0  . Oxycodone HCl 10 MG TABS Take 1 tablet (10 mg total) by mouth every 6 (six) hours as needed (Moderate or severe pain.). (Patient not taking: Reported on 01/20/2018)      Musculoskeletal: Strength & Muscle Tone: within normal limits Gait & Station: normal Patient leans: N/A  Psychiatric Specialty Exam: Physical Exam  Nursing note and vitals reviewed. Constitutional: He is oriented to person, place, and time. He appears well-developed and well-nourished.  HENT:  Head: Normocephalic.  Neck: Normal range of motion.  Respiratory:  Effort normal.  Musculoskeletal: Normal range of motion.  Neurological: He is alert and oriented to person, place, and time.  Psychiatric: His speech is normal and behavior is normal. Judgment normal. His mood appears anxious. Cognition and memory are normal. He exhibits a depressed mood. He expresses suicidal ideation. He expresses suicidal plans.    Review of Systems  Psychiatric/Behavioral: Positive for depression and substance abuse. The patient is nervous/anxious.   All other  systems reviewed and are negative.   Blood pressure (!) 146/91, pulse (!) 102, temperature 98.3 F (36.8 C), temperature source Oral, resp. rate 20, height 5\' 11"  (1.803 m), weight 68 kg, SpO2 100 %.Body mass index is 20.92 kg/m.  General Appearance: Casual  Eye Contact:  Fair  Speech:  Normal Rate  Volume:  Decreased  Mood:  Anxious and Depressed  Affect:  Congruent  Thought Process:  Coherent and Descriptions of Associations: Intact  Orientation:  Full (Time, Place, and Person)  Thought Content:  Rumination  Suicidal Thoughts:  Yes.  with intent/plan  Homicidal Thoughts:  No  Memory:  Immediate;   Fair Recent;   Fair Remote;   Fair  Judgement:  Poor  Insight:  Fair  Psychomotor Activity:  Decreased  Concentration:  Concentration: Fair and Attention Span: Fair  Recall:  [employer] of Knowledge:  Fair  Language:  Good  Akathisia:  No  Handed:  Right  AIMS (if indicated):     Assets:  Housing Leisure Time Physical Health Resilience  ADL's:  Intact  Cognition:  WNL  Sleep:       Treatment Plan Summary: Daily contact with patient to assess and evaluate symptoms and progress in treatment, Medication management and Plan major depressive disorder, recurrent, severe without psychosis:  -Started Prozac 10 mg daily -Admission to [facility]  Alcohol dependence: -Started CIWA Ativan alcohol detox  HTN: -Restarted Coreg 12.5 mg BID  Hyperlipidemia: -Restarted Crestor 40 mg daily  Platelet  inhibitor: -Restarted Plavix 75 mg daily  Disposition: Recommend psychiatric Inpatient admission when medically cleared.  Nanine Means, NP 05/03/2018 10:18 AM  Patient seen face-to-face for psychiatric evaluation, chart reviewed and case discussed with the physician extender and developed treatment plan. Reviewed the information documented and agree with the treatment plan. Thedore Mins, MD

## 2018-05-03 NOTE — ED Notes (Signed)
Pt alert and cooperative. Pt denies any pain , si,hi, or avh at this time. Pt reports that he's not suicidal as long as he gets the help he need. Pt contract to safety. Pt resting in bed quietly. Pt safe, will continue to monitor.

## 2018-05-04 DIAGNOSIS — F1020 Alcohol dependence, uncomplicated: Secondary | ICD-10-CM

## 2018-05-04 MED ORDER — TRAZODONE HCL 50 MG PO TABS
50.0000 mg | ORAL_TABLET | Freq: Every evening | ORAL | Status: DC | PRN
  Administered 2018-05-04 – 2018-05-05 (×2): 50 mg via ORAL
  Filled 2018-05-04: qty 1
  Filled 2018-05-04: qty 2

## 2018-05-04 MED ORDER — ACETAMINOPHEN 325 MG PO TABS
650.0000 mg | ORAL_TABLET | Freq: Four times a day (QID) | ORAL | Status: DC | PRN
  Administered 2018-05-04 – 2018-05-07 (×5): 650 mg via ORAL
  Filled 2018-05-04 (×2): qty 2
  Filled 2018-05-04: qty 20
  Filled 2018-05-04 (×3): qty 2

## 2018-05-04 NOTE — Progress Notes (Signed)
Patient did attend the evening speaker AA meeting.  

## 2018-05-04 NOTE — [facility] Group Notes (Signed)
[facility] Group Notes:  (Nursing)  Date:  05/04/2018  Time: 1:15 PM Type of Therapy:  Nurse Education  Participation Level:  Active  Participation Quality:  Appropriate and Attentive  Affect:  Appropriate  Cognitive:  Appropriate  Insight:  Appropriate  Engagement in Group:  Engaged  Modes of Intervention:  Education  Summary of Progress/Problems: Group played a non competitive Administrator, arts game that fosters listening skills as well as self expression.   Shela Nevin 05/04/2018, 7:48 PM

## 2018-05-04 NOTE — [facility] Suicide Risk Assessment (Signed)
[facility] Admission Suicide Risk Assessment   Nursing information obtained from:  Patient Demographic factors:  Male Current Mental Status:  NA Loss Factors:  NA Historical Factors:  NA Risk Reduction Factors:  Religious beliefs about death, Employed, Living with another person, especially a relative, Positive social support, Positive therapeutic relationship  Total Time spent with patient: 45 minutes Principal Problem: Alcohol use disorder, severe, dependence (HCC) Diagnosis:  Principal Problem:   Alcohol use disorder, severe, dependence (HCC) Active Problems:   Major depressive disorder, recurrent severe without psychotic features (HCC)  Subjective Data:   Continued Clinical Symptoms:  Alcohol Use Disorder Identification Test Final Score (AUDIT): 28 The "Alcohol Use Disorders Identification Test", Guidelines for Use in Primary Care, Second Edition.  World Science writer [facility]). Score between 0-7:  no or low risk or alcohol related problems. Score between 8-15:  moderate risk of alcohol related problems. Score between 16-19:  high risk of alcohol related problems. Score 20 or above:  warrants further diagnostic evaluation for alcohol dependence and treatment.   CLINICAL FACTORS:  53 year old male, presented to hospital with GPD.  Reports depression and a recent suicidal thoughts, with thoughts of walking into traffic.  Endorses neurovegetative symptoms of depression to include anhedonia, insomnia, poor energy, poor appetite.  No psychotic symptoms.  Attributes worsening depression and relapse in part to grief/sadness following his father's death .  He has a history of alcohol dependence, had relapsed in December after 18 months of sobriety, and had recently been drinking up to 120 ounces of alcohol per day.  He last drank 2 days ago.  Admission BAL 163, UDS negative.  Admission lipase 41 ( within normal limits). Reports history of alcohol withdrawal seizure in the past. He reports a  history of chronic pancreatitis secondary to alcohol abuse, symptoms had abated/improved following an extended period of sobriety as above, but states that he has had exacerbation/recurrence of abdominal pain and discomfort since he started drinking again. He also has a history of hypertension, CAD, CABG in 2018.   Dx-Alcohol Use Disorder, Alcohol Induced Mood Disorder versus MDD Plan-inpatient admission Ativan detox protocol to address alcohol withdrawal Started on Prozac 10 mg daily for depression Continue Plavix 75 mg daily and   Carvedilol 12.5 mg twice daily for history of HTN, CAD Check BMP to monitor electrolytes/ Na+, check BMP , TSH, CBC, Lipase   Musculoskeletal: Strength & Muscle Tone: within normal limits-mild distal tremors, no diaphoresis.  Gait & Station: normal Patient leans: N/A  Psychiatric Specialty Exam: Physical Exam  ROS no headache, no visual disturbances, no chest pain today, no shortness of breath at room air, describes diffuse abdominal discomfort, no vomiting, tolerating p.o. intake well, no rash, no fever  Blood pressure (!) 149/97, pulse 98, temperature 97.9 F (36.6 C), temperature source Oral, resp. rate 18, height 5\' 11"  (1.803 m), weight 68 kg, SpO2 100 %.Body mass index is 20.92 kg/m.  General Appearance: Fairly Groomed  Eye Contact:  Good  Speech:  Normal Rate  Volume:  Normal  Mood:  Depressed  Affect:  Congruent  Thought Process:  Linear and Descriptions of Associations: Intact  Orientation:  Full (Time, Place, and Person)  Thought Content:  No hallucinations, no delusions, not internally preoccupied  Suicidal Thoughts:  No currently denies suicidal or self-injurious ideations, contracts for safety on unit, presents future oriented and expresses interest in going to a rehab at discharge  Homicidal Thoughts:  No does not endorse homicidal or violent ideations  Memory:  Recent and  remote grossly intact  Judgement:  Fair  Insight:  Fair   Psychomotor Activity:  Normal-no current psychomotor agitation or restlessness, mild distal tremors, no diaphoresis noted  Concentration:  Concentration: Good and Attention Span: Good  Recall:  Good  Fund of Knowledge:  Good  Language:  Good  Akathisia:  Negative  Handed:  Right  AIMS (if indicated):     Assets:  Communication Skills Desire for Improvement Resilience  ADL's:  Intact  Cognition:  WNL  Sleep:  Number of Hours: 1.5      COGNITIVE FEATURES THAT CONTRIBUTE TO RISK:  Closed-mindedness and Loss of executive function    SUICIDE RISK:   Moderate:  Frequent suicidal ideation with limited intensity, and duration, some specificity in terms of plans, no associated intent, good self-control, limited dysphoria/symptomatology, some risk factors present, and identifiable protective factors, including available and accessible social support.  PLAN OF CARE: Patient will be admitted to inpatient psychiatric unit for stabilization and safety. Will provide and encourage milieu participation. Provide medication management and maked adjustments as needed.  We will also prescribe medication to manage withdrawal symptoms- will follow daily.    I certify that inpatient services furnished can reasonably be expected to improve the patient's condition.   Craige Cotta, MD 05/04/2018, 5:25 PM

## 2018-05-04 NOTE — [facility] Counselor (Signed)
Adult Comprehensive Assessment  Patient ID: Douglas [name], male   DOB: 05-02-1965, 53 y.o.   MRN: [id number]  Information Source: Information source: Patient  Current Stressors:  Patient states their primary concerns and needs for treatment are:: alcoholism, SI due to drinking heavily; attempted to overdose 2 weeks ago. Patient states their goals for this hospitilization and ongoing recovery are:: "to detox, get sober, and stay sober."  Educational / Learning stressors: high school graduate Employment / Job issues: employed at [employer] unsure if he will keep his job.  Family Relationships: father and mother deceased; unsure if stepfather is alive. no contact with step sister. close to his two sons Surveyor, quantity / Lack of resources (include bankruptcy): no income when not working; no insurance; [employer] / Lack of housing: living in house with 3 roomates.  Physical health (include injuries & life threatening diseases): corony arterry disease; peripheral leg disease, pancreatitis Bereavement / Loss: mother died 7 years ago; father died in 07-10-2017.   Living/Environment/Situation:  Living Arrangements: Non-relatives/Friends Living conditions (as described by patient or guardian): house Who else lives in the home?: 3 roomates. "they all drink." How long has patient lived in current situation?: October 2019 What is atmosphere in current home: Comfortable  Family History:  Marital status: Divorced Divorced, when?: married in 07-10-1993 and divorced in Jul 11, 2002. 9 years of marriage What types of issues is patient dealing with in the relationship?: drinking  Additional relationship information: declined to answer Are you sexually active?: Yes What is your sexual orientation?: heterosexual Has your sexual activity been affected by drugs, alcohol, medication, or emotional stress?: no. Does patient have children?: Yes How many children?: 2 How is patient's relationship with their children?: 21yo son  and 25yo son. "we are close. I haven't told them I'm here. I'm so embarressed."   Childhood History:  By whom was/is the patient raised?: Mother/father and step-parent Additional childhood history information: "It was a beautiful childhood. They were both alcoholics but they were happy." Description of patient's relationship with caregiver when they were a child: close to mother; biological father left after he was diagnosed with MS. She remarried my stepfather. Patient's description of current relationship with people who raised him/her: no relationship with stepfather. "I think he lives in [place]." "My mom was a bad alcoholic when I was an adult. We had a bad relationship up until her death seven years ago."  How were you disciplined when you got in trouble as a child/adolescent?: grounded Does patient have siblings?: Yes Number of Siblings: 1 Description of patient's current relationship with siblings: one stepsister. "no idea where she's at."  Did patient suffer any verbal/emotional/physical/sexual abuse as a child?: No Did patient suffer from severe childhood neglect?: No Has patient ever been sexually abused/assaulted/raped as an adolescent or adult?: No Was the patient ever a victim of a crime or a disaster?: No Witnessed domestic violence?: No Has patient been effected by domestic violence as an adult?: No  Education:  Highest grade of school patient has completed: graduated high school  Currently a Consulting civil engineer?: No Learning disability?: No  Employment/Work Situation:   Employment situation: Employed Where is patient currently employed?: [employer] long has patient been employed?: 3 weeks Patient's job has been impacted by current illness: Yes Describe how patient's job has been impacted: drinking binge. 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 qucikly got up to 4-6 40oz beers daily. throughout the day every day. no drug use. long history of alcohol addiction.  If attempted suicide, did drugs/alcohol play a role in this?: Yes(overdosed on sleeping pills two weeks ago. thoughts to kill himself prior to coming in) Alcohol/Substance Abuse Treatment Hx: Denies past history, Attends AA/NA If yes, describe treatment: history of AA. Has alcohol/substance abuse ever caused legal problems?: No  Social Support System:   Forensic psychologist System: Poor Describe Community Support System: "I have one very close friend who urged me to come to the hospital. He doesn't understand addiction."  Type of faith/religion: Ephriam Knuckles How does patient's faith help to cope with current illness?: "When I feel like praying to God, it helps."  Leisure/Recreation:   Leisure and Hobbies: playing basketball.   Strengths/Needs:   What is the patient's perception of their strengths?: "I've had 2 years of sobriety in the past and know that I can do it again."  Patient states they can use these personal strengths during their treatment to contribute to their recovery: "I know that I need residential treatment."  Patient states these barriers may affect/interfere with their treatment: medical issues. "they are pretty much stable at this point though."  Patient states these barriers may affect their return to the community: none identified by pt Other important information  patient would like considered in planning for their treatment: "I really [facility]." CSW informed pt that there is often times a waitlist.  Discharge Plan:   Currently receiving community mental health services: No Patient states concerns and preferences for aftercare planning are: "I want residential treatment at [facility]."  Patient states they will know when they are safe and ready for discharge when: "when I'm safely detoxed and get into treatment."  Does patient have access to transportation?: Yes([place]) Does patient have financial barriers related to discharge medications?: Yes(limited income; no insurance. ) Patient description of barriers related to discharge medications: no income; no insurance Plan for living situation after discharge: hoping to enter [facility] at discharge. [facility] for outpatient.  Will patient be returning to same living situation after discharge?: No  Summary/Recommendations:   Summary and Recommendations (to be completed by the evaluator): Patient is 53yo male living in [place], [place] with [employer]. Pt presents to the hospital seeking treatment for alcohol abuse, depression, and for medication stabilization. PT reports that his father recently passed away and he relapsed on alcohol 02/2018. Pt reports medical issues including pancreatitis and coranary artery disease. He is employed but may have lost job at [employer] and is unsure of this today. Pt is divorced with two adult sons. He has a diagnosis of MDD, recurrent severe and Alcohol Use Disorder, severe. Recommendation for pt include: crisis stabilization, therapeutic milieu, encourage group attendance and participation, medication management for detox/mood stabilization, and development of comprehensive mental wellness/sobriety plan. CSW assessing for appropriate referrals. Pt is interested in [facility] for ongoing treatment and [facility] for outpatient care.    Rona Ravens LCSW  05/04/2018 2:30 PM

## 2018-05-04 NOTE — Progress Notes (Signed)
D. Pt friendly upon approach, observed attending groups, and interacting well in the milieu. Per pt's self inventory, pt rates his depression, hopelessness and anxiety a 6/3/4, respectively. Pt writes that his most important goal today is to "continue to stay sober" and writes that he will "read the blue book" to help him to achieve that goal. Pt currently denies SI/HI and AVH and agrees to contact staff before acting on any harmful thoughts.  A. Labs and vitals monitored. Pt compliant with medications. Pt supported emotionally and encouraged to express concerns and ask questions.   R. Pt remains safe with 15 minute checks. Will continue POC.

## 2018-05-04 NOTE — H&P (Addendum)
Psychiatric Admission Assessment Adult  Patient Identification: Douglas [name] MRN:  [id number] Date of Evaluation:  05/04/2018 Chief Complaint:  "I reached the point where I can't be an alcoholic anymore." Principal Diagnosis: Alcohol use disorder, severe, dependence ([place]) Diagnosis:  Principal Problem:   Alcohol use disorder, severe, dependence ([place]) Active Problems:   Major depressive disorder, recurrent severe without psychotic features ([place])  History of Present Illness: Douglas [name] is a 53 year old male with history of alcohol use disorder, depression, chronic pancreatitis, HTN, HLD, peripheral vascular disease, CAD with stents and CABG in 2018, presenting voluntarily for treatment of alcohol dependence and suicidal ideation. He reports relapsing on alcohol in December 2019 after 18 months sobriety. He relapsed due to depressed mood after his father passed away. Over the last two weeks he reports drinking 6-8 40 oz beers daily with blackouts. His last drink was on Friday. He became suicidal with plan to run into traffic and called his friend, who urged him to call 911. [facility] brought him to ED. Denies SI on assessment today. He does report history of seizures and DTs while withdrawing. Currently on scheduled Ativan CIWA protocol. He endorses nausea and diarrhea, denies vomiting. Also reports pain for last several weeks similar to pain experienced in the past with past acute pancreatitis. Lipase 41. He would like to go to rehab after discharge. Denies other drug use. UDS negative. BAL 163 on admission. Denies HI, AVH.  Associated Signs/Symptoms: Depression Symptoms:  depressed mood, anhedonia, insomnia, fatigue, suicidal thoughts with specific plan, decreased appetite, (Hypo) Manic Symptoms:  denies Anxiety Symptoms:  Excessive Worry, Psychotic Symptoms:  denies PTSD Symptoms: Negative Total Time spent with patient: 45 minutes  Past Psychiatric History: Long history of depression,  alcohol abuse. Denies history of hospitalizations. One prior suicide attempt two years ago. Denies history of mania.   Is the patient at risk to self? Yes.    Has the patient been a risk to self in the past 6 months? No.  Has the patient been a risk to self within the distant past? Yes.    Is the patient a risk to others? No.  Has the patient been a risk to others in the past 6 months? No.  Has the patient been a risk to others within the distant past? No.   Prior Inpatient Therapy:   Prior Outpatient Therapy:    Alcohol Screening: 1. How often do you have a drink containing alcohol?: 2 to 3 times a week 2. How many drinks containing alcohol do you have on a typical day when you are drinking?: 7, 8, or 9 3. How often do you have six or more drinks on one occasion?: Weekly AUDIT-C Score: 9 4. How often during the last year have you found that you were not able to stop drinking once you had started?: Monthly 5. How often during the last year have you failed to do what was normally expected from you becasue of drinking?: Weekly 6. How often during the last year have you needed a first drink in the morning to get yourself going after a heavy drinking session?: Monthly 7. How often during the last year have you had a feeling of guilt of remorse after drinking?: Daily or almost daily 8. How often during the last year have you been unable to remember what happened the night before because you had been drinking?: Daily or almost daily 9. Have you or someone else been injured as a result of your drinking?: Yes, but not  in the last year 10. Has a relative or friend or a doctor or another health worker been concerned about your drinking or suggested you cut down?: Yes, but not in the last year Alcohol Use Disorder Identification Test Final Score (AUDIT): 28 Alcohol Brief Interventions/Follow-up: Alcohol Education Substance Abuse History in the last 12 months:  Yes.   Consequences of Substance  Abuse: Medical Consequences:  pancreatitis Legal Consequences:  missed work, may lose employment Blackouts:  recent DT's: hx AVH with withdrawal Previous Psychotropic Medications: Yes  Psychological Evaluations: No  Past Medical History:  Past Medical History:  Diagnosis Date  . Anginal pain ([place])   . Coronary artery disease   . GERD (gastroesophageal reflux disease)   . History of acute pancreatitis 04/06/2015  . NSTEMI (non-ST elevated myocardial infarction) ([place]) 01/2014  . Pancreatitis   . PTSD (post-traumatic stress disorder)   . Tobacco abuse     Past Surgical History:  Procedure Laterality Date  . ABDOMINAL AORTOGRAM N/A 12/31/2017   Procedure: ABDOMINAL AORTOGRAM;  Surgeon: Serafina Mitchell, MD;  Location: [facility];  Service: Cardiovascular;  Laterality: N/A;  . ABDOMINAL AORTOGRAM W/LOWER EXTREMITY N/A 11/06/2017   Procedure: ABDOMINAL AORTOGRAM W/LOWER EXTREMITY;  Surgeon: Burnell Blanks, MD;  Location: [facility];  Service: Cardiovascular;  Laterality: N/A;  . CARDIAC CATHETERIZATION N/A 03/14/2016   Procedure: Left Heart Cath and Coronary Angiography;  Surgeon: Burnell Blanks, MD;  Location: [facility];  Service: Cardiovascular;  Laterality: N/A;  . CORONARY ANGIOPLASTY WITH STENT PLACEMENT  01/18/2014   "1"  . CORONARY ARTERY BYPASS GRAFT N/A 03/19/2016   Procedure: CORONARY ARTERY BYPASS GRAFTING (CABG), ON PUMP, TIMES FIVE, USING LEFT INTERNAL MAMMARY ARTERY AND RIGHT GREATER SAPHENOUS VEIN HARVESTED ENDOSCOPICALLY;  Surgeon: Grace Isaac, MD;  Location: [facility];  Service: Open Heart Surgery;  Laterality: N/A;  LIMA-LAD SVG-OM SVG-DIAG SEQ SVG-PD-PL  . CORONARY BALLOON ANGIOPLASTY N/A 04/11/2017   Procedure: CORONARY BALLOON ANGIOPLASTY;  Surgeon: Nelva Bush, MD;  Location: [facility];  Service: Cardiovascular;  Laterality: N/A;  Distal RCA  . CORONARY STENT INTERVENTION N/A 11/06/2017   Procedure: CORONARY STENT  INTERVENTION;  Surgeon: Burnell Blanks, MD;  Location: [facility];  Service: Cardiovascular;  Laterality: N/A;  . GROIN DEBRIDEMENT Right 01/07/2018   Procedure: INCISION AND DEBRIDEMENT OF right GROIN WOUND;  Surgeon: Angelia Mould, MD;  Location: [facility];  Service: Vascular;  Laterality: Right;  . INTRAVASCULAR PRESSURE WIRE/FFR STUDY N/A 04/11/2017   Procedure: INTRAVASCULAR PRESSURE WIRE/FFR STUDY;  Surgeon: Nelva Bush, MD;  Location: [facility];  Service: Cardiovascular;  Laterality: N/A;  . LEFT HEART CATH AND CORS/GRAFTS ANGIOGRAPHY N/A 04/11/2017   Procedure: LEFT HEART CATH AND CORS/GRAFTS ANGIOGRAPHY;  Surgeon: Nelva Bush, MD;  Location: [facility];  Service: Cardiovascular;  Laterality: N/A;  . LEFT HEART CATH AND CORS/GRAFTS ANGIOGRAPHY N/A 11/06/2017   Procedure: LEFT HEART CATH AND CORS/GRAFTS ANGIOGRAPHY;  Surgeon: Burnell Blanks, MD;  Location: [facility];  Service: Cardiovascular;  Laterality: N/A;  . LEFT HEART CATHETERIZATION WITH CORONARY ANGIOGRAM N/A 01/18/2014   Procedure: LEFT HEART CATHETERIZATION WITH CORONARY ANGIOGRAM;  Surgeon: Lorretta Harp, MD;  Location: [facility];  Service: Cardiovascular;  Laterality: N/A;  . LOWER EXTREMITY ANGIOGRAPHY Bilateral 12/31/2017   Procedure: Lower Extremity Angiography;  Surgeon: Serafina Mitchell, MD;  Location: [facility];  Service: Cardiovascular;  Laterality: Bilateral;  . PERIPHERAL  VASCULAR INTERVENTION Left 12/31/2017   Procedure: PERIPHERAL VASCULAR INTERVENTION;  Surgeon: Serafina Mitchell, MD;  Location: [facility];  Service: Cardiovascular;  Laterality: Left;  ext iliac  . TEE WITHOUT CARDIOVERSION N/A 03/19/2016   Procedure: TRANSESOPHAGEAL ECHOCARDIOGRAM (TEE);  Surgeon: Grace Isaac, MD;  Location: [facility];  Service: Open Heart Surgery;  Laterality: N/A;   Family History:  Family History  Problem Relation Age of Onset  . Cancer Mother         Lung cancer  . Coronary artery disease Mother   . Heart failure Maternal Grandmother    Family Psychiatric  History: Denies  Tobacco Screening: Have you used any form of tobacco in the last 30 days? (Cigarettes, Smokeless Tobacco, Cigars, and/or Pipes): Yes Tobacco use, Select all that apply: 5 or more cigarettes per day Are you interested in Tobacco Cessation Medications?: No, patient refused Counseled patient on smoking cessation including recognizing danger situations, developing coping skills and basic information about quitting provided: Refused/Declined practical counseling Social History:  Social History   Substance and Sexual Activity  Alcohol Use Not Currently  . Alcohol/week: 1.0 standard drinks  . Types: 1 Cans of beer per week   Comment: occ     Social History   Substance and Sexual Activity  Drug Use Not Currently  . Types: Marijuana   Comment: denies 06/17/2015    Additional Social History:      Pain Medications: See MAR Prescriptions: See MAR Over the Counter: See MAR History of alcohol / drug use?: Yes Negative Consequences of Use: Financial, Legal, Personal relationships, Work / School Withdrawal Symptoms: Cramps, Irritability, Diarrhea Name of Substance 1: Alcohol.  1 - Age of First Use: UTA 1 - Amount (size/oz): Per chart, pt drank three, 40oz beers today. Pt's BAL was 163 at 2116. 1 - Frequency: Daily. 1 - Duration: Ongoing. 1 - Last Use / Amount: Today (05/02/2018) Name of Substance 2: Cigarettes. 2 - Age of First Use: UTA 2 - Amount (size/oz): Pt reported, smoking a pack and a half of cigarettes daily.  2 - Frequency: Daily.  2 - Duration: Ongoing.  2 - Last Use / Amount: Daily.                 Allergies:   Allergies  Allergen Reactions  . Ibuprofen Nausea And Vomiting    UPSETS STOMACH  . Shellfish Allergy Nausea And Vomiting   Lab Results:  Results for orders placed or performed during the hospital encounter of 05/02/18 (from  the past 48 hour(s))  Rapid urine drug screen (hospital performed)     Status: None   Collection Time: 05/02/18  9:06 PM  Result Value Ref Range   Opiates NONE DETECTED NONE DETECTED   Cocaine NONE DETECTED NONE DETECTED   Benzodiazepines NONE DETECTED NONE DETECTED   Amphetamines NONE DETECTED NONE DETECTED   Tetrahydrocannabinol NONE DETECTED NONE DETECTED   Barbiturates NONE DETECTED NONE DETECTED    Comment: (NOTE) DRUG SCREEN FOR MEDICAL PURPOSES ONLY.  IF CONFIRMATION IS NEEDED FOR ANY PURPOSE, NOTIFY LAB WITHIN 5 DAYS. LOWEST DETECTABLE LIMITS FOR URINE DRUG SCREEN Drug Class                     Cutoff (ng/mL) Amphetamine and metabolites    1000 Barbiturate and metabolites    200 Benzodiazepine                 470 Tricyclics and metabolites  300 Opiates and metabolites        300 Cocaine and metabolites        300 THC                            50 Performed at [facility] [address] [place], [place] [address]   Comprehensive metabolic panel     Status: Abnormal   Collection Time: 05/02/18  9:16 PM  Result Value Ref Range   Sodium 132 (L) 135 - 145 mmol/L   Potassium 3.9 3.5 - 5.1 mmol/L   Chloride 99 98 - 111 mmol/L   CO2 24 22 - 32 mmol/L   Glucose, Bld 82 70 - 99 mg/dL   BUN 5 (L) 6 - 20 mg/dL   Creatinine, Ser 1.04 0.61 - 1.24 mg/dL   Calcium 8.4 (L) 8.9 - 10.3 mg/dL   Total Protein 6.7 6.5 - 8.1 g/dL   Albumin 3.7 3.5 - 5.0 g/dL   AST 57 (H) 15 - 41 U/L   ALT 36 0 - 44 U/L   Alkaline Phosphatase 138 (H) 38 - 126 U/L   Total Bilirubin 0.5 0.3 - 1.2 mg/dL   GFR calc non Af Amer >60 >60 mL/min   GFR calc Af Amer >60 >60 mL/min   Anion gap 9 5 - 15    Comment: Performed at [facility], [place] [address] [place], [place] [address]  Ethanol     Status: Abnormal   Collection Time: 05/02/18  9:16 PM  Result Value Ref Range   Alcohol, Ethyl (B) 163 (H) <10 mg/dL    Comment: (NOTE) Lowest detectable limit for serum  alcohol is 10 mg/dL. For medical purposes only. Performed at [facility], [place] [address] [place], [place] [address]   Salicylate level     Status: None   Collection Time: 05/02/18  9:16 PM  Result Value Ref Range   Salicylate Lvl <4.7 2.8 - 30.0 mg/dL    Comment: Performed at [facility], [place] [address] [place], [place] [address]  Acetaminophen level     Status: Abnormal   Collection Time: 05/02/18  9:16 PM  Result Value Ref Range   Acetaminophen (Tylenol), Serum <10 (L) 10 - 30 ug/mL    Comment: (NOTE) Therapeutic concentrations vary significantly. A range of 10-30 ug/mL  may be an effective concentration for many patients. However, some  are best treated at concentrations outside of this range. Acetaminophen concentrations >150 ug/mL at 4 hours after ingestion  and >50 ug/mL at 12 hours after ingestion are often associated with  toxic reactions. Performed at [facility], [place] [address], [place], [place] [address]   cbc     Status: Abnormal   Collection Time: 05/02/18  9:16 PM  Result Value Ref Range   WBC 10.4 4.0 - 10.5 K/uL   RBC 5.34 4.22 - 5.81 MIL/uL   Hemoglobin 17.7 (H) 13.0 - 17.0 g/dL   HCT 54.2 (H) 39.0 - 52.0 %   MCV 101.5 (H) 80.0 - 100.0 fL   MCH 33.1 26.0 - 34.0 pg   MCHC 32.7 30.0 - 36.0 g/dL   RDW 13.2 11.5 - 15.5 %   Platelets 305 150 - 400 K/uL   nRBC 0.0 0.0 - 0.2 %    Comment: Performed at [facility], [place] [address], [place],  [address]  Urinalysis, Routine w reflex microscopic  Status: Abnormal   Collection Time: 05/02/18  9:28 PM  Result Value Ref Range   Color, Urine COLORLESS (A) YELLOW   APPearance CLEAR CLEAR   Specific Gravity, Urine 1.001 (L) 1.005 - 1.030   pH 5.0 5.0 - 8.0   Glucose, UA NEGATIVE NEGATIVE mg/dL   Hgb urine dipstick NEGATIVE NEGATIVE   Bilirubin Urine NEGATIVE NEGATIVE   Ketones, ur NEGATIVE NEGATIVE mg/dL   Protein, ur NEGATIVE  NEGATIVE mg/dL   Nitrite NEGATIVE NEGATIVE   Leukocytes,Ua NEGATIVE NEGATIVE    Comment: Performed at [facility] [address] [place], [place] [address]  Lipase, blood     Status: None   Collection Time: 05/02/18  9:52 PM  Result Value Ref Range   Lipase 41 11 - 51 U/L    Comment: Performed at [facility], [place] [address], [place], [place] [address]    Blood Alcohol level:  Lab Results  Component Value Date   ETH 163 (H) 05/02/2018   ETH <10 70/35/0093    Metabolic Disorder Labs:  Lab Results  Component Value Date   HGBA1C 5.9 (H) 08/21/2017   MPG 122.63 08/21/2017   MPG 105 03/14/2016   No results found for: PROLACTIN Lab Results  Component Value Date   CHOL 153 03/15/2016   TRIG 70 03/15/2016   HDL 50 03/15/2016   CHOLHDL 3.1 03/15/2016   VLDL 14 03/15/2016   LDLCALC 89 03/15/2016   LDLCALC 71 04/08/2015    Current Medications: Current Facility-Administered Medications  Medication Dose Route Frequency Provider Last Rate Last Dose  . acetaminophen (TYLENOL) tablet 650 mg  650 mg Oral Q6H PRN Connye Burkitt, NP   650 mg at 05/04/18 1018  . alum & mag hydroxide-simeth (MAALOX/MYLANTA) 200-200-20 MG/5ML suspension 30 mL  30 mL Oral Q4H PRN Patrecia Pour, NP      . carvedilol (COREG) tablet 12.5 mg  12.5 mg Oral BID WC Patrecia Pour, NP   12.5 mg at 05/04/18 8182  . clopidogrel (PLAVIX) tablet 75 mg  75 mg Oral Daily Patrecia Pour, NP   75 mg at 05/04/18 0751  . FLUoxetine (PROZAC) capsule 10 mg  10 mg Oral Daily Patrecia Pour, NP   10 mg at 05/04/18 9937  . hydrOXYzine (ATARAX/VISTARIL) tablet 25 mg  25 mg Oral Q6H PRN Derrill Center, NP   25 mg at 05/03/18 2205  . loperamide (IMODIUM) capsule 2-4 mg  2-4 mg Oral PRN Derrill Center, NP      . LORazepam (ATIVAN) tablet 1 mg  1 mg Oral Q6H PRN Derrill Center, NP   1 mg at 05/04/18 0053  . LORazepam (ATIVAN) tablet 1 mg  1 mg Oral QID Derrill Center, NP   1 mg at  05/04/18 1152   Followed by  . [START ON 05/05/2018] LORazepam (ATIVAN) tablet 1 mg  1 mg Oral TID Derrill Center, NP       Followed by  . [START ON 05/06/2018] LORazepam (ATIVAN) tablet 1 mg  1 mg Oral BID Derrill Center, NP       Followed by  . [START ON 05/07/2018] LORazepam (ATIVAN) tablet 1 mg  1 mg Oral Daily Lewis, Tanika N, NP      . magnesium hydroxide (MILK OF MAGNESIA) suspension 30 mL  30 mL Oral Daily PRN Patrecia Pour, NP      . multivitamin with minerals tablet 1 tablet  1 tablet  Oral Daily Derrill Center, NP   1 tablet at 05/04/18 0751  . nicotine (NICODERM CQ - dosed in mg/24 hours) patch 21 mg  21 mg Transdermal Daily Cobos, Myer Peer, MD   21 mg at 05/04/18 0755  . ondansetron (ZOFRAN-ODT) disintegrating tablet 4 mg  4 mg Oral Q6H PRN Derrill Center, NP      . thiamine (B-1) injection 100 mg  100 mg Intramuscular Once Derrill Center, NP      . thiamine (VITAMIN B-1) tablet 100 mg  100 mg Oral Daily Derrill Center, NP   100 mg at 05/04/18 1761   PTA Medications: Medications Prior to Admission  Medication Sig Dispense Refill Last Dose  . acetaminophen (TYLENOL) 325 MG tablet Take 2 tablets (650 mg total) by mouth every 6 (six) hours as needed for mild pain, fever or headache.   Unknown at Unknown time  . carvedilol (COREG) 12.5 MG tablet Take 1 tablet (12.5 mg total) by mouth 2 (two) times daily with a meal. 60 tablet 0 Unknown at Unknown time  . clopidogrel (PLAVIX) 75 MG tablet Take 1 tablet (75 mg total) by mouth daily. 30 tablet 0 Unknown at Unknown time  . famotidine (PEPCID) 20 MG tablet Take 1 tablet (20 mg total) by mouth 2 (two) times daily. (Patient not taking: Reported on 05/02/2018) 60 tablet 0 Unknown at Unknown time  . nitroGLYCERIN (NITROSTAT) 0.4 MG SL tablet Place 1 tablet (0.4 mg total) under the tongue every 5 (five) minutes as needed for chest pain. 25 tablet 0 Unknown at Unknown time  . ondansetron (ZOFRAN) 4 MG tablet Take 1 tablet (4 mg total) by  mouth every 6 (six) hours. (Patient not taking: Reported on 05/02/2018) 12 tablet 0 Unknown at Unknown time  . oxyCODONE (ROXICODONE) 5 MG immediate release tablet Take 1 tablet (5 mg total) by mouth every 6 (six) hours as needed for severe pain. 8 tablet 0 Unknown at Unknown time  . Oxycodone HCl 10 MG TABS Take 1 tablet (10 mg total) by mouth every 6 (six) hours as needed (Moderate or severe pain.). (Patient not taking: Reported on 01/20/2018)   Unknown at Unknown time  . Pseudoeph-Doxylamine-DM-APAP (NYQUIL PO) Take 30 mLs by mouth at bedtime as needed (cold symptoms).   Unknown at Unknown time  . rosuvastatin (CRESTOR) 40 MG tablet Take 1 tablet (40 mg total) by mouth daily at 6 PM. 30 tablet 0 Unknown at Unknown time    Musculoskeletal: Strength & Muscle Tone: within normal limits Gait & Station: normal Patient leans: N/A  Psychiatric Specialty Exam: Physical Exam  Nursing note and vitals reviewed. Constitutional: He is oriented to person, place, and time. He appears well-developed and well-nourished.  Cardiovascular: Normal rate.  Respiratory: Effort normal.  Neurological: He is alert and oriented to person, place, and time.    Review of Systems  Constitutional: Negative.   Respiratory: Negative for cough and shortness of breath.   Cardiovascular: Negative for chest pain.  Gastrointestinal: Positive for abdominal pain, diarrhea and nausea. Negative for vomiting.  Neurological: Negative for headaches.  Psychiatric/Behavioral: Positive for depression, substance abuse (ETOH) and suicidal ideas. Negative for hallucinations and memory loss. The patient has insomnia. The patient is not nervous/anxious.     Blood pressure (!) 141/96, pulse 89, temperature 97.9 F (36.6 C), temperature source Oral, resp. rate 18, height '5\' 11"'  (1.803 m), weight 68 kg, SpO2 100 %.Body mass index is 20.92 kg/m.  See MD's admission SRA  Treatment Plan Summary: Daily contact with patient to assess and  evaluate symptoms and progress in treatment and Medication management   Inpatient hospitalization.  Continue Ativan CIWA protocol Continue Prozac 10 mg PO daily for mood Continue Coreg 12.5 mg PO BID for HTN Continue Plavix 75 mg PO daily for anticoagulation Continue Vistaril 25 mg PO Q6HR PRN anxiety Start trazodone 50 mg PO QHS PRN insomnia Continue Imodium PRN diarrhea Continue Zofran ODT 4 mg PO Q6HR PRN N/V  Patient will participate in the therapeutic group milieu.  Discharge disposition in progress.   Observation Level/Precautions:  15 minute checks  Laboratory:  HbAIC, amylase, lipase, lipid panel  Psychotherapy:  Group therapy  Medications:  See MAR  Consultations:  PRN  Discharge Concerns:  Safety and stabilization  Estimated LOS: 3-5 days  Other:     Physician Treatment Plan for Primary Diagnosis: Alcohol use disorder, severe, dependence ([place]) Long Term Goal(s): Improvement in symptoms so as ready for discharge  Short Term Goals: Ability to identify changes in lifestyle to reduce recurrence of condition will improve, Ability to verbalize feelings will improve and Ability to disclose and discuss suicidal ideas  Physician Treatment Plan for Secondary Diagnosis: Principal Problem:   Alcohol use disorder, severe, dependence ([place]) Active Problems:   Major depressive disorder, recurrent severe without psychotic features ([place])  Long Term Goal(s): Improvement in symptoms so as ready for discharge  Short Term Goals: Ability to demonstrate self-control will improve, Ability to identify and develop effective coping behaviors will improve and Ability to identify triggers associated with substance abuse/mental health issues will improve  I certify that inpatient services furnished can reasonably be expected to improve the patient's condition.    Connye Burkitt, NP 2/23/20201:14 PM   I have discussed case with NP and have met with patient  Agree with NP note and assessment   53 year old male, presented to hospital with [facility].  Reports depression and a recent suicidal thoughts, with thoughts of walking into traffic.  Endorses neurovegetative symptoms of depression to include anhedonia, insomnia, poor energy, poor appetite.  No psychotic symptoms.  Attributes worsening depression and relapse in part to grief/sadness following his father's death .  He has a history of alcohol dependence, had relapsed in December after 18 months of sobriety, and had recently been drinking up to 120 ounces of alcohol per day.  He last drank 2 days ago.  Admission BAL 163, UDS negative.  Admission lipase 41 ( within normal limits). Reports history of alcohol withdrawal seizure in the past. He reports a history of chronic pancreatitis secondary to alcohol abuse, symptoms had abated/improved following an extended period of sobriety as above, but states that he has had exacerbation/recurrence of abdominal pain and discomfort since he started drinking again. He also has a history of hypertension, CAD, CABG in 2018.   Dx-Alcohol Use Disorder, Alcohol Induced Mood Disorder versus MDD Plan-inpatient admission Ativan detox protocol to address alcohol withdrawal Started on Prozac 10 mg daily for depression Continue Plavix 75 mg daily and   Carvedilol 12.5 mg twice daily for history of HTN, CAD Check BMP to monitor electrolytes/ Na+, check BMP , TSH, CBC, Lipase

## 2018-05-04 NOTE — [facility] Group Notes (Addendum)
[facility] LCSW Group Therapy Note  05/04/2018   10:00-11:00AM  Type of Therapy and Topic:  Group Therapy:  Unhealthy versus Healthy Supports, Which Am I?  Participation Level:  Active   Description of Group:  Patients in this group were introduced to the concept that additional supports including self-support are an essential part of recovery.  Initially a discussion was held about the differences between healthy versus unhealthy supports.  Patients were asked to share what unhealthy supports in their lives need to be addressed, as well as what additional healthy supports could be added for greater help in reaching their goals.   A song entitled "My Own Hero" was played and a group discussion ensued in which patients stated they could relate to the song and it inspired them to realize they have be willing to help themselves in order to succeed, because other people cannot achieve sobriety or stability for them.  We discussed adding a variety of healthy supports to address the various needs in patient lives, including becoming more self-supportive.  Therapeutic Goals: 1)  Highlight the differences between healthy and unhealthy supports 2)  Suggest the importance of being a part of one's own support system 2)  Discuss reasons people in one's life may eventually be unable to be continually supportive  3)  Identify the patient's current support system and   4)  elicit commitments to add healthy supports and to become more conscious of being self-supportive   Summary of Patient Progress:  The patient expressed that the unhealthy support which needs to be addressed includes himself and his toxic friends.  Healthy supports which could be added for increased stability and happiness include returning to alcoholics anonymous and getting a sponsor.  Therapeutic Modalities:   Motivational Interviewing Activity  Lynnell Chad

## 2018-05-05 LAB — LIPID PANEL
Cholesterol: 127 mg/dL (ref 0–200)
HDL: 45 mg/dL (ref >40)
LDL Cholesterol: 69 mg/dL (ref 0–99)
Total CHOL/HDL Ratio: 2.8 RATIO
Triglycerides: 66 mg/dL (ref <150)
VLDL: 13 mg/dL (ref 0–40)

## 2018-05-05 LAB — HEMOGLOBIN A1C
Hgb A1c MFr Bld: 5.5 % (ref 4.8–5.6)
Mean Plasma Glucose: 111.15 mg/dL

## 2018-05-05 LAB — LIPASE, BLOOD: Lipase: 46 U/L (ref 11–51)

## 2018-05-05 LAB — AMYLASE: Amylase: 82 U/L (ref 28–100)

## 2018-05-05 LAB — CBC WITH DIFFERENTIAL/PLATELET
Abs Immature Granulocytes: 0.04 10*3/uL (ref 0.00–0.07)
Basophils Absolute: 0.1 10*3/uL (ref 0.0–0.1)
Basophils Relative: 1 %
Eosinophils Absolute: 0.2 10*3/uL (ref 0.0–0.5)
Eosinophils Relative: 2 %
HCT: 44.2 % (ref 39.0–52.0)
HEMOGLOBIN: 14.6 g/dL (ref 13.0–17.0)
Immature Granulocytes: 1 %
Lymphocytes Relative: 20 %
Lymphs Abs: 1.6 10*3/uL (ref 0.7–4.0)
MCH: 34 pg (ref 26.0–34.0)
MCHC: 33 g/dL (ref 30.0–36.0)
MCV: 102.8 fL — ABNORMAL HIGH (ref 80.0–100.0)
Monocytes Absolute: 0.6 10*3/uL (ref 0.1–1.0)
Monocytes Relative: 7 %
NRBC: 0 % (ref 0.0–0.2)
Neutro Abs: 5.6 10*3/uL (ref 1.7–7.7)
Neutrophils Relative %: 69 %
Platelets: 219 10*3/uL (ref 150–400)
RBC: 4.3 MIL/uL (ref 4.22–5.81)
RDW: 13.1 % (ref 11.5–15.5)
WBC: 8.1 10*3/uL (ref 4.0–10.5)

## 2018-05-05 LAB — BASIC METABOLIC PANEL
Anion gap: 7 (ref 5–15)
BUN: <5 mg/dL — ABNORMAL LOW (ref 6–20)
CO2: 24 mmol/L (ref 22–32)
Calcium: 8.1 mg/dL — ABNORMAL LOW (ref 8.9–10.3)
Chloride: 106 mmol/L (ref 98–111)
Creatinine, Ser: 0.93 mg/dL (ref 0.61–1.24)
GFR calc Af Amer: >60 mL/min (ref >60)
GFR calc non Af Amer: >60 mL/min (ref >60)
Glucose, Bld: 160 mg/dL — ABNORMAL HIGH (ref 70–99)
Potassium: 3.4 mmol/L — ABNORMAL LOW (ref 3.5–5.1)
Sodium: 137 mmol/L (ref 135–145)

## 2018-05-05 LAB — TSH: TSH: 5.171 u[IU]/mL — ABNORMAL HIGH (ref 0.350–4.500)

## 2018-05-05 MED ORDER — FAMOTIDINE 20 MG PO TABS
20.0000 mg | ORAL_TABLET | Freq: Every day | ORAL | Status: DC
  Administered 2018-05-05 – 2018-05-09 (×5): 20 mg via ORAL
  Filled 2018-05-05: qty 28
  Filled 2018-05-05 (×6): qty 1
  Filled 2018-05-05: qty 28
  Filled 2018-05-05: qty 1

## 2018-05-05 MED ORDER — FAMOTIDINE 20 MG PO TABS: 20.0000 mg | ORAL_TABLET | Freq: Every day | ORAL | Status: DC

## 2018-05-05 MED ORDER — TRAZODONE HCL 50 MG PO TABS
50.0000 mg | ORAL_TABLET | ORAL | Status: AC
  Administered 2018-05-06: 50 mg via ORAL
  Filled 2018-05-05 (×2): qty 1

## 2018-05-05 NOTE — Plan of Care (Signed)
Nurse discussed anxiety, depression, coping skills with patient. 

## 2018-05-05 NOTE — [facility] Group Notes (Signed)
LCSW Group Therapy Notes 05/05/2018 12:57 PM  Type of Therapy and Topic: Group Therapy: Overcoming Obstacles  Participation Level: Active  Description of Group:  In this group patients will be encouraged to explore what they see as obstacles to their own wellness and recovery. They will be guided to discuss their thoughts, feelings, and behaviors related to these obstacles. The group will process together ways to cope with barriers, with attention given to specific choices patients can make. Each patient will be challenged to identify changes they are motivated to make in order to overcome their obstacles. This group will be process-oriented, with patients participating in exploration of their own experiences as well as giving and receiving support and challenge from other group members.  Therapeutic Goals: 1. Patient will identify personal and current obstacles as they relate to admission. 2. Patient will identify barriers that currently interfere with their wellness or overcoming obstacles.  3. Patient will identify feelings, thought process and behaviors related to these barriers. 4. Patient will identify two changes they are willing to make to overcome these obstacles:   Summary of Patient Progress Patient stated alcohol use is obstacle he experienced prior to admission. Patient discussed that trying to get into long term residential treatment is a current obstacle he is facing. He reports he feels depressed and anxious knowing he may not be able to discharge directly to a residential treatment facility and he feels situational suicidal ideation as a result.   Therapeutic Modalities:  Cognitive Behavioral Therapy Solution Focused Therapy Motivational Interviewing Relapse Prevention Therapy  Enid Cutter, MSW, [facility] 05/05/2018 12:57 PM

## 2018-05-05 NOTE — Progress Notes (Addendum)
D: Patient observed in AA this evening, visible in the milieu. Patient states, "I"m feeling a bit better. I've done this before. I just need to get back on track with meetings. I'm still having pain in my pancreas but I guess you can't get me anything for it? The doctor was going to run some labs. I take hydrocodone at home." Patient's affect flat, anxious with congruent mood. Patient reports withdrawal symptoms - slight tremor, anxiety.  A: Medicated per orders, prn trazadone given for sleep. Medication education provided. Level III obs in place for safety. Emotional support offered. Patient encouraged to complete Suicide Safety Plan before discharge. Encouraged to attend and participate in unit programming.  Fall prevention plan in place and reviewed with patient as pt is a high fall risk due to detox. Reviewed labs ordered for AM.  R: Patient verbalizes understanding of POC, falls prevention education. On reassess, patient resting in bed. Patient denies SI/HI/AVH and remains safe on level III obs. Will continue to monitor throughout the night.

## 2018-05-05 NOTE — Progress Notes (Signed)
Pt attended AA group this evening.  

## 2018-05-05 NOTE — [facility] Suicide Risk Assessment (Signed)
[facility] INPATIENT:  Family/Significant Other Suicide Prevention Education  Suicide Prevention Education:  Education Completed; ex mother in law Douglas [name] [phone] has been identified by the patient as the family member/significant other with whom the patient will be residing, and identified as the person(s) who will aid the patient in the event of a mental health crisis (suicidal ideations/suicide attempt).  With written consent from the patient, the family member/significant other has been provided the following suicide prevention education, prior to the and/or following the discharge of the patient.  The suicide prevention education provided includes the following:  Suicide risk factors  Suicide prevention and interventions  National Suicide Hotline telephone number  [facility] assessment telephone number  [facility] Emergency Assistance 911  [facility] and/or Residential Mobile Crisis Unit telephone number  Request made of family/significant other to:  Remove weapons (e.g., guns, rifles, knives), all items previously/currently identified as safety concern.    Remove drugs/medications (over-the-counter, prescriptions, illicit drugs), all items previously/currently identified as a safety concern.  The family member/significant other verbalizes understanding of the suicide prevention education information provided.  The family member/significant other agrees to remove the items of safety concern listed above.  Douglas Bonds is worried about the patient discharging from the hospital and having no stable place to live or source of income. Douglas Bonds has the patient's two adult children living with her and she states that she is unfortunately not able to financially support the patient or have the patient come live with her.   She is no safety concerns for the patient discharging. She does not believe the patient would attempt suicide due to his strongly held religious  beliefs and his two sons.   Douglas [name] 05/05/2018, 3:03 PM

## 2018-05-05 NOTE — Progress Notes (Signed)
Patient attended grief and loss group facilitated by Burnis Kingfisher, Mdiv, [facility], and Ethel Rana, MS, [facility], [facility].   Group focuses on change and loss experienced by group members; topics include loss due to death, loss of relationships, loss of a place, loss of sense of self, etc. Group members are invited to share the changes and losses that are currently affecting their lives. Facilitators tie together shared experiences between group members and validate emotions felt by participants.  Patient JD attended and participated in group discussion. Pt arrived approximately 30 minutes late and reported he was asleep and unaware group was occurring. Pt shared about his experience with grief and alcohol and how his alcohol use increased following the loss of his daughter when his wife was 7.5 months pregnant. He reported the alcohol use continuing to escalate and that he eventually began attending AA and moved into an [place] house. Pt reported in 12/22/19his godfather passed away and he began drinking again. Pt expressed a desire and motivation to return to a long-term recovery setting and to work on abstinence from alcohol. He reports that alcoholism is his "allergy" and that he needs to treat it. Pt connected his experiences with others in the group. Pt shared the song "That's Why I'm Here" by Renee Pain and how it is about attending an AA meeting; pt reported finding comfort in listening to this song. Pt cannot return to his previous residence and is hoping to find a residential treatment setting he can move into.   Ethel Rana, MS, [facility], N[facility]

## 2018-05-05 NOTE — Progress Notes (Signed)
Recreation Therapy Notes  Date:  2. 53. 20 Time: 0930 Location: [address] Dayroom  Group Topic: Stress Management  Goal Area(s) Addresses:  Patient will identify positive stress management techniques. Patient will identify benefits of using stress management post d/c.  Intervention: Worksheet  Activity :  Choice Meditation.  LRT introduced the stress management technique of meditation.  LRT played a meditation that focused on having a choice in changing the way they think about things.  Patients were to follow along as meditation played to engage in activity.  Education:  Stress Management, Discharge Planning.   Education Outcome: Acknowledges Education  Clinical Observations/Feedback: Pt did not attend group.     Caroll Rancher, LRT/CTRS         Caroll Rancher A 05/05/2018 10:56 AM

## 2018-05-05 NOTE — Tx Team (Signed)
Interdisciplinary Treatment and Diagnostic Plan Update  05/05/2018 Time of Session: 9:00am Douglas [name] MRN: [id number]  Principal Diagnosis: Alcohol use disorder, severe, dependence (HCC)  Secondary Diagnoses: Principal Problem:   Alcohol use disorder, severe, dependence (HCC) Active Problems:   Major depressive disorder, recurrent severe without psychotic features (HCC)   Current Medications:  Current Facility-Administered Medications  Medication Dose Route Frequency Provider Last Rate Last Dose  . acetaminophen (TYLENOL) tablet 650 mg  650 mg Oral Q6H PRN Aldean Baker, NP   650 mg at 05/04/18 1018  . alum & mag hydroxide-simeth (MAALOX/MYLANTA) 200-200-20 MG/5ML suspension 30 mL  30 mL Oral Q4H PRN Charm Rings, NP      . carvedilol (COREG) tablet 12.5 mg  12.5 mg Oral BID WC Charm Rings, NP   12.5 mg at 05/05/18 0804  . clopidogrel (PLAVIX) tablet 75 mg  75 mg Oral Daily Charm Rings, NP   75 mg at 05/05/18 7939  . FLUoxetine (PROZAC) capsule 10 mg  10 mg Oral Daily Charm Rings, NP   10 mg at 05/05/18 0804  . hydrOXYzine (ATARAX/VISTARIL) tablet 25 mg  25 mg Oral Q6H PRN Oneta Rack, NP   25 mg at 05/03/18 2205  . loperamide (IMODIUM) capsule 2-4 mg  2-4 mg Oral PRN Oneta Rack, NP      . LORazepam (ATIVAN) tablet 1 mg  1 mg Oral Q6H PRN Oneta Rack, NP   1 mg at 05/04/18 0053  . LORazepam (ATIVAN) tablet 1 mg  1 mg Oral TID Oneta Rack, NP   1 mg at 05/05/18 0300   Followed by  . [START ON 05/06/2018] LORazepam (ATIVAN) tablet 1 mg  1 mg Oral BID Oneta Rack, NP       Followed by  . [START ON 05/07/2018] LORazepam (ATIVAN) tablet 1 mg  1 mg Oral Daily Lewis, Tanika N, NP      . magnesium hydroxide (MILK OF MAGNESIA) suspension 30 mL  30 mL Oral Daily PRN Charm Rings, NP      . multivitamin with minerals tablet 1 tablet  1 tablet Oral Daily Oneta Rack, NP   1 tablet at 05/05/18 0805  . nicotine (NICODERM CQ - dosed in mg/24 hours)  patch 21 mg  21 mg Transdermal Daily Cobos, Rockey Situ, MD   21 mg at 05/05/18 0806  . ondansetron (ZOFRAN-ODT) disintegrating tablet 4 mg  4 mg Oral Q6H PRN Oneta Rack, NP      . thiamine (B-1) injection 100 mg  100 mg Intramuscular Once Oneta Rack, NP      . thiamine (VITAMIN B-1) tablet 100 mg  100 mg Oral Daily Oneta Rack, NP   100 mg at 05/05/18 0805  . traZODone (DESYREL) tablet 50 mg  50 mg Oral QHS PRN Aldean Baker, NP   50 mg at 05/04/18 2123   PTA Medications: Medications Prior to Admission  Medication Sig Dispense Refill Last Dose  . acetaminophen (TYLENOL) 325 MG tablet Take 2 tablets (650 mg total) by mouth every 6 (six) hours as needed for mild pain, fever or headache.   Unknown at Unknown time  . carvedilol (COREG) 12.5 MG tablet Take 1 tablet (12.5 mg total) by mouth 2 (two) times daily with a meal. 60 tablet 0 Unknown at Unknown time  . clopidogrel (PLAVIX) 75 MG tablet Take 1 tablet (75 mg total) by mouth daily. 30 tablet 0 Unknown  at Unknown time  . famotidine (PEPCID) 20 MG tablet Take 1 tablet (20 mg total) by mouth 2 (two) times daily. (Patient not taking: Reported on 05/02/2018) 60 tablet 0 Unknown at Unknown time  . nitroGLYCERIN (NITROSTAT) 0.4 MG SL tablet Place 1 tablet (0.4 mg total) under the tongue every 5 (five) minutes as needed for chest pain. 25 tablet 0 Unknown at Unknown time  . ondansetron (ZOFRAN) 4 MG tablet Take 1 tablet (4 mg total) by mouth every 6 (six) hours. (Patient not taking: Reported on 05/02/2018) 12 tablet 0 Unknown at Unknown time  . oxyCODONE (ROXICODONE) 5 MG immediate release tablet Take 1 tablet (5 mg total) by mouth every 6 (six) hours as needed for severe pain. 8 tablet 0 Unknown at Unknown time  . Oxycodone HCl 10 MG TABS Take 1 tablet (10 mg total) by mouth every 6 (six) hours as needed (Moderate or severe pain.). (Patient not taking: Reported on 01/20/2018)   Unknown at Unknown time  . Pseudoeph-Doxylamine-DM-APAP (NYQUIL  PO) Take 30 mLs by mouth at bedtime as needed (cold symptoms).   Unknown at Unknown time  . rosuvastatin (CRESTOR) 40 MG tablet Take 1 tablet (40 mg total) by mouth daily at 6 PM. 30 tablet 0 Unknown at Unknown time    Patient Stressors: Educational concerns Financial difficulties Health problems  Patient Strengths: Ability for insight Active sense of humor Average or above average intelligence  Treatment Modalities: Medication Management, Group therapy, Case management,  1 to 1 session with clinician, Psychoeducation, Recreational therapy.   Physician Treatment Plan for Primary Diagnosis: Alcohol use disorder, severe, dependence (HCC) Long Term Goal(s): Improvement in symptoms so as ready for discharge Improvement in symptoms so as ready for discharge   Short Term Goals: Ability to identify changes in lifestyle to reduce recurrence of condition will improve Ability to verbalize feelings will improve Ability to disclose and discuss suicidal ideas Ability to demonstrate self-control will improve Ability to identify and develop effective coping behaviors will improve Ability to identify triggers associated with substance abuse/mental health issues will improve  Medication Management: Evaluate patient's response, side effects, and tolerance of medication regimen.  Therapeutic Interventions: 1 to 1 sessions, Unit Group sessions and Medication administration.  Evaluation of Outcomes: Progressing  Physician Treatment Plan for Secondary Diagnosis: Principal Problem:   Alcohol use disorder, severe, dependence (HCC) Active Problems:   Major depressive disorder, recurrent severe without psychotic features (HCC)  Long Term Goal(s): Improvement in symptoms so as ready for discharge Improvement in symptoms so as ready for discharge   Short Term Goals: Ability to identify changes in lifestyle to reduce recurrence of condition will improve Ability to verbalize feelings will  improve Ability to disclose and discuss suicidal ideas Ability to demonstrate self-control will improve Ability to identify and develop effective coping behaviors will improve Ability to identify triggers associated with substance abuse/mental health issues will improve     Medication Management: Evaluate patient's response, side effects, and tolerance of medication regimen.  Therapeutic Interventions: 1 to 1 sessions, Unit Group sessions and Medication administration.  Evaluation of Outcomes: Progressing   RN Treatment Plan for Primary Diagnosis: Alcohol use disorder, severe, dependence (HCC) Long Term Goal(s): Knowledge of disease and therapeutic regimen to maintain health will improve  Short Term Goals: Ability to demonstrate self-control, Ability to verbalize feelings will improve and Ability to identify and develop effective coping behaviors will improve  Medication Management: RN will administer medications as ordered by provider, will assess and evaluate patient's  response and provide education to patient for prescribed medication. RN will report any adverse and/or side effects to prescribing provider.  Therapeutic Interventions: 1 on 1 counseling sessions, Psychoeducation, Medication administration, Evaluate responses to treatment, Monitor vital signs and CBGs as ordered, Perform/monitor CIWA, COWS, AIMS and Fall Risk screenings as ordered, Perform wound care treatments as ordered.  Evaluation of Outcomes: Progressing   LCSW Treatment Plan for Primary Diagnosis: Alcohol use disorder, severe, dependence (HCC) Long Term Goal(s): Safe transition to appropriate next level of care at discharge, Engage patient in therapeutic group addressing interpersonal concerns.  Short Term Goals: Engage patient in aftercare planning with referrals and resources, Increase social support, Identify triggers associated with mental health/substance abuse issues and Increase skills for wellness and  recovery  Therapeutic Interventions: Assess for all discharge needs, 1 to 1 time with Social worker, Explore available resources and support systems, Assess for adequacy in community support network, Educate family and significant other(s) on suicide prevention, Complete Psychosocial Assessment, Interpersonal group therapy.  Evaluation of Outcomes: Progressing   Progress in Treatment: Attending groups: Yes. Participating in groups: Yes. Taking medication as prescribed: Yes. Toleration medication: Yes. Family/Significant other contact made: No, will contact:  patient provided contact information for his ex-mother in law, but the voicemail does not match up. CSW to complete with patient Patient understands diagnosis: Yes. Discussing patient identified problems/goals with staff: Yes. Medical problems stabilized or resolved: No. Denies suicidal/homicidal ideation: No. Issues/concerns per patient self-inventory: Yes.  New problem(s) identified: No, Describe:  CSW continuing to assess  New Short Term/Long Term Goal(s): detox, medication management for mood stabilization; elimination of SI thoughts; development of comprehensive mental wellness/sobriety plan.  Patient Goals: "to detox, get sober, and stay sober."   Discharge Plan or Barriers: Patient requesting referral to [facility] residential. [facility] pamphlet, Mobile Crisis information, and AA/NA information provided to patient for additional community support and resources.   Reason for Continuation of Hospitalization: Anxiety Depression Suicidal ideation  Estimated Length of Stay: 1-3 days  Attendees: Patient: Douglas [name] 05/05/2018 8:36 AM  Physician:  05/05/2018 8:36 AM  Nursing:  05/05/2018 8:36 AM  RN Care Manager: 05/05/2018 8:36 AM  Social Worker: Enid Cutter, Theresia Majors 05/05/2018 8:36 AM  Recreational Therapist:  05/05/2018 8:36 AM  Other:  05/05/2018 8:36 AM  Other:  05/05/2018 8:36 AM  Other: 05/05/2018 8:36 AM    Scribe for  Treatment Team: Darreld Mclean, LCSWA 05/05/2018 8:36 AM

## 2018-05-05 NOTE — Progress Notes (Addendum)
D:  Patient's self inventory sheet, patient has poor sleep, sleep medication not helpful.  Fair appetite, low energy level, good concentration.  Rated depression 5, hopeless 4, anxiety 7.  Withdrawals, tremors, diarrhea, cramping, sweating.  Denied SI.  Physical problems, pancreas, worst pain #8.  Goal is getting life back.  Plans to listen to leaders.  No discharge plans. A:  Medications administered per MD orders.  Emotional support and encouragement given patient. R:  Denied SI and HI, contracts for safety.  Patient stated sometimes he sees something crawling on ceiling.  Voices tell him "you're going to die"   Safety maintained with 15 minute checks. Patient stated he only slept 2 hours last night.  Tylenol does not touch his pancreatic pain. No complaints of pain this afternoon.

## 2018-05-05 NOTE — Progress Notes (Addendum)
[facility] MD Progress Note  05/05/2018 5:14 PM Douglas [name]  MRN:  [id number] Subjective: Patient reports some improvement compared to admission. Reports mildly improved but persistent abdominal discomfort, nausea, which he attributes to history of chronic pancreatitis, exacerbated by recent alcohol consumption. States he has  been able to tolerate p.o. intake but "only a little bit of food" Denies suicidal ideations.  Does not endorse medication side effects.  Objective: I have discussed case with treatment team and have met with patient.  53 year old male, presented to hospital with worsening depression, suicidal thoughts of walking into traffic, neurovegetative symptoms.  Reports he had relapsed on alcohol and has been drinking up to 120 ounces of beer per day.  Admission BAL 163.  Reports death of his father was a significant contributor to worsening depression and increased alcohol consumption.  He has a history of chronic pancreatitis secondary to alcohol use disorder and a history of CAD.  Today patient reports lingering depression, denies suicidal ideations, contracts for safety on unit.  Presents with mild distal tremors, no diaphoresis, no psychomotor restlessness, BP 149/98, pulse 100  Labs reviewed-lipase 46, amylase 82 (WNL) WBC 8.1  TSH mildly elevated at 5.17  Behavior on unit in good control, has been going to some groups.  He describes persistent lower abdominal discomfort, some nausea. Able to sit comfortably during our session and noted to ambulate without visible  distress .  I have reviewed case with hospitalist consultant regarding history of pancreatitis and persistent pain. Recommendation is to encourage liquid diet , and recheck Lipase in AM. Will also start Pepcid to address potential gastritis /GERD symptoms  Principal Problem: Alcohol use disorder, severe, dependence ([place]) Diagnosis: Principal Problem:   Alcohol use disorder, severe, dependence ([place]) Active Problems:    Major depressive disorder, recurrent severe without psychotic features ([place])  Total Time spent with patient: 20 minutes  Past Psychiatric History:   Past Medical History:  Past Medical History:  Diagnosis Date  . Anginal pain ([place])   . Coronary artery disease   . GERD (gastroesophageal reflux disease)   . History of acute pancreatitis 04/06/2015  . NSTEMI (non-ST elevated myocardial infarction) ([place]) 01/2014  . Pancreatitis   . PTSD (post-traumatic stress disorder)   . Tobacco abuse     Past Surgical History:  Procedure Laterality Date  . ABDOMINAL AORTOGRAM N/A 12/31/2017   Procedure: ABDOMINAL AORTOGRAM;  Surgeon: Serafina Mitchell, MD;  Location: [place] [facility];  Service: Cardiovascular;  Laterality: N/A;  . ABDOMINAL AORTOGRAM W/LOWER EXTREMITY N/A 11/06/2017   Procedure: ABDOMINAL AORTOGRAM W/LOWER EXTREMITY;  Surgeon: Burnell Blanks, MD;  Location: [place] [facility];  Service: Cardiovascular;  Laterality: N/A;  . CARDIAC CATHETERIZATION N/A 03/14/2016   Procedure: Left Heart Cath and Coronary Angiography;  Surgeon: Burnell Blanks, MD;  Location: [place] [facility];  Service: Cardiovascular;  Laterality: N/A;  . CORONARY ANGIOPLASTY WITH STENT PLACEMENT  01/18/2014   "1"  . CORONARY ARTERY BYPASS GRAFT N/A 03/19/2016   Procedure: CORONARY ARTERY BYPASS GRAFTING (CABG), ON PUMP, TIMES FIVE, USING LEFT INTERNAL MAMMARY ARTERY AND RIGHT GREATER SAPHENOUS VEIN HARVESTED ENDOSCOPICALLY;  Surgeon: Grace Isaac, MD;  Location: [place];  Service: Open Heart Surgery;  Laterality: N/A;  LIMA-LAD SVG-OM SVG-DIAG SEQ SVG-PD-PL  . CORONARY BALLOON ANGIOPLASTY N/A 04/11/2017   Procedure: CORONARY BALLOON ANGIOPLASTY;  Surgeon: Nelva Bush, MD;  Location: [place] [facility];  Service: Cardiovascular;  Laterality: N/A;  Distal RCA  . CORONARY STENT INTERVENTION N/A  11/06/2017   Procedure: CORONARY STENT INTERVENTION;  Surgeon: Burnell Blanks, MD;  Location: [place] [facility];  Service: Cardiovascular;  Laterality: N/A;  . GROIN DEBRIDEMENT Right 01/07/2018   Procedure: INCISION AND DEBRIDEMENT OF right GROIN WOUND;  Surgeon: Angelia Mould, MD;  Location: [place];  Service: Vascular;  Laterality: Right;  . INTRAVASCULAR PRESSURE WIRE/FFR STUDY N/A 04/11/2017   Procedure: INTRAVASCULAR PRESSURE WIRE/FFR STUDY;  Surgeon: Nelva Bush, MD;  Location: [place] [facility];  Service: Cardiovascular;  Laterality: N/A;  . LEFT HEART CATH AND CORS/GRAFTS ANGIOGRAPHY N/A 04/11/2017   Procedure: LEFT HEART CATH AND CORS/GRAFTS ANGIOGRAPHY;  Surgeon: Nelva Bush, MD;  Location: [place] [facility];  Service: Cardiovascular;  Laterality: N/A;  . LEFT HEART CATH AND CORS/GRAFTS ANGIOGRAPHY N/A 11/06/2017   Procedure: LEFT HEART CATH AND CORS/GRAFTS ANGIOGRAPHY;  Surgeon: Burnell Blanks, MD;  Location: [place] [facility];  Service: Cardiovascular;  Laterality: N/A;  . LEFT HEART CATHETERIZATION WITH CORONARY ANGIOGRAM N/A 01/18/2014   Procedure: LEFT HEART CATHETERIZATION WITH CORONARY ANGIOGRAM;  Surgeon: Lorretta Harp, MD;  Location: [facility];  Service: Cardiovascular;  Laterality: N/A;  . LOWER EXTREMITY ANGIOGRAPHY Bilateral 12/31/2017   Procedure: Lower Extremity Angiography;  Surgeon: Serafina Mitchell, MD;  Location: [place] [facility];  Service: Cardiovascular;  Laterality: Bilateral;  . PERIPHERAL VASCULAR INTERVENTION Left 12/31/2017   Procedure: PERIPHERAL VASCULAR INTERVENTION;  Surgeon: Serafina Mitchell, MD;  Location: [place] [facility];  Service: Cardiovascular;  Laterality: Left;  ext iliac  . TEE WITHOUT CARDIOVERSION N/A 03/19/2016   Procedure: TRANSESOPHAGEAL ECHOCARDIOGRAM (TEE);  Surgeon: Grace Isaac, MD;  Location: [place];  Service: Open Heart Surgery;  Laterality: N/A;   Family History:  Family History  Problem Relation Age of Onset  . Cancer Mother        Lung cancer  . Coronary artery disease Mother   . Heart  failure Maternal Grandmother    Family Psychiatric  History:  Social History:  Social History   Substance and Sexual Activity  Alcohol Use Not Currently  . Alcohol/week: 1.0 standard drinks  . Types: 1 Cans of beer per week   Comment: occ     Social History   Substance and Sexual Activity  Drug Use Not Currently  . Types: Marijuana   Comment: denies 06/17/2015    Social History   Socioeconomic History  . Marital status: Divorced    Spouse name: Not on file  . Number of children: 2  . Years of education: Not on file  . Highest education level: Not on file  Occupational History  . Occupation: Paediatric nurse  Social Needs  . Financial resource strain: Not on file  . Food insecurity:    Worry: Not on file    Inability: Not on file  . Transportation needs:    Medical: Not on file    Non-medical: Not on file  Tobacco Use  . Smoking status: Current Every Day Smoker    Packs/day: 1.00    Years: 31.00    Pack years: 31.00    Types: Cigarettes  . Smokeless tobacco: Never Used  Substance and Sexual Activity  . Alcohol use: Not Currently    Alcohol/week: 1.0 standard drinks    Types: 1 Cans of beer per week    Comment: occ  . Drug use: Not Currently    Types: Marijuana    Comment: denies 06/17/2015  . Sexual activity: Not Currently  Lifestyle  .  Physical activity:    Days per week: Not on file    Minutes per session: Not on file  . Stress: Not on file  Relationships  . Social connections:    Talks on phone: Not on file    Gets together: Not on file    Attends religious service: Not on file    Active member of club or organization: Not on file    Attends meetings of clubs or organizations: Not on file    Relationship status: Not on file  Other Topics Concern  . Not on file  Social History Narrative   Divorced.  2 sons.  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   Additional Social History:    Pain Medications: See MAR Prescriptions: See  MAR Over the Counter: See MAR History of alcohol / drug use?: Yes Negative Consequences of Use: Financial, Legal, Personal relationships, Work / School Withdrawal Symptoms: Cramps, Irritability, Diarrhea Name of Substance 1: Alcohol.  1 - Age of First Use: UTA 1 - Amount (size/oz): Per chart, pt drank three, 40oz beers today. Pt's BAL was 163 at 2116. 1 - Frequency: Daily. 1 - Duration: Ongoing. 1 - Last Use / Amount: Today (05/02/2018) Name of Substance 2: Cigarettes. 2 - Age of First Use: UTA 2 - Amount (size/oz): Pt reported, smoking a pack and a half of cigarettes daily.  2 - Frequency: Daily.  2 - Duration: Ongoing.  2 - Last Use / Amount: Daily.   Sleep: Fair  Appetite:  Fair  Current Medications: Current Facility-Administered Medications  Medication Dose Route Frequency Provider Last Rate Last Dose  . acetaminophen (TYLENOL) tablet 650 mg  650 mg Oral Q6H PRN Connye Burkitt, NP   650 mg at 05/04/18 1018  . alum & mag hydroxide-simeth (MAALOX/MYLANTA) 200-200-20 MG/5ML suspension 30 mL  30 mL Oral Q4H PRN Patrecia Pour, NP      . carvedilol (COREG) tablet 12.5 mg  12.5 mg Oral BID WC Patrecia Pour, NP   12.5 mg at 05/05/18 0804  . clopidogrel (PLAVIX) tablet 75 mg  75 mg Oral Daily Patrecia Pour, NP   75 mg at 05/05/18 3825  . FLUoxetine (PROZAC) capsule 10 mg  10 mg Oral Daily Patrecia Pour, NP   10 mg at 05/05/18 0804  . hydrOXYzine (ATARAX/VISTARIL) tablet 25 mg  25 mg Oral Q6H PRN Derrill Center, NP   25 mg at 05/03/18 2205  . loperamide (IMODIUM) capsule 2-4 mg  2-4 mg Oral PRN Derrill Center, NP      . LORazepam (ATIVAN) tablet 1 mg  1 mg Oral Q6H PRN Derrill Center, NP   1 mg at 05/04/18 0053  . LORazepam (ATIVAN) tablet 1 mg  1 mg Oral TID Derrill Center, NP   1 mg at 05/05/18 1215   Followed by  . [START ON 05/06/2018] LORazepam (ATIVAN) tablet 1 mg  1 mg Oral BID Derrill Center, NP       Followed by  . [START ON 05/07/2018] LORazepam (ATIVAN) tablet 1  mg  1 mg Oral Daily Lewis, Tanika N, NP      . magnesium hydroxide (MILK OF MAGNESIA) suspension 30 mL  30 mL Oral Daily PRN Patrecia Pour, NP      . multivitamin with minerals tablet 1 tablet  1 tablet Oral Daily Derrill Center, NP   1 tablet at 05/05/18 0805  . nicotine (NICODERM  CQ - dosed in mg/24 hours) patch 21 mg  21 mg Transdermal Daily Irie Dowson, Myer Peer, MD   21 mg at 05/05/18 0806  . ondansetron (ZOFRAN-ODT) disintegrating tablet 4 mg  4 mg Oral Q6H PRN Derrill Center, NP      . thiamine (B-1) injection 100 mg  100 mg Intramuscular Once Derrill Center, NP      . thiamine (VITAMIN B-1) tablet 100 mg  100 mg Oral Daily Derrill Center, NP   100 mg at 05/05/18 0805  . traZODone (DESYREL) tablet 50 mg  50 mg Oral QHS PRN Connye Burkitt, NP   50 mg at 05/04/18 2123    Lab Results:  Results for orders placed or performed during the hospital encounter of 05/03/18 (from the past 48 hour(s))  Lipase, blood     Status: None   Collection Time: 05/05/18  6:45 AM  Result Value Ref Range   Lipase 46 11 - 51 U/L    Comment: Performed at [facility], [place] [address], [place], [place] [address]  Amylase     Status: None   Collection Time: 05/05/18  6:45 AM  Result Value Ref Range   Amylase 82 28 - 100 U/L    Comment: Performed at [facility], [place] [address] [place], [place] [address]  Hemoglobin A1c     Status: None   Collection Time: 05/05/18  6:45 AM  Result Value Ref Range   Hgb A1c MFr Bld 5.5 4.8 - 5.6 %    Comment: (NOTE) Pre diabetes:          5.7%-6.4% Diabetes:              >6.4% Glycemic control for   <7.0% adults with diabetes    Mean Plasma Glucose 111.15 mg/dL    Comment: Performed at [place] [address], [place], [place] [address]  Lipid panel     Status: None   Collection Time: 05/05/18  6:45 AM  Result Value Ref Range   Cholesterol 127 0 - 200 mg/dL   Triglycerides 66 <150 mg/dL   HDL 45 >40 mg/dL   Total  CHOL/HDL Ratio 2.8 RATIO   VLDL 13 0 - 40 mg/dL   LDL Cholesterol 69 0 - 99 mg/dL    Comment:        Total Cholesterol/HDL:CHD Risk Coronary Heart Disease Risk Table                     Men   Women  1/2 Average Risk   3.4   3.3  Average Risk       5.0   4.4  2 X Average Risk   9.6   7.1  3 X Average Risk  23.4   11.0        Use the calculated Patient Ratio above and the CHD Risk Table to determine the patient's CHD Risk.        ATP III CLASSIFICATION (LDL):  <100     mg/dL   Optimal  100-129  mg/dL   Near or Above                    Optimal  130-159  mg/dL   Borderline  160-189  mg/dL   High  >190     mg/dL   Very High Performed at [facility] [address] [place], [place] [address]   CBC with  Differential/Platelet     Status: Abnormal   Collection Time: 05/05/18  6:45 AM  Result Value Ref Range   WBC 8.1 4.0 - 10.5 K/uL   RBC 4.30 4.22 - 5.81 MIL/uL   Hemoglobin 14.6 13.0 - 17.0 g/dL   HCT 44.2 39.0 - 52.0 %   MCV 102.8 (H) 80.0 - 100.0 fL   MCH 34.0 26.0 - 34.0 pg   MCHC 33.0 30.0 - 36.0 g/dL   RDW 13.1 11.5 - 15.5 %   Platelets 219 150 - 400 K/uL   nRBC 0.0 0.0 - 0.2 %   Neutrophils Relative % 69 %   Neutro Abs 5.6 1.7 - 7.7 K/uL   Lymphocytes Relative 20 %   Lymphs Abs 1.6 0.7 - 4.0 K/uL   Monocytes Relative 7 %   Monocytes Absolute 0.6 0.1 - 1.0 K/uL   Eosinophils Relative 2 %   Eosinophils Absolute 0.2 0.0 - 0.5 K/uL   Basophils Relative 1 %   Basophils Absolute 0.1 0.0 - 0.1 K/uL   Immature Granulocytes 1 %   Abs Immature Granulocytes 0.04 0.00 - 0.07 K/uL    Comment: Performed at [facility], [place] [address] [place], [place] [address]  Basic metabolic panel     Status: Abnormal   Collection Time: 05/05/18  6:45 AM  Result Value Ref Range   Sodium 137 135 - 145 mmol/L   Potassium 3.4 (L) 3.5 - 5.1 mmol/L   Chloride 106 98 - 111 mmol/L   CO2 24 22 - 32 mmol/L   Glucose, Bld 160 (H) 70 - 99 mg/dL   BUN <5 (L)  6 - 20 mg/dL   Creatinine, Ser 0.93 0.61 - 1.24 mg/dL   Calcium 8.1 (L) 8.9 - 10.3 mg/dL   GFR calc non Af Amer >60 >60 mL/min   GFR calc Af Amer >60 >60 mL/min   Anion gap 7 5 - 15    Comment: Performed at [facility], [place] [address] [place], [place] [address]  TSH     Status: Abnormal   Collection Time: 05/05/18  6:45 AM  Result Value Ref Range   TSH 5.171 (H) 0.350 - 4.500 uIU/mL    Comment: Performed by a 3rd Generation assay with a functional sensitivity of <=0.01 uIU/mL. Performed at [facility], [place] [address], [place], [place] [address]     Blood Alcohol level:  Lab Results  Component Value Date   ETH 163 (H) 05/02/2018   ETH <10 09/47/0962    Metabolic Disorder Labs: Lab Results  Component Value Date   HGBA1C 5.5 05/05/2018   MPG 111.15 05/05/2018   MPG 122.63 08/21/2017   No results found for: PROLACTIN Lab Results  Component Value Date   CHOL 127 05/05/2018   TRIG 66 05/05/2018   HDL 45 05/05/2018   CHOLHDL 2.8 05/05/2018   VLDL 13 05/05/2018   LDLCALC 69 05/05/2018   LDLCALC 89 03/15/2016    Physical Findings: AIMS: Facial and Oral Movements Muscles of Facial Expression: None, normal Lips and Perioral Area: None, normal Jaw: None, normal Tongue: None, normal,Extremity Movements Upper (arms, wrists, hands, fingers): None, normal Lower (legs, knees, ankles, toes): None, normal, Trunk Movements Neck, shoulders, hips: None, normal, Overall Severity Severity of abnormal movements (highest score from questions above): None, normal Incapacitation due to abnormal movements: None, normal Patient's awareness of abnormal movements (rate only patient's report): No Awareness, Dental Status Current problems with teeth and/or dentures?: No Does patient  usually wear dentures?: No  CIWA:  CIWA-Ar Total: 2 COWS:  COWS Total Score: 3  Musculoskeletal: Strength & Muscle Tone: within normal limits Gait & Station:  normal Patient leans: N/A  Psychiatric Specialty Exam: Physical Exam  ROS no chest pain, no shortness of breath,(+) nausea, and abdominal discomfort, reports loose stools, no melenas .   Blood pressure (!) 149/98, pulse 100, temperature 99.5 F (37.5 C), temperature source Oral, resp. rate 18, height '5\' 11"'  (1.803 m), weight 68 kg, SpO2 100 %.Body mass index is 20.92 kg/m.  General Appearance: Fairly Groomed  Eye Contact:  Good  Speech:  Normal Rate  Volume:  Normal  Mood:  reports some depression, states he is feeling " a little better " today  Affect:  constricted, but smiles at times appropriately   Thought Process:  Linear and Descriptions of Associations: Intact  Orientation:  Other:  fully alert and attentive  Thought Content:  no hallucinations , no delusions, not internally preoccupied   Suicidal Thoughts:  No denies suicidal or self injurious ideations at this time, denies homicidal or violent ideations  Homicidal Thoughts:  No  Memory:  recent and remote grossly intact   Judgement:  Fair  Insight:  Fair  Psychomotor Activity:  Normal  Concentration:  Concentration: Good and Attention Span: Good  Recall:  Good  Fund of Knowledge:  Good  Language:  Good  Akathisia:  Negative  Handed:  Right  AIMS (if indicated):     Assets:  Desire for Improvement Resilience  ADL's:  Intact  Cognition:  WNL  Sleep:  Number of Hours: 5.25   Assessment -  53 year old male, presented to hospital with worsening depression, suicidal thoughts of walking into traffic, neurovegetative symptoms.  Reports he had relapsed on alcohol and has been drinking up to 120 ounces of beer per day.  Admission BAL 163.  Reports death of his father was a significant contributor to worsening depression and increased alcohol consumption.  He has a history of chronic pancreatitis secondary to alcohol use disorder and a history of CAD.  Patient reports partially improved mood , denies suicidal ideations. Some  alcohol WDL symptoms- mild distal tremors. Patient reports history of pancreatitis, and today reports slightly improved but persistent abdominal discomfort . Amilase and Lipase within normal limits.  Able to ambulate on unit without difficulty.  Denies medication side effects.   Treatment Plan Summary: Daily contact with patient to assess and evaluate symptoms and progress in treatment, Medication management, Plan inpatient treatment  and medications as below Encourage group and milieu participation to work on coping skills and symptom reduction Encourage efforts to work on sobriety and relapse prevention Continue Ativan Detox Protocol to address alcohol WDL symptoms Continue Prozac 10 mgrs QDAY for depression Continue Trazodone 50 mgrs QHS PRN for insomnia Pepcid 20 mgrs QDAY for GERD symptoms Continue Zofran PRN for nausea or vomiting  Check  Lipase, Amylase in AM. Recheck EKG to monitor QTc  Treatment team working on disposition planning  Will offer liquid diet .   Myer Peer Brody Kump, MD 05/05/2018, 5:14 PM   2/24 EKG - NSR , HR 92, Normal EKG. QTc 450.  Gabriel Earing MD

## 2018-05-06 ENCOUNTER — Ambulatory Visit (HOSPITAL_COMMUNITY)
Admission: RE | Admit: 2018-05-06 | Discharge: 2018-05-06 | Disposition: A | Discharge status: Home / Self Care | Payer: Self-pay | Source: Ambulatory Visit | Attending: Psychiatry | Admitting: Psychiatry

## 2018-05-06 ENCOUNTER — Encounter (HOSPITAL_COMMUNITY): Payer: Self-pay

## 2018-05-06 DIAGNOSIS — K859 Acute pancreatitis without necrosis or infection, unspecified: Secondary | ICD-10-CM | POA: Insufficient documentation

## 2018-05-06 LAB — AMYLASE: Amylase: 86 U/L (ref 28–100)

## 2018-05-06 LAB — LIPASE, BLOOD: Lipase: 45 U/L (ref 11–51)

## 2018-05-06 MED ORDER — SODIUM CHLORIDE (PF) 0.9 % IJ SOLN
INTRAMUSCULAR | Status: AC
  Filled 2018-05-06: qty 50

## 2018-05-06 MED ORDER — TRAZODONE HCL 100 MG PO TABS
100.0000 mg | ORAL_TABLET | Freq: Every evening | ORAL | Status: DC | PRN
  Administered 2018-05-06 – 2018-05-08 (×3): 100 mg via ORAL
  Filled 2018-05-06: qty 28
  Filled 2018-05-06 (×3): qty 1

## 2018-05-06 MED ORDER — FOLIC ACID 1 MG PO TABS
1.0000 mg | ORAL_TABLET | Freq: Every day | ORAL | Status: DC
  Administered 2018-05-06 – 2018-05-09 (×4): 1 mg via ORAL
  Filled 2018-05-06 (×6): qty 1

## 2018-05-06 MED ORDER — IOHEXOL 300 MG/ML  SOLN
100.0000 mL | Freq: Once | INTRAMUSCULAR | Status: AC | PRN
  Administered 2018-05-06: 100 mL via INTRAVENOUS

## 2018-05-06 NOTE — Progress Notes (Signed)
Patient completed one bottle of white barium. Patient has begun to drink second bottle of white barium. Patient tolerating well.  Sitting in dayroom watching tv.

## 2018-05-06 NOTE — Progress Notes (Signed)
D:  Patient's self inventory sheet, patient has poor sleep, sleep medication not helpful.  Fair appetite, low energy level, poor concentration.  Rated depression 8, hopeless 6, anxiety 7.  Withdrawals, tremors, diarrhea, nausea, irritability.  SI, contracts for safety, not SI while at [facility].  Physical problems, pain, pancreas, worst pain #7.  Goal is rehab.  Plans to meet with MD.  No discharge plans. A:  Medications administered per MD orders.  Emotional support and encouragement given patient. R:  Denied HI.  Denied A/V hallucinations.  Two hours sleep, trazadone not helpful.  SI to jump off bridge if discharged from [facility].

## 2018-05-06 NOTE — Progress Notes (Signed)
Recreation Therapy Notes  Animal-Assisted Activity (AAA) Program Checklist/Progress Notes Patient Eligibility Criteria Checklist & Daily Group note for Rec Tx Intervention  Date: 2.25.20 Time: 1430 Location: [address]   AAA/T Program Assumption of Risk Form signed by Engineer, production or Parent Legal Guardian  YES   Patient is free of allergies or sever asthma  YES   Patient reports no fear of animals  YES   Patient reports no history of cruelty to animals  YES   Patient understands his/her participation is voluntary  YES   Patient washes hands before animal contact  YES   Patient washes hands after animal contact  YES   Behavioral Response:  Engaged  Education: Charity fundraiser, Appropriate Animal Interaction   Education Outcome: Acknowledges understanding/In group clarification offered/Needs additional education.   Clinical Observations/Feedback:  Pt attended and participated in activity.    Caroll Rancher, LRT/CTRS         Caroll Rancher A 05/06/2018 3:09 PM

## 2018-05-06 NOTE — Progress Notes (Signed)
D:  Douglas [name] was up and visible on the unit.  He attended evening AA group.  He denied SI/HI or A/V hallucinations this evening.  He report his main complaint today was not being able to sleep and some withdrawal symptoms.  He denied any pain and appeared to be in no physical distress.  He took hs medications without difficulty but was still unable to fall asleep.  Barbara Cower NP notified and repeat dosage of trazodone ordered and given. He is currently reading a book quietly in his room.   A:  1:1 with RN for support and encouragement.  Medications as ordered.  Q 15 minute checks maintained for safety.  Encouraged participation in group and unit activities.   R:  Douglas [name] remains safe on the unit.  We will continue to monitor the progress towards his goals.

## 2018-05-06 NOTE — Progress Notes (Addendum)
Counselor Kerry Hough, MS, [facility], [place] met with patient Douglas [name]. Pt reported not sleeping well last night due to fears of discharge. Pt reported he came to [facility] to get help and is hoping to go to a residential treatment facility following time at [facility]. Pt reports he is afraid of being discharged without a place to go, and he reports if this happens he will become increasingly suicidal. Pt reported if discharged to the streets, he will "drink a lot and [place] [address] [place]." Counselor relayed this information to RN Grayland Ormond at 9:35 am. RN made a note of pt's SI.  Pt reported having success in living in [place] in the past and that now he feels like a rehab facility will be best for him. Pt reports continued pancreas pain. Pt described his sober self as being fun to be around and happy and shared that he wants to go back to being that person. Pt reported high motivation for change and commitment to treatment.  Counselor provided pt with professional disclosure statement and consent to audio record. Counselor reviewed both with patient and he reported having no questions. Pt signed both forms.  Kerry Hough, MS, [facility], [place]

## 2018-05-06 NOTE — [facility] Group Notes (Signed)
[facility] Group Notes: Nursing Psychoeducation  Date:  05/06/2018  Time:  4:00 PM  Type of Therapy:  Psychoeducational Skills  Participation Level:  Did Not Attend  Summary of Progress/Problems: Patient was invited but declined to attend group.  Ryan Palermo A Kobyn Kray 05/06/2018, 5:00 PM 

## 2018-05-06 NOTE — Progress Notes (Signed)
Pt attend wrap up group. 

## 2018-05-06 NOTE — Progress Notes (Signed)
D: Pt was in dayroom upon initial approach.  Pt presents with anxious, depressed affect and mood.  He reports his day was "tiring.  I had test after test."  Pt reports goal is to "find out what's up with my pancreas."  He was encouraged to discuss test results with provider tomorrow and he verbalize understanding.  He reports he wants "to see if I can possibly stay here 'til my assessment at [facility] on March 3rd."  He states "if they ask me to leave, I'll probably go out and get drunk and I don't know what's going to happen after that."  Pt would like to go straight to another facility from [facility].  Pt denies SI/HI, denies hallucinations, reports abomdinal pain of 8/10.  Pt has been visible in milieu interacting with peers and staff appropriately.  Pt attended evening group.    A: Introduced self to pt.  Met with pt 1:1.  Actively listened to pt and offered support and encouragement.   PRN medication administered for pain and sleep. Q15 minute safety checks maintained.  R: Pt is safe on the unit.  Pt is compliant with medications.  Pt verbally contracts for safety.  Will continue to monitor and assess.

## 2018-05-06 NOTE — Plan of Care (Signed)
Nurse discussed anxiety, depression and coping skills with patient.  

## 2018-05-06 NOTE — Progress Notes (Signed)
Patient completed his second bottle of white barium. Patient left with [facility] to go to [facility] for test.

## 2018-05-06 NOTE — Progress Notes (Signed)
Nurse talked to [facility], Radiology, [phone].  Two bottles of white barium sent to [facility]. One bottle is to be drank at 11:30 a.m. Second bottle is to be drank at 12:30 p.m. Patient is to be at [facility] at 1:15 p.m. for test. Charge nurse informed.

## 2018-05-06 NOTE — Progress Notes (Signed)
[facility] Group Therapy 05/06/2018 3:34 PM  Type of Therapy: Mental Health Association Presentation  Participation Level: Active  Participation Quality: Attentive  Affect: Appropriate  Cognitive: Oriented  Insight: Developing/Improving  Engagement in Therapy: Engaged  Modes of Intervention: Discussion, Education and Socialization   Summary of Progress/Problems: Mental Health Association (MHA) Speaker came to talk about his personal journey with mental health. The pt processed ways by which to relate to the speaker. MHA speaker provided handouts and educational information pertaining to groups and services offered by the MHA. Pt was engaged in speaker's presentation and was receptive to resources provided.   Sunday Klos, MSW, LCSWA 05/06/2018 3:34 PM 

## 2018-05-06 NOTE — Progress Notes (Signed)
CSW spoke with patient at nurse's station. CSW updated patient that his [facility] screening is March 2nd, provided patient with a printed list of [address] in [place].  Enid Cutter, LCSW-A Clinical Social Worker

## 2018-05-06 NOTE — Plan of Care (Signed)
  Problem: Activity: Goal: Interest or engagement in leisure activities will improve Outcome: Progressing   Problem: Activity: Goal: Imbalance in normal sleep/wake cycle will improve Outcome: Not Progressing

## 2018-05-06 NOTE — Progress Notes (Signed)
After patient returned from [facility], patient took nap.  Stated he felt all right.  Ate snack, took medications scheduled at 1700, and went to dining room for dinner. Respirations even and unlabored.  No signs/symptoms of pain/distress noted on patient's face/body movements.  Safety maintained with 15 minute checks.

## 2018-05-06 NOTE — Progress Notes (Signed)
[facility] MD Progress Note  05/06/2018 10:07 AM Douglas [name]  MRN:  [id number] Subjective:   Douglas [name] is a 53 year old male with history of alcohol use disorder, depression, chronic pancreatitis, HTN, HLD, peripheral vascular disease, CAD with stents and CABG in 2018, presenting voluntarily for treatment of alcohol dependence and suicidal ideation. He reports relapsing on alcohol in December 2019 after 18 months sobriety.   Objective: Patient is seen and examined.  Patient is a 53 year old male with the above-stated past psychiatric history seen in follow-up.  His main complaint today is abdominal pain.  He stated he is having problems with his chronic pancreatitis.  His last pancreatic enzymes were checked on our unit, and were basically in the normal range.  Review of his old records showed that his last CT scan of the abdomen was in 2018 and showed a grooved pancreas.  He stated his alcohol withdrawal symptoms are stable.  He stated he was drinking between 6 to 8 40 ounce beers a day with blackouts.  He was brought to the emergency department by police.  He was placed on an Ativan CIWA protocol.  He endorsed problems with nausea and diarrhea, but denied any vomiting on admission.  His blood alcohol on admission was 163.  He is emphatic today that he needs a rehabilitation program.  His pulse is 105 today, his blood pressure is elevated at 150/105.  He only slept 2 hours last night.  His CIWA score this morning was 1.  Principal Problem: Alcohol use disorder, severe, dependence (HCC) Diagnosis: Principal Problem:   Alcohol use disorder, severe, dependence (HCC) Active Problems:   Major depressive disorder, recurrent severe without psychotic features (HCC)  Total Time spent with patient: 20 minutes  Past Psychiatric History: See admission H&P  Past Medical History:  Past Medical History:  Diagnosis Date  . Anginal pain (HCC)   . Coronary artery disease   . GERD (gastroesophageal reflux  disease)   . History of acute pancreatitis 04/06/2015  . NSTEMI (non-ST elevated myocardial infarction) (HCC) 01/2014  . Pancreatitis   . PTSD (post-traumatic stress disorder)   . Tobacco abuse     Past Surgical History:  Procedure Laterality Date  . ABDOMINAL AORTOGRAM N/A 12/31/2017   Procedure: ABDOMINAL AORTOGRAM;  Surgeon: Nada Libman, MD;  [facility] IN[facility];  Service: Cardiovascular;  Laterality: N/A;  . ABDOMINAL AORTOGRAM W/LOWER EXTREMITY N/A 11/06/2017   Procedure: ABDOMINAL AORTOGRAM W/LOWER EXTREMITY;  Surgeon: Kathleene Hazel, MD;  [facility]: [facility] IN[facility] CV LAB;  Service: Cardiovascular;  Laterality: N/A;  . CARDIAC CATHETERIZATION N/A 03/14/2016   Procedure: Left Heart Cath and Coronary Angiography;  Surgeon: Kathleene Hazel, MD;  Location: [facility] INVASIVE CV LAB;  Service: Cardiovascular;  Laterality: N/A;  . CORONARY ANGIOPLASTY WITH STENT PLACEMENT  01/18/2014   "1"  . CORONARY ARTERY BYPASS GRAFT N/A 03/19/2016   Procedure: CORONARY ARTERY BYPASS GRAFTING (CABG), ON PUMP, TIMES FIVE, USING LEFT INTERNAL MAMMARY ARTERY AND RIGHT GREATER SAPHENOUS VEIN HARVESTED ENDOSCOPICALLY;  Surgeon: Delight Ovens, MD;  Location: [facility] OR;  Service: Open Heart Surgery;  Laterality: N/A;  LIMA-LAD SVG-OM SVG-DIAG SEQ SVG-PD-PL  . CORONARY BALLOON ANGIOPLASTY N/A 04/11/2017   Procedure: CORONARY BALLOON ANGIOPLASTY;  Surgeon: Yvonne Kendall, MD;  [facility];  Service: Cardiovascular;  Laterality: N/A;  Distal RCA  . CORONARY STENT INTERVENTION N/A 11/06/2017   Procedure: CORONARY STENT INTERVENTION;  Surgeon: Kathleene Hazel, MD;  [facility];  Service:  Cardiovascular;  Laterality: N/A;  . GROIN DEBRIDEMENT Right 01/07/2018   Procedure: INCISION AND DEBRIDEMENT OF right GROIN WOUND;  Surgeon: Chuck Hint, MD;  Location: [facility] OR;  Service: Vascular;  Laterality: Right;  . INTRAVASCULAR PRESSURE WIRE/FFR STUDY N/A  04/11/2017   Procedure: INTRAVASCULAR PRESSURE WIRE/FFR STUDY;  Surgeon: Yvonne Kendall, MD;  [facility];  Service: Cardiovascular;  Laterality: N/A;  . LEFT HEART CATH AND CORS/GRAFTS ANGIOGRAPHY N/A 04/11/2017   Procedure: LEFT HEART CATH AND CORS/GRAFTS ANGIOGRAPHY;  Surgeon: Yvonne Kendall, MD;  [facility];  Service: Cardiovascular;  Laterality: N/A;  . LEFT HEART CATH AND CORS/GRAFTS ANGIOGRAPHY N/A 11/06/2017   Procedure: LEFT HEART CATH AND CORS/GRAFTS ANGIOGRAPHY;  Surgeon: Kathleene Hazel, MD;  [facility];  Service: Cardiovascular;  Laterality: N/A;  . LEFT HEART CATHETERIZATION WITH CORONARY ANGIOGRAM N/A 01/18/2014   Procedure: LEFT HEART CATHETERIZATION WITH CORONARY ANGIOGRAM;  Surgeon: Runell Gess, MD;  Location: [facility] CATH LAB;  Service: Cardiovascular;  Laterality: N/A;  . LOWER EXTREMITY ANGIOGRAPHY Bilateral 12/31/2017   Procedure: Lower Extremity Angiography;  Surgeon: Nada Libman, MD;  [facility];  Service: Cardiovascular;  Laterality: Bilateral;  . PERIPHERAL VASCULAR INTERVENTION Left 12/31/2017   Procedure: PERIPHERAL VASCULAR INTERVENTION;  Surgeon: Nada Libman, MD;  [facility];  Service: Cardiovascular;  Laterality: Left;  ext iliac  . TEE WITHOUT CARDIOVERSION N/A 03/19/2016   Procedure: TRANSESOPHAGEAL ECHOCARDIOGRAM (TEE);  Surgeon: Delight Ovens, MD;  Location: [facility] OR;  Service: Open Heart Surgery;  Laterality: N/A;   Family History:  Family History  Problem Relation Age of Onset  . Cancer Mother        Lung cancer  . Coronary artery disease Mother   . Heart failure Maternal Grandmother    Family Psychiatric  History: See admission H&P Social History:  Social History   Substance and Sexual Activity  Alcohol Use Not Currently  . Alcohol/week: 1.0 standard drinks  . Types: 1 Cans of beer per week   Comment: occ     Social History   Substance and  Sexual Activity  Drug Use Not Currently  . Types: Marijuana   Comment: denies 06/17/2015    Social History   Socioeconomic History  . Marital status: Divorced    Spouse name: Not on file  . Number of children: 2  . Years of education: Not on file  . Highest education level: Not on file  Occupational History  . Occupation: Statistician  Social Needs  . Financial resource strain: Not on file  . Food insecurity:    Worry: Not on file    Inability: Not on file  . Transportation needs:    Medical: Not on file    Non-medical: Not on file  Tobacco Use  . Smoking status: Current Every Day Smoker    Packs/day: 1.00    Years: 31.00    Pack years: 31.00    Types: Cigarettes  . Smokeless tobacco: Never Used  Substance and Sexual Activity  . Alcohol use: Not Currently    Alcohol/week: 1.0 standard drinks    Types: 1 Cans of beer per week    Comment: occ  . Drug use: Not Currently    Types: Marijuana    Comment: denies 06/17/2015  . Sexual activity: Not Currently  Lifestyle  . Physical activity:    Days per week: Not on file    Minutes per session: Not on  file  . Stress: Not on file  Relationships  . Social connections:    Talks on phone: Not on file    Gets together: Not on file    Attends religious service: Not on file    Active member of club or organization: Not on file    Attends meetings of clubs or organizations: Not on file    Relationship status: Not on file  Other Topics Concern  . Not on file  Social History Narrative   Divorced.  2 sons.  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   Additional Social History:    Pain Medications: See MAR Prescriptions: See MAR Over the Counter: See MAR History of alcohol / drug use?: Yes Negative Consequences of Use: Financial, Legal, Personal relationships, Work / School Withdrawal Symptoms: Cramps, Irritability, Diarrhea Name of Substance 1: Alcohol.  1 - Age of First Use: UTA 1 - Amount (size/oz):  Per chart, pt drank three, 40oz beers today. Pt's BAL was 163 at 2116. 1 - Frequency: Daily. 1 - Duration: Ongoing. 1 - Last Use / Amount: Today (05/02/2018) Name of Substance 2: Cigarettes. 2 - Age of First Use: UTA 2 - Amount (size/oz): Pt reported, smoking a pack and a half of cigarettes daily.  2 - Frequency: Daily.  2 - Duration: Ongoing.  2 - Last Use / Amount: Daily.                 Sleep: Poor  Appetite:  Fair  Current Medications: Current Facility-Administered Medications  Medication Dose Route Frequency Provider Last Rate Last Dose  . acetaminophen (TYLENOL) tablet 650 mg  650 mg Oral Q6H PRN Aldean Baker, NP   650 mg at 05/04/18 1018  . alum & mag hydroxide-simeth (MAALOX/MYLANTA) 200-200-20 MG/5ML suspension 30 mL  30 mL Oral Q4H PRN Charm Rings, NP      . carvedilol (COREG) tablet 12.5 mg  12.5 mg Oral BID WC Charm Rings, NP   12.5 mg at 05/06/18 1610  . clopidogrel (PLAVIX) tablet 75 mg  75 mg Oral Daily Charm Rings, NP   75 mg at 05/06/18 9604  . famotidine (PEPCID) tablet 20 mg  20 mg Oral Daily Cobos, Rockey Situ, MD   20 mg at 05/06/18 0803  . FLUoxetine (PROZAC) capsule 10 mg  10 mg Oral Daily Charm Rings, NP   10 mg at 05/06/18 0804  . loperamide (IMODIUM) capsule 2-4 mg  2-4 mg Oral PRN Oneta Rack, NP      . LORazepam (ATIVAN) tablet 1 mg  1 mg Oral Q6H PRN Oneta Rack, NP   1 mg at 05/05/18 2132  . magnesium hydroxide (MILK OF MAGNESIA) suspension 30 mL  30 mL Oral Daily PRN Charm Rings, NP      . multivitamin with minerals tablet 1 tablet  1 tablet Oral Daily Oneta Rack, NP   1 tablet at 05/06/18 0804  . nicotine (NICODERM CQ - dosed in mg/24 hours) patch 21 mg  21 mg Transdermal Daily Cobos, Rockey Situ, MD   21 mg at 05/06/18 0804  . ondansetron (ZOFRAN-ODT) disintegrating tablet 4 mg  4 mg Oral Q6H PRN Oneta Rack, NP      . thiamine (B-1) injection 100 mg  100 mg Intramuscular Once Oneta Rack, NP      .  thiamine (VITAMIN B-1) tablet 100 mg  100 mg Oral Daily Oneta Rack,  NP   100 mg at 05/06/18 0804  . traZODone (DESYREL) tablet 50 mg  50 mg Oral QHS PRN Aldean Baker, NP   50 mg at 05/05/18 2132    Lab Results:  Results for orders placed or performed during the hospital encounter of 05/03/18 (from the past 48 hour(s))  Lipase, blood     Status: None   Collection Time: 05/05/18  6:45 AM  Result Value Ref Range   Lipase 46 11 - 51 U/L    Comment: Performed at [facility], [address], [place], [place] [address]  Amylase     Status: None   Collection Time: 05/05/18  6:45 AM  Result Value Ref Range   Amylase 82 28 - 100 U/L    Comment: Performed at [facility], [address], [place], [place] [address]  Hemoglobin A1c     Status: None   Collection Time: 05/05/18  6:45 AM  Result Value Ref Range   Hgb A1c MFr Bld 5.5 4.8 - 5.6 %    Comment: (NOTE) Pre diabetes:          5.7%-6.4% Diabetes:              >6.4% Glycemic control for   <7.0% adults with diabetes    Mean Plasma Glucose 111.15 mg/dL    Comment: Performed at [facility], [address], [place], [place] [address]  Lipid panel     Status: None   Collection Time: 05/05/18  6:45 AM  Result Value Ref Range   Cholesterol 127 0 - 200 mg/dL   Triglycerides 66 <606 mg/dL   HDL 45 >30 mg/dL   Total CHOL/HDL Ratio 2.8 RATIO   VLDL 13 0 - 40 mg/dL   LDL Cholesterol 69 0 - 99 mg/dL    Comment:        Total Cholesterol/HDL:CHD Risk Coronary Heart Disease Risk Table                     Men   Women  1/2 Average Risk   3.4   3.3  Average Risk       5.0   4.4  2 X Average Risk   9.6   7.1  3 X Average Risk  23.4   11.0        Use the calculated Patient Ratio above and the CHD Risk Table to determine the patient's CHD Risk.        ATP III CLASSIFICATION (LDL):  <100     mg/dL   Optimal  160-109  mg/dL   Near or Above                    Optimal  130-159  mg/dL    Borderline  323-557  mg/dL   High  >322     mg/dL   Very High Performed at [facility], [address] [place], [place] [address]   CBC with Differential/Platelet     Status: Abnormal   Collection Time: 05/05/18  6:45 AM  Result Value Ref Range   WBC 8.1 4.0 - 10.5 K/uL   RBC 4.30 4.22 - 5.81 MIL/uL   Hemoglobin 14.6 13.0 - 17.0 g/dL   HCT 70.6 23.7 - 62.8 %   MCV 102.8 (H) 80.0 - 100.0 fL   MCH 34.0 26.0 - 34.0 pg   MCHC 33.0 30.0 - 36.0 g/dL   RDW 31.5 17.6 -  15.5 %   Platelets 219 150 - 400 K/uL   nRBC 0.0 0.0 - 0.2 %   Neutrophils Relative % 69 %   Neutro Abs 5.6 1.7 - 7.7 K/uL   Lymphocytes Relative 20 %   Lymphs Abs 1.6 0.7 - 4.0 K/uL   Monocytes Relative 7 %   Monocytes Absolute 0.6 0.1 - 1.0 K/uL   Eosinophils Relative 2 %   Eosinophils Absolute 0.2 0.0 - 0.5 K/uL   Basophils Relative 1 %   Basophils Absolute 0.1 0.0 - 0.1 K/uL   Immature Granulocytes 1 %   Abs Immature Granulocytes 0.04 0.00 - 0.07 K/uL    Comment: Performed at [facility], [address], [place], [place] [address]  Basic metabolic panel     Status: Abnormal   Collection Time: 05/05/18  6:45 AM  Result Value Ref Range   Sodium 137 135 - 145 mmol/L   Potassium 3.4 (L) 3.5 - 5.1 mmol/L   Chloride 106 98 - 111 mmol/L   CO2 24 22 - 32 mmol/L   Glucose, Bld 160 (H) 70 - 99 mg/dL   BUN <5 (L) 6 - 20 mg/dL   Creatinine, Ser 6.04 0.61 - 1.24 mg/dL   Calcium 8.1 (L) 8.9 - 10.3 mg/dL   GFR calc non Af Amer >60 >60 mL/min   GFR calc Af Amer >60 >60 mL/min   Anion gap 7 5 - 15    Comment: Performed at [facility], [address] [place], [place] [address]  TSH     Status: Abnormal   Collection Time: 05/05/18  6:45 AM  Result Value Ref Range   TSH 5.171 (H) 0.350 - 4.500 uIU/mL    Comment: Performed by a 3rd Generation assay with a functional sensitivity of <=0.01 uIU/mL. Performed at [facility], [address],  [place], [place] [address]   Amylase     Status: None   Collection Time: 05/06/18  6:40 AM  Result Value Ref Range   Amylase 86 28 - 100 U/L    Comment: Performed at [facility], [address] [place], [place] [address]  Lipase, blood     Status: None   Collection Time: 05/06/18  6:40 AM  Result Value Ref Range   Lipase 45 11 - 51 U/L    Comment: Performed at [facility], [address] [place], [place] [address]    Blood Alcohol level:  Lab Results  Component Value Date   ETH 163 (H) 05/02/2018   ETH <10 04/10/2017    Metabolic Disorder Labs: Lab Results  Component Value Date   HGBA1C 5.5 05/05/2018   MPG 111.15 05/05/2018   MPG 122.63 08/21/2017   No results found for: PROLACTIN Lab Results  Component Value Date   CHOL 127 05/05/2018   TRIG 66 05/05/2018   HDL 45 05/05/2018   CHOLHDL 2.8 05/05/2018   VLDL 13 05/05/2018   LDLCALC 69 05/05/2018   LDLCALC 89 03/15/2016    Physical Findings: AIMS: Facial and Oral Movements Muscles of Facial Expression: None, normal Lips and Perioral Area: None, normal Jaw: None, normal Tongue: None, normal,Extremity Movements Upper (arms, wrists, hands, fingers): None, normal Lower (legs, knees, ankles, toes): None, normal, Trunk Movements Neck, shoulders, hips: None, normal, Overall Severity Severity of abnormal movements (highest score from questions above): None, normal Incapacitation due to abnormal movements: None, normal Patient's awareness of abnormal movements (rate only patient's report): No Awareness, Dental  Status Current problems with teeth and/or dentures?: No Does patient usually wear dentures?: No  CIWA:  CIWA-Ar Total: 1 COWS:  COWS Total Score: 3  Musculoskeletal: Strength & Muscle Tone: within normal limits Gait & Station: normal Patient leans: N/A  Psychiatric Specialty Exam: Physical Exam  Nursing note and vitals reviewed. Constitutional: He is oriented to person,  place, and time. He appears well-developed and well-nourished.  HENT:  Head: Normocephalic and atraumatic.  Respiratory: Effort normal.  Neurological: He is alert and oriented to person, place, and time.    ROS  Blood pressure (!) 155/103, pulse (!) 105, temperature 98.2 F (36.8 C), temperature source Oral, resp. rate 18, height  (1.803 m), weight 68 kg, SpO2 100 %.Body mass index is 20.92 kg/m.  General Appearance: Casual  Eye Contact:  Fair  Speech:  Normal Rate  Volume:  Normal  Mood:  Anxious  Affect:  Congruent  Thought Process:  Coherent and Descriptions of Associations: Intact  Orientation:  Full (Time, Place, and Person)  Thought Content:  Logical  Suicidal Thoughts:  No  Homicidal Thoughts:  No  Memory:  Immediate;   Fair Recent;   Fair Remote;   Fair  Judgement:  Intact  Insight:  Fair  Psychomotor Activity:  Increased  Concentration:  Concentration: Fair and Attention Span: Fair  Recall:  [employer] of Knowledge:  Fair  Language:  Fair  Akathisia:  Negative  Handed:  Right  AIMS (if indicated):     Assets:  Communication Skills Desire for Improvement Resilience  ADL's:  Intact  Cognition:  WNL  Sleep:  Number of Hours: 2     Treatment Plan Summary: Daily contact with patient to assess and evaluate symptoms and progress in treatment, Medication management and Plan : Patient is seen and examined.  Patient is a 53 year old male with the above-stated past psychiatric history who is seen in follow-up.  He continues to ruminate about his abdominal pain.  His last CT scan of the abdomen was done in 2018.  To better clarify the causes of his abdominal pain we will go on and get a CT scan with contrast today.  That should help [place] delineate whether or not he has any active pancreatitis problems.  If necessary we will add pancreatic enzymes.  His CIWA this morning is 1.  We will continue the Ativan 1 mg every 6 hours PRN CIWA greater than 10.  Given his lack of  sleep, I will increase his trazodone 200 mg p.o. nightly.  He has a significant history of vascular disease, and we will continue his Coreg, Plavix.  We will continue his Prozac at 10 mg p.o. daily for depression.  Review of his laboratories revealed an elevated glucose of 160, elevated alkaline phosphatase at 138, and elevated AST at 57, and an increased MCV at 102.8.  He is already on thiamine, but I will add folic acid 1 mg p.o. daily.  We are awaiting information with regard to placement in a rehabilitation facility or an [facility]. 1.  Continue PRN lorazepam for CIWA greater than 10 for alcohol withdrawal symptoms. 2.  Continue Coreg 12.5 mg p.o. twice daily for hypertension, but we may need to increase this. 3.  Continue Plavix 75 mg p.o. daily for vascular disease. 4.  Continue Pepcid 20 mg p.o. daily for gastric protection. 5.  Continue thiamine 100 mg p.o. daily for nutritional supplementation. 6.  Continue fluoxetine 10 mg p.o. daily for mood and anxiety. 7.  Increase trazodone to 100 mg p.o. nightly as needed insomnia. 8.  CT scan of the abdomen with contrast to delineate cause of abdominal pain including pancreatitis. 9.  Disposition planning-in progress.  Antonieta Pert, MD 05/06/2018, 10:07 AM

## 2018-05-07 MED ORDER — HYDROXYZINE HCL 50 MG PO TABS
50.0000 mg | ORAL_TABLET | Freq: Once | ORAL | Status: AC
  Administered 2018-05-07: 50 mg via ORAL
  Filled 2018-05-07 (×2): qty 1

## 2018-05-07 MED ORDER — BENZOCAINE 10 % MT GEL
Freq: Two times a day (BID) | OROMUCOSAL | Status: DC | PRN
  Administered 2018-05-07 (×2): via OROMUCOSAL
  Filled 2018-05-07: qty 9.4

## 2018-05-07 NOTE — Progress Notes (Addendum)
CSW met with patient on unit to discuss discharge planning and aftercare. Patient aware of expected discharge on Friday, he reports situational and passive SI without a plan regarding anxiety about discharge.   He reports he called some of the [facility] and one was willing to consider accepting him, but not for a short period of time (3 days) if he were to enter [place] encouraged patient to call family supports in the area to ask if he can stay with them through the weekend, patient states he will call his ex-mother in law. CSW asked if patient is familiar with local shelters, patient reports he has stayed at [facility] in the past. CSW asked patient to plan for discharge on Friday.    CSW followed up with [facility] referral. Patient will need to complete a phone screening on 02/27 at 8:45am with Shayla.   Stephanie Acre, LCSW-A Clinical Social Worker

## 2018-05-07 NOTE — Progress Notes (Signed)
Recreation Therapy Notes  Date:  2.26.20 Time: 0930 Location: [address] Dayroom  Group Topic: Stress Management  Goal Area(s) Addresses:  Patient will identify positive stress management techniques. Patient will identify benefits of using stress management post d/c.  Behavioral Response:  Engaged  Intervention:  Stress Management    Activity :  Guided Imagery.  LRT introduced the stress management technique of guided imagery.  LRT read a script that focused on relaxing under the summer clouds.  Patients were to listen in order to engage as script was read.    Education:  Stress Management, Discharge Planning.   Education Outcome: Acknowledges Education  Clinical Observations/Feedback:  Pt attended and participated in group.     Caroll Rancher,  LRT/CTRS         Caroll Rancher A 05/07/2018 11:29 AM

## 2018-05-07 NOTE — Progress Notes (Signed)
[facility] MD Progress Note  05/07/2018 9:16 AM Douglas [name]  MRN:  [id number] Subjective:  "My tooth hurts."  Douglas [name] found socializing in the dayroom. He presents with full range of affect but reports anxious mood related to planning for discharge. He has [facility] screening next week but does not know where he will be staying before then. Also continues to complain of abdominal pain "I think I have pancreatitis. You'll know you need to send me for a medical admission when the CT comes back." CT results came back this morning and show no acute pancreatitis, no acute findings in abdomen. Patient also reports onset of toothache this morning. Reports feeling "shaky" today related to discontinuation of Ativan. No tremor observed. CIWA was 1. Denies SI/HI/AVH.  From admission H&P: Douglas [name] is a 53 year old male with history of alcohol use disorder, depression, chronic pancreatitis, HTN, HLD, peripheral vascular disease, CAD with stents and CABG in 2018, presenting voluntarily for treatment of alcohol dependence and suicidal ideation. He reports relapsing on alcohol in December 2019 after 18 months sobriety.  Principal Problem: Alcohol use disorder, severe, dependence (HCC) Diagnosis: Principal Problem:   Alcohol use disorder, severe, dependence (HCC) Active Problems:   Major depressive disorder, recurrent severe without psychotic features (HCC)  Total Time spent with patient: 15 minutes  Past Psychiatric History: See admission H&P  Past Medical History:  Past Medical History:  Diagnosis Date  . Anginal pain (HCC)   . Coronary artery disease   . GERD (gastroesophageal reflux disease)   . History of acute pancreatitis 04/06/2015  . NSTEMI (non-ST elevated myocardial infarction) (HCC) 01/2014  . Pancreatitis   . PTSD (post-traumatic stress disorder)   . Tobacco abuse     Past Surgical History:  Procedure Laterality Date  . ABDOMINAL AORTOGRAM N/A 12/31/2017   Procedure: ABDOMINAL  AORTOGRAM;  Surgeon: Nada Libman, MD;  Location: [facility] IN[facility];  Service: Cardiovascular;  Laterality: N/A;  . ABDOMINAL AORTOGRAM W/LOWER EXTREMITY N/A 11/06/2017   Procedure: ABDOMINAL AORTOGRAM W/LOWER EXTREMITY;  Surgeon: Kathleene Hazel, MD;  Location: [facility];  Service: Cardiovascular;  Laterality: N/A;  . CARDIAC CATHETERIZATION N/A 03/14/2016   Procedure: Left Heart Cath and Coronary Angiography;  Surgeon: Kathleene Hazel, MD;  Location: [facility] INVASIVE CV LAB;  Service: Cardiovascular;  Laterality: N/A;  . CORONARY ANGIOPLASTY WITH STENT PLACEMENT  01/18/2014   "1"  . CORONARY ARTERY BYPASS GRAFT N/A 03/19/2016   Procedure: CORONARY ARTERY BYPASS GRAFTING (CABG), ON PUMP, TIMES FIVE, USING LEFT INTERNAL MAMMARY ARTERY AND RIGHT GREATER SAPHENOUS VEIN HARVESTED ENDOSCOPICALLY;  Surgeon: Delight Ovens, MD;  Location: [facility] OR;  Service: Open Heart Surgery;  Laterality: N/A;  LIMA-LAD SVG-OM SVG-DIAG SEQ SVG-PD-PL  . CORONARY BALLOON ANGIOPLASTY N/A 04/11/2017   Procedure: CORONARY BALLOON ANGIOPLASTY;  Surgeon: Yvonne Kendall, MD;  Location: [facility];  Service: Cardiovascular;  Laterality: N/A;  Distal RCA  . CORONARY STENT INTERVENTION N/A 11/06/2017   Procedure: CORONARY STENT INTERVENTION;  Surgeon: Kathleene Hazel, MD;  Location: [facility];  Service: Cardiovascular;  Laterality: N/A;  . GROIN DEBRIDEMENT Right 01/07/2018   Procedure: INCISION AND DEBRIDEMENT OF right GROIN WOUND;  Surgeon: Chuck Hint, MD;  Location: [facility] OR;  Service: Vascular;  Laterality: Right;  . INTRAVASCULAR PRESSURE WIRE/FFR STUDY N/A 04/11/2017   Procedure: INTRAVASCULAR PRESSURE WIRE/FFR STUDY;  Surgeon: Yvonne Kendall, MD;  Location: [facility];  Service: Cardiovascular;  Laterality: N/A;  . LEFT HEART CATH  AND CORS/GRAFTS ANGIOGRAPHY N/A 04/11/2017   Procedure: LEFT HEART CATH AND CORS/GRAFTS ANGIOGRAPHY;  Surgeon: Yvonne Kendall, MD;   Location: [facility];  Service: Cardiovascular;  Laterality: N/A;  . LEFT HEART CATH AND CORS/GRAFTS ANGIOGRAPHY N/A 11/06/2017   Procedure: LEFT HEART CATH AND CORS/GRAFTS ANGIOGRAPHY;  Surgeon: Kathleene Hazel, MD;  Location: [facility];  Service: Cardiovascular;  Laterality: N/A;  . LEFT HEART CATHETERIZATION WITH CORONARY ANGIOGRAM N/A 01/18/2014   Procedure: LEFT HEART CATHETERIZATION WITH CORONARY ANGIOGRAM;  Surgeon: Runell Gess, MD;  Location: [facility] CATH LAB;  Service: Cardiovascular;  Laterality: N/A;  . LOWER EXTREMITY ANGIOGRAPHY Bilateral 12/31/2017   Procedure: Lower Extremity Angiography;  Surgeon: Nada Libman, MD;  Location: [facility];  Service: Cardiovascular;  Laterality: Bilateral;  . PERIPHERAL VASCULAR INTERVENTION Left 12/31/2017   Procedure: PERIPHERAL VASCULAR INTERVENTION;  Surgeon: Nada Libman, MD;  Location: [facility];  Service: Cardiovascular;  Laterality: Left;  ext iliac  . TEE WITHOUT CARDIOVERSION N/A 03/19/2016   Procedure: TRANSESOPHAGEAL ECHOCARDIOGRAM (TEE);  Surgeon: Delight Ovens, MD;  Location: [facility] OR;  Service: Open Heart Surgery;  Laterality: N/A;   Family History:  Family History  Problem Relation Age of Onset  . Cancer Mother        Lung cancer  . Coronary artery disease Mother   . Heart failure Maternal Grandmother    Family Psychiatric  History: See admission H&P Social History:  Social History   Substance and Sexual Activity  Alcohol Use Not Currently  . Alcohol/week: 1.0 standard drinks  . Types: 1 Cans of beer per week   Comment: occ     Social History   Substance and Sexual Activity  Drug Use Not Currently  . Types: Marijuana   Comment: denies 06/17/2015    Social History   Socioeconomic History  . Marital status: Divorced    Spouse name: Not on file  . Number of children: 2  . Years of education: Not on file  . Highest education level: Not on file  Occupational History  .  Occupation: Statistician  Social Needs  . Financial resource strain: Not on file  . Food insecurity:    Worry: Not on file    Inability: Not on file  . Transportation needs:    Medical: Not on file    Non-medical: Not on file  Tobacco Use  . Smoking status: Current Every Day Smoker    Packs/day: 1.00    Years: 31.00    Pack years: 31.00    Types: Cigarettes  . Smokeless tobacco: Never Used  Substance and Sexual Activity  . Alcohol use: Not Currently    Alcohol/week: 1.0 standard drinks    Types: 1 Cans of beer per week    Comment: occ  . Drug use: Not Currently    Types: Marijuana    Comment: denies 06/17/2015  . Sexual activity: Not Currently  Lifestyle  . Physical activity:    Days per week: Not on file    Minutes per session: Not on file  . Stress: Not on file  Relationships  . Social connections:    Talks on phone: Not on file    Gets together: Not on file    Attends religious service: Not on file    Active member of club or organization: Not on file    Attends meetings of clubs or organizations: Not on file    Relationship status: Not on file  Other  Topics Concern  . Not on file  Social History Narrative   Divorced.  2 sons.  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   Additional Social History:    Pain Medications: See MAR Prescriptions: See MAR Over the Counter: See MAR History of alcohol / drug use?: Yes Negative Consequences of Use: Financial, Legal, Personal relationships, Work / School Withdrawal Symptoms: Cramps, Irritability, Diarrhea Name of Substance 1: Alcohol.  1 - Age of First Use: UTA 1 - Amount (size/oz): Per chart, pt drank three, 40oz beers today. Pt's BAL was 163 at 2116. 1 - Frequency: Daily. 1 - Duration: Ongoing. 1 - Last Use / Amount: Today (05/02/2018) Name of Substance 2: Cigarettes. 2 - Age of First Use: UTA 2 - Amount (size/oz): Pt reported, smoking a pack and a half of cigarettes daily.  2 - Frequency:  Daily.  2 - Duration: Ongoing.  2 - Last Use / Amount: Daily.                 Sleep: Fair  Appetite:  Fair  Current Medications: Current Facility-Administered Medications  Medication Dose Route Frequency Provider Last Rate Last Dose  . acetaminophen (TYLENOL) tablet 650 mg  650 mg Oral Q6H PRN Aldean Baker, NP   650 mg at 05/07/18 0443  . alum & mag hydroxide-simeth (MAALOX/MYLANTA) 200-200-20 MG/5ML suspension 30 mL  30 mL Oral Q4H PRN Charm Rings, NP      . carvedilol (COREG) tablet 12.5 mg  12.5 mg Oral BID WC Charm Rings, NP   12.5 mg at 05/07/18 0753  . clopidogrel (PLAVIX) tablet 75 mg  75 mg Oral Daily Charm Rings, NP   75 mg at 05/07/18 0753  . famotidine (PEPCID) tablet 20 mg  20 mg Oral Daily Cobos, Rockey Situ, MD   20 mg at 05/07/18 0754  . FLUoxetine (PROZAC) capsule 10 mg  10 mg Oral Daily Charm Rings, NP   10 mg at 05/07/18 0753  . folic acid (FOLVITE) tablet 1 mg  1 mg Oral Daily Antonieta Pert, MD   1 mg at 05/07/18 0754  . magnesium hydroxide (MILK OF MAGNESIA) suspension 30 mL  30 mL Oral Daily PRN Charm Rings, NP      . multivitamin with minerals tablet 1 tablet  1 tablet Oral Daily Oneta Rack, NP   1 tablet at 05/07/18 0754  . nicotine (NICODERM CQ - dosed in mg/24 hours) patch 21 mg  21 mg Transdermal Daily Cobos, Rockey Situ, MD   21 mg at 05/07/18 0752  . thiamine (B-1) injection 100 mg  100 mg Intramuscular Once Oneta Rack, NP      . thiamine (VITAMIN B-1) tablet 100 mg  100 mg Oral Daily Oneta Rack, NP   100 mg at 05/07/18 0753  . traZODone (DESYREL) tablet 100 mg  100 mg Oral QHS PRN Antonieta Pert, MD   100 mg at 05/06/18 2112    Lab Results:  Results for orders placed or performed during the hospital encounter of 05/03/18 (from the past 48 hour(s))  Amylase     Status: None   Collection Time: 05/06/18  6:40 AM  Result Value Ref Range   Amylase 86 28 - 100 U/L    Comment: Performed at [facility], [address], [place], [place] [address]  Lipase, blood     Status: None   Collection Time: 05/06/18  6:40 AM  Result Value Ref Range   Lipase 45 11 - 51 U/L    Comment: Performed at [facility], [address] [place], [place] [address]    Blood Alcohol level:  Lab Results  Component Value Date   ETH 163 (H) 05/02/2018   ETH <10 04/10/2017    Metabolic Disorder Labs: Lab Results  Component Value Date   HGBA1C 5.5 05/05/2018   MPG 111.15 05/05/2018   MPG 122.63 08/21/2017   No results found for: PROLACTIN Lab Results  Component Value Date   CHOL 127 05/05/2018   TRIG 66 05/05/2018   HDL 45 05/05/2018   CHOLHDL 2.8 05/05/2018   VLDL 13 05/05/2018   LDLCALC 69 05/05/2018   LDLCALC 89 03/15/2016    Physical Findings: AIMS: Facial and Oral Movements Muscles of Facial Expression: None, normal Lips and Perioral Area: None, normal Jaw: None, normal Tongue: None, normal,Extremity Movements Upper (arms, wrists, hands, fingers): None, normal Lower (legs, knees, ankles, toes): None, normal, Trunk Movements Neck, shoulders, hips: None, normal, Overall Severity Severity of abnormal movements (highest score from questions above): None, normal Incapacitation due to abnormal movements: None, normal Patient's awareness of abnormal movements (rate only patient's report): No Awareness, Dental Status Current problems with teeth and/or dentures?: No Does patient usually wear dentures?: No  CIWA:  CIWA-Ar Total: 1 COWS:  COWS Total Score: 3  Musculoskeletal: Strength & Muscle Tone: within normal limits Gait & Station: normal Patient leans: N/A  Psychiatric Specialty Exam: Physical Exam  Nursing note and vitals reviewed. Constitutional: He is oriented to person, place, and time. He appears well-developed and well-nourished.  Cardiovascular: Normal rate.  Respiratory: Effort normal.  Neurological: He is alert and oriented to person, place, and  time.    Review of Systems  Constitutional: Negative.   Psychiatric/Behavioral: Positive for depression and substance abuse (ETOH). Negative for hallucinations, memory loss and suicidal ideas. The patient has insomnia (improving). The patient is not nervous/anxious.     Blood pressure (!) 158/96, pulse 91, temperature 98.1 F (36.7 C), temperature source Oral, resp. rate 16, height  (1.803 m), weight 68 kg, SpO2 100 %.Body mass index is 20.92 kg/m.  General Appearance: Casual  Eye Contact:  Good  Speech:  Normal Rate  Volume:  Normal  Mood:  Anxious  Affect:  Full Range  Thought Process:  Coherent  Orientation:  Full (Time, Place, and Person)  Thought Content:  WDL  Suicidal Thoughts:  No  Homicidal Thoughts:  No  Memory:  Immediate;   Fair Recent;   Fair  Judgement:  Fair  Insight:  Limited  Psychomotor Activity:  Normal  Concentration:  Concentration: Fair  Recall:  [employer] of Knowledge:  Fair  Language:  Good  Akathisia:  No  Handed:  Right  AIMS (if indicated):     Assets:  Communication Skills Desire for Improvement Leisure Time Resilience  ADL's:  Intact  Cognition:  WNL  Sleep:  Number of Hours: 5     Treatment Plan Summary: Daily contact with patient to assess and evaluate symptoms and progress in treatment and Medication management   Continue inpatient hospitalization.  Start Orajel BID PRN tooth pain Continue Prozac 10 mg PO daily for mood Continue Coreg 12.5 mg PO daily for HTN Continue Plavix 75 mg PO daily for anticoagulation Continue Pepcid 20 mg PO daily for GERD Continue folic acid 1 mg Po daily for supplementation Continue thiamine 100 mg PO daily for supplementation Continue  trazodone 100 mg PO QHS PRN insomnia  Patient will participate in the therapeutic group milieu.  Discharge disposition in progress.   Aldean Baker, NP 05/07/2018, 9:16 AM

## 2018-05-07 NOTE — Progress Notes (Signed)
The patient rated his as an 8 out of a possible 10. He states that he dealt with a toothache which has since been resolved. He then spoke at great length about how his bed will not be [facility] at [facility] until Monday, but he is scheduled to go home tomorrow. He does not feel that he is ready for discharge and that he can not be alone for two days without relapsing on alcohol. His goal for tomorrow is to speak with his doctor about extending his admission to Monday if possible.

## 2018-05-07 NOTE — Therapy (Signed)
Occupational Therapy Group Note  Date:  05/07/2018 Time:  11:26 AM  Group Topic/Focus:  Sleep Hygiene  Participation Level:  Active  Participation Quality:  Appropriate  Affect:  Blunted  Cognitive:  Appropriate  Insight: Improving  Engagement in Group:  Engaged  Modes of Intervention:  Activity, Discussion, Education and Socialization  Additional Comments:    S: "I have not been sleeping well lately"  O: Education given on sleep hygiene this date to help pt improve BADL routine. Pt given worksheet to help facilitate understanding and carryover. Pt asked to choose preferred sleep hygiene strategy to implement this date and share insight on current practices.   A: Pt presents to group with blunted affect, engaged and participatory throughout entirety of session. Pt shares that he has had difficulty sleeping lately. He shares that using a fan helps him fall asleep easier. He shares he is willing to implement relaxation strategies this date.   P: OT tx will be 1x per week while pt inpatient.  Dalphine Handing, MSOT, OTR/L Behavioral Health OT/ Acute Relief OT PHP Office: [phone]  Dalphine Handing 05/07/2018, 11:26 AM

## 2018-05-07 NOTE — Progress Notes (Signed)
Patient self inventory- Patient slept fair last night, sleep medication was requested and was helpful. Appetite is poor, energy level low, and concentration poor. Depression, hopelessness, and anxiety are rated 8, 9, and 10. Endorses cravings, chilling, irritability, and sweating. Endorses passive SI "sometimes." (Patient said he is not suicidal in the hospital, but "if they tell me to leave then I don't know). Patient complains of pain rated 8/10 in his abdomen. Patient's goal is "to plead to see if I can stay until my appt on March 2nd. I'm not ready to leave yet." Patient is also complaining of mouth pain all of a sudden. Pt asked for Orajel and antibiotics. NP notified. Patient is compliant with medications prescribed per provider. No side effects noted. Safety is maintained with 15 minute checks as well as environmental checks. Will continue to monitor and provide support.  Patient stated to writer, "I don't know why they can't keep me... I don't have anywhere to go in between here and my appointment. There are plenty of open beds, I can't leave here. If I leave, I don't know what will happen to me." Patient was encouraged to speak with social worker and MD/NP.

## 2018-05-07 NOTE — Progress Notes (Signed)
D: Pt was in dayroom upon initial approach.  Pt presents with depressed affect and mood.  He says "they're going to release me on Friday but I'm going to talk to the doctor about possibly staying until Monday."  His goal tonight is to "sleep."  Pt denies SI/HI, denies hallucinations, denies pain.  Pt has been visible in milieu interacting with peers and staff appropriately.  Pt attended evening group.    A: Introduced self to pt.  Met with pt 1:1.  Actively listened to pt and offered support and encouragement.  PRN medication administered for sleep.  Q15 minute safety checks maintained.  R: Pt is safe on the unit.  Pt is compliant with medication.  Pt verbally contracts for safety.  Will continue to monitor and assess.

## 2018-05-08 MED ORDER — FLUOXETINE HCL 10 MG PO CAPS
10.0000 mg | ORAL_CAPSULE | Freq: Once | ORAL | Status: AC
  Administered 2018-05-08: 10 mg via ORAL
  Filled 2018-05-08: qty 1

## 2018-05-08 MED ORDER — HYDROXYZINE HCL 50 MG PO TABS
50.0000 mg | ORAL_TABLET | Freq: Three times a day (TID) | ORAL | Status: DC | PRN
  Administered 2018-05-08 – 2018-05-09 (×3): 50 mg via ORAL
  Filled 2018-05-08: qty 1
  Filled 2018-05-08: qty 30
  Filled 2018-05-08 (×2): qty 1

## 2018-05-08 MED ORDER — FLUOXETINE HCL 20 MG PO CAPS
20.0000 mg | ORAL_CAPSULE | Freq: Every day | ORAL | Status: DC
  Administered 2018-05-09: 20 mg via ORAL
  Filled 2018-05-08 (×2): qty 1
  Filled 2018-05-08 (×2): qty 28

## 2018-05-08 NOTE — Progress Notes (Signed)
Patient stated he is a nervous wreck, is worried about discharging tomorrow and hopes that [facility] has a bed for him tomorrow.  Patient requests vistaril for anxiety like he was given last night.

## 2018-05-08 NOTE — [facility] Group Notes (Signed)
[facility] LCSW Group Therapy Note  Date/Time  Type of Therapy/Topic:  Group Therapy:  Feelings about Diagnosis  Participation Level:  Active   Mood: Depressed  Description of Group:    This group will allow patients to explore their thoughts and feelings about diagnoses they have received. Patients will be guided to explore their level of understanding and acceptance of these diagnoses. Facilitator will encourage patients to process their thoughts and feelings about the reactions of others to their diagnosis, and will guide patients in identifying ways to discuss their diagnosis with significant others in their lives. This group will be process-oriented, with patients participating in exploration of their own experiences as well as giving and receiving support and challenge from other group members.   Therapeutic Goals: 1. Patient will demonstrate understanding of diagnosis as evidence by identifying two or more symptoms of the disorder:  2. Patient will be able to express two feelings regarding the diagnosis 3. Patient will demonstrate ability to communicate their needs through discussion and/or role plays  Summary of Patient Progress: Douglas [name] attended the entire session. He stated that he was "here to get better". He is tired of trying to fight his disease by himself and he is willing to try something different.   Therapeutic Modalities:   Cognitive Behavioral Therapy Brief Therapy Feelings Identification   Marian Sorrow, MSW Intern 05/04/2018 3:06 PM

## 2018-05-08 NOTE — Progress Notes (Signed)
[facility] MD Progress Note  05/08/2018 11:53 AM Douglas [name]  MRN:  [id number] Subjective:  Douglas [name] is a 53 year old male with history of alcohol use disorder, depression, chronic pancreatitis, HTN, HLD, peripheral vascular disease, CAD with stents and CABG in 2018, presenting voluntarily for treatment of alcohol dependence and suicidal ideation. He reports relapsing on alcohol in December 2019 after 18 months sobriety.   Objective: Patient is seen and examined.  Patient is a 53 year old male with a past psychiatric history significant for alcohol dependence, history of chronic pancreatitis, hypertension, hyperlipidemia, peripheral vascular disease, coronary artery disease and depression.  He is seen in follow-up.  He is doing fine from a psychiatric and medical standpoint.  He has been seeking medical reasons to extend his hospitalization, but the work-up is been negative.  His CT scan of the abdomen looking at his pancreas really did not show any active pancreatic problems at this time.  He was attempting to get antibiotics for "an abscessed tooth", but had no fever or other symptoms.  He was not given antibiotics for this.  He is been hemodynamically stable with regard to alcohol withdrawal.  He was anxious after our visit today because the fact that the date of discharge will be tomorrow, and was mildly tachycardic with a rate of 102.  His last CIWA score from 2/25 was 1.  He slept approximately 6 hours last p.m.  He denied any suicidal ideation, homicidal ideation or any psychotic symptoms.  Principal Problem: Alcohol use disorder, severe, dependence (HCC) Diagnosis: Principal Problem:   Alcohol use disorder, severe, dependence (HCC) Active Problems:   Major depressive disorder, recurrent severe without psychotic features (HCC)  Total Time spent with patient: 15 minutes  Past Psychiatric History: See admission H&P  Past Medical History:  Past Medical History:  Diagnosis Date  . Anginal  pain (HCC)   . Coronary artery disease   . GERD (gastroesophageal reflux disease)   . History of acute pancreatitis 04/06/2015  . NSTEMI (non-ST elevated myocardial infarction) (HCC) 01/2014  . Pancreatitis   . PTSD (post-traumatic stress disorder)   . Tobacco abuse     Past Surgical History:  Procedure Laterality Date  . ABDOMINAL AORTOGRAM N/A 12/31/2017   Procedure: ABDOMINAL AORTOGRAM;  Surgeon: Nada Libman, MD;  [facility]: [facility];  Service: Cardiovascular;  Laterality: N/A;  . ABDOMINAL AORTOGRAM W/LOWER EXTREMITY N/A 11/06/2017   Procedure: ABDOMINAL AORTOGRAM W/LOWER EXTREMITY;  Surgeon: Kathleene Hazel, MD;  Location: [facility];  Service: Cardiovascular;  Laterality: N/A;  . CARDIAC CATHETERIZATION N/A 03/14/2016   Procedure: Left Heart Cath and Coronary Angiography;  Surgeon: Kathleene Hazel, MD;  Location: [facility] INVASIVE CV LAB;  Service: Cardiovascular;  Laterality: N/A;  . CORONARY ANGIOPLASTY WITH STENT PLACEMENT  01/18/2014   "1"  . CORONARY ARTERY BYPASS GRAFT N/A 03/19/2016   Procedure: CORONARY ARTERY BYPASS GRAFTING (CABG), ON PUMP, TIMES FIVE, USING LEFT INTERNAL MAMMARY ARTERY AND RIGHT GREATER SAPHENOUS VEIN HARVESTED ENDOSCOPICALLY;  Surgeon: Delight Ovens, MD;  Location: [facility] OR;  Service: Open Heart Surgery;  Laterality: N/A;  LIMA-LAD SVG-OM SVG-DIAG SEQ SVG-PD-PL  . CORONARY BALLOON ANGIOPLASTY N/A 04/11/2017   Procedure: CORONARY BALLOON ANGIOPLASTY;  Surgeon: Yvonne Kendall, MD;  [facility]: [facility];  Service: Cardiovascular;  Laterality: N/A;  Distal RCA  . CORONARY STENT INTERVENTION N/A 11/06/2017   Procedure: CORONARY STENT INTERVENTION;  Surgeon: Kathleene Hazel, MD;  [facility]: [facility];  Service: Cardiovascular;  Laterality:  N/A;  . Drucie IpGROIN DEBRIDEMENT Right 01/07/2018   Procedure: INCISION AND DEBRIDEMENT OF right GROIN WOUND;  Surgeon: Chuck Hintickson, Christopher S, MD;  [facility];  Service:  Vascular;  Laterality: Right;  . INTRAVASCULAR PRESSURE WIRE/FFR STUDY N/A 04/11/2017   Procedure: INTRAVASCULAR PRESSURE WIRE/FFR STUDY;  Surgeon: Yvonne KendallEnd, Christopher, MD;  [facility];  Service: Cardiovascular;  Laterality: N/A;  . LEFT HEART CATH AND CORS/GRAFTS ANGIOGRAPHY N/A 04/11/2017   Procedure: LEFT HEART CATH AND CORS/GRAFTS ANGIOGRAPHY;  Surgeon: Yvonne KendallEnd, Christopher, MD;  [facility];  Service: Cardiovascular;  Laterality: N/A;  . LEFT HEART CATH AND CORS/GRAFTS ANGIOGRAPHY N/A 11/06/2017   Procedure: LEFT HEART CATH AND CORS/GRAFTS ANGIOGRAPHY;  Surgeon: Kathleene HazelMcAlhany, Christopher D, MD;  [facility];  Service: Cardiovascular;  Laterality: N/A;  . LEFT HEART CATHETERIZATION WITH CORONARY ANGIOGRAM N/A 01/18/2014   Procedure: LEFT HEART CATHETERIZATION WITH CORONARY ANGIOGRAM;  Surgeon: Runell GessJonathan J Berry, MD;  [facility];  Service: Cardiovascular;  Laterality: N/A;  . LOWER EXTREMITY ANGIOGRAPHY Bilateral 12/31/2017   Procedure: Lower Extremity Angiography;  Surgeon: Nada LibmanBrabham, Vance W, MD;  [facility];  Service: Cardiovascular;  Laterality: Bilateral;  . PERIPHERAL VASCULAR INTERVENTION Left 12/31/2017   Procedure: PERIPHERAL VASCULAR INTERVENTION;  Surgeon: Nada LibmanBrabham, Vance W, MD;  [facility];  Service: Cardiovascular;  Laterality: Left;  ext iliac  . TEE WITHOUT CARDIOVERSION N/A 03/19/2016   Procedure: TRANSESOPHAGEAL ECHOCARDIOGRAM (TEE);  Surgeon: Delight OvensEdward B Gerhardt, MD;  [facility];  Service: Open Heart Surgery;  Laterality: N/A;   Family History:  Family History  Problem Relation Age of Onset  . Cancer Mother        Lung cancer  . Coronary artery disease Mother   . Heart failure Maternal Grandmother    Family Psychiatric  History: See admission H&P Social History:  Social History   Substance and Sexual Activity  Alcohol Use Not Currently  . Alcohol/week: 1.0 standard drinks  . Types: 1  Cans of beer per week   Comment: occ     Social History   Substance and Sexual Activity  Drug Use Not Currently  . Types: Marijuana   Comment: denies 06/17/2015    Social History   Socioeconomic History  . Marital status: Divorced    Spouse name: Not on file  . Number of children: 2  . Years of education: Not on file  . Highest education level: Not on file  Occupational History  . Occupation: [employer]  Social Needs  . Financial resource strain: Not on file  . Food insecurity:    Worry: Not on file    Inability: Not on file  . Transportation needs:    Medical: Not on file    Non-medical: Not on file  Tobacco Use  . Smoking status: Current Every Day Smoker    Packs/day: 1.00    Years: 31.00    Pack years: 31.00    Types: Cigarettes  . Smokeless tobacco: Never Used  Substance and Sexual Activity  . Alcohol use: Not Currently    Alcohol/week: 1.0 standard drinks    Types: 1 Cans of beer per week    Comment: occ  . Drug use: Not Currently    Types: Marijuana    Comment: denies 06/17/2015  . Sexual activity: Not Currently  Lifestyle  . Physical activity:    Days per week: Not on file    Minutes per session: Not on file  .  Stress: Not on file  Relationships  . Social connections:    Talks on phone: Not on file    Gets together: Not on file    Attends religious service: Not on file    Active member of club or organization: Not on file    Attends meetings of clubs or organizations: Not on file    Relationship status: Not on file  Other Topics Concern  . Not on file  Social History Narrative   Divorced.  2 sons.  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   Additional Social History:    Pain Medications: See MAR Prescriptions: See MAR Over the Counter: See MAR History of alcohol / drug use?: Yes Negative Consequences of Use: Financial, Legal, Personal relationships, Work / School Withdrawal Symptoms: Cramps, Irritability, Diarrhea Name  of Substance 1: Alcohol.  1 - Age of First Use: UTA 1 - Amount (size/oz): Per chart, pt drank three, 40oz beers today. Pt's BAL was 163 at 2116. 1 - Frequency: Daily. 1 - Duration: Ongoing. 1 - Last Use / Amount: Today (05/02/2018) Name of Substance 2: Cigarettes. 2 - Age of First Use: UTA 2 - Amount (size/oz): Pt reported, smoking a pack and a half of cigarettes daily.  2 - Frequency: Daily.  2 - Duration: Ongoing.  2 - Last Use / Amount: Daily.                 Sleep: Good  Appetite:  Good  Current Medications: Current Facility-Administered Medications  Medication Dose Route Frequency Provider Last Rate Last Dose  . acetaminophen (TYLENOL) tablet 650 mg  650 mg Oral Q6H PRN Aldean Baker, NP   650 mg at 05/07/18 1717  . alum & mag hydroxide-simeth (MAALOX/MYLANTA) 200-200-20 MG/5ML suspension 30 mL  30 mL Oral Q4H PRN Charm Rings, NP      . benzocaine (ORAJEL) 10 % mucosal gel   Mouth/Throat BID PRN Aldean Baker, NP      . carvedilol (COREG) tablet 12.5 mg  12.5 mg Oral BID WC Charm Rings, NP   12.5 mg at 05/08/18 0735  . clopidogrel (PLAVIX) tablet 75 mg  75 mg Oral Daily Charm Rings, NP   75 mg at 05/08/18 0735  . famotidine (PEPCID) tablet 20 mg  20 mg Oral Daily Cobos, Rockey Situ, MD   20 mg at 05/08/18 0735  . FLUoxetine (PROZAC) capsule 10 mg  10 mg Oral Daily Charm Rings, NP   10 mg at 05/08/18 0735  . folic acid (FOLVITE) tablet 1 mg  1 mg Oral Daily Antonieta Pert, MD   1 mg at 05/08/18 0736  . magnesium hydroxide (MILK OF MAGNESIA) suspension 30 mL  30 mL Oral Daily PRN Charm Rings, NP      . multivitamin with minerals tablet 1 tablet  1 tablet Oral Daily Oneta Rack, NP   1 tablet at 05/08/18 0735  . nicotine (NICODERM CQ - dosed in mg/24 hours) patch 21 mg  21 mg Transdermal Daily Cobos, Rockey Situ, MD   21 mg at 05/08/18 0736  . thiamine (B-1) injection 100 mg  100 mg Intramuscular Once Oneta Rack, NP      . thiamine (VITAMIN  B-1) tablet 100 mg  100 mg Oral Daily Oneta Rack, NP   100 mg at 05/08/18 0736  . traZODone (DESYREL) tablet 100 mg  100 mg Oral QHS PRN Antonieta Pert,  MD   100 mg at 05/07/18 2100    Lab Results: No results found for this or any previous visit (from the past 48 hour(s)).  Blood Alcohol level:  Lab Results  Component Value Date   ETH 163 (H) 05/02/2018   ETH <10 04/10/2017    Metabolic Disorder Labs: Lab Results  Component Value Date   HGBA1C 5.5 05/05/2018   MPG 111.15 05/05/2018   MPG 122.63 08/21/2017   No results found for: PROLACTIN Lab Results  Component Value Date   CHOL 127 05/05/2018   TRIG 66 05/05/2018   HDL 45 05/05/2018   CHOLHDL 2.8 05/05/2018   VLDL 13 05/05/2018   LDLCALC 69 05/05/2018   LDLCALC 89 03/15/2016    Physical Findings: AIMS: Facial and Oral Movements Muscles of Facial Expression: None, normal Lips and Perioral Area: None, normal Jaw: None, normal Tongue: None, normal,Extremity Movements Upper (arms, wrists, hands, fingers): None, normal Lower (legs, knees, ankles, toes): None, normal, Trunk Movements Neck, shoulders, hips: None, normal, Overall Severity Severity of abnormal movements (highest score from questions above): None, normal Incapacitation due to abnormal movements: None, normal Patient's awareness of abnormal movements (rate only patient's report): No Awareness, Dental Status Current problems with teeth and/or dentures?: No Does patient usually wear dentures?: No  CIWA:  CIWA-Ar Total: 1 COWS:  COWS Total Score: 3  Musculoskeletal: Strength & Muscle Tone: within normal limits Gait & Station: normal Patient leans: N/A  Psychiatric Specialty Exam: Physical Exam  Nursing note and vitals reviewed. Constitutional: He is oriented to person, place, and time. He appears well-developed and well-nourished.  HENT:  Head: Normocephalic and atraumatic.  Respiratory: Effort normal.  Neurological: He is alert and oriented  to person, place, and time.    ROS  Blood pressure 117/90, pulse (!) 102, temperature 98.1 F (36.7 C), temperature source Oral, resp. rate 16, height 5\' 11"  (1.803 m), weight 68 kg, SpO2 100 %.Body mass index is 20.92 kg/m.  General Appearance: Casual  Eye Contact:  Fair  Speech:  Normal Rate  Volume:  Normal  Mood:  Anxious  Affect:  Congruent  Thought Process:  Coherent and Descriptions of Associations: Intact  Orientation:  Full (Time, Place, and Person)  Thought Content:  Logical  Suicidal Thoughts:  No  Homicidal Thoughts:  No  Memory:  Immediate;   Fair Recent;   Fair Remote;   Fair  Judgement:  Intact  Insight:  Lacking  Psychomotor Activity:  Normal  Concentration:  Concentration: Fair and Attention Span: Fair  Recall:  [employer] of Knowledge:  Fair  Language:  Good  Akathisia:  Negative  Handed:  Right  AIMS (if indicated):     Assets:  Communication Skills Desire for Improvement Resilience  ADL's:  Intact  Cognition:  WNL  Sleep:  Number of Hours: 5.75     Treatment Plan Summary: Daily contact with patient to assess and evaluate symptoms and progress in treatment, Medication management and Plan : Patient is seen and examined.  Patient is a 53 year old male with the above-stated past psychiatric history who is seen in follow-up.  Patient is doing fairly well.  He is anxious about possible discharge.  He would like to be able to stay until the date he gets into rehabilitation, but he remains nonsuicidal, non-psychotic, not showing any evidence of withdrawal.  I will go on and increase his fluoxetine to 20 mg p.o. daily, but no other changes to his current medications.  We will plan on discharge  tomorrow.  He has an interview today at 2 PM for a rehabilitation facility, but I have already warned him that the wait list to get in there could be 2 weeks.  He has an interview on Monday with another facility locally. 1.  Increase fluoxetine to 20 mg p.o. daily for mood  and anxiety. 2.  Lorazepam can be stopped for alcohol withdrawal symptoms. 3.  Continue Coreg 12.5 mg p.o. twice daily for hypertension and coronary artery disease. 4.  Continue Plavix 75 mg p.o. daily for vascular disease. 5.  Continue Pepcid 20 mg p.o. daily for gastric protection. 6.  Continue thiamine 100 mg p.o. daily for nutritional supplementation. 7.  Continue trazodone 100 mg p.o. nightly as needed insomnia. 8.  CT scan of the abdomen with no new acute processes. 9.  Disposition planning-probable discharge in a.m. 2/28.  Antonieta Pert, MD 05/08/2018, 11:53 AM

## 2018-05-08 NOTE — Progress Notes (Signed)
CSW aware patient completed his phone screening with [facility] this afternoon. CSW left VM for intake coordinator at [facility] to follow up.   Patient expected to discharge tomorrow, he will likely follow up with a local shelter (ex-mother in law did not think she had room for him). Patient has a [facility] screening on Monday, 03/02 at 7:45am.  Enid Cutter, LCSW-A Clinical Social Worker

## 2018-05-08 NOTE — Progress Notes (Signed)
D:  Patient's self inventory sheet, patient has poor sleep, sleep medication not helpful.  Poor appetite, low energy level, poor concentration.  Rated depression, hopeless and anxiety 10.  Withdrawals, tremors, diarrhea, chilling.  SI, contracts for safety.  Physical problems, lightheaded.  Denied physical pain.  Goal is talk to MD.  Plans to talk to MD.  No discharge plans. A:  Medications administered per MD orders.  Emotional support and encouragement given patient. R:  Denied SI and HI, contracts for safety.  Denied A/V hallucinations.  Safety maintained with 15 minute checks. Patient is very concerned about his discharge plans.  Wants to discharge to [facility], does not want to be on the street drinking.

## 2018-05-08 NOTE — Plan of Care (Signed)
Nurse discussed anxiety, depression, suicidal thoughts and coping skills with patient.

## 2018-05-08 NOTE — Progress Notes (Signed)
The patient's positive event for the day is that he received a phone call from [facility]. He states that he will be discharged tomorrow possibly but is unclear if he has a bed at [facility]. His goal for tomorrow is to get discharged.

## 2018-05-09 ENCOUNTER — Emergency Department (HOSPITAL_COMMUNITY): Payer: Self-pay

## 2018-05-09 ENCOUNTER — Other Ambulatory Visit: Payer: Self-pay

## 2018-05-09 ENCOUNTER — Emergency Department (HOSPITAL_COMMUNITY)
Admission: EM | Admit: 2018-05-09 | Discharge: 2018-05-10 | Disposition: A | Discharge status: Home / Self Care | Payer: Self-pay | Attending: Emergency Medicine | Admitting: Emergency Medicine

## 2018-05-09 DIAGNOSIS — Z7902 Long term (current) use of antithrombotics/antiplatelets: Secondary | ICD-10-CM | POA: Insufficient documentation

## 2018-05-09 DIAGNOSIS — Z79899 Other long term (current) drug therapy: Secondary | ICD-10-CM | POA: Insufficient documentation

## 2018-05-09 DIAGNOSIS — I10 Essential (primary) hypertension: Secondary | ICD-10-CM | POA: Insufficient documentation

## 2018-05-09 DIAGNOSIS — K2900 Acute gastritis without bleeding: Secondary | ICD-10-CM | POA: Insufficient documentation

## 2018-05-09 DIAGNOSIS — F17210 Nicotine dependence, cigarettes, uncomplicated: Secondary | ICD-10-CM | POA: Insufficient documentation

## 2018-05-09 DIAGNOSIS — I252 Old myocardial infarction: Secondary | ICD-10-CM | POA: Insufficient documentation

## 2018-05-09 DIAGNOSIS — Z951 Presence of aortocoronary bypass graft: Secondary | ICD-10-CM | POA: Insufficient documentation

## 2018-05-09 DIAGNOSIS — I2510 Atherosclerotic heart disease of native coronary artery without angina pectoris: Secondary | ICD-10-CM | POA: Insufficient documentation

## 2018-05-09 DIAGNOSIS — Z955 Presence of coronary angioplasty implant and graft: Secondary | ICD-10-CM | POA: Insufficient documentation

## 2018-05-09 LAB — COMPREHENSIVE METABOLIC PANEL
ALT: 52 U/L — ABNORMAL HIGH (ref 0–44)
AST: 45 U/L — ABNORMAL HIGH (ref 15–41)
Albumin: 3 g/dL — ABNORMAL LOW (ref 3.5–5.0)
Alkaline Phosphatase: 105 U/L (ref 38–126)
Anion gap: 5 (ref 5–15)
BUN: 5 mg/dL — ABNORMAL LOW (ref 6–20)
CALCIUM: 8.5 mg/dL — AB (ref 8.9–10.3)
CO2: 28 mmol/L (ref 22–32)
Chloride: 106 mmol/L (ref 98–111)
Creatinine, Ser: 1.06 mg/dL (ref 0.61–1.24)
GFR calc Af Amer: >60 mL/min (ref >60)
GFR calc non Af Amer: >60 mL/min (ref >60)
Glucose, Bld: 103 mg/dL — ABNORMAL HIGH (ref 70–99)
Potassium: 4.6 mmol/L (ref 3.5–5.1)
Sodium: 139 mmol/L (ref 135–145)
Total Bilirubin: 0.8 mg/dL (ref 0.3–1.2)
Total Protein: 5.8 g/dL — ABNORMAL LOW (ref 6.5–8.1)

## 2018-05-09 LAB — CBC WITH DIFFERENTIAL/PLATELET
Abs Immature Granulocytes: 0.03 10*3/uL (ref 0.00–0.07)
BASOS ABS: 0.1 10*3/uL (ref 0.0–0.1)
Basophils Relative: 1 %
Eosinophils Absolute: 0.3 10*3/uL (ref 0.0–0.5)
Eosinophils Relative: 4 %
HCT: 44.1 % (ref 39.0–52.0)
Hemoglobin: 14.4 g/dL (ref 13.0–17.0)
IMMATURE GRANULOCYTES: 0 %
Lymphocytes Relative: 25 %
Lymphs Abs: 1.8 10*3/uL (ref 0.7–4.0)
MCH: 33.3 pg (ref 26.0–34.0)
MCHC: 32.7 g/dL (ref 30.0–36.0)
MCV: 101.8 fL — ABNORMAL HIGH (ref 80.0–100.0)
Monocytes Absolute: 1 10*3/uL (ref 0.1–1.0)
Monocytes Relative: 13 %
NEUTROS PCT: 57 %
Neutro Abs: 4.3 10*3/uL (ref 1.7–7.7)
Platelets: 242 10*3/uL (ref 150–400)
RBC: 4.33 MIL/uL (ref 4.22–5.81)
RDW: 13.3 % (ref 11.5–15.5)
WBC: 7.5 10*3/uL (ref 4.0–10.5)
nRBC: 0 % (ref 0.0–0.2)

## 2018-05-09 LAB — I-STAT TROPONIN, ED: Troponin i, poc: 0 ng/mL (ref 0.00–0.08)

## 2018-05-09 MED ORDER — NITROGLYCERIN 0.4 MG SL SUBL: 0.4000 mg | SUBLINGUAL_TABLET | SUBLINGUAL | 0 refills | Status: DC | PRN

## 2018-05-09 MED ORDER — ALUM & MAG HYDROXIDE-SIMETH 200-200-20 MG/5ML PO SUSP
30.0000 mL | Freq: Once | ORAL | Status: AC
  Administered 2018-05-09: 30 mL via ORAL
  Filled 2018-05-09: qty 30

## 2018-05-09 MED ORDER — TRAZODONE HCL 100 MG PO TABS: 100.0000 mg | ORAL_TABLET | Freq: Every evening | ORAL | 0 refills | Status: AC | PRN

## 2018-05-09 MED ORDER — FAMOTIDINE 20 MG PO TABS: 20.0000 mg | ORAL_TABLET | Freq: Every day | ORAL | 0 refills | Status: DC

## 2018-05-09 MED ORDER — ADULT MULTIVITAMIN W/MINERALS CH: 1.0000 | ORAL_TABLET | Freq: Every day | ORAL | Status: AC

## 2018-05-09 MED ORDER — LIDOCAINE VISCOUS HCL 2 % MT SOLN
15.0000 mL | Freq: Once | OROMUCOSAL | Status: AC
  Administered 2018-05-09: 15 mL via ORAL
  Filled 2018-05-09: qty 15

## 2018-05-09 MED ORDER — BENZOCAINE 10 % MT GEL: Freq: Two times a day (BID) | OROMUCOSAL | 0 refills | Status: AC | PRN

## 2018-05-09 MED ORDER — CLOPIDOGREL BISULFATE 75 MG PO TABS: 75.0000 mg | ORAL_TABLET | Freq: Every day | ORAL | 0 refills | Status: AC

## 2018-05-09 MED ORDER — NICOTINE 21 MG/24HR TD PT24: 21.0000 mg | MEDICATED_PATCH | Freq: Every day | TRANSDERMAL | 0 refills | Status: AC

## 2018-05-09 MED ORDER — CARVEDILOL 12.5 MG PO TABS: 12.5000 mg | ORAL_TABLET | Freq: Two times a day (BID) | ORAL | 0 refills | Status: AC

## 2018-05-09 MED ORDER — HYDROXYZINE HCL 50 MG PO TABS: 50.0000 mg | ORAL_TABLET | Freq: Three times a day (TID) | ORAL | 0 refills | Status: AC | PRN

## 2018-05-09 MED ORDER — MORPHINE SULFATE (PF) 2 MG/ML IV SOLN
2.0000 mg | Freq: Once | INTRAVENOUS | Status: AC
  Administered 2018-05-09: 2 mg via INTRAVENOUS
  Filled 2018-05-09: qty 1

## 2018-05-09 MED ORDER — ROSUVASTATIN CALCIUM 40 MG PO TABS: 40.0000 mg | ORAL_TABLET | Freq: Every day | ORAL | 0 refills | Status: DC

## 2018-05-09 MED ORDER — FLUOXETINE HCL 20 MG PO CAPS: 20.0000 mg | ORAL_CAPSULE | Freq: Every day | ORAL | 0 refills | Status: AC

## 2018-05-09 NOTE — Tx Team (Signed)
Interdisciplinary Treatment and Diagnostic Plan Update  05/09/2018 Time of Session: 9:00am GLEB SWEARINGER MRN: [id number]  Principal Diagnosis: Alcohol use disorder, severe, dependence (HCC)  Secondary Diagnoses: Principal Problem:   Alcohol use disorder, severe, dependence (HCC) Active Problems:   Major depressive disorder, recurrent severe without psychotic features (HCC)   Current Medications:  Current Facility-Administered Medications  Medication Dose Route Frequency Provider Last Rate Last Dose  . acetaminophen (TYLENOL) tablet 650 mg  650 mg Oral Q6H PRN Aldean Baker, NP   650 mg at 05/07/18 1717  . alum & mag hydroxide-simeth (MAALOX/MYLANTA) 200-200-20 MG/5ML suspension 30 mL  30 mL Oral Q4H PRN Charm Rings, NP      . benzocaine (ORAJEL) 10 % mucosal gel   Mouth/Throat BID PRN Aldean Baker, NP      . carvedilol (COREG) tablet 12.5 mg  12.5 mg Oral BID WC Charm Rings, NP   12.5 mg at 05/09/18 0754  . clopidogrel (PLAVIX) tablet 75 mg  75 mg Oral Daily Charm Rings, NP   75 mg at 05/09/18 0754  . famotidine (PEPCID) tablet 20 mg  20 mg Oral Daily Cobos, Rockey Situ, MD   20 mg at 05/09/18 0754  . FLUoxetine (PROZAC) capsule 20 mg  20 mg Oral Daily Antonieta Pert, MD   20 mg at 05/09/18 0753  . folic acid (FOLVITE) tablet 1 mg  1 mg Oral Daily Antonieta Pert, MD   1 mg at 05/09/18 0754  . hydrOXYzine (ATARAX/VISTARIL) tablet 50 mg  50 mg Oral TID PRN Antonieta Pert, MD   50 mg at 05/09/18 0645  . magnesium hydroxide (MILK OF MAGNESIA) suspension 30 mL  30 mL Oral Daily PRN Charm Rings, NP      . multivitamin with minerals tablet 1 tablet  1 tablet Oral Daily Oneta Rack, NP   1 tablet at 05/09/18 0754  . nicotine (NICODERM CQ - dosed in mg/24 hours) patch 21 mg  21 mg Transdermal Daily Cobos, Rockey Situ, MD   21 mg at 05/09/18 0753  . thiamine (B-1) injection 100 mg  100 mg Intramuscular Once Oneta Rack, NP      . thiamine (VITAMIN B-1)  tablet 100 mg  100 mg Oral Daily Oneta Rack, NP   100 mg at 05/09/18 0754  . traZODone (DESYREL) tablet 100 mg  100 mg Oral QHS PRN Antonieta Pert, MD   100 mg at 05/08/18 2126   PTA Medications: Medications Prior to Admission  Medication Sig Dispense Refill Last Dose  . acetaminophen (TYLENOL) 325 MG tablet Take 2 tablets (650 mg total) by mouth every 6 (six) hours as needed for mild pain, fever or headache.   Unknown at Unknown time  . carvedilol (COREG) 12.5 MG tablet Take 1 tablet (12.5 mg total) by mouth 2 (two) times daily with a meal. 60 tablet 0 Unknown at Unknown time  . clopidogrel (PLAVIX) 75 MG tablet Take 1 tablet (75 mg total) by mouth daily. 30 tablet 0 Unknown at Unknown time  . famotidine (PEPCID) 20 MG tablet Take 1 tablet (20 mg total) by mouth 2 (two) times daily. (Patient not taking: Reported on 05/02/2018) 60 tablet 0 Unknown at Unknown time  . nitroGLYCERIN (NITROSTAT) 0.4 MG SL tablet Place 1 tablet (0.4 mg total) under the tongue every 5 (five) minutes as needed for chest pain. 25 tablet 0 Unknown at Unknown time  . ondansetron (ZOFRAN) 4 MG tablet  Take 1 tablet (4 mg total) by mouth every 6 (six) hours. (Patient not taking: Reported on 05/02/2018) 12 tablet 0 Unknown at Unknown time  . oxyCODONE (ROXICODONE) 5 MG immediate release tablet Take 1 tablet (5 mg total) by mouth every 6 (six) hours as needed for severe pain. 8 tablet 0 Unknown at Unknown time  . Oxycodone HCl 10 MG TABS Take 1 tablet (10 mg total) by mouth every 6 (six) hours as needed (Moderate or severe pain.). (Patient not taking: Reported on 01/20/2018)   Unknown at Unknown time  . Pseudoeph-Doxylamine-DM-APAP (NYQUIL PO) Take 30 mLs by mouth at bedtime as needed (cold symptoms).   Unknown at Unknown time  . rosuvastatin (CRESTOR) 40 MG tablet Take 1 tablet (40 mg total) by mouth daily at 6 PM. 30 tablet 0 Unknown at Unknown time    Patient Stressors: Educational concerns Financial  difficulties Health problems  Patient Strengths: Ability for insight Active sense of humor Average or above average intelligence  Treatment Modalities: Medication Management, Group therapy, Case management,  1 to 1 session with clinician, Psychoeducation, Recreational therapy.   Physician Treatment Plan for Primary Diagnosis: Alcohol use disorder, severe, dependence (HCC) Long Term Goal(s): Improvement in symptoms so as ready for discharge Improvement in symptoms so as ready for discharge   Short Term Goals: Ability to identify changes in lifestyle to reduce recurrence of condition will improve Ability to verbalize feelings will improve Ability to disclose and discuss suicidal ideas Ability to demonstrate self-control will improve Ability to identify and develop effective coping behaviors will improve Ability to identify triggers associated with substance abuse/mental health issues will improve  Medication Management: Evaluate patient's response, side effects, and tolerance of medication regimen.  Therapeutic Interventions: 1 to 1 sessions, Unit Group sessions and Medication administration.  Evaluation of Outcomes: Progressing  Physician Treatment Plan for Secondary Diagnosis: Principal Problem:   Alcohol use disorder, severe, dependence (HCC) Active Problems:   Major depressive disorder, recurrent severe without psychotic features (HCC)  Long Term Goal(s): Improvement in symptoms so as ready for discharge Improvement in symptoms so as ready for discharge   Short Term Goals: Ability to identify changes in lifestyle to reduce recurrence of condition will improve Ability to verbalize feelings will improve Ability to disclose and discuss suicidal ideas Ability to demonstrate self-control will improve Ability to identify and develop effective coping behaviors will improve Ability to identify triggers associated with substance abuse/mental health issues will improve      Medication Management: Evaluate patient's response, side effects, and tolerance of medication regimen.  Therapeutic Interventions: 1 to 1 sessions, Unit Group sessions and Medication administration.  Evaluation of Outcomes: Progressing   RN Treatment Plan for Primary Diagnosis: Alcohol use disorder, severe, dependence (HCC) Long Term Goal(s): Knowledge of disease and therapeutic regimen to maintain health will improve  Short Term Goals: Ability to demonstrate self-control, Ability to verbalize feelings will improve and Ability to identify and develop effective coping behaviors will improve  Medication Management: RN will administer medications as ordered by provider, will assess and evaluate patient's response and provide education to patient for prescribed medication. RN will report any adverse and/or side effects to prescribing provider.  Therapeutic Interventions: 1 on 1 counseling sessions, Psychoeducation, Medication administration, Evaluate responses to treatment, Monitor vital signs and CBGs as ordered, Perform/monitor CIWA, COWS, AIMS and Fall Risk screenings as ordered, Perform wound care treatments as ordered.  Evaluation of Outcomes: Progressing   LCSW Treatment Plan for Primary Diagnosis: Alcohol use disorder, severe,  dependence (HCC) Long Term Goal(s): Safe transition to appropriate next level of care at discharge, Engage patient in therapeutic group addressing interpersonal concerns.  Short Term Goals: Engage patient in aftercare planning with referrals and resources, Increase social support, Identify triggers associated with mental health/substance abuse issues and Increase skills for wellness and recovery  Therapeutic Interventions: Assess for all discharge needs, 1 to 1 time with Social worker, Explore available resources and support systems, Assess for adequacy in community support network, Educate family and significant other(s) on suicide prevention, Complete  Psychosocial Assessment, Interpersonal group therapy.  Evaluation of Outcomes: Progressing   Progress in Treatment: Attending groups: Yes. Participating in groups: Yes. Taking medication as prescribed: Yes. Toleration medication: Yes. Family/Significant other contact made: Yes, ex-mother in law Patient understands diagnosis: Yes. Discussing patient identified problems/goals with staff: Yes. Medical problems stabilized or resolved: No. Denies suicidal/homicidal ideation: No. Issues/concerns per patient self-inventory: Yes.  New problem(s) identified: No, Describe:  CSW continuing to assess  New Short Term/Long Term Goal(s): detox, medication management for mood stabilization; elimination of SI thoughts; development of comprehensive mental wellness/sobriety plan.  Patient Goals: "to detox, get sober, and stay sober."   Discharge Plan or Barriers: Discharge today with shelter resources. [facility] residential screening, Monday 03/02  Reason for Continuation of Hospitalization: Anxiety Depression Suicidal ideation  Estimated Length of Stay: discharge today  Attendees: Patient: Marden Tung 05/09/2018 8:55 AM  Physician: Marguerita Merles 05/09/2018 8:55 AM  Nursing:  05/09/2018 8:55 AM  RN Care Manager: 05/09/2018 8:55 AM  Social Worker: Enid Cutter, Theresia Majors 05/09/2018 8:55 AM  Recreational Therapist:  05/09/2018 8:55 AM  Other:  05/09/2018 8:55 AM  Other:  05/09/2018 8:55 AM  Other: 05/09/2018 8:55 AM    Scribe for Treatment Team: Darreld Mclean, LCSWA 05/09/2018 8:55 AM

## 2018-05-09 NOTE — ED Triage Notes (Signed)
Per ems pt was DC from [facility] today. Upon leaving pt went to gas station and drank four 40 ounce beers. Pt began having CP. Received 1 nitro and 324aspirin en route. Pain 7/10 sharp to central chest

## 2018-05-09 NOTE — Progress Notes (Signed)
Patient is not happy that he is being discharged. HE says " can't yall find me some place to stay...ibuprofen can't go to the shelter". Writer spoke with LCSW, who confirms plan for pt to be dc'd today has been developed throught the week and is no surprise to the patient. {Patient completed his daily assessment and on this he wrote he has had SI today and adds, when writer clarifys with him, " if I have a place to go I'll be okay...otherwise I'll be drunk by 1500". Writer notified Dr Jola Babinski of this statement. Pt contracted verbally with this wirter to not hurt himself and says " I just don't know if I can stay sober"> Pt encouraged to get to AA meeting ASAP. Dc teaching is done by this wirter, pt is given cc of dc instructins ( SRA, AVS, SSP and trnasition record) and then all belongigns are returned to pt and he is escorted to bldg entrance and dc'd per MD order.

## 2018-05-09 NOTE — Progress Notes (Signed)
Recreation Therapy Notes  Date:  2.28.20 Time: 0930 Location: 300 Hall Dayroom  Group Topic: Stress Management  Goal Area(s) Addresses:  Patient will identify positive stress management techniques. Patient will identify benefits of using stress management post d/c.  Behavioral Response: Engaged  Intervention: Stress Management  Activity :  Progressive Muscle Relaxation.  LRT introduced the stress management technique of stress management.  LRT read a script that focused on tensing and relaxing each muscle group individually.  Patients were to follow along as the script was read to engage in activity.  Education:  Stress Management, Discharge Planning.   Education Outcome: Acknowledges Education  Clinical Observations/Feedback:  Pt attended and participated in group.    Caroll Rancher, LRT/CTRS         Caroll Rancher A 05/09/2018 10:47 AM

## 2018-05-09 NOTE — [facility] Suicide Risk Assessment (Signed)
[facility] Discharge Suicide Risk Assessment   Principal Problem: Alcohol use disorder, severe, dependence (HCC) Discharge Diagnoses: Principal Problem:   Alcohol use disorder, severe, dependence (HCC) Active Problems:   Major depressive disorder, recurrent severe without psychotic features (HCC)   Total Time spent with patient: 15 minutes  Musculoskeletal: Strength & Muscle Tone: within normal limits Gait & Station: normal Patient leans: N/A  Psychiatric Specialty Exam: Review of Systems  All other systems reviewed and are negative.   Blood pressure (!) 140/98, pulse 95, temperature (!) 97.5 F (36.4 C), temperature source Oral, resp. rate 16, height 5\' 11"  (1.803 m), weight 68 kg, SpO2 100 %.Body mass index is 20.92 kg/m.  General Appearance: Casual  Eye Contact::  Fair  Speech:  Normal Rate409  Volume:  Normal  Mood:  Anxious  Affect:  Congruent  Thought Process:  Coherent and Descriptions of Associations: Intact  Orientation:  Full (Time, Place, and Person)  Thought Content:  Logical  Suicidal Thoughts:  No  Homicidal Thoughts:  No  Memory:  Immediate;   Fair Recent;   Fair Remote;   Fair  Judgement:  Intact  Insight:  Lacking  Psychomotor Activity:  Increased  Concentration:  Fair  Recall:  [employer] of Knowledge:Fair  Language: Fair  Akathisia:  Negative  Handed:  Right  AIMS (if indicated):     Assets:  Communication Skills Desire for Improvement Leisure Time Resilience  Sleep:  Number of Hours: 6.5  Cognition: WNL  ADL's:  Intact   Mental Status Per Nursing Assessment::   On Admission:  NA  Demographic Factors:  Male, Divorced or widowed, Caucasian, Low socioeconomic status and Unemployed  Loss Factors: Financial problems/change in socioeconomic status  Historical Factors: Impulsivity  Risk Reduction Factors:   Positive coping skills or problem solving skills  Continued Clinical Symptoms:  Depression:   Comorbid alcohol  abuse/dependence Impulsivity Alcohol/Substance Abuse/Dependencies  Cognitive Features That Contribute To Risk:  None    Suicide Risk:  Minimal: No identifiable suicidal ideation.  Patients presenting with no risk factors but with morbid ruminations; may be classified as minimal risk based on the severity of the depressive symptoms  Follow-up Information    [facility]. Go on 05/12/2018.   Why:  Please attend your intake appointment on 05/12/18 at 7:45am.  Contact information: [address] [place] [address] [phone]        [facility] Follow up.   Specialty:  [facility] information: [address] [place] [address] [phone]        [facility] Follow up.   Specialty:  Addiction Medicine Why:  Referral made 05/05/2018.  Contact information: [address] [place] [address] [phone]           Plan Of Care/Follow-up recommendations:  Activity:  ad lib  Antonieta Pert, MD 05/09/2018, 9:58 AM

## 2018-05-09 NOTE — Progress Notes (Signed)
D: Pt  passive SI- contracts for safety, denies HI/AVH. Pt is pleasant and cooperative. Pt stated he was worried that if he did not get into [facility] he would be on the street. Pt stated he did not have anywhere to go.   A: Pt was offered support and encouragement. Pt was given scheduled medications. Pt was encourage to attend groups. Q 15 minute checks were done for safety.   R: safety maintained on unit.   Problem: Coping: Goal: Coping ability will improve Outcome: Progressing

## 2018-05-09 NOTE — Progress Notes (Signed)
  [facility] Adult Case Management Discharge Plan :  Will you be returning to the same living situation after discharge:  No. Shelter resources, [facility] screening on 03/02. At discharge, do you have transportation home?: Yes,  bus pass Do you have the ability to pay for your medications: No. Medication samples.  Release of information consent forms completed and in the chart; bus pass and shelter list on chart.  Patient to Follow up at: Follow-up Information    [facility]. Go on 05/12/2018.   Why:  Please attend your intake appointment on 05/12/18 at 7:45am.  Contact information: [address] [place] [address] [phone]        [facility] Follow up.   Specialty:  Addiction Medicine Why:  Referral made 05/05/2018. If you wish to follow up with this referral, please call [phone]. Contact information: [address] [place] [address] [phone]           Next level of care provider has access to [facility] Link:no  Safety Planning and Suicide Prevention discussed: Yes,  with patient.  Have you used any form of tobacco in the last 30 days? (Cigarettes, Smokeless Tobacco, Cigars, and/or Pipes): Yes  Has patient been referred to the Quitline?: Patient refused referral  Patient has been referred for addiction treatment: Yes  Darreld Mclean, LCSWA 05/09/2018, 10:11 AM

## 2018-05-09 NOTE — Progress Notes (Addendum)
CSW followed up with [facility] referral, they have unfortunately declined the patient.  Patient has a [facility] screening on Monday, 03/02. CSW discussed this with patient, including how [facility] is a longer program (28 days instead of 21). Patient expressed understanding. Reports his family will not allow him to stay with them through the weekend.   CSW to provide patient with bus pass and shelter resources. He has stayed at [facility] in the past.   Patient observed cursing in the 300 hall dayroom after interaction. "I'm gonna be fuckin' drunk by 2pm. I don't want to go to a fucking shelter, they kick you out at 7am."  Enid Cutter, LCSW-A Clinical Social Worker

## 2018-05-09 NOTE — Discharge Summary (Signed)
Physician Discharge Summary Note  Patient:  Douglas [name] is an 53 y.o., male MRN:  [id number] DOB:  29-Jun-1965 Patient phone:  [phone] (home)  Patient address:   [address] [place] [id number],  Total Time spent with patient: 15 minutes  Date of Admission:  05/03/2018 Date of Discharge: 05/09/2018  Reason for Admission:  Suicidal ideation, alcohol abuse  Principal Problem: Alcohol use disorder, severe, dependence (HCC) Discharge Diagnoses: Principal Problem:   Alcohol use disorder, severe, dependence (HCC) Active Problems:   Major depressive disorder, recurrent severe without psychotic features (HCC)   Past Psychiatric History: Per admission H&P: Long history of depression, alcohol abuse. Denies history of hospitalizations. One prior suicide attempt two years ago. Denies history of mania.   Past Medical History:  Past Medical History:  Diagnosis Date  . Anginal pain (HCC)   . Coronary artery disease   . GERD (gastroesophageal reflux disease)   . History of acute pancreatitis 04/06/2015  . NSTEMI (non-ST elevated myocardial infarction) (HCC) 01/2014  . Pancreatitis   . PTSD (post-traumatic stress disorder)   . Tobacco abuse     Past Surgical History:  Procedure Laterality Date  . ABDOMINAL AORTOGRAM N/A 12/31/2017   Procedure: ABDOMINAL AORTOGRAM;  Surgeon: Nada LibmanBrabham, Vance W, MD;  [facility];  Service: Cardiovascular;  Laterality: N/A;  . ABDOMINAL AORTOGRAM W/LOWER EXTREMITY N/A 11/06/2017   Procedure: ABDOMINAL AORTOGRAM W/LOWER EXTREMITY;  Surgeon: Kathleene HazelMcAlhany, Christopher D, MD;  Location: [facility];  Service: Cardiovascular;  Laterality: N/A;  . CARDIAC CATHETERIZATION N/A 03/14/2016   Procedure: Left Heart Cath and Coronary Angiography;  Surgeon: Kathleene Hazelhristopher D McAlhany, MD;  [facility];  Service: Cardiovascular;  Laterality: N/A;  . CORONARY ANGIOPLASTY WITH STENT PLACEMENT  01/18/2014   "1"  . CORONARY ARTERY  BYPASS GRAFT N/A 03/19/2016   Procedure: CORONARY ARTERY BYPASS GRAFTING (CABG), ON PUMP, TIMES FIVE, USING LEFT INTERNAL MAMMARY ARTERY AND RIGHT GREATER SAPHENOUS VEIN HARVESTED ENDOSCOPICALLY;  Surgeon: Delight OvensEdward B Gerhardt, MD;  [facility];  Service: Open Heart Surgery;  Laterality: N/A;  LIMA-LAD SVG-OM SVG-DIAG SEQ SVG-PD-PL  . CORONARY BALLOON ANGIOPLASTY N/A 04/11/2017   Procedure: CORONARY BALLOON ANGIOPLASTY;  Surgeon: Yvonne KendallEnd, Christopher, MD;  [facility];  Service: Cardiovascular;  Laterality: N/A;  Distal RCA  . CORONARY STENT INTERVENTION N/A 11/06/2017   Procedure: CORONARY STENT INTERVENTION;  Surgeon: Kathleene HazelMcAlhany, Christopher D, MD;  [facility];  Service: Cardiovascular;  Laterality: N/A;  . GROIN DEBRIDEMENT Right 01/07/2018   Procedure: INCISION AND DEBRIDEMENT OF right GROIN WOUND;  Surgeon: Chuck Hintickson, Christopher S, MD;  [facility];  Service: Vascular;  Laterality: Right;  . INTRAVASCULAR PRESSURE WIRE/FFR STUDY N/A 04/11/2017   Procedure: INTRAVASCULAR PRESSURE WIRE/FFR STUDY;  Surgeon: Yvonne KendallEnd, Christopher, MD;  [facility];  Service: Cardiovascular;  Laterality: N/A;  . LEFT HEART CATH AND CORS/GRAFTS ANGIOGRAPHY N/A 04/11/2017   Procedure: LEFT HEART CATH AND CORS/GRAFTS ANGIOGRAPHY;  Surgeon: Yvonne KendallEnd, Christopher, MD;  [facility];  Service: Cardiovascular;  Laterality: N/A;  . LEFT HEART CATH AND CORS/GRAFTS ANGIOGRAPHY N/A 11/06/2017   Procedure: LEFT HEART CATH AND CORS/GRAFTS ANGIOGRAPHY;  Surgeon: Kathleene HazelMcAlhany, Christopher D, MD;  [facility];  Service: Cardiovascular;  Laterality: N/A;  . LEFT HEART CATHETERIZATION WITH CORONARY ANGIOGRAM N/A 01/18/2014   Procedure: LEFT HEART CATHETERIZATION WITH CORONARY ANGIOGRAM;  Surgeon: Runell GessJonathan J Berry, MD;  [facility];  Service: Cardiovascular;  Laterality:  N/A;  . LOWER EXTREMITY ANGIOGRAPHY Bilateral 12/31/2017   Procedure: Lower Extremity  Angiography;  Surgeon: Nada Libman, MD;  [facility];  Service: Cardiovascular;  Laterality: Bilateral;  . PERIPHERAL VASCULAR INTERVENTION Left 12/31/2017   Procedure: PERIPHERAL VASCULAR INTERVENTION;  Surgeon: Nada Libman, MD;  [facility];  Service: Cardiovascular;  Laterality: Left;  ext iliac  . TEE WITHOUT CARDIOVERSION N/A 03/19/2016   Procedure: TRANSESOPHAGEAL ECHOCARDIOGRAM (TEE);  Surgeon: Delight Ovens, MD;  Location: [facility] OR;  Service: Open Heart Surgery;  Laterality: N/A;   Family History:  Family History  Problem Relation Age of Onset  . Cancer Mother        Lung cancer  . Coronary artery disease Mother   . Heart failure Maternal Grandmother    Family Psychiatric  History: Denies Social History:  Social History   Substance and Sexual Activity  Alcohol Use Not Currently  . Alcohol/week: 1.0 standard drinks  . Types: 1 Cans of beer per week   Comment: occ     Social History   Substance and Sexual Activity  Drug Use Not Currently  . Types: Marijuana   Comment: denies 06/17/2015    Social History   Socioeconomic History  . Marital status: Divorced    Spouse name: Not on file  . Number of children: 2  . Years of education: Not on file  . Highest education level: Not on file  Occupational History  . Occupation: Statistician  Social Needs  . Financial resource strain: Not on file  . Food insecurity:    Worry: Not on file    Inability: Not on file  . Transportation needs:    Medical: Not on file    Non-medical: Not on file  Tobacco Use  . Smoking status: Current Every Day Smoker    Packs/day: 1.00    Years: 31.00    Pack years: 31.00    Types: Cigarettes  . Smokeless tobacco: Never Used  Substance and Sexual Activity  . Alcohol use: Not Currently    Alcohol/week: 1.0 standard drinks    Types: 1 Cans of beer per week    Comment: occ  . Drug use: Not Currently    Types: Marijuana    Comment: denies 06/17/2015  .  Sexual activity: Not Currently  Lifestyle  . Physical activity:    Days per week: Not on file    Minutes per session: Not on file  . Stress: Not on file  Relationships  . Social connections:    Talks on phone: Not on file    Gets together: Not on file    Attends religious service: Not on file    Active member of club or organization: Not on file    Attends meetings of clubs or organizations: Not on file    Relationship status: Not on file  Other Topics Concern  . Not on file  Social History Narrative   Divorced.  2 sons.  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    Hospital Course:  Per admission H&P 05/04/2018: Douglas [name] is a 53 year old male with history of alcohol use disorder, depression, chronic pancreatitis, HTN, HLD, peripheral vascular disease, CAD with stents and CABG in 2018, presenting voluntarily for treatment of alcohol dependence and suicidal ideation. He reports relapsing on alcohol in December 2019 after 18 months sobriety. He relapsed due to depressed mood after his father passed away. Over the last two weeks  he reports drinking 6-8 40 oz beers daily with blackouts. His last drink was on Friday. He became suicidal with plan to run into traffic and called his friend, who urged him to call 911. [facility] brought him to ED. Denies SI on assessment today. He does report history of seizures and DTs while withdrawing. Currently on scheduled Ativan CIWA protocol. He endorses nausea and diarrhea, denies vomiting. Also reports pain for last several weeks similar to pain experienced in the past with past acute pancreatitis. Lipase 41. He would like to go to rehab after discharge. Denies other drug use. UDS negative. BAL 163 on admission. Denies HI, AVH.  Douglas [name] was admitted for suicidal ideation in the context of alcohol abuse. He was started on Ativan CIWA protocol as well as Prozac. He responded well to treatment with no complications. He has history of  chronic pancreatitis and c/o abdominal pain during admission. Serum amylase and lipase were checked x2 and WNL. CT of the abdomen done 05/06/2018 and showed no acute pancreatitis, no acute abdominal findings. He expressed desire for rehab upon discharge. He told his landlord he no longer wanted to rent his room after discharge. Referrals were made to [facility] and [facility] but neither have open beds at this time. Douglas [name] remained on the [facility] unit for 7 days. He stabilized with medications and therapy. He was discharged on the medications listed below. He has shown improvement with improved mood, affect, sleep, appetite, and interaction. He denies any SI/HI/AVH and contracts for safety. He agrees to follow up at [facility] and [facility] (see below). Patient is provided with prescriptions and medication samples upon discharge. He is provided with a bus pass and shelter resources until [facility] screening 3/2.  Physical Findings: AIMS: Facial and Oral Movements Muscles of Facial Expression: None, normal Lips and Perioral Area: None, normal Jaw: None, normal Tongue: None, normal,Extremity Movements Upper (arms, wrists, hands, fingers): None, normal Lower (legs, knees, ankles, toes): None, normal, Trunk Movements Neck, shoulders, hips: None, normal, Overall Severity Severity of abnormal movements (highest score from questions above): None, normal Incapacitation due to abnormal movements: None, normal Patient's awareness of abnormal movements (rate only patient's report): No Awareness, Dental Status Current problems with teeth and/or dentures?: No Does patient usually wear dentures?: No  CIWA:  CIWA-Ar Total: 1 COWS:  COWS Total Score: 2  Musculoskeletal: Strength & Muscle Tone: within normal limits Gait & Station: normal Patient leans: N/A  Psychiatric Specialty Exam: Physical Exam  Nursing note and vitals reviewed. Constitutional: He is oriented to person, place, and time. He appears well-developed and  well-nourished.  Cardiovascular: Normal rate.  Respiratory: Effort normal.  Neurological: He is alert and oriented to person, place, and time.    Review of Systems  Constitutional: Negative.   Psychiatric/Behavioral: Positive for depression (stable on medication) and substance abuse (ETOH). Negative for hallucinations, memory loss and suicidal ideas. The patient is not nervous/anxious and does not have insomnia.     Blood pressure (!) 140/98, pulse 95, temperature (!) 97.5 F (36.4 C), temperature source Oral, resp. rate 16, height  (1.803 m), weight 68 kg, SpO2 100 %.Body mass index is 20.92 kg/m.  See MD's discharge SRA     Have you used any form of tobacco in the last 30 days? (Cigarettes, Smokeless Tobacco, Cigars, and/or Pipes): Yes  Has this patient used any form of tobacco in the last 30 days? (Cigarettes, Smokeless Tobacco, Cigars, and/or Pipes) Yes, a prescription for an FDA-approved medication for tobacco cessation  was offered at discharge.   Blood Alcohol level:  Lab Results  Component Value Date   ETH 163 (H) 05/02/2018   ETH <10 04/10/2017    Metabolic Disorder Labs:  Lab Results  Component Value Date   HGBA1C 5.5 05/05/2018   MPG 111.15 05/05/2018   MPG 122.63 08/21/2017   No results found for: PROLACTIN Lab Results  Component Value Date   CHOL 127 05/05/2018   TRIG 66 05/05/2018   HDL 45 05/05/2018   CHOLHDL 2.8 05/05/2018   VLDL 13 05/05/2018   LDLCALC 69 05/05/2018   LDLCALC 89 03/15/2016    See Psychiatric Specialty Exam and Suicide Risk Assessment completed by Attending Physician prior to discharge.  Discharge destination:  Other:  Shelter  Is patient on multiple antipsychotic therapies at discharge:  No   Has Patient had three or more failed trials of antipsychotic monotherapy by history:  No  Recommended Plan for Multiple Antipsychotic Therapies: NA  Discharge Instructions    Discharge instructions   Complete by:  As directed     Patient is instructed to take all prescribed medications as recommended. Report any side effects or adverse reactions to your outpatient psychiatrist. Patient is instructed to abstain from alcohol and illegal drugs while on prescription medications. In the event of worsening symptoms, patient is instructed to call the crisis hotline, 911, or go to the nearest emergency department for evaluation and treatment.     Allergies as of 05/09/2018      Reactions   Ibuprofen Nausea And Vomiting   UPSETS STOMACH   Shellfish Allergy Nausea And Vomiting      Medication List    STOP taking these medications   NYQUIL PO   ondansetron 4 MG tablet Commonly known as:  ZOFRAN   oxyCODONE 5 MG immediate release tablet Commonly known as:  ROXICODONE   Oxycodone HCl 10 MG Tabs     TAKE these medications     Indication  acetaminophen 325 MG tablet Commonly known as:  TYLENOL Take 2 tablets (650 mg total) by mouth every 6 (six) hours as needed for mild pain, fever or headache.  Indication:  Pain   benzocaine 10 % mucosal gel Commonly known as:  ORAJEL Use as directed in the mouth or throat 2 (two) times daily as needed for mouth pain.  Indication:  Mouth pain   carvedilol 12.5 MG tablet Commonly known as:  COREG Take 1 tablet (12.5 mg total) by mouth 2 (two) times daily with a meal. For high blood pressure What changed:  additional instructions  Indication:  High Blood Pressure Disorder   clopidogrel 75 MG tablet Commonly known as:  PLAVIX Take 1 tablet (75 mg total) by mouth daily. For antiplatelet Start taking on:  May 10, 2018 What changed:  additional instructions  Indication:  Coronary Bypass Surgery   famotidine 20 MG tablet Commonly known as:  PEPCID Take 1 tablet (20 mg total) by mouth daily. For heartburn Start taking on:  May 10, 2018 What changed:    when to take this  additional instructions  Indication:  Gastroesophageal Reflux Disease   FLUoxetine 20 MG  capsule Commonly known as:  PROZAC Take 1 capsule (20 mg total) by mouth daily. For mood Start taking on:  May 10, 2018  Indication:  Mood   hydrOXYzine 50 MG tablet Commonly known as:  ATARAX/VISTARIL Take 1 tablet (50 mg total) by mouth 3 (three) times daily as needed for itching, anxiety or nausea.  Indication:  Feeling Anxious   multivitamin with minerals Tabs tablet Take 1 tablet by mouth daily. Start taking on:  May 10, 2018  Indication:  Supplementation   nicotine 21 mg/24hr patch Commonly known as:  NICODERM CQ - dosed in mg/24 hours Place 1 patch (21 mg total) onto the skin daily. For smoking cessation Start taking on:  May 10, 2018  Indication:  Nicotine Addiction   nitroGLYCERIN 0.4 MG SL tablet Commonly known as:  NITROSTAT Place 1 tablet (0.4 mg total) under the tongue every 5 (five) minutes as needed for chest pain.  Indication:  Acute Angina Pectoris   rosuvastatin 40 MG tablet Commonly known as:  CRESTOR Take 1 tablet (40 mg total) by mouth daily at 6 PM. For high cholesterol What changed:  additional instructions  Indication:  High Amount of Fats in the Blood   traZODone 100 MG tablet Commonly known as:  DESYREL Take 1 tablet (100 mg total) by mouth at bedtime as needed for sleep.  Indication:  Trouble Sleeping      Follow-up Information    Services, [facility]. Go on 05/12/2018.   Why:  Please attend your intake appointment on 05/12/18 at 7:45am.  Contact information: [address] [place] [address] [phone]        [facility] Follow up.   Specialty:  Addiction Medicine Why:  Referral made 05/05/2018. If you wish to follow up with this referral, please call [phone]. Contact information: [address] [place] [address] [phone]           Follow-up recommendations: Activity as tolerated. Diet as recommended by primary care physician. Keep all scheduled follow-up  appointments as recommended.   Comments:   Patient is instructed to take all prescribed medications as recommended. Report any side effects or adverse reactions to your outpatient psychiatrist. Patient is instructed to abstain from alcohol and illegal drugs while on prescription medications. In the event of worsening symptoms, patient is instructed to call the crisis hotline, 911, or go to the nearest emergency department for evaluation and treatment.  Signed: Aldean Baker, NP 05/09/2018, 10:14 AM

## 2018-05-10 ENCOUNTER — Encounter (HOSPITAL_COMMUNITY): Payer: Self-pay | Admitting: Emergency Medicine

## 2018-05-10 ENCOUNTER — Emergency Department (HOSPITAL_COMMUNITY)
Admission: EM | Admit: 2018-05-10 | Discharge: 2018-05-11 | Disposition: A | Discharge status: Home / Self Care | Payer: Self-pay | Attending: Emergency Medicine | Admitting: Emergency Medicine

## 2018-05-10 ENCOUNTER — Emergency Department (HOSPITAL_COMMUNITY): Payer: Self-pay

## 2018-05-10 ENCOUNTER — Other Ambulatory Visit: Payer: Self-pay

## 2018-05-10 ENCOUNTER — Emergency Department (HOSPITAL_COMMUNITY)
Admission: EM | Admit: 2018-05-10 | Discharge: 2018-05-10 | Disposition: A | Discharge status: Home / Self Care | Payer: Self-pay | Attending: Emergency Medicine | Admitting: Emergency Medicine

## 2018-05-10 DIAGNOSIS — F10120 Alcohol abuse with intoxication, uncomplicated: Secondary | ICD-10-CM | POA: Insufficient documentation

## 2018-05-10 DIAGNOSIS — I2510 Atherosclerotic heart disease of native coronary artery without angina pectoris: Secondary | ICD-10-CM | POA: Insufficient documentation

## 2018-05-10 DIAGNOSIS — F17210 Nicotine dependence, cigarettes, uncomplicated: Secondary | ICD-10-CM | POA: Insufficient documentation

## 2018-05-10 DIAGNOSIS — R45851 Suicidal ideations: Secondary | ICD-10-CM | POA: Insufficient documentation

## 2018-05-10 DIAGNOSIS — I10 Essential (primary) hypertension: Secondary | ICD-10-CM | POA: Insufficient documentation

## 2018-05-10 DIAGNOSIS — I252 Old myocardial infarction: Secondary | ICD-10-CM | POA: Insufficient documentation

## 2018-05-10 DIAGNOSIS — Z7902 Long term (current) use of antithrombotics/antiplatelets: Secondary | ICD-10-CM | POA: Insufficient documentation

## 2018-05-10 DIAGNOSIS — F333 Major depressive disorder, recurrent, severe with psychotic symptoms: Secondary | ICD-10-CM | POA: Insufficient documentation

## 2018-05-10 DIAGNOSIS — E785 Hyperlipidemia, unspecified: Secondary | ICD-10-CM | POA: Insufficient documentation

## 2018-05-10 DIAGNOSIS — Z79899 Other long term (current) drug therapy: Secondary | ICD-10-CM | POA: Insufficient documentation

## 2018-05-10 DIAGNOSIS — R079 Chest pain, unspecified: Secondary | ICD-10-CM | POA: Insufficient documentation

## 2018-05-10 DIAGNOSIS — F1010 Alcohol abuse, uncomplicated: Secondary | ICD-10-CM

## 2018-05-10 LAB — ETHANOL
Alcohol, Ethyl (B): 15 mg/dL — ABNORMAL HIGH (ref <10)
Alcohol, Ethyl (B): <10 mg/dL (ref <10)

## 2018-05-10 LAB — I-STAT TROPONIN, ED: TROPONIN I, POC: 0.01 ng/mL (ref 0.00–0.08)

## 2018-05-10 LAB — COMPREHENSIVE METABOLIC PANEL
ALBUMIN: 3.2 g/dL — AB (ref 3.5–5.0)
ALBUMIN: 3.2 g/dL — AB (ref 3.5–5.0)
ALT: 51 U/L — ABNORMAL HIGH (ref 0–44)
ALT: 46 U/L — ABNORMAL HIGH (ref 0–44)
AST: 36 U/L (ref 15–41)
AST: 36 U/L (ref 15–41)
Alkaline Phosphatase: 110 U/L (ref 38–126)
Alkaline Phosphatase: 104 U/L (ref 38–126)
Anion gap: 8 (ref 5–15)
Anion gap: 7 (ref 5–15)
BUN: 5 mg/dL — AB (ref 6–20)
BUN: 6 mg/dL (ref 6–20)
CO2: 25 mmol/L (ref 22–32)
CO2: 26 mmol/L (ref 22–32)
Calcium: 8.7 mg/dL — ABNORMAL LOW (ref 8.9–10.3)
Calcium: 8.5 mg/dL — ABNORMAL LOW (ref 8.9–10.3)
Chloride: 105 mmol/L (ref 98–111)
Chloride: 102 mmol/L (ref 98–111)
Creatinine, Ser: 1.01 mg/dL (ref 0.61–1.24)
Creatinine, Ser: 0.95 mg/dL (ref 0.61–1.24)
GFR calc Af Amer: >60 mL/min (ref >60)
GFR calc Af Amer: >60 mL/min (ref >60)
GFR calc non Af Amer: >60 mL/min (ref >60)
GFR calc non Af Amer: >60 mL/min (ref >60)
GLUCOSE: 103 mg/dL — AB (ref 70–99)
Glucose, Bld: 129 mg/dL — ABNORMAL HIGH (ref 70–99)
Potassium: 3.8 mmol/L (ref 3.5–5.1)
Potassium: 4.3 mmol/L (ref 3.5–5.1)
SODIUM: 135 mmol/L (ref 135–145)
Sodium: 138 mmol/L (ref 135–145)
Total Bilirubin: 0.1 mg/dL — ABNORMAL LOW (ref 0.3–1.2)
Total Bilirubin: 0.5 mg/dL (ref 0.3–1.2)
Total Protein: 5.8 g/dL — ABNORMAL LOW (ref 6.5–8.1)
Total Protein: 6.2 g/dL — ABNORMAL LOW (ref 6.5–8.1)

## 2018-05-10 LAB — RAPID URINE DRUG SCREEN, HOSP PERFORMED
Amphetamines: NOT DETECTED
Barbiturates: NOT DETECTED
Benzodiazepines: NOT DETECTED
Cocaine: NOT DETECTED
Opiates: POSITIVE — AB
Tetrahydrocannabinol: NOT DETECTED

## 2018-05-10 LAB — CBC WITH DIFFERENTIAL/PLATELET
Abs Immature Granulocytes: 0.03 10*3/uL (ref 0.00–0.07)
BASOS ABS: 0.1 10*3/uL (ref 0.0–0.1)
Basophils Relative: 1 %
Eosinophils Absolute: 0.2 10*3/uL (ref 0.0–0.5)
Eosinophils Relative: 2 %
HCT: 44.4 % (ref 39.0–52.0)
Hemoglobin: 14.6 g/dL (ref 13.0–17.0)
Immature Granulocytes: 0 %
Lymphocytes Relative: 17 %
Lymphs Abs: 1.5 10*3/uL (ref 0.7–4.0)
MCH: 32.7 pg (ref 26.0–34.0)
MCHC: 32.9 g/dL (ref 30.0–36.0)
MCV: 99.6 fL (ref 80.0–100.0)
Monocytes Absolute: 1.1 10*3/uL — ABNORMAL HIGH (ref 0.1–1.0)
Monocytes Relative: 12 %
Neutro Abs: 6.2 10*3/uL (ref 1.7–7.7)
Neutrophils Relative %: 68 %
Platelets: 275 10*3/uL (ref 150–400)
RBC: 4.46 MIL/uL (ref 4.22–5.81)
RDW: 13.2 % (ref 11.5–15.5)
WBC: 9.2 10*3/uL (ref 4.0–10.5)
nRBC: 0 % (ref 0.0–0.2)

## 2018-05-10 LAB — CBC
HCT: 46 % (ref 39.0–52.0)
Hemoglobin: 14.6 g/dL (ref 13.0–17.0)
MCH: 32.4 pg (ref 26.0–34.0)
MCHC: 31.7 g/dL (ref 30.0–36.0)
MCV: 102 fL — ABNORMAL HIGH (ref 80.0–100.0)
NRBC: 0 % (ref 0.0–0.2)
Platelets: 259 10*3/uL (ref 150–400)
RBC: 4.51 MIL/uL (ref 4.22–5.81)
RDW: 13.5 % (ref 11.5–15.5)
WBC: 6.8 10*3/uL (ref 4.0–10.5)

## 2018-05-10 LAB — ACETAMINOPHEN LEVEL: Acetaminophen (Tylenol), Serum: <10 ug/mL — ABNORMAL LOW (ref 10–30)

## 2018-05-10 LAB — SALICYLATE LEVEL: Salicylate Lvl: <7 mg/dL (ref 2.8–30.0)

## 2018-05-10 MED ORDER — CLOPIDOGREL BISULFATE 75 MG PO TABS
75.0000 mg | ORAL_TABLET | Freq: Every day | ORAL | Status: DC
  Administered 2018-05-10: 75 mg via ORAL
  Filled 2018-05-10: qty 1

## 2018-05-10 MED ORDER — ACETAMINOPHEN 325 MG PO TABS
650.0000 mg | ORAL_TABLET | Freq: Once | ORAL | Status: AC
  Administered 2018-05-10: 650 mg via ORAL
  Filled 2018-05-10: qty 2

## 2018-05-10 MED ORDER — TRAZODONE HCL 50 MG PO TABS: 100.0000 mg | ORAL_TABLET | Freq: Every evening | ORAL | Status: DC | PRN

## 2018-05-10 MED ORDER — FAMOTIDINE 20 MG PO TABS: 20.0000 mg | ORAL_TABLET | Freq: Two times a day (BID) | ORAL | 0 refills | Status: AC

## 2018-05-10 MED ORDER — ALUM & MAG HYDROXIDE-SIMETH 200-200-20 MG/5ML PO SUSP
30.0000 mL | Freq: Once | ORAL | Status: AC
  Administered 2018-05-10: 30 mL via ORAL
  Filled 2018-05-10: qty 30

## 2018-05-10 MED ORDER — HYDROXYZINE HCL 25 MG PO TABS: 50.0000 mg | ORAL_TABLET | Freq: Three times a day (TID) | ORAL | Status: DC | PRN

## 2018-05-10 MED ORDER — NICOTINE 21 MG/24HR TD PT24
21.0000 mg | MEDICATED_PATCH | Freq: Every day | TRANSDERMAL | Status: DC
  Administered 2018-05-10: 21 mg via TRANSDERMAL
  Filled 2018-05-10: qty 1

## 2018-05-10 MED ORDER — CARVEDILOL 12.5 MG PO TABS
12.5000 mg | ORAL_TABLET | Freq: Two times a day (BID) | ORAL | Status: DC
  Administered 2018-05-10: 12.5 mg via ORAL
  Filled 2018-05-10: qty 1

## 2018-05-10 MED ORDER — FAMOTIDINE 20 MG PO TABS: 20.0000 mg | ORAL_TABLET | Freq: Every day | ORAL | 0 refills | Status: AC

## 2018-05-10 MED ORDER — FLUOXETINE HCL 20 MG PO CAPS
20.0000 mg | ORAL_CAPSULE | Freq: Every day | ORAL | Status: DC
  Filled 2018-05-10: qty 1

## 2018-05-10 MED ORDER — LIDOCAINE VISCOUS HCL 2 % MT SOLN
15.0000 mL | Freq: Once | OROMUCOSAL | Status: AC
  Administered 2018-05-10: 15 mL via ORAL
  Filled 2018-05-10: qty 15

## 2018-05-10 MED ORDER — ONDANSETRON HCL 4 MG/2ML IJ SOLN
4.0000 mg | Freq: Once | INTRAMUSCULAR | Status: AC
  Administered 2018-05-10: 4 mg via INTRAVENOUS
  Filled 2018-05-10: qty 2

## 2018-05-10 MED ORDER — LORAZEPAM 1 MG PO TABS: 1.0000 mg | ORAL_TABLET | Freq: Four times a day (QID) | ORAL | Status: DC | PRN

## 2018-05-10 NOTE — ED Notes (Signed)
Breakfast Tray Ordered. 

## 2018-05-10 NOTE — BH Assessment (Addendum)
Tele Assessment Note   Patient Name: Douglas [name] MRN: [id number] Referring Physician: Zadie Rhine, MD Location of Patient: [facility] Location of Provider: Behavioral Health TTS Department  Douglas [name] is an 53 y.o. male who presents to the ED voluntarily. Pt was d/c frm [facility] inpt facility on 05/10/18. Pt returns to the ED approx 6 hours after he was d/c stating he is suicidal. Pt states he has thoughts to jump off of a bridge or walk into traffic. Pt identifies his stressors as being homeless, unemployed, and alcohol abuse. Pt states he has minimal supports and states his AA sponsor is not a good AA sponsor. Pt states his sponsors name is "[name]" but he does not know how last name or telephone number. Pt states he experiences VH that are shadowy figures and he states he sees these figures daily. Pt states he feels hopeless and states if he cannot remain sober he has no reason to live. Pt states he "fought it for an hour" in regards to not drinking when he was d/c, however he reports he consumed two 40 oz beers after being D/C from [facility].   TTS asked the pt to identify his support system and he states he does not have one because his AA sponsor is not supportive. TTS asked the pt if he has any other supports that could be contacted in order to obtain collateral information. Pt states he has 2 sons but states he does not want them contacted. Pt states he has no other supports.  Per chart review, pt reported to [facility] staff that he did not want to be d/c from treatment unless he had a safe place to go aside from a homeless shelter. Pt states he has an appointment with [facility] on 05/12/18 and he wants to stay in [facility] until 05/12/18.   Per Nira Conn, NP pt is recommeded for continued observation for safety and stabilization and to be reassessed by AM psych. EDP Dr. Preston Fleeting, MD na pt's nurse Leeroy Bock, RN have been advised.  Diagnosis: MDD,r recurrent, severe, w/ pscycosis; Substance induced mood  disorder; Alcohol abuse disorder, severe   Past Medical History:  Past Medical History:  Diagnosis Date  . Anginal pain (HCC)   . Coronary artery disease   . GERD (gastroesophageal reflux disease)   . History of acute pancreatitis 04/06/2015  . NSTEMI (non-ST elevated myocardial infarction) (HCC) 01/2014  . Pancreatitis   . PTSD (post-traumatic stress disorder)   . Tobacco abuse     Past Surgical History:  Procedure Laterality Date  . ABDOMINAL AORTOGRAM N/A 12/31/2017   Procedure: ABDOMINAL AORTOGRAM;  Surgeon: Nada Libman, MD;  [facility]: [facility] INVAS[facility] CV LAB;  Service: Cardiovascular;  Laterality: N/A;  . ABDOMINAL AORTOGRAM W/LOWER EXTREMITY N/A 11/06/2017   Procedure: ABDOMINAL AORTOGRAM W/LOWER EXTREMITY;  Surgeon: Kathleene Hazel, MD;  Location: [facility];  Service: Cardiovascular;  Laterality: N/A;  . CARDIAC CATHETERIZATION N/A 03/14/2016   Procedure: Left Heart Cath and Coronary Angiography;  Surgeon: Kathleene Hazel, MD;  Location: [facility] INVASIVE CV LAB;  Service: Cardiovascular;  Laterality: N/A;  . CORONARY ANGIOPLASTY WITH STENT PLACEMENT  01/18/2014   "1"  . CORONARY ARTERY BYPASS GRAFT N/A 03/19/2016   Procedure: CORONARY ARTERY BYPASS GRAFTING (CABG), ON PUMP, TIMES FIVE, USING LEFT INTERNAL MAMMARY ARTERY AND RIGHT GREATER SAPHENOUS VEIN HARVESTED ENDOSCOPICALLY;  Surgeon: Delight Ovens, MD;  Location: [facility] OR;  Service: Open Heart Surgery;  Laterality: N/A;  LIMA-LAD SVG-OM SVG-DIAG SEQ SVG-PD-PL  .  CORONARY BALLOON ANGIOPLASTY N/A 04/11/2017   Procedure: CORONARY BALLOON ANGIOPLASTY;  Surgeon: Yvonne Kendall, MD;  [facility];  Service: Cardiovascular;  Laterality: N/A;  Distal RCA  . CORONARY STENT INTERVENTION N/A 11/06/2017   Procedure: CORONARY STENT INTERVENTION;  Surgeon: Kathleene Hazel, MD;  [facility];  Service: Cardiovascular;  Laterality: N/A;  . GROIN DEBRIDEMENT Right 01/07/2018    Procedure: INCISION AND DEBRIDEMENT OF right GROIN WOUND;  Surgeon: Chuck Hint, MD;  Location: [facility] OR;  Service: Vascular;  Laterality: Right;  . INTRAVASCULAR PRESSURE WIRE/FFR STUDY N/A 04/11/2017   Procedure: INTRAVASCULAR PRESSURE WIRE/FFR STUDY;  Surgeon: Yvonne Kendall, MD;  [facility];  Service: Cardiovascular;  Laterality: N/A;  . LEFT HEART CATH AND CORS/GRAFTS ANGIOGRAPHY N/A 04/11/2017   Procedure: LEFT HEART CATH AND CORS/GRAFTS ANGIOGRAPHY;  Surgeon: Yvonne Kendall, MD;  [facility];  Service: Cardiovascular;  Laterality: N/A;  . LEFT HEART CATH AND CORS/GRAFTS ANGIOGRAPHY N/A 11/06/2017   Procedure: LEFT HEART CATH AND CORS/GRAFTS ANGIOGRAPHY;  Surgeon: Kathleene Hazel, MD;  [facility];  Service: Cardiovascular;  Laterality: N/A;  . LEFT HEART CATHETERIZATION WITH CORONARY ANGIOGRAM N/A 01/18/2014   Procedure: LEFT HEART CATHETERIZATION WITH CORONARY ANGIOGRAM;  Surgeon: Runell Gess, MD;  Location: [facility] CATH LAB;  Service: Cardiovascular;  Laterality: N/A;  . LOWER EXTREMITY ANGIOGRAPHY Bilateral 12/31/2017   Procedure: Lower Extremity Angiography;  Surgeon: Nada Libman, MD;  [facility];  Service: Cardiovascular;  Laterality: Bilateral;  . PERIPHERAL VASCULAR INTERVENTION Left 12/31/2017   Procedure: PERIPHERAL VASCULAR INTERVENTION;  Surgeon: Nada Libman, MD;  [facility];  Service: Cardiovascular;  Laterality: Left;  ext iliac  . TEE WITHOUT CARDIOVERSION N/A 03/19/2016   Procedure: TRANSESOPHAGEAL ECHOCARDIOGRAM (TEE);  Surgeon: Delight Ovens, MD;  Location: [facility] OR;  Service: Open Heart Surgery;  Laterality: N/A;    Family History:  Family History  Problem Relation Age of Onset  . Cancer Mother        Lung cancer  . Coronary artery disease Mother   . Heart failure Maternal Grandmother     Social History:  reports that he has been smoking cigarettes. He  has a 31.00 pack-year smoking history. He has never used smokeless tobacco. He reports previous alcohol use of about 1.0 standard drinks of alcohol per week. He reports previous drug use. Drug: Marijuana.  Additional Social History:  Alcohol / Drug Use Pain Medications: See MAR Prescriptions: See MAR Over the Counter: See MAR History of alcohol / drug use?: Yes Longest period of sobriety (when/how long): 1.5 years sober.  Negative Consequences of Use: Financial, Legal, Personal relationships, Work / School Withdrawal Symptoms: Cramps, Irritability, Diarrhea Substance #1 Name of Substance 1: Alcohol.  1 - Age of First Use: 17 1 - Amount (size/oz): EXCESSIVE, UP TO 2 40 OZ BEERS 1 - Frequency: DAILY 1 - Duration: ONGOING 1 - Last Use / Amount: 05/09/18  CIWA: CIWA-Ar BP: 117/74 Pulse Rate: 82 Nausea and Vomiting: mild nausea with no vomiting Tactile Disturbances: very mild itching, pins and needles, burning or numbness Tremor: three Auditory Disturbances: not present Paroxysmal Sweats: no sweat visible Visual Disturbances: not present Anxiety: two Headache, Fullness in Head: very mild Agitation: normal activity Orientation and Clouding of Sensorium: oriented and can do serial additions CIWA-Ar Total: 8 COWS:    Allergies:  Allergies  Allergen Reactions  . Ibuprofen Nausea And Vomiting  UPSETS STOMACH  . Shellfish Allergy Nausea And Vomiting    Home Medications: (Not in a hospital admission)   OB/GYN Status:  No LMP for male patient.  General Assessment Data Location of Assessment: [facility] ED TTS Assessment: In system Is this a Tele or Face-to-Face Assessment?: Tele Assessment Is this an Initial Assessment or a Re-assessment for this encounter?: Initial Assessment Patient Accompanied by:: N/A Language Other than English: No Living Arrangements: Homeless/Shelter What gender do you identify as?: Male Marital status: Divorced Pregnancy Status: No Living Arrangements:  Alone Can pt return to current living arrangement?: Yes Admission Status: Voluntary Is patient capable of signing voluntary admission?: Yes Referral Source: Self/Family/Friend Insurance type: MCD PENDING     Crisis Care Plan Living Arrangements: Alone Name of Psychiatrist: [facility] Name of Therapist: [facility]  Education Status Is patient currently in school?: No Is the patient employed, unemployed or receiving disability?: Unemployed  Risk to self with the past 6 months Suicidal Ideation: Yes-Currently Present Has patient been a risk to self within the past 6 months prior to admission? : Yes Suicidal Intent: Yes-Currently Present Has patient had any suicidal intent within the past 6 months prior to admission? : Yes Is patient at risk for suicide?: Yes Suicidal Plan?: Yes-Currently Present Has patient had any suicidal plan within the past 6 months prior to admission? : Yes Specify Current Suicidal Plan: pt reports a plan to jump off of a bridge  Access to Means: Yes Specify Access to Suicidal Means: pt has access to bridges  What has been your use of drugs/alcohol within the last 12 months?: excessive alcohol Previous Attempts/Gestures: Yes How many times?: 2 Other Self Harm Risks: hx of suicide attempts, depression, substancce abuse, minimal ro 0 resources Triggers for Past Attempts: Other personal contacts Intentional Self Injurious Behavior: None Family Suicide History: Yes(pt states mom has attempted iin the past) Recent stressful life event(s): Financial Problems, Other (Comment)(substance abuse, homeless) Persecutory voices/beliefs?: No Depression: Yes Depression Symptoms: Despondent, Insomnia, Isolating, Guilt, Loss of interest in usual pleasures, Feeling worthless/self pity Substance abuse history and/or treatment for substance abuse?: Yes Suicide prevention information given to non-admitted patients: Not applicable  Risk to Others within the past 6 months Homicidal  Ideation: No Does patient have any lifetime risk of violence toward others beyond the six months prior to admission? : No Thoughts of Harm to Others: No Current Homicidal Intent: No Current Homicidal Plan: No Access to Homicidal Means: No History of harm to others?: No Assessment of Violence: None Noted Does patient have access to weapons?: No Criminal Charges Pending?: No Does patient have a court date: No Is patient on probation?: No  Psychosis Hallucinations: Visual(pt reports seening shadows) Delusions: None noted  Mental Status Report Appearance/Hygiene: In scrubs Eye Contact: Good Motor Activity: Freedom of movement Speech: Logical/coherent Level of Consciousness: Alert Mood: Depressed, Sad Affect: Depressed, Flat Anxiety Level: None Thought Processes: Relevant, Coherent Judgement: Impaired Orientation: Person, Appropriate for developmental age, Situation, Time, Place Obsessive Compulsive Thoughts/Behaviors: None  Cognitive Functioning Concentration: Normal Memory: Remote Intact, Recent Intact Is patient IDD: No Insight: Fair Impulse Control: Fair Appetite: Good Have you had any weight changes? : No Change Sleep: Decreased Total Hours of Sleep: 6 Vegetative Symptoms: None  ADLScreening [facility] Assessment Services) Patient's cognitive ability adequate to safely complete daily activities?: Yes Patient able to express need for assistance with ADLs?: Yes Independently performs ADLs?: Yes (appropriate for developmental age)  Prior Inpatient Therapy Prior Inpatient Therapy: Yes Prior Therapy Dates: 2020 Prior Therapy  Facilty/Provider(s): [facility] Reason for Treatment: MDD, ALCOHOL   Prior Outpatient Therapy Prior Outpatient Therapy: Yes Prior Therapy Dates: UPCOMING APPT  Prior Therapy Facilty/Provider(s): [facility]  Reason for Treatment: ALCOHOL DEPENDENCE  Does patient have an ACCT team?: No Does patient have Intensive In-House Services?  : No Does patient have  [facility] services? : No Does patient have P4CC services?: No  ADL Screening (condition at time of admission) Patient's cognitive ability adequate to safely complete daily activities?: Yes Is the patient deaf or have difficulty hearing?: No Does the patient have difficulty seeing, even when wearing glasses/contacts?: No Does the patient have difficulty concentrating, remembering, or making decisions?: No Patient able to express need for assistance with ADLs?: Yes Does the patient have difficulty dressing or bathing?: No Independently performs ADLs?: Yes (appropriate for developmental age) Does the patient have difficulty walking or climbing stairs?: No Weakness of Legs: None Weakness of Arms/Hands: None  Home Assistive Devices/Equipment Home Assistive Devices/Equipment: None    Abuse/Neglect Assessment (Assessment to be complete while patient is alone) Abuse/Neglect Assessment Can Be Completed: Yes Physical Abuse: Denies Verbal Abuse: Denies Sexual Abuse: Denies Exploitation of patient/patient's resources: Denies Self-Neglect: Denies     Merchant navy officer (For Healthcare) Does Patient Have a Medical Advance Directive?: No Would patient like information on creating a medical advance directive?: No - Patient declined          Disposition: Per Nira Conn, NP pt is recommeded for continued observation for safety and stabilization and to be reassessed by AM psych. EDP Dr. Preston Fleeting, MD na pt's nurse Leeroy Bock, RN have been advised. Disposition Initial Assessment Completed for this Encounter: Yes Disposition of Patient: (overnight OBS pending AM psych assessment) Patient refused recommended treatment: No  This service was provided via telemedicine using a 2-way, interactive audio and video technology.  Names of all persons participating in this telemedicine service and their role in this encounter. Name: Douglas [name] Role: Patient  Name: Princess Bruins Role: TTS           Karolee Ohs 05/10/2018 6:55 AM

## 2018-05-10 NOTE — ED Notes (Signed)
Pt has been wanded by security. 

## 2018-05-10 NOTE — ED Notes (Signed)
Pt belongings given to pt, pt looked through belongings signed paper. Paper given to nurse.

## 2018-05-10 NOTE — ED Provider Notes (Signed)
[facility] EMERGENCY DEPARTMENT Provider Note   CSN: [id number] Arrival date & time: 05/10/18  0247    History   Chief Complaint Chief Complaint  Patient presents with  . Suicidal  . Alcohol Intoxication    HPI Douglas [name] is a 53 y.o. male.     The history is provided by the patient.  Alcohol Intoxication  This is a new problem. The problem occurs constantly. The problem has been gradually improving. Associated symptoms include abdominal pain. Pertinent negatives include no chest pain. Nothing aggravates the symptoms. Nothing relieves the symptoms.  Patient history of CAD, GERD, alcohol abuse presents with alcohol intoxication and suicidal thoughts He reports he thinks he was released from behavioral health too soon.  He reports that since he left the hospital he went to start drinking again.  He also took some sleeping pills prior to arrival   Past Medical History:  Diagnosis Date  . Anginal pain (HCC)   . Coronary artery disease   . GERD (gastroesophageal reflux disease)   . History of acute pancreatitis 04/06/2015  . NSTEMI (non-ST elevated myocardial infarction) (HCC) 01/2014  . Pancreatitis   . PTSD (post-traumatic stress disorder)   . Tobacco abuse     Patient Active Problem List   Diagnosis Date Noted  . Alcohol use disorder, severe, dependence (HCC) 05/03/2018  . Major depressive disorder, recurrent severe without psychotic features (HCC) 05/03/2018  . Sepsis (HCC) 01/05/2018  . Cellulitis 01/04/2018  . PVD (peripheral vascular disease) (HCC) 01/04/2018  . HTN (hypertension) 01/04/2018  . HLD (hyperlipidemia) 01/04/2018  . GERD (gastroesophageal reflux disease) 01/04/2018  . Peripheral vascular disease of extremity with claudication (HCC) 11/06/2017  . Chest pain 11/06/2017  . Aortic atherosclerosis (HCC) 04/10/2017  . Patient's noncompliance with other medical treatment and regimen 04/10/2017  . CAD (coronary artery disease),  native coronary artery 03/15/2016  . Pancreatic divisum 04/18/2015  . History of acute pancreatitis 04/06/2015  . Alcohol abuse 03/09/2015  . Fatty liver 03/09/2015  . Unstable angina (HCC) 01/18/2014  . Tobacco abuse     Past Surgical History:  Procedure Laterality Date  . ABDOMINAL AORTOGRAM N/A 12/31/2017   Procedure: ABDOMINAL AORTOGRAM;  Surgeon: Nada Libman, MD;  Location: [facility] INVASIVE CV LAB;  Service: Cardiovascular;  Laterality: N/A;  . ABDOMINAL AORTOGRAM W/LOWER EXTREMITY N/A 11/06/2017   Procedure: ABDOMINAL AORTOGRAM W/LOWER EXTREMITY;  Surgeon: Kathleene Hazel, MD;  Location: [facility] INVASIVE CV LAB;  Service: Cardiovascular;  Laterality: N/A;  . CARDIAC CATHETERIZATION N/A 03/14/2016   Procedure: Left Heart Cath and Coronary Angiography;  Surgeon: Kathleene Hazel, MD;  Location: [facility] INVASIVE CV LAB;  Service: Cardiovascular;  Laterality: N/A;  . CORONARY ANGIOPLASTY WITH STENT PLACEMENT  01/18/2014   "1"  . CORONARY ARTERY BYPASS GRAFT N/A 03/19/2016   Procedure: CORONARY ARTERY BYPASS GRAFTING (CABG), ON PUMP, TIMES FIVE, USING LEFT INTERNAL MAMMARY ARTERY AND RIGHT GREATER SAPHENOUS VEIN HARVESTED ENDOSCOPICALLY;  Surgeon: Delight Ovens, MD;  Location: [facility] OR;  Service: Open Heart Surgery;  Laterality: N/A;  LIMA-LAD SVG-OM SVG-DIAG SEQ SVG-PD-PL  . CORONARY BALLOON ANGIOPLASTY N/A 04/11/2017   Procedure: CORONARY BALLOON ANGIOPLASTY;  Surgeon: Yvonne Kendall, MD;  Location: [facility] INVASIVE CV LAB;  Service: Cardiovascular;  Laterality: N/A;  Distal RCA  . CORONARY STENT INTERVENTION N/A 11/06/2017   Procedure: CORONARY STENT INTERVENTION;  Surgeon: Kathleene Hazel, MD;  Location: [facility] INVASIVE CV LAB;  Service: Cardiovascular;  Laterality: N/A;  . GROIN DEBRIDEMENT Right 01/07/2018  Procedure: INCISION AND DEBRIDEMENT OF right GROIN WOUND;  Surgeon: Chuck Hint, MD;  Location: [facility] OR;  Service: Vascular;  Laterality: Right;  . INTRAVASCULAR  PRESSURE WIRE/FFR STUDY N/A 04/11/2017   Procedure: INTRAVASCULAR PRESSURE WIRE/FFR STUDY;  Surgeon: Yvonne Kendall, MD;  Location: [facility] INVASIVE CV LAB;  Service: Cardiovascular;  Laterality: N/A;  . LEFT HEART CATH AND CORS/GRAFTS ANGIOGRAPHY N/A 04/11/2017   Procedure: LEFT HEART CATH AND CORS/GRAFTS ANGIOGRAPHY;  Surgeon: Yvonne Kendall, MD;  Location: [facility] INVASIVE CV LAB;  Service: Cardiovascular;  Laterality: N/A;  . LEFT HEART CATH AND CORS/GRAFTS ANGIOGRAPHY N/A 11/06/2017   Procedure: LEFT HEART CATH AND CORS/GRAFTS ANGIOGRAPHY;  Surgeon: Kathleene Hazel, MD;  Location: [facility] INVASIVE CV LAB;  Service: Cardiovascular;  Laterality: N/A;  . LEFT HEART CATHETERIZATION WITH CORONARY ANGIOGRAM N/A 01/18/2014   Procedure: LEFT HEART CATHETERIZATION WITH CORONARY ANGIOGRAM;  Surgeon: Runell Gess, MD;  Location: [facility] CATH LAB;  Service: Cardiovascular;  Laterality: N/A;  . LOWER EXTREMITY ANGIOGRAPHY Bilateral 12/31/2017   Procedure: Lower Extremity Angiography;  Surgeon: Nada Libman, MD;  Location: [facility] INVASIVE CV LAB;  Service: Cardiovascular;  Laterality: Bilateral;  . PERIPHERAL VASCULAR INTERVENTION Left 12/31/2017   Procedure: PERIPHERAL VASCULAR INTERVENTION;  Surgeon: Nada Libman, MD;  Location: [facility] INVASIVE CV LAB;  Service: Cardiovascular;  Laterality: Left;  ext iliac  . TEE WITHOUT CARDIOVERSION N/A 03/19/2016   Procedure: TRANSESOPHAGEAL ECHOCARDIOGRAM (TEE);  Surgeon: Delight Ovens, MD;  Location: [facility] OR;  Service: Open Heart Surgery;  Laterality: N/A;        Home Medications    Prior to Admission medications   Medication Sig Start Date End Date Taking? Authorizing Provider  acetaminophen (TYLENOL) 325 MG tablet Take 2 tablets (650 mg total) by mouth every 6 (six) hours as needed for mild pain, fever or headache. 01/10/18   Hongalgi, Maximino Greenland, MD  benzocaine (ORAJEL) 10 % mucosal gel Use as directed in the mouth or throat 2 (two) times daily as needed for mouth  pain. 05/09/18   Aldean Baker, NP  carvedilol (COREG) 12.5 MG tablet Take 1 tablet (12.5 mg total) by mouth 2 (two) times daily with a meal. For high blood pressure 05/09/18   Aldean Baker, NP  clopidogrel (PLAVIX) 75 MG tablet Take 1 tablet (75 mg total) by mouth daily. For antiplatelet 05/10/18   Aldean Baker, NP  famotidine (PEPCID) 20 MG tablet Take 1 tablet (20 mg total) by mouth 2 (two) times daily. 05/10/18   Bethann Berkshire, MD  famotidine (PEPCID) 20 MG tablet Take 1 tablet (20 mg total) by mouth daily. For heartburn 05/10/18   Bethann Berkshire, MD  FLUoxetine (PROZAC) 20 MG capsule Take 1 capsule (20 mg total) by mouth daily. For mood 05/10/18   Aldean Baker, NP  hydrOXYzine (ATARAX/VISTARIL) 50 MG tablet Take 1 tablet (50 mg total) by mouth 3 (three) times daily as needed for itching, anxiety or nausea. 05/09/18   Aldean Baker, NP  Multiple Vitamin (MULTIVITAMIN WITH MINERALS) TABS tablet Take 1 tablet by mouth daily. 05/10/18   Aldean Baker, NP  nicotine (NICODERM CQ - DOSED IN MG/24 HOURS) 21 mg/24hr patch Place 1 patch (21 mg total) onto the skin daily. For smoking cessation 05/10/18   Aldean Baker, NP  traZODone (DESYREL) 100 MG tablet Take 1 tablet (100 mg total) by mouth at bedtime as needed for sleep. 05/09/18   Aldean Baker, NP    Family History Family History  Problem Relation Age of Onset  . Cancer Mother        Lung cancer  . Coronary artery disease Mother   . Heart failure Maternal Grandmother     Social History Social History   Tobacco Use  . Smoking status: Current Every Day Smoker    Packs/day: 1.00    Years: 31.00    Pack years: 31.00    Types: Cigarettes  . Smokeless tobacco: Never Used  Substance Use Topics  . Alcohol use: Not Currently    Alcohol/week: 1.0 standard drinks    Types: 1 Cans of beer per week    Comment: occ  . Drug use: Not Currently    Types: Marijuana    Comment: denies 06/17/2015     Allergies   Ibuprofen and Shellfish  allergy   Review of Systems Review of Systems  Constitutional: Negative for fever.  Cardiovascular: Negative for chest pain.  Gastrointestinal: Positive for abdominal pain.  Psychiatric/Behavioral: Positive for suicidal ideas.  All other systems reviewed and are negative.    Physical Exam Updated Vital Signs BP 110/75   Pulse 76   Temp 98.7 F (37.1 C) (Oral)   Resp 16   Ht 1.803 m ( )   Wt 63.5 kg   SpO2 96%   BMI 19.53 kg/m   Physical Exam CONSTITUTIONAL: Disheveled, flat affect HEAD: Normocephalic/atraumatic EYES: EOMI/PERRL ENMT: Mucous membranes moist NECK: supple no meningeal signs SPINE/BACK:entire spine nontender CV: S1/S2 noted, no murmurs/rubs/gallops noted LUNGS: Lungs are clear to auscultation bilaterally, no apparent distress ABDOMEN: soft, nontender NEURO: Pt is awake/alert/appropriate, moves all extremitiesx4.  No facial droop.   EXTREMITIES: pulses normal/equal, full ROM SKIN: warm, color normal PSYCH: Flat affect   ED Treatments / Results  Labs (all labs ordered are listed, but only abnormal results are displayed) Labs Reviewed  COMPREHENSIVE METABOLIC PANEL - Abnormal; Notable for the following components:      Result Value   Glucose, Bld 129 (*)    BUN 5 (*)    Calcium 8.7 (*)    Total Protein 5.8 (*)    Albumin 3.2 (*)    ALT 51 (*)    Total Bilirubin 0.1 (*)    All other components within normal limits  ETHANOL - Abnormal; Notable for the following components:   Alcohol, Ethyl (B) 15 (*)    All other components within normal limits  ACETAMINOPHEN LEVEL - Abnormal; Notable for the following components:   Acetaminophen (Tylenol), Serum <10 (*)    All other components within normal limits  CBC - Abnormal; Notable for the following components:   MCV 102.0 (*)    All other components within normal limits  SALICYLATE LEVEL  RAPID URINE DRUG SCREEN, HOSP PERFORMED    EKG None  Radiology Dg Chest Port 1 View  Result Date:  05/09/2018 CLINICAL DATA:  Initial evaluation for acute chest pain. EXAM: PORTABLE CHEST 1 VIEW COMPARISON:  Prior radiograph from 05/02/2018. FINDINGS: Median sternotomy wires with sequelae of prior CABG. Lungs are hypoinflated with associated mild left basilar subsegmental atelectasis. No airspace consolidation, pleural effusion, or pulmonary edema is identified. There is no pneumothorax. No acute osseous abnormality. IMPRESSION: 1. Shallow lung inflation with associated mild left basilar subsegmental atelectasis. 2. No other active cardiopulmonary disease. 3. Sequelae of prior CABG. Electronically Signed   By: Rise Mu M.D.   On: 05/09/2018 22:44    Procedures Procedures   Medications Ordered in ED Medications  LORazepam (ATIVAN) tablet 1 mg (has no  administration in time range)  carvedilol (COREG) tablet 12.5 mg (has no administration in time range)  clopidogrel (PLAVIX) tablet 75 mg (has no administration in time range)  FLUoxetine (PROZAC) capsule 20 mg (has no administration in time range)  hydrOXYzine (ATARAX/VISTARIL) tablet 50 mg (has no administration in time range)  nicotine (NICODERM CQ - dosed in mg/24 hours) patch 21 mg (has no administration in time range)  traZODone (DESYREL) tablet 100 mg (has no administration in time range)     Initial Impression / Assessment and Plan / ED Course  I have reviewed the triage vital signs and the nursing notes.  Pertinent labs  results that were available during my care of the patient were reviewed by me and considered in my medical decision making (see chart for details).        4:35 AM Patient was just released from behavioral health hospital now here with alcohol intoxication and suicidal thoughts.  He actually was seen in the ER for epigastric abdominal pain from alcohol several hours ago.  He was discharged and then came back and reported SI.  He is medically stable this time.  He reports taking several OTC sleep  medications several hours ago.  Labs are reassuring, EKG reassuring.  Patient medically stable for behavioral health consult  Final Clinical Impressions(s) / ED Diagnoses   Final diagnoses:  Alcohol abuse  Suicidal ideation    ED Discharge Orders    None       Zadie Rhine, MD 05/10/18 [id number]

## 2018-05-10 NOTE — ED Notes (Signed)
TTS at bedside. 

## 2018-05-10 NOTE — ED Notes (Signed)
Pt states that he was released from behavioral health earlier today and that he asked to not be discharged but was anyway. Pt states that after he was discharged from ED earlier, he drank 2 40oz beers and took 12 "solmunex" sleeping pills. Pt states that he took the pills with the intention of harming himself.

## 2018-05-10 NOTE — ED Notes (Signed)
Dr. Bebe Shaggy advised patient is cleared medically. MD stated contacting poison control is not necessary at this time for his reported sominex ingestion at 1600.

## 2018-05-10 NOTE — ED Notes (Signed)
Belongings inventoried and placed in locker #6. Valuables sent to security with envelope [phone]

## 2018-05-10 NOTE — ED Provider Notes (Signed)
[facility] EMERGENCY DEPARTMENT Provider Note   CSN: [id number] Arrival date & time: 05/09/18  2121    History   Chief Complaint No chief complaint on file.   HPI Douglas [name] is a 53 y.o. male.     Patient complains of epigastric abdominal pain he has been drinking a lot of alcohol today.  He also has chest discomfort  The history is provided by the patient. No language interpreter was used.  Abdominal Pain  Pain location:  Epigastric Pain radiates to:  Does not radiate Pain severity:  Moderate Onset quality:  Sudden Timing:  Constant Progression:  Worsening Chronicity:  New Context: alcohol use   Relieved by:  Nothing Associated symptoms: no chest pain, no cough, no diarrhea, no fatigue and no hematuria     Past Medical History:  Diagnosis Date  . Anginal pain (HCC)   . Coronary artery disease   . GERD (gastroesophageal reflux disease)   . History of acute pancreatitis 04/06/2015  . NSTEMI (non-ST elevated myocardial infarction) (HCC) 01/2014  . Pancreatitis   . PTSD (post-traumatic stress disorder)   . Tobacco abuse     Patient Active Problem List   Diagnosis Date Noted  . Alcohol use disorder, severe, dependence (HCC) 05/03/2018  . Major depressive disorder, recurrent severe without psychotic features (HCC) 05/03/2018  . Sepsis (HCC) 01/05/2018  . Cellulitis 01/04/2018  . PVD (peripheral vascular disease) (HCC) 01/04/2018  . HTN (hypertension) 01/04/2018  . HLD (hyperlipidemia) 01/04/2018  . GERD (gastroesophageal reflux disease) 01/04/2018  . Peripheral vascular disease of extremity with claudication (HCC) 11/06/2017  . Chest pain 11/06/2017  . Aortic atherosclerosis (HCC) 04/10/2017  . Patient's noncompliance with other medical treatment and regimen 04/10/2017  . CAD (coronary artery disease), native coronary artery 03/15/2016  . Pancreatic divisum 04/18/2015  . History of acute pancreatitis 04/06/2015  . Alcohol abuse  03/09/2015  . Fatty liver 03/09/2015  . Unstable angina (HCC) 01/18/2014  . Tobacco abuse     Past Surgical History:  Procedure Laterality Date  . ABDOMINAL AORTOGRAM N/A 12/31/2017   Procedure: ABDOMINAL AORTOGRAM;  Surgeon: Nada Libman, MD;  Location: [facility] INVASIVE CV LAB;  Service: Cardiovascular;  Laterality: N/A;  . ABDOMINAL AORTOGRAM W/LOWER EXTREMITY N/A 11/06/2017   Procedure: ABDOMINAL AORTOGRAM W/LOWER EXTREMITY;  Surgeon: Kathleene Hazel, MD;  Location: [facility] INVASIVE CV LAB;  Service: Cardiovascular;  Laterality: N/A;  . CARDIAC CATHETERIZATION N/A 03/14/2016   Procedure: Left Heart Cath and Coronary Angiography;  Surgeon: Kathleene Hazel, MD;  Location: [facility] INVASIVE CV LAB;  Service: Cardiovascular;  Laterality: N/A;  . CORONARY ANGIOPLASTY WITH STENT PLACEMENT  01/18/2014   "1"  . CORONARY ARTERY BYPASS GRAFT N/A 03/19/2016   Procedure: CORONARY ARTERY BYPASS GRAFTING (CABG), ON PUMP, TIMES FIVE, USING LEFT INTERNAL MAMMARY ARTERY AND RIGHT GREATER SAPHENOUS VEIN HARVESTED ENDOSCOPICALLY;  Surgeon: Delight Ovens, MD;  Location: [facility] OR;  Service: Open Heart Surgery;  Laterality: N/A;  LIMA-LAD SVG-OM SVG-DIAG SEQ SVG-PD-PL  . CORONARY BALLOON ANGIOPLASTY N/A 04/11/2017   Procedure: CORONARY BALLOON ANGIOPLASTY;  Surgeon: Yvonne Kendall, MD;  Location: [facility] INVASIVE CV LAB;  Service: Cardiovascular;  Laterality: N/A;  Distal RCA  . CORONARY STENT INTERVENTION N/A 11/06/2017   Procedure: CORONARY STENT INTERVENTION;  Surgeon: Kathleene Hazel, MD;  Location: [facility] INVASIVE CV LAB;  Service: Cardiovascular;  Laterality: N/A;  . GROIN DEBRIDEMENT Right 01/07/2018   Procedure: INCISION AND DEBRIDEMENT OF right GROIN WOUND;  Surgeon: Chuck Hint, MD;  Location: [facility] OR;  Service: Vascular;  Laterality: Right;  . INTRAVASCULAR PRESSURE WIRE/FFR STUDY N/A 04/11/2017   Procedure: INTRAVASCULAR PRESSURE WIRE/FFR STUDY;  Surgeon: Yvonne Kendall, MD;  Location: [facility]  INVASIVE CV LAB;  Service: Cardiovascular;  Laterality: N/A;  . LEFT HEART CATH AND CORS/GRAFTS ANGIOGRAPHY N/A 04/11/2017   Procedure: LEFT HEART CATH AND CORS/GRAFTS ANGIOGRAPHY;  Surgeon: Yvonne Kendall, MD;  Location: [facility] INVASIVE CV LAB;  Service: Cardiovascular;  Laterality: N/A;  . LEFT HEART CATH AND CORS/GRAFTS ANGIOGRAPHY N/A 11/06/2017   Procedure: LEFT HEART CATH AND CORS/GRAFTS ANGIOGRAPHY;  Surgeon: Kathleene Hazel, MD;  Location: [facility] INVASIVE CV LAB;  Service: Cardiovascular;  Laterality: N/A;  . LEFT HEART CATHETERIZATION WITH CORONARY ANGIOGRAM N/A 01/18/2014   Procedure: LEFT HEART CATHETERIZATION WITH CORONARY ANGIOGRAM;  Surgeon: Runell Gess, MD;  Location: [facility] CATH LAB;  Service: Cardiovascular;  Laterality: N/A;  . LOWER EXTREMITY ANGIOGRAPHY Bilateral 12/31/2017   Procedure: Lower Extremity Angiography;  Surgeon: Nada Libman, MD;  Location: [facility] INVASIVE CV LAB;  Service: Cardiovascular;  Laterality: Bilateral;  . PERIPHERAL VASCULAR INTERVENTION Left 12/31/2017   Procedure: PERIPHERAL VASCULAR INTERVENTION;  Surgeon: Nada Libman, MD;  Location: [facility] INVASIVE CV LAB;  Service: Cardiovascular;  Laterality: Left;  ext iliac  . TEE WITHOUT CARDIOVERSION N/A 03/19/2016   Procedure: TRANSESOPHAGEAL ECHOCARDIOGRAM (TEE);  Surgeon: Delight Ovens, MD;  Location: [facility] OR;  Service: Open Heart Surgery;  Laterality: N/A;        Home Medications    Prior to Admission medications   Medication Sig Start Date End Date Taking? Authorizing Provider  acetaminophen (TYLENOL) 325 MG tablet Take 2 tablets (650 mg total) by mouth every 6 (six) hours as needed for mild pain, fever or headache. 01/10/18   Hongalgi, Maximino Greenland, MD  benzocaine (ORAJEL) 10 % mucosal gel Use as directed in the mouth or throat 2 (two) times daily as needed for mouth pain. 05/09/18   Aldean Baker, NP  carvedilol (COREG) 12.5 MG tablet Take 1 tablet (12.5 mg total) by mouth 2 (two) times daily with a  meal. For high blood pressure 05/09/18   Aldean Baker, NP  clopidogrel (PLAVIX) 75 MG tablet Take 1 tablet (75 mg total) by mouth daily. For antiplatelet 05/10/18   Aldean Baker, NP  famotidine (PEPCID) 20 MG tablet Take 1 tablet (20 mg total) by mouth 2 (two) times daily. 05/10/18   Bethann Berkshire, MD  famotidine (PEPCID) 20 MG tablet Take 1 tablet (20 mg total) by mouth daily. For heartburn 05/10/18   Bethann Berkshire, MD  FLUoxetine (PROZAC) 20 MG capsule Take 1 capsule (20 mg total) by mouth daily. For mood 05/10/18   Aldean Baker, NP  hydrOXYzine (ATARAX/VISTARIL) 50 MG tablet Take 1 tablet (50 mg total) by mouth 3 (three) times daily as needed for itching, anxiety or nausea. 05/09/18   Aldean Baker, NP  Multiple Vitamin (MULTIVITAMIN WITH MINERALS) TABS tablet Take 1 tablet by mouth daily. 05/10/18   Aldean Baker, NP  nicotine (NICODERM CQ - DOSED IN MG/24 HOURS) 21 mg/24hr patch Place 1 patch (21 mg total) onto the skin daily. For smoking cessation 05/10/18   Aldean Baker, NP  traZODone (DESYREL) 100 MG tablet Take 1 tablet (100 mg total) by mouth at bedtime as needed for sleep. 05/09/18   Aldean Baker, NP    Family History Family History  Problem Relation Age of Onset  . Cancer Mother  Lung cancer  . Coronary artery disease Mother   . Heart failure Maternal Grandmother     Social History Social History   Tobacco Use  . Smoking status: Current Every Day Smoker    Packs/day: 1.00    Years: 31.00    Pack years: 31.00    Types: Cigarettes  . Smokeless tobacco: Never Used  Substance Use Topics  . Alcohol use: Not Currently    Alcohol/week: 1.0 standard drinks    Types: 1 Cans of beer per week    Comment: occ  . Drug use: Not Currently    Types: Marijuana    Comment: denies 06/17/2015     Allergies   Ibuprofen and Shellfish allergy   Review of Systems Review of Systems  Constitutional: Negative for appetite change and fatigue.  HENT: Negative for congestion,  ear discharge and sinus pressure.   Eyes: Negative for discharge.  Respiratory: Negative for cough.   Cardiovascular: Negative for chest pain.  Gastrointestinal: Positive for abdominal pain. Negative for diarrhea.  Genitourinary: Negative for frequency and hematuria.  Musculoskeletal: Negative for back pain.  Skin: Negative for rash.  Neurological: Negative for seizures and headaches.  Psychiatric/Behavioral: Negative for hallucinations.     Physical Exam Updated Vital Signs BP 124/81   Pulse 76   Temp 98.3 F (36.8 C) (Oral)   Resp 13   Ht 5\' 11"  (1.803 m)   Wt 63.5 kg   SpO2 97%   BMI 19.53 kg/m   Physical Exam Vitals signs and nursing note reviewed.  Constitutional:      Appearance: He is well-developed.  HENT:     Head: Normocephalic.     Nose: Nose normal.  Eyes:     General: No scleral icterus.    Conjunctiva/sclera: Conjunctivae normal.  Neck:     Musculoskeletal: Neck supple.     Thyroid: No thyromegaly.  Cardiovascular:     Rate and Rhythm: Normal rate and regular rhythm.     Heart sounds: No murmur. No friction rub. No gallop.   Pulmonary:     Breath sounds: No stridor. No wheezing or rales.  Chest:     Chest wall: No tenderness.  Abdominal:     General: There is no distension.     Tenderness: There is abdominal tenderness. There is no rebound.  Musculoskeletal: Normal range of motion.  Lymphadenopathy:     Cervical: No cervical adenopathy.  Skin:    Findings: No erythema or rash.  Neurological:     Mental Status: He is oriented to person, place, and time.     Motor: No abnormal muscle tone.     Coordination: Coordination normal.  Psychiatric:        Behavior: Behavior normal.      ED Treatments / Results  Labs (all labs ordered are listed, but only abnormal results are displayed) Labs Reviewed  COMPREHENSIVE METABOLIC PANEL - Abnormal; Notable for the following components:      Result Value   Glucose, Bld 103 (*)    BUN 5 (*)     Calcium 8.5 (*)    Total Protein 5.8 (*)    Albumin 3.0 (*)    AST 45 (*)    ALT 52 (*)    All other components within normal limits  CBC WITH DIFFERENTIAL/PLATELET - Abnormal; Notable for the following components:   MCV 101.8 (*)    All other components within normal limits  CBC WITH DIFFERENTIAL/PLATELET  I-STAT TROPONIN, ED    EKG  EKG Interpretation  Date/Time:  Friday May 09 2018 21:22:06 EST Ventricular Rate:  88 PR Interval:    QRS Duration: 88 QT Interval:  392 QTC Calculation: 475 R Axis:   59 Text Interpretation:  Sinus rhythm Probable left atrial enlargement Baseline wander in lead(s) V1 Confirmed by Bethann Berkshire [phone]) on 05/09/2018 9:26:17 PM   Radiology Dg Chest Port 1 View  Result Date: 05/09/2018 CLINICAL DATA:  Initial evaluation for acute chest pain. EXAM: PORTABLE CHEST 1 VIEW COMPARISON:  Prior radiograph from 05/02/2018. FINDINGS: Median sternotomy wires with sequelae of prior CABG. Lungs are hypoinflated with associated mild left basilar subsegmental atelectasis. No airspace consolidation, pleural effusion, or pulmonary edema is identified. There is no pneumothorax. No acute osseous abnormality. IMPRESSION: 1. Shallow lung inflation with associated mild left basilar subsegmental atelectasis. 2. No other active cardiopulmonary disease. 3. Sequelae of prior CABG. Electronically Signed   By: Rise Mu M.D.   On: 05/09/2018 22:44    Procedures Procedures (including critical care time)  Medications Ordered in ED Medications  alum & mag hydroxide-simeth (MAALOX/MYLANTA) 200-200-20 MG/5ML suspension 30 mL (30 mLs Oral Given 05/09/18 2228)    And  lidocaine (XYLOCAINE) 2 % viscous mouth solution 15 mL (15 mLs Oral Given 05/09/18 2228)  morphine 2 MG/ML injection 2 mg (2 mg Intravenous Given 05/09/18 2228)     Initial Impression / Assessment and Plan / ED Course  I have reviewed the triage vital signs and the nursing notes.  Pertinent labs &  imaging results that were available during my care of the patient were reviewed by me and considered in my medical decision making (see chart for details).        Patient improved with GI cocktail and nausea medicine.  Labs unremarkable.  Suspect gastritis.  Patient will start Pepcid and follow-up with PCP  Final Clinical Impressions(s) / ED Diagnoses   Final diagnoses:  Acute superficial gastritis without hemorrhage    ED Discharge Orders         Ordered    famotidine (PEPCID) 20 MG tablet  2 times daily     05/10/18 0007    famotidine (PEPCID) 20 MG tablet  Daily     05/10/18 0007           Bethann Berkshire, MD 05/10/18 0009

## 2018-05-10 NOTE — Discharge Instructions (Addendum)
Stop drinking alcohol and follow-up with your doctor next week

## 2018-05-10 NOTE — ED Notes (Signed)
ED Provider at bedside. 

## 2018-05-10 NOTE — ED Provider Notes (Addendum)
[facility] Provider Note   CSN: [id number] Arrival date & time: 05/10/18  2246    History   Chief Complaint Chief Complaint  Patient presents with  . Chest Pain    HPI Douglas [name] is a 53 y.o. male.   The history is provided by the patient.  He has history of coronary artery disease, GERD, pancreatitis, depression, alcohol abuse, hypertension and comes in because of an episode of chest pain.  He states that he was walking fast to get to a friend's house to spend the night when he developed sharp midsternal chest pain which radiated to the left arm into the interscapular area.  He rated pain at 9/10.  There was slight relief with nitroglycerin bring pain down to 8/10.  There is associated dyspnea, nausea, diaphoresis.  He was brought in by ambulance, who gave him nitroglycerin and aspirin.  He had just been [facility] with depression and alcohol abuse.  Since then, he has had 2 ED visits-1 4 epigastric pain and 1 for depression.  Admits to drinking a 12 ounce beer following his most recent ED discharge.  He is scheduled to follow-up with [facility] in 2 days.  Additional cardiac risk factors include tobacco abuse and family history of premature coronary atherosclerosis.  Past Medical History:  Diagnosis Date  . Anginal pain (HCC)   . Coronary artery disease   . GERD (gastroesophageal reflux disease)   . History of acute pancreatitis 04/06/2015  . NSTEMI (non-ST elevated myocardial infarction) (HCC) 01/2014  . Pancreatitis   . PTSD (post-traumatic stress disorder)   . Tobacco abuse     Patient Active Problem List   Diagnosis Date Noted  . Alcohol use disorder, severe, dependence (HCC) 05/03/2018  . Major depressive disorder, recurrent severe without psychotic features (HCC) 05/03/2018  . Sepsis (HCC) 01/05/2018  . Cellulitis 01/04/2018  . PVD (peripheral vascular disease) (HCC) 01/04/2018  .  HTN (hypertension) 01/04/2018  . HLD (hyperlipidemia) 01/04/2018  . GERD (gastroesophageal reflux disease) 01/04/2018  . Peripheral vascular disease of extremity with claudication (HCC) 11/06/2017  . Chest pain 11/06/2017  . Aortic atherosclerosis (HCC) 04/10/2017  . Patient's noncompliance with other medical treatment and regimen 04/10/2017  . CAD (coronary artery disease), native coronary artery 03/15/2016  . Pancreatic divisum 04/18/2015  . History of acute pancreatitis 04/06/2015  . Alcohol abuse 03/09/2015  . Fatty liver 03/09/2015  . Unstable angina (HCC) 01/18/2014  . Tobacco abuse     Past Surgical History:  Procedure Laterality Date  . ABDOMINAL AORTOGRAM N/A 12/31/2017   Procedure: ABDOMINAL AORTOGRAM;  Surgeon: Nada LibmanBrabham, Vance W, MD;  Location: [facility] IN[facility];  Service: Cardiovascular;  Laterality: N/A;  . ABDOMINAL AORTOGRAM W/LOWER EXTREMITY N/A 11/06/2017   Procedure: ABDOMINAL AORTOGRAM W/LOWER EXTREMITY;  Surgeon: Kathleene HazelMcAlhany, Christopher D, MD;  Location: [facility];  Service: Cardiovascular;  Laterality: N/A;  . CARDIAC CATHETERIZATION N/A 03/14/2016   Procedure: Left Heart Cath and Coronary Angiography;  Surgeon: Kathleene Hazelhristopher D McAlhany, MD;  [facility];  Service: Cardiovascular;  Laterality: N/A;  . CORONARY ANGIOPLASTY WITH STENT PLACEMENT  01/18/2014   "1"  . CORONARY ARTERY BYPASS GRAFT N/A 03/19/2016   Procedure: CORONARY ARTERY BYPASS GRAFTING (CABG), ON PUMP, TIMES FIVE, USING LEFT INTERNAL MAMMARY ARTERY AND RIGHT GREATER SAPHENOUS VEIN HARVESTED ENDOSCOPICALLY;  Surgeon: Delight OvensEdward B Gerhardt, MD;  [facility];  Service: Open Heart Surgery;  Laterality: N/A;  LIMA-LAD SVG-OM SVG-DIAG  SEQ SVG-PD-PL  . CORONARY BALLOON ANGIOPLASTY N/A 04/11/2017   Procedure: CORONARY BALLOON ANGIOPLASTY;  Surgeon: Yvonne Kendall, MD;  Location: [facility];  Service: Cardiovascular;  Laterality: N/A;  Distal RCA  . CORONARY STENT INTERVENTION N/A  11/06/2017   Procedure: CORONARY STENT INTERVENTION;  Surgeon: Kathleene Hazel, MD;  Location: [facility];  Service: Cardiovascular;  Laterality: N/A;  . GROIN DEBRIDEMENT Right 01/07/2018   Procedure: INCISION AND DEBRIDEMENT OF right GROIN WOUND;  Surgeon: Chuck Hint, MD;  Location: [facility] OR;  Service: Vascular;  Laterality: Right;  . INTRAVASCULAR PRESSURE WIRE/FFR STUDY N/A 04/11/2017   Procedure: INTRAVASCULAR PRESSURE WIRE/FFR STUDY;  Surgeon: Yvonne Kendall, MD;  [facility]: [facility];  Service: Cardiovascular;  Laterality: N/A;  . LEFT HEART CATH AND CORS/GRAFTS ANGIOGRAPHY N/A 04/11/2017   Procedure: LEFT HEART CATH AND CORS/GRAFTS ANGIOGRAPHY;  Surgeon: Yvonne Kendall, MD;  [facility]: [facility];  Service: Cardiovascular;  Laterality: N/A;  . LEFT HEART CATH AND CORS/GRAFTS ANGIOGRAPHY N/A 11/06/2017   Procedure: LEFT HEART CATH AND CORS/GRAFTS ANGIOGRAPHY;  Surgeon: Kathleene Hazel, MD;  [facility]: [facility];  Service: Cardiovascular;  Laterality: N/A;  . LEFT HEART CATHETERIZATION WITH CORONARY ANGIOGRAM N/A 01/18/2014   Procedure: LEFT HEART CATHETERIZATION WITH CORONARY ANGIOGRAM;  Surgeon: Runell Gess, MD;  Location: [facility] CATH LAB;  Service: Cardiovascular;  Laterality: N/A;  . LOWER EXTREMITY ANGIOGRAPHY Bilateral 12/31/2017   Procedure: Lower Extremity Angiography;  Surgeon: Nada Libman, MD;  [facility]: [facility];  Service: Cardiovascular;  Laterality: Bilateral;  . PERIPHERAL VASCULAR INTERVENTION Left 12/31/2017   Procedure: PERIPHERAL VASCULAR INTERVENTION;  Surgeon: Nada Libman, MD;  [facility];  Service: Cardiovascular;  Laterality: Left;  ext iliac  . TEE WITHOUT CARDIOVERSION N/A 03/19/2016   Procedure: TRANSESOPHAGEAL ECHOCARDIOGRAM (TEE);  Surgeon: Delight Ovens, MD;  Location: [facility] OR;  Service: Open Heart Surgery;  Laterality: N/A;        Home Medications    Prior to  Admission medications   Medication Sig Start Date End Date Taking? Authorizing Provider  acetaminophen (TYLENOL) 325 MG tablet Take 2 tablets (650 mg total) by mouth every 6 (six) hours as needed for mild pain, fever or headache. 01/10/18   Hongalgi, Maximino Greenland, MD  benzocaine (ORAJEL) 10 % mucosal gel Use as directed in the mouth or throat 2 (two) times daily as needed for mouth pain. 05/09/18   Aldean Baker, NP  carvedilol (COREG) 12.5 MG tablet Take 1 tablet (12.5 mg total) by mouth 2 (two) times daily with a meal. For high blood pressure 05/09/18   Aldean Baker, NP  clopidogrel (PLAVIX) 75 MG tablet Take 1 tablet (75 mg total) by mouth daily. For antiplatelet 05/10/18   Aldean Baker, NP  famotidine (PEPCID) 20 MG tablet Take 1 tablet (20 mg total) by mouth 2 (two) times daily. 05/10/18   Bethann Berkshire, MD  famotidine (PEPCID) 20 MG tablet Take 1 tablet (20 mg total) by mouth daily. For heartburn 05/10/18   Bethann Berkshire, MD  FLUoxetine (PROZAC) 20 MG capsule Take 1 capsule (20 mg total) by mouth daily. For mood 05/10/18   Aldean Baker, NP  hydrOXYzine (ATARAX/VISTARIL) 50 MG tablet Take 1 tablet (50 mg total) by mouth 3 (three) times daily as needed for itching, anxiety or nausea. 05/09/18   Aldean Baker, NP  Multiple Vitamin (MULTIVITAMIN WITH MINERALS) TABS tablet Take 1 tablet by mouth  daily. 05/10/18   Aldean Baker, NP  nicotine (NICODERM CQ - DOSED IN MG/24 HOURS) 21 mg/24hr patch Place 1 patch (21 mg total) onto the skin daily. For smoking cessation 05/10/18   Aldean Baker, NP  traZODone (DESYREL) 100 MG tablet Take 1 tablet (100 mg total) by mouth at bedtime as needed for sleep. 05/09/18   Aldean Baker, NP    Family History Family History  Problem Relation Age of Onset  . Cancer Mother        Lung cancer  . Coronary artery disease Mother   . Heart failure Maternal Grandmother     Social History Social History   Tobacco Use  . Smoking status: Current Every Day Smoker     Packs/day: 1.00    Years: 31.00    Pack years: 31.00    Types: Cigarettes  . Smokeless tobacco: Never Used  Substance Use Topics  . Alcohol use: Not Currently    Alcohol/week: 1.0 standard drinks    Types: 1 Cans of beer per week    Comment: occ  . Drug use: Not Currently    Types: Marijuana    Comment: denies 06/17/2015     Allergies   Ibuprofen and Shellfish allergy   Review of Systems Review of Systems  All other systems reviewed and are negative.    Physical Exam Updated Vital Signs BP (!) 146/96 (BP Location: Right Arm)   Pulse 87   Temp 98.4 F (36.9 C) (Oral)   Resp 12   Ht 5\' 11"  (1.803 m)   Wt 63.5 kg   SpO2 98%   BMI 19.52 kg/m   Physical Exam Vitals signs and nursing note reviewed.    53 year old male, resting comfortably and in no acute distress. Vital signs are significant for elevated blood pressure. Oxygen saturation is 98%, which is normal. Head is normocephalic and atraumatic. PERRLA, EOMI. Oropharynx is clear. Neck is nontender and supple without adenopathy or JVD. Back is nontender and there is no CVA tenderness. Lungs are clear without rales, wheezes, or rhonchi. Chest is nontender. Heart has regular rate and rhythm without murmur. Abdomen is soft, flat, with mild epigastric and right upper quadrant tenderness.  There is no rebound or guarding.  There are no masses or hepatosplenomegaly and peristalsis is normoactive. Extremities have no cyanosis or edema, full range of motion is present. Skin is warm and dry without rash. Neurologic: Mental status is normal, cranial nerves are intact, there are no motor or sensory deficits.  ED Treatments / Results  Labs (all labs ordered are listed, but only abnormal results are displayed) Labs Reviewed  CBC WITH DIFFERENTIAL/PLATELET - Abnormal; Notable for the following components:      Result Value   Monocytes Absolute 1.1 (*)    All other components within normal limits  COMPREHENSIVE METABOLIC  PANEL - Abnormal; Notable for the following components:   Glucose, Bld 103 (*)    Calcium 8.5 (*)    Total Protein 6.2 (*)    Albumin 3.2 (*)    ALT 46 (*)    All other components within normal limits  ETHANOL  TROPONIN I  I-STAT TROPONIN, ED    EKG JOSEGUADALUPE, BISHARA XT:[id number] 10-May-2018 22:59:37 [facility]/ED ROUTINE RECORD Sinus rhythm Normal ECG When compared with ECG of EARLIER SAME DATE No significant change was found Confirmed by Dione Booze ([id number]) on 05/10/2018 11:03:48 PM 36mm/s 60mm/mV 100Hz  9.0.4 CID: [id number] Confirmed By: Dione Booze Vent. rate  86 BPM PR interval * ms QRS duration 89 ms QT/QTc 389/466 ms P-R-T axes 60 66 79 1966/01/27 (52 yr) Male Caucasian Room:ED021 Loc:11 Technician: [id number] Test ind:  Radiology Dg Chest Port 1 View  Result Date: 05/09/2018 CLINICAL DATA:  Initial evaluation for acute chest pain. EXAM: PORTABLE CHEST 1 VIEW COMPARISON:  Prior radiograph from 05/02/2018. FINDINGS: Median sternotomy wires with sequelae of prior CABG. Lungs are hypoinflated with associated mild left basilar subsegmental atelectasis. No airspace consolidation, pleural effusion, or pulmonary edema is identified. There is no pneumothorax. No acute osseous abnormality. IMPRESSION: 1. Shallow lung inflation with associated mild left basilar subsegmental atelectasis. 2. No other active cardiopulmonary disease. 3. Sequelae of prior CABG. Electronically Signed   By: Rise Mu M.D.   On: 05/09/2018 22:44    Procedures Procedures   Medications Ordered in ED Medications  acetaminophen (TYLENOL) tablet 650 mg (650 mg Oral Given 05/10/18 2356)  alum & mag hydroxide-simeth (MAALOX/MYLANTA) 200-200-20 MG/5ML suspension 30 mL (30 mLs Oral Given 05/10/18 2356)    And  lidocaine (XYLOCAINE) 2 % viscous mouth solution 15 mL (15 mLs Oral Given 05/10/18 2356)     Initial Impression / Assessment and Plan / ED Course  I have reviewed the triage vital  signs and the nursing notes.  Pertinent labs & imaging results that were available during my care of the patient were reviewed by me and considered in my medical decision making (see chart for details).  Chest pain and patient with known history of coronary disease.  However, this is his third ED visit since discharge from behavioral [facility] yesterday, and I suspect he is more worried than anything else.  ECG is completely normal.  Will check troponin and delta troponin.  Troponin and delta troponin are both normal.  Patient has been resting quietly in the ED, and has fallen asleep.  Further review of past records does show cardiac catheterization November 06, 2017 which showed severe triple-vessel disease, 1 out of 4 bypass grafts patent and treated with drug-eluting stent to the right coronary artery.  Patent graft gave blood supply to the left anterior descending artery.  This did leave circumflex artery with myocardium at risk.  With unremarkable ECG and 2- troponins, he is felt to be safe for discharge.  He is referred to cardiology for consideration for noninvasive testing such as nuclear stress test or stress echocardiogram.  Return precautions discussed.  Final Clinical Impressions(s) / ED Diagnoses   Final diagnoses:  Chest pain, unspecified type    ED Discharge Orders    None       Dione Booze, MD 05/11/18 [id number]    Dione Booze, MD 05/11/18 [phone]

## 2018-05-10 NOTE — ED Provider Notes (Signed)
Patient care assumed at 0800.  Pt with hx/o alcohol abuse here with SI.  He has been evaluated by psych and cleared for d/c with outpatient [facility] follow up on Monday.  Pt is clinically sober on evaluation, awake and alert with no evidence of EtOH withdrawal.  Plan to d/c with outpatient resources.     Tilden Fossa, MD 05/10/18 1036

## 2018-05-10 NOTE — ED Triage Notes (Signed)
Pt presents with SI, denies plan. Normally drinks 4-6 40 oz beers a day. Last drink yesterday.

## 2018-05-10 NOTE — ED Notes (Signed)
Pt requesting pain medication. MD made aware. No new orders at this time.

## 2018-05-10 NOTE — Progress Notes (Signed)
Patient is seen by me via tele-psych and have consulted with Dr. Lucianne Muss.  Patient denies any suicidal or homicidal ideations and denies any hallucinations.  Patient reports that yes he did discharge from the hospital yesterday but he reports that no one gave him assistance with finding a shelter while he was waiting for placement into the [facility] on Monday morning.  However, it is well-documented over several days that the patient became anxious about discharge and was refusing to go to shelters and cursing about being kicked out of shelters at 7:00 in the morning and that patient was provided numerous options while he was waiting for his appointment on Monday morning.  However patient did not seek any other options other than shelters.  Since patient's discharged yesterday he did return to the ED twice once reporting abdominal pain and the second time reporting substance abuse as well as suicidal ideations.  Today patient is lying in the bed and is cooperative, calm, and pleasant.  Patient actually requested to be admitted for at least 1 day for him to make it through another night so that he can go to [facility] on Monday morning and this was denied.  Patient feels that he should be allowed to stay in the hospital until he goes to his residential treatment because he is homeless.  Patient is informed that due to temperatures that getting into a shelter will be easier than normal and he should at least have a place to sleep through the night even though he will be required to leave the next morning.  Patient continues to say that he does not want to mess up the deal with [facility] and he is informed to do his best to find shelter locally to ensure that he makes his appointment at [facility] on Monday.  At this time patient does not meet inpatient criteria and is psychiatrically cleared.  I have contacted Dr. Madilyn Hook and notified her of the recommendations.

## 2018-05-10 NOTE — ED Notes (Signed)
Patient transported to X-ray 

## 2018-05-10 NOTE — ED Notes (Signed)
Nurse will draw labs. 

## 2018-05-10 NOTE — ED Notes (Signed)
Patient verbalized understanding of dc instructions, vss, ambulatory with nad.   

## 2018-05-10 NOTE — ED Triage Notes (Signed)
BIB [facility] with c/o of chest pain radiating down left arm starting at 2100. Pt scheduled for detox @ [facility] on Monday. Reports 1 beer today. Also reports coughing up yellow mucus X 2-3 days. Pt took 3 nitro before ems and received 4 nitro, 324 asp with ems.

## 2018-05-10 NOTE — ED Notes (Signed)
Patient verbalizes understanding of discharge instructions. Opportunity for questioning and answers were provided. Armband removed by staff, pt discharged from ED ambulatory.   

## 2018-05-10 NOTE — ED Notes (Signed)
Douglas [name] from patient placement called to advise they are recommending psychiatry evaluation in the morning. She stated she would contact Dr. Bebe Shaggy to let him know as well.

## 2018-05-10 NOTE — Progress Notes (Addendum)
Per Nira Conn, NP pt is recommeded for continued observation for safety and stabilization and to be reassessed by AM psych. EDP Dr. Preston Fleeting, MD na pt's nurse Leeroy Bock, RN have been advised.  Princess Bruins, MSW, LCSW Therapeutic Triage Specialist  [phone]

## 2018-05-11 ENCOUNTER — Other Ambulatory Visit: Payer: Self-pay

## 2018-05-11 ENCOUNTER — Emergency Department (HOSPITAL_COMMUNITY)
Admission: EM | Admit: 2018-05-11 | Discharge: 2018-05-11 | Disposition: A | Discharge status: Home / Self Care | Payer: Self-pay | Attending: Emergency Medicine | Admitting: Emergency Medicine

## 2018-05-11 ENCOUNTER — Encounter (HOSPITAL_COMMUNITY): Payer: Self-pay | Admitting: Emergency Medicine

## 2018-05-11 DIAGNOSIS — Z7902 Long term (current) use of antithrombotics/antiplatelets: Secondary | ICD-10-CM | POA: Insufficient documentation

## 2018-05-11 DIAGNOSIS — K0889 Other specified disorders of teeth and supporting structures: Secondary | ICD-10-CM | POA: Insufficient documentation

## 2018-05-11 DIAGNOSIS — F17210 Nicotine dependence, cigarettes, uncomplicated: Secondary | ICD-10-CM | POA: Insufficient documentation

## 2018-05-11 DIAGNOSIS — I259 Chronic ischemic heart disease, unspecified: Secondary | ICD-10-CM | POA: Insufficient documentation

## 2018-05-11 DIAGNOSIS — I10 Essential (primary) hypertension: Secondary | ICD-10-CM | POA: Insufficient documentation

## 2018-05-11 DIAGNOSIS — Z79899 Other long term (current) drug therapy: Secondary | ICD-10-CM | POA: Insufficient documentation

## 2018-05-11 LAB — TROPONIN I: Troponin I: <0.03 ng/mL (ref <0.03)

## 2018-05-11 MED ORDER — PENICILLIN V POTASSIUM 500 MG PO TABS: 500.0000 mg | ORAL_TABLET | Freq: Four times a day (QID) | ORAL | 0 refills | Status: AC

## 2018-05-11 NOTE — ED Triage Notes (Signed)
Pt c/o left side dental abscess that started this morning

## 2018-05-11 NOTE — ED Notes (Signed)
Patient verbalizes understanding of discharge instructions. Opportunity for questioning and answers were provided. Armband removed by staff, pt discharged from ED ambulatory.   

## 2018-05-11 NOTE — ED Provider Notes (Signed)
[facility]-EMERGENCY DEPT Provider Note   CSN: [id number] Arrival date & time: 05/11/18  1719    History   Chief Complaint Chief Complaint  Patient presents with  . Dental Pain    HPI PRATIK DALZIEL is a 53 y.o. male.     53 year old male presents with 1 day history of left upper tooth pain.  Subjectively feels that he has an abscess.  Denies any fever or chills.  Is able to swallow okay.  No treatment used prior to arrival.     Past Medical History:  Diagnosis Date  . Anginal pain (HCC)   . Coronary artery disease   . GERD (gastroesophageal reflux disease)   . History of acute pancreatitis 04/06/2015  . NSTEMI (non-ST elevated myocardial infarction) (HCC) 01/2014  . Pancreatitis   . PTSD (post-traumatic stress disorder)   . Tobacco abuse     Patient Active Problem List   Diagnosis Date Noted  . Alcohol use disorder, severe, dependence (HCC) 05/03/2018  . Major depressive disorder, recurrent severe without psychotic features (HCC) 05/03/2018  . Sepsis (HCC) 01/05/2018  . Cellulitis 01/04/2018  . PVD (peripheral vascular disease) (HCC) 01/04/2018  . HTN (hypertension) 01/04/2018  . HLD (hyperlipidemia) 01/04/2018  . GERD (gastroesophageal reflux disease) 01/04/2018  . Peripheral vascular disease of extremity with claudication (HCC) 11/06/2017  . Chest pain 11/06/2017  . Aortic atherosclerosis (HCC) 04/10/2017  . Patient's noncompliance with other medical treatment and regimen 04/10/2017  . CAD (coronary artery disease), native coronary artery 03/15/2016  . Pancreatic divisum 04/18/2015  . History of acute pancreatitis 04/06/2015  . Alcohol abuse 03/09/2015  . Fatty liver 03/09/2015  . Unstable angina (HCC) 01/18/2014  . Tobacco abuse     Past Surgical History:  Procedure Laterality Date  . ABDOMINAL AORTOGRAM N/A 12/31/2017   Procedure: ABDOMINAL AORTOGRAM;  Surgeon: Nada Libman, MD;  Location: [facility];  Service:  Cardiovascular;  Laterality: N/A;  . ABDOMINAL AORTOGRAM W/LOWER EXTREMITY N/A 11/06/2017   Procedure: ABDOMINAL AORTOGRAM W/LOWER EXTREMITY;  Surgeon: Kathleene Hazel, MD;  Location: [facility];  Service: Cardiovascular;  Laterality: N/A;  . CARDIAC CATHETERIZATION N/A 03/14/2016   Procedure: Left Heart Cath and Coronary Angiography;  Surgeon: Kathleene Hazel, MD;  Location: [facility] INVASIVE CV LAB;  Service: Cardiovascular;  Laterality: N/A;  . CORONARY ANGIOPLASTY WITH STENT PLACEMENT  01/18/2014   "1"  . CORONARY ARTERY BYPASS GRAFT N/A 03/19/2016   Procedure: CORONARY ARTERY BYPASS GRAFTING (CABG), ON PUMP, TIMES FIVE, USING LEFT INTERNAL MAMMARY ARTERY AND RIGHT GREATER SAPHENOUS VEIN HARVESTED ENDOSCOPICALLY;  Surgeon: Delight Ovens, MD;  Location: [facility] OR;  Service: Open Heart Surgery;  Laterality: N/A;  LIMA-LAD SVG-OM SVG-DIAG SEQ SVG-PD-PL  . CORONARY BALLOON ANGIOPLASTY N/A 04/11/2017   Procedure: CORONARY BALLOON ANGIOPLASTY;  Surgeon: Yvonne Kendall, MD;  Location: [facility];  Service: Cardiovascular;  Laterality: N/A;  Distal RCA  . CORONARY STENT INTERVENTION N/A 11/06/2017   Procedure: CORONARY STENT INTERVENTION;  Surgeon: Kathleene Hazel, MD;  Location: [facility];  Service: Cardiovascular;  Laterality: N/A;  . GROIN DEBRIDEMENT Right 01/07/2018   Procedure: INCISION AND DEBRIDEMENT OF right GROIN WOUND;  Surgeon: Chuck Hint, MD;  Location[facility];  Service: Vascular;  Laterality: Right;  . INTRAVASCULAR PRESSURE WIRE/FFR STUDY N/A 04/11/2017   Procedure: INTRAVASCULAR PRESSURE WIRE/FFR STUDY;  Surgeon: Yvonne Kendall, MD;  Location: [facility];  Service: Cardiovascular;  Laterality: N/A;  . LEFT  HEART CATH AND CORS/GRAFTS ANGIOGRAPHY N/A 04/11/2017   Procedure: LEFT HEART CATH AND CORS/GRAFTS ANGIOGRAPHY;  Surgeon: Yvonne KendallEnd, Christopher, MD;  Location: [facility];  Service: Cardiovascular;  Laterality: N/A;  . LEFT  HEART CATH AND CORS/GRAFTS ANGIOGRAPHY N/A 11/06/2017   Procedure: LEFT HEART CATH AND CORS/GRAFTS ANGIOGRAPHY;  Surgeon: Kathleene HazelMcAlhany, Christopher D, MD;  Location: [facility];  Service: Cardiovascular;  Laterality: N/A;  . LEFT HEART CATHETERIZATION WITH CORONARY ANGIOGRAM N/A 01/18/2014   Procedure: LEFT HEART CATHETERIZATION WITH CORONARY ANGIOGRAM;  Surgeon: Runell GessJonathan J Berry, MD;  [facility];  Service: Cardiovascular;  Laterality: N/A;  . LOWER EXTREMITY ANGIOGRAPHY Bilateral 12/31/2017   Procedure: Lower Extremity Angiography;  Surgeon: Nada LibmanBrabham, Vance W, MD;  Location: [facility];  Service: Cardiovascular;  Laterality: Bilateral;  . PERIPHERAL VASCULAR INTERVENTION Left 12/31/2017   Procedure: PERIPHERAL VASCULAR INTERVENTION;  Surgeon: Nada LibmanBrabham, Vance W, MD;  Location: [facility];  Service: Cardiovascular;  Laterality: Left;  ext iliac  . TEE WITHOUT CARDIOVERSION N/A 03/19/2016   Procedure: TRANSESOPHAGEAL ECHOCARDIOGRAM (TEE);  Surgeon: Delight OvensEdward B Gerhardt, MD;  [facility];  Service: Open Heart Surgery;  Laterality: N/A;        Home Medications    Prior to Admission medications   Medication Sig Start Date End Date Taking? Authorizing Provider  acetaminophen (TYLENOL) 325 MG tablet Take 2 tablets (650 mg total) by mouth every 6 (six) hours as needed for mild pain, fever or headache. 01/10/18   Hongalgi, Maximino GreenlandAnand D, MD  benzocaine (ORAJEL) 10 % mucosal gel Use as directed in the mouth or throat 2 (two) times daily as needed for mouth pain. 05/09/18   Aldean BakerSykes, Janet E, NP  carvedilol (COREG) 12.5 MG tablet Take 1 tablet (12.5 mg total) by mouth 2 (two) times daily with a meal. For high blood pressure 05/09/18   Aldean BakerSykes, Janet E, NP  clopidogrel (PLAVIX) 75 MG tablet Take 1 tablet (75 mg total) by mouth daily. For antiplatelet 05/10/18   Aldean BakerSykes, Janet E, NP  famotidine (PEPCID) 20 MG tablet Take 1 tablet (20 mg total) by mouth 2 (two) times daily. 05/10/18   Bethann BerkshireZammit,  Joseph, MD  famotidine (PEPCID) 20 MG tablet Take 1 tablet (20 mg total) by mouth daily. For heartburn 05/10/18   Bethann BerkshireZammit, Joseph, MD  FLUoxetine (PROZAC) 20 MG capsule Take 1 capsule (20 mg total) by mouth daily. For mood 05/10/18   Aldean BakerSykes, Janet E, NP  hydrOXYzine (ATARAX/VISTARIL) 50 MG tablet Take 1 tablet (50 mg total) by mouth 3 (three) times daily as needed for itching, anxiety or nausea. 05/09/18   Aldean BakerSykes, Janet E, NP  Multiple Vitamin (MULTIVITAMIN WITH MINERALS) TABS tablet Take 1 tablet by mouth daily. 05/10/18   Aldean BakerSykes, Janet E, NP  nicotine (NICODERM CQ - DOSED IN MG/24 HOURS) 21 mg/24hr patch Place 1 patch (21 mg total) onto the skin daily. For smoking cessation 05/10/18   Aldean BakerSykes, Janet E, NP  traZODone (DESYREL) 100 MG tablet Take 1 tablet (100 mg total) by mouth at bedtime as needed for sleep. 05/09/18   Aldean BakerSykes, Janet E, NP    Family History Family History  Problem Relation Age of Onset  . Cancer Mother        Lung cancer  . Coronary artery disease Mother   . Heart failure Maternal Grandmother     Social History Social History   Tobacco Use  . Smoking status: Current Every Day Smoker    Packs/day: 1.00  Years: 31.00    Pack years: 31.00    Types: Cigarettes  . Smokeless tobacco: Never Used  Substance Use Topics  . Alcohol use: Not Currently    Alcohol/week: 1.0 standard drinks    Types: 1 Cans of beer per week    Comment: occ  . Drug use: Not Currently    Types: Marijuana    Comment: denies 06/17/2015     Allergies   Ibuprofen and Shellfish allergy   Review of Systems Review of Systems  All other systems reviewed and are negative.    Physical Exam Updated Vital Signs BP (!) 158/98 (BP Location: Left Arm)   Pulse 98   Temp 98.5 F (36.9 C) (Oral)   Resp 19   SpO2 98%   Physical Exam Vitals signs and nursing note reviewed.  Constitutional:      Appearance: He is well-developed. He is not toxic-appearing.  HENT:     Head: Normocephalic and atraumatic.       Comments: Poor dentition appreciated throughout.  No obvious evidence of abscess.  No signs of Ludwig angina Eyes:     Conjunctiva/sclera: Conjunctivae normal.     Pupils: Pupils are equal, round, and reactive to light.  Neck:     Musculoskeletal: Normal range of motion.  Cardiovascular:     Rate and Rhythm: Normal rate.  Pulmonary:     Effort: Pulmonary effort is normal.  Skin:    General: Skin is warm and dry.  Neurological:     Mental Status: He is alert and oriented to person, place, and time.      ED Treatments / Results  Labs (all labs ordered are listed, but only abnormal results are displayed) Labs Reviewed - No data to display  EKG None  Radiology Dg Chest 2 View  Result Date: 05/10/2018 CLINICAL DATA:  Central chest pain radiating to the left chest and left arm. Cough for 2 weeks. Smoker. EXAM: CHEST - 2 VIEW COMPARISON:  05/09/2018 FINDINGS: Postoperative changes in the mediastinum. Normal heart size and pulmonary vascularity. Coronary artery stent. Emphysematous changes in the lungs. Calcified granulomas in the right lung. Lungs are otherwise clear and expanded. No blunting of costophrenic angles. No pneumothorax. IMPRESSION: Emphysematous changes in the lungs. No evidence of active pulmonary disease. Electronically Signed   By: Burman Nieves M.D.   On: 05/10/2018 23:42   Dg Chest Port 1 View  Result Date: 05/09/2018 CLINICAL DATA:  Initial evaluation for acute chest pain. EXAM: PORTABLE CHEST 1 VIEW COMPARISON:  Prior radiograph from 05/02/2018. FINDINGS: Median sternotomy wires with sequelae of prior CABG. Lungs are hypoinflated with associated mild left basilar subsegmental atelectasis. No airspace consolidation, pleural effusion, or pulmonary edema is identified. There is no pneumothorax. No acute osseous abnormality. IMPRESSION: 1. Shallow lung inflation with associated mild left basilar subsegmental atelectasis. 2. No other active cardiopulmonary disease. 3.  Sequelae of prior CABG. Electronically Signed   By: Rise Mu M.D.   On: 05/09/2018 22:44    Procedures Procedures (including critical care time)  Medications Ordered in ED Medications - No data to display   Initial Impression / Assessment and Plan / ED Course  I have reviewed the triage vital signs and the nursing notes.  Pertinent labs & imaging results that were available during my care of the patient were reviewed by me and considered in my medical decision making (see chart for details).        Give patient prescription for penicillin and return precautions  Final Clinical Impressions(s) / ED Diagnoses   Final diagnoses:  None    ED Discharge Orders    None       Lorre Nick, MD 05/11/18 1948

## 2018-05-11 NOTE — Discharge Instructions (Addendum)
Return if pain comes back and is not relieved by nitroglycerin.
